# Patient Record
Sex: Female | Born: 1935
Health system: Southern US, Community
[De-identification: ages and names within clinical notes are randomized; demographics above are authoritative.]

## PROBLEM LIST (undated history)

## (undated) DIAGNOSIS — G250 Essential tremor: Secondary | ICD-10-CM

## (undated) DIAGNOSIS — G43909 Migraine, unspecified, not intractable, without status migrainosus: Secondary | ICD-10-CM

## (undated) DIAGNOSIS — F419 Anxiety disorder, unspecified: Secondary | ICD-10-CM

## (undated) DIAGNOSIS — G243 Spasmodic torticollis: Secondary | ICD-10-CM

## (undated) DIAGNOSIS — M5481 Occipital neuralgia: Secondary | ICD-10-CM

## (undated) DIAGNOSIS — R413 Other amnesia: Secondary | ICD-10-CM

## (undated) DIAGNOSIS — K922 Gastrointestinal hemorrhage, unspecified: Secondary | ICD-10-CM

## (undated) HISTORY — PX: REPLACEMENT TOTAL KNEE: SUR1224

## (undated) HISTORY — DX: Occipital neuralgia: M54.81

## (undated) HISTORY — PX: BOWEL RESECTION: SHX1257

## (undated) HISTORY — PX: PACEMAKER IMPLANT: EP1218

## (undated) HISTORY — DX: Spasmodic torticollis: G24.3

## (undated) HISTORY — DX: Other amnesia: R41.3

## (undated) HISTORY — DX: Migraine, unspecified, not intractable, without status migrainosus: G43.909

## (undated) HISTORY — DX: Essential tremor: G25.0

## (undated) HISTORY — DX: Anxiety disorder, unspecified: F41.9

---

## 2011-09-15 DIAGNOSIS — Z8679 Personal history of other diseases of the circulatory system: Secondary | ICD-10-CM | POA: Insufficient documentation

## 2012-03-01 DIAGNOSIS — I10 Essential (primary) hypertension: Secondary | ICD-10-CM | POA: Insufficient documentation

## 2014-11-26 DIAGNOSIS — M25562 Pain in left knee: Secondary | ICD-10-CM | POA: Insufficient documentation

## 2014-11-26 DIAGNOSIS — G8929 Other chronic pain: Secondary | ICD-10-CM | POA: Insufficient documentation

## 2014-12-27 HISTORY — PX: CARDIAC CATHETERIZATION: SHX172

## 2016-09-29 DIAGNOSIS — M722 Plantar fascial fibromatosis: Secondary | ICD-10-CM | POA: Insufficient documentation

## 2016-11-30 DIAGNOSIS — N183 Chronic kidney disease, stage 3 unspecified: Secondary | ICD-10-CM | POA: Insufficient documentation

## 2016-11-30 DIAGNOSIS — K5730 Diverticulosis of large intestine without perforation or abscess without bleeding: Secondary | ICD-10-CM | POA: Insufficient documentation

## 2017-01-19 DIAGNOSIS — M47812 Spondylosis without myelopathy or radiculopathy, cervical region: Secondary | ICD-10-CM | POA: Insufficient documentation

## 2018-06-01 DIAGNOSIS — R251 Tremor, unspecified: Secondary | ICD-10-CM | POA: Insufficient documentation

## 2018-06-01 DIAGNOSIS — M1990 Unspecified osteoarthritis, unspecified site: Secondary | ICD-10-CM | POA: Insufficient documentation

## 2018-07-13 DIAGNOSIS — M542 Cervicalgia: Secondary | ICD-10-CM | POA: Insufficient documentation

## 2018-11-20 DIAGNOSIS — Z96652 Presence of left artificial knee joint: Secondary | ICD-10-CM | POA: Insufficient documentation

## 2019-05-22 DIAGNOSIS — D509 Iron deficiency anemia, unspecified: Secondary | ICD-10-CM | POA: Insufficient documentation

## 2019-10-30 DIAGNOSIS — Z9151 Personal history of suicidal behavior: Secondary | ICD-10-CM | POA: Insufficient documentation

## 2019-10-31 DIAGNOSIS — F323 Major depressive disorder, single episode, severe with psychotic features: Secondary | ICD-10-CM | POA: Insufficient documentation

## 2020-02-04 ENCOUNTER — Other Ambulatory Visit: Payer: Self-pay

## 2020-02-04 ENCOUNTER — Ambulatory Visit (INDEPENDENT_AMBULATORY_CARE_PROVIDER_SITE_OTHER): Payer: Medicare Other | Admitting: Neurology

## 2020-02-04 ENCOUNTER — Encounter: Payer: Self-pay | Admitting: Neurology

## 2020-02-04 VITALS — BP 134/90 | HR 90 | Temp 97.4°F | Ht 64.0 in | Wt 139.0 lb

## 2020-02-04 DIAGNOSIS — R269 Unspecified abnormalities of gait and mobility: Secondary | ICD-10-CM | POA: Insufficient documentation

## 2020-02-04 DIAGNOSIS — R413 Other amnesia: Secondary | ICD-10-CM | POA: Insufficient documentation

## 2020-02-04 DIAGNOSIS — F329 Major depressive disorder, single episode, unspecified: Secondary | ICD-10-CM | POA: Diagnosis not present

## 2020-02-04 DIAGNOSIS — R410 Disorientation, unspecified: Secondary | ICD-10-CM | POA: Diagnosis not present

## 2020-02-04 DIAGNOSIS — G3184 Mild cognitive impairment, so stated: Secondary | ICD-10-CM | POA: Insufficient documentation

## 2020-02-04 DIAGNOSIS — F32A Depression, unspecified: Secondary | ICD-10-CM

## 2020-02-04 MED ORDER — MEMANTINE HCL 10 MG PO TABS: 10.0000 mg | ORAL_TABLET | Freq: Two times a day (BID) | ORAL | 11 refills | Status: DC

## 2020-02-04 NOTE — Progress Notes (Signed)
PATIENT: Regina Hernandez DOB: 1934/08/21  Chief Complaint  Patient presents with  . Establishing neurology care    She is here with her daughter, [name]. MMSE 27/30 - 7 animals. Previously seeing nerurolgy for memory loss, migraines, cervical dystonia, occiptial neuralgia, tremors.  Marland Kitchen PCP    Alma Friendly, MD  . Referred from neurology    Lawrence Marseilles, MD (lives in this area now)     HISTORICAL  Regina Hernandez is a 86 year old female, seen in request by her primary care physician Alma Friendly, accompanied by her daughter [name] for evaluation of constellation of complaints, try to establish neurology care here on February 04, 2020.  I have reviewed and summarized the referring note from the referring physician.  She had past medical history of pacemaker placement.  I was able to review extensive previous records from [facility] neurology by Doctor Lawrence Marseilles, Brooke Bonito, most recent visit was on June 13, 2018, she was seen for occipital neuralgia, migraine, memory loss, cervical dystonia, also tremor, likely to be essential tremor, had occipital nerve block in the past, which was not helpful, started on primidone 50 mg every night, increase to 2 tablets every night, was offered Vicodin 5 325 mg 6 tablets for occipital neuralgia  Patient had longstanding history of depression, lost her husband in 2019, in December 2020, she overdosed herself on 1 bottle of Ambien, was admitted at [facility],  She now lives with her 2 daughters, the most bothersome symptoms for her is memory loss, mildly unsteady gait.  She retired from daycare job, used to enjoying working the yard, was noted to have gradual onset of memory loss since 2019, getting worse since December 2020, she forgets dates, has difficulty operating TV remote control, microwave, and other household appliances, she hopes to receive treatment for her migraine,  She is on polypharmacy treatment, including primidone 50 mg 3 tablets every  night for tremor, Topamax 50 mg 3 times a day for migraine headaches, gabapentin 400 mg 2 tablets 3 times a day for chronic pain, and also Risperdal 2 mg at bedtime, Effexor 75 mg daily, fluvoxamine 50 mg twice a day,   She has a long history of migraine headaches, overall has been improved,    REVIEW OF SYSTEMS: Full 14 system review of systems performed and notable only for as above All other review of systems were negative.  ALLERGIES: Allergies  Allergen Reactions  . Adhesive [Tape] Other (See Comments)    Contact dermatitis  . Lyrica [Pregabalin] Other (See Comments)    Unable to remember reaction.  . Baclofen Rash  . Sulfa Antibiotics Rash    HOME MEDICATIONS: Current Outpatient Medications  Medication Sig Dispense Refill  . ASPIRIN 81 PO Take 1 tablet by mouth daily.    . Cholecalciferol (VITAMIN D3 PO) Take 25 mcg by mouth daily.    Mariane Baumgarten Calcium (STOOL SOFTENER PO) Take by mouth as needed.    Marland Kitchen estradiol (ESTRACE) 0.1 MG/GM vaginal cream Place 1 Applicatorful vaginally as needed.    . fluvoxaMINE (LUVOX) 50 MG tablet Take 50 mg by mouth 2 (two) times daily.    Marland Kitchen gabapentin (NEURONTIN) 400 MG capsule Take 800 mg by mouth 3 (three) times daily.    . meloxicam (MOBIC) 7.5 MG tablet Take 7.5 mg by mouth 2 (two) times daily.    . nitrofurantoin (MACRODANTIN) 100 MG capsule Take 100 mg by mouth at bedtime.    . primidone (MYSOLINE) 50 MG tablet Take 150 mg by  mouth at bedtime.    . risperiDONE (RISPERDAL) 2 MG tablet Take 2 mg by mouth at bedtime.    . sucralfate (CARAFATE) 1 g tablet Take 1 g by mouth 3 (three) times daily. May take up to four times daily.    Marland Kitchen topiramate (TOPAMAX) 50 MG tablet Take 50 mg by mouth 3 (three) times daily.    Marland Kitchen venlafaxine (EFFEXOR) 75 MG tablet Take 75 mg by mouth daily.    . vitamin B-12 (CYANOCOBALAMIN) 1000 MCG tablet Take 1,000 mcg by mouth daily.     No current facility-administered medications for this visit.    PAST MEDICAL  HISTORY: Past Medical History:  Diagnosis Date  . Anxiety   . Cervical dystonia   . Essential tremor   . Memory loss   . Migraine   . Occipital neuralgia     PAST SURGICAL HISTORY: Past Surgical History:  Procedure Laterality Date  . BOWEL RESECTION    . PACEMAKER IMPLANT    . REPLACEMENT TOTAL KNEE Left     FAMILY HISTORY: Family History  Problem Relation Age of Onset  . Other Mother        "old age"  . Ulcers Father   . Stomach cancer Brother     SOCIAL HISTORY: Social History   Socioeconomic History  . Marital status: Widowed    Spouse name: Not on file  . Number of children: 5  . Years of education: 12th  . Highest education level: High school graduate  Occupational History  . Occupation: Retired  Tobacco Use  . Smoking status: Never Smoker  . Smokeless tobacco: Never Used  Substance and Sexual Activity  . Alcohol use: Not Currently  . Drug use: Never  . Sexual activity: Not on file  Other Topics Concern  . Not on file  Social History Narrative   Lives at home with her daughters.   Right-handed.   Two cups caffeine per day.   Social Determinants of Health   Financial Resource Strain:   . Difficulty of Paying Living Expenses: Not on file  Food Insecurity:   . Worried About Charity fundraiser in the Last Year: Not on file  . Ran Out of Food in the Last Year: Not on file  Transportation Needs:   . Lack of Transportation (Medical): Not on file  . Lack of Transportation (Non-Medical): Not on file  Physical Activity:   . Days of Exercise per Week: Not on file  . Minutes of Exercise per Session: Not on file  Stress:   . Feeling of Stress : Not on file  Social Connections:   . Frequency of Communication with Friends and Family: Not on file  . Frequency of Social Gatherings with Friends and Family: Not on file  . Attends Religious Services: Not on file  . Active Member of Clubs or Organizations: Not on file  . Attends Archivist Meetings:  Not on file  . Marital Status: Not on file  Intimate Partner Violence:   . Fear of Current or Ex-Partner: Not on file  . Emotionally Abused: Not on file  . Physically Abused: Not on file  . Sexually Abused: Not on file     PHYSICAL EXAM   Vitals:   02/04/20 1311  BP: 134/90  Pulse: 90  Temp: (!) 97.4 F (36.3 C)  Weight: 139 lb (63 kg)  Height: 5\' 4"  (1.626 m)    Not recorded      Body mass index is  23.86 kg/m.  PHYSICAL EXAMNIATION:  Gen: NAD, conversant, well nourised, well groomed                     Cardiovascular: Regular rate rhythm, no peripheral edema, warm, nontender. Eyes: Conjunctivae clear without exudates or hemorrhage Neck: Supple, no carotid bruits. Pulmonary: Clear to auscultation bilaterally   NEUROLOGICAL EXAM: She has frequent pause, staring during interview MMSE - Mini Mental State Exam 02/04/2020  Orientation to time 4  Orientation to Place 4  Registration 3  Attention/ Calculation 5  Recall 2  Language- name 2 objects 2  Language- repeat 1  Language- follow 3 step command 3  Language- read & follow direction 1  Write a sentence 1  Copy design 1  Total score 27  animal naming 7 CRANIAL NERVES: CN II: Visual fields are full to confrontation. Pupils are round equal and briskly reactive to light. CN III, IV, VI: extraocular movement are normal. No ptosis. CN V: Facial sensation is intact to light touch CN VII: Face is symmetric with normal eye closure  CN VIII: Hearing is normal to causal conversation. CN IX, X: Phonation is normal. CN XI: Head turning and shoulder shrug are intact  MOTOR: There is no pronator drift of out-stretched arms. Muscle bulk and tone are normal. Muscle strength is normal.  REFLEXES: Reflexes are 2+ and symmetric at the biceps, triceps, knees, and ankles. Plantar responses are flexor.  SENSORY: Intact to light touch, pinprick and vibratory sensation are intact in fingers and toes.  COORDINATION: There is no  trunk or limb dysmetria noted.  GAIT/STANCE: She needs push-up to get up from seated position, mildly unsteady,  DIAGNOSTIC DATA (LABS, IMAGING, TESTING) - I reviewed patient records, labs, notes, testing and imaging myself where available.   ASSESSMENT AND PLAN  Regina Hernandez is a 86 y.o. female   Mild cognitive impairment  Mini-Mental Status Examination 27 out of 30  Not MRI candidate due to history of pacemaker, proceed with CT head without contrast  Laboratory evaluation to rule out treatable etiology  Starting Namenda 10 mg twice a day  Gait abnormality  Likely due to combination of aging, deconditioning, polypharmacy treatment, right knee replacement  Essential tremor  Is taking primidone 50 mg 3 tablets every night  Depression anxiety, chronic pain, polypharmacy treatment  She is on fluvoxamine 50 mg twice daily, Effexor 75 mg daily, Risperdal 2 mg at bedtime, gabapentin 400 mg 2 capsule 3 times a day, topiramate 50 mg 1 tablet 3 times a day  I have suggested her taper off Topamax, gabapentin and primidone gradually to avoid the central nervous system side effect     Marcial Pacas, M.D. Ph.D.  [facility] [address], [place], [place] [address] Ph: [phone] Fax: [phone]  CC: Alma Friendly, MD

## 2020-02-05 ENCOUNTER — Telehealth: Payer: Self-pay | Admitting: Neurology

## 2020-02-05 ENCOUNTER — Telehealth: Payer: Self-pay | Admitting: *Deleted

## 2020-02-05 LAB — COMPREHENSIVE METABOLIC PANEL
ALT: 11 IU/L (ref 0–32)
AST: 16 IU/L (ref 0–40)
Albumin/Globulin Ratio: 1.7 (ref 1.2–2.2)
Albumin: 4.3 g/dL (ref 3.6–4.6)
Alkaline Phosphatase: 68 IU/L (ref 39–117)
BUN/Creatinine Ratio: 17 (ref 12–28)
BUN: 17 mg/dL (ref 8–27)
Bilirubin Total: 0.2 mg/dL (ref 0.0–1.2)
CO2: 24 mmol/L (ref 20–29)
Calcium: 10.3 mg/dL (ref 8.7–10.3)
Chloride: 109 mmol/L — ABNORMAL HIGH (ref 96–106)
Creatinine, Ser: 1 mg/dL (ref 0.57–1.00)
GFR calc Af Amer: 60 mL/min/{1.73_m2} (ref >59)
GFR calc non Af Amer: 52 mL/min/{1.73_m2} — ABNORMAL LOW (ref >59)
Globulin, Total: 2.5 g/dL (ref 1.5–4.5)
Glucose: 92 mg/dL (ref 65–99)
Potassium: 4.9 mmol/L (ref 3.5–5.2)
Sodium: 144 mmol/L (ref 134–144)
Total Protein: 6.8 g/dL (ref 6.0–8.5)

## 2020-02-05 LAB — CBC WITH DIFFERENTIAL
Basophils Absolute: 0.1 10*3/uL (ref 0.0–0.2)
Basos: 1 %
EOS (ABSOLUTE): 0.3 10*3/uL (ref 0.0–0.4)
Eos: 4 %
Hematocrit: 39.4 % (ref 34.0–46.6)
Hemoglobin: 12.8 g/dL (ref 11.1–15.9)
Immature Grans (Abs): 0 10*3/uL (ref 0.0–0.1)
Immature Granulocytes: 0 %
Lymphocytes Absolute: 2.8 10*3/uL (ref 0.7–3.1)
Lymphs: 39 %
MCH: 30.3 pg (ref 26.6–33.0)
MCHC: 32.5 g/dL (ref 31.5–35.7)
MCV: 93 fL (ref 79–97)
Monocytes Absolute: 0.6 10*3/uL (ref 0.1–0.9)
Monocytes: 9 %
Neutrophils Absolute: 3.4 10*3/uL (ref 1.4–7.0)
Neutrophils: 47 %
RBC: 4.23 x10E6/uL (ref 3.77–5.28)
RDW: 11.7 % (ref 11.7–15.4)
WBC: 7.2 10*3/uL (ref 3.4–10.8)

## 2020-02-05 LAB — TSH: TSH: 1.71 u[IU]/mL (ref 0.450–4.500)

## 2020-02-05 LAB — RPR: RPR Ser Ql: NONREACTIVE

## 2020-02-05 LAB — VITAMIN B12: Vitamin B-12: 893 pg/mL (ref 232–1245)

## 2020-02-05 NOTE — Telephone Encounter (Signed)
I spoke to her daughter, [name], who verbalized understanding of the lab results.

## 2020-02-05 NOTE — Telephone Encounter (Signed)
Medicare/[place] order sent to GI. No auth they will reach out to the patient to schedule.  

## 2020-02-05 NOTE — Telephone Encounter (Signed)
-----   Message from Marcial Pacas, MD sent at 02/05/2020  1:57 PM EST ----- Please call patient laboratory evaluation showed no significant abnormalities

## 2020-02-06 ENCOUNTER — Ambulatory Visit: Payer: Self-pay | Admitting: Family Medicine

## 2020-02-11 ENCOUNTER — Other Ambulatory Visit: Payer: Self-pay

## 2020-02-11 ENCOUNTER — Encounter: Payer: Self-pay | Admitting: Family Medicine

## 2020-02-11 ENCOUNTER — Ambulatory Visit (INDEPENDENT_AMBULATORY_CARE_PROVIDER_SITE_OTHER): Payer: Medicare Other | Admitting: Family Medicine

## 2020-02-11 VITALS — BP 102/64 | HR 76 | Temp 97.8°F | Ht 64.0 in | Wt 140.8 lb

## 2020-02-11 DIAGNOSIS — N390 Urinary tract infection, site not specified: Secondary | ICD-10-CM | POA: Diagnosis not present

## 2020-02-11 DIAGNOSIS — Z8679 Personal history of other diseases of the circulatory system: Secondary | ICD-10-CM | POA: Diagnosis not present

## 2020-02-11 DIAGNOSIS — Z950 Presence of cardiac pacemaker: Secondary | ICD-10-CM

## 2020-02-11 DIAGNOSIS — F323 Major depressive disorder, single episode, severe with psychotic features: Secondary | ICD-10-CM

## 2020-02-11 DIAGNOSIS — Z7689 Persons encountering health services in other specified circumstances: Secondary | ICD-10-CM | POA: Diagnosis not present

## 2020-02-11 DIAGNOSIS — Z8619 Personal history of other infectious and parasitic diseases: Secondary | ICD-10-CM | POA: Insufficient documentation

## 2020-02-11 DIAGNOSIS — M533 Sacrococcygeal disorders, not elsewhere classified: Secondary | ICD-10-CM | POA: Insufficient documentation

## 2020-02-11 DIAGNOSIS — N1831 Chronic kidney disease, stage 3a: Secondary | ICD-10-CM

## 2020-02-11 DIAGNOSIS — R42 Dizziness and giddiness: Secondary | ICD-10-CM | POA: Insufficient documentation

## 2020-02-11 DIAGNOSIS — R519 Headache, unspecified: Secondary | ICD-10-CM | POA: Insufficient documentation

## 2020-02-11 DIAGNOSIS — G930 Cerebral cysts: Secondary | ICD-10-CM | POA: Insufficient documentation

## 2020-02-11 DIAGNOSIS — G43909 Migraine, unspecified, not intractable, without status migrainosus: Secondary | ICD-10-CM | POA: Insufficient documentation

## 2020-02-11 NOTE — Patient Instructions (Signed)
Good to see you today  Please follow up in 6 months  You will get a call about urology and cardiology appointments

## 2020-02-11 NOTE — Progress Notes (Signed)
Subjective:    Patient ID: Regina Hernandez, female    DOB: December 14, 1934, 86 y.o.   MRN: [id number]  HPI Chief Complaint  Patient presents with  . New Patient (Initial Visit)    Establish Care   This is an 86 yo female who presents today to establish care. She is accompanied by her daughter. Two of her daughters manage her care. She lives with her other daughter. Husband passed away last year. Enjoys puzzles, watching TV.   Last CPE- 12/11/19 Mammo- [id number] Pap- Not applicable Colonoscopy- will get records Tdap-09/23/2018 Flu- annual Pneumonia- reports that she has had, will check pcp records Exercise- walks on deck when weather is nice.   Has history of bowel resection. Feels like food is not fully digested. Usually has daily BM with daily stool softener and 2 cups of coffee. Also supplements with Boost. Bowel movements hard. Drinks 3-4 glasses of water daily.   Stage 4 kidney disease.   Neurology- recent visit with Dr. Krista Blue. Headaches- mostly sinus headaches, some migraine headache. Good relief with Dristan Sinus med at night.   Mood is improved. She is seeing Dr. Casimiro Needle psychiatrist.    Review of Systems  Constitutional: Negative.   HENT: Negative.   Respiratory: Negative.   Cardiovascular: Positive for leg swelling (if she doesn't elevate legs while sitting).  Musculoskeletal: Positive for gait problem (uses cane).       Left knee pain.   Neurological: Positive for headaches (chronic). Negative for weakness.  Psychiatric/Behavioral: Positive for dysphoric mood (improving).       Objective:   Physical Exam Vitals reviewed.  Constitutional:      General: She is not in acute distress.    Appearance: Normal appearance. She is normal weight. She is not ill-appearing, toxic-appearing or diaphoretic.  HENT:     Head: Normocephalic and atraumatic.  Eyes:     Conjunctiva/sclera: Conjunctivae normal.  Cardiovascular:     Rate and Rhythm: Normal rate and regular rhythm.   Heart sounds: Normal heart sounds.  Pulmonary:     Effort: Pulmonary effort is normal.     Breath sounds: Normal breath sounds.  Musculoskeletal:     Right lower leg: Edema (trace) present.     Left lower leg: Edema (trace) present.  Skin:    General: Skin is warm and dry.  Neurological:     Mental Status: She is alert.     Comments: Normally conversive, answers questions appropriately.   Psychiatric:        Mood and Affect: Mood normal.        Behavior: Behavior normal.        Thought Content: Thought content normal.        Judgment: Judgment normal.       BP 102/64 (BP Location: Left Arm, Patient Position: Sitting, Cuff Size: Normal)   Pulse 76   Temp 97.8 F (36.6 C) (Temporal)   Ht 5\' 4"  (1.626 m)   Wt 140 lb 12.8 oz (63.9 kg)   SpO2 96%   BMI 24.17 kg/m  Wt Readings from Last 3 Encounters:  02/11/20 140 lb 12.8 oz (63.9 kg)  02/04/20 139 lb (63 kg)   Depression screen PHQ 2/9 02/11/2020  Decreased Interest 0  Down, Depressed, Hopeless 0  PHQ - 2 Score 0       Assessment & Plan:  1. Encounter to establish care - EMR reviewed   2. Frequent UTI - patient previously seen by urology, requests referral to local urologist -  Ambulatory referral to Urology  3. Artificial cardiac pacemaker - previously saw cardiologist in [place], requests referral to local cardiologist - Ambulatory referral to Cardiology  4. History of sick sinus syndrome - Ambulatory referral to Cardiology  5. Major depression with psychotic features [facility]) - currently following with psychiatry, reports that her mood has improved - good family support  6. Stage 3a chronic kidney disease - stable - Meloxicam on her medication list, encouraged her to discontinue daily use and try Tylenol instead  - follow up in 6 months  This visit occurred during the SARS-CoV-2 public health emergency.  Safety protocols were in place, including screening questions prior to the visit, additional usage  of staff PPE, and extensive cleaning of exam room while observing appropriate contact time as indicated for disinfecting solutions.    Clarene Reamer, FNP-BC  [facility], [facility]  02/11/2020 10:14 AM

## 2020-02-15 ENCOUNTER — Ambulatory Visit
Admission: RE | Admit: 2020-02-15 | Discharge: 2020-02-15 | Disposition: A | Discharge status: Home / Self Care | Payer: Medicare Other | Source: Ambulatory Visit | Attending: Neurology | Admitting: Neurology

## 2020-02-15 DIAGNOSIS — R269 Unspecified abnormalities of gait and mobility: Secondary | ICD-10-CM

## 2020-02-15 DIAGNOSIS — F329 Major depressive disorder, single episode, unspecified: Secondary | ICD-10-CM

## 2020-02-15 DIAGNOSIS — F32A Depression, unspecified: Secondary | ICD-10-CM

## 2020-02-15 DIAGNOSIS — R413 Other amnesia: Secondary | ICD-10-CM | POA: Diagnosis not present

## 2020-02-18 ENCOUNTER — Encounter: Payer: Self-pay | Admitting: Cardiology

## 2020-02-18 ENCOUNTER — Ambulatory Visit (INDEPENDENT_AMBULATORY_CARE_PROVIDER_SITE_OTHER): Payer: Medicare Other | Admitting: Cardiology

## 2020-02-18 ENCOUNTER — Telehealth: Payer: Self-pay | Admitting: Neurology

## 2020-02-18 ENCOUNTER — Other Ambulatory Visit: Payer: Self-pay

## 2020-02-18 VITALS — BP 172/92 | HR 71 | Ht 63.0 in | Wt 137.0 lb

## 2020-02-18 DIAGNOSIS — R030 Elevated blood-pressure reading, without diagnosis of hypertension: Secondary | ICD-10-CM

## 2020-02-18 DIAGNOSIS — Z8679 Personal history of other diseases of the circulatory system: Secondary | ICD-10-CM

## 2020-02-18 MED ORDER — PRIMIDONE 50 MG PO TABS: 150.0000 mg | ORAL_TABLET | Freq: Every day | ORAL | 1 refills | Status: DC

## 2020-02-18 NOTE — Progress Notes (Signed)
Cardiology Office Note:    Date:  02/18/2020   ID:  Regina Hernandez, Regina Hernandez 01-13-1934, MRN [id number]  PCP:  Elby Beck, FNP  Cardiologist:  Kate Sable, MD  Electrophysiologist:  None   Referring MD: Elby Beck, FNP   Chief Complaint  Patient presents with  . office visit    pt has no complaints. Meds verbally reviewed w/ pt.    History of Present Illness:    Regina Hernandez is a 86 y.o. 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  She was previously seen at [facility] cardiology in [place].  Patient recently moved to the area from [place].  She denies any symptoms of chest pain or shortness of breath at rest or with exertion.  She is able to do all activities of daily living without chest pain or breathing problems.  She ambulates with a walker.  She states having a history of cardiac valve leakage.  Her pacemaker was placed roughly 7 years ago.  Pacemaker lastly interrogated on 01/16/2020, report states pacemaker was functioning appropriately.  Past Medical History:  Diagnosis Date  . Anxiety   . Cervical dystonia   . Essential tremor   . Memory loss   . Migraine   . Occipital neuralgia     Past Surgical History:  Procedure Laterality Date  . BOWEL RESECTION    . PACEMAKER IMPLANT    . REPLACEMENT TOTAL KNEE Left     Current Medications: Current Meds  Medication Sig  . ASPIRIN 81 PO Take 1 tablet by mouth daily.  . Cholecalciferol (VITAMIN D3 PO) Take 25 mcg by mouth daily.  Mariane Baumgarten Calcium (STOOL SOFTENER PO) Take by mouth as needed.  Marland Kitchen estradiol (ESTRACE) 0.1 MG/GM vaginal cream Place 1 Applicatorful vaginally as needed.  . fluvoxaMINE (LUVOX) 50 MG tablet Take 50 mg by mouth 2 (two) times daily.  Marland Kitchen gabapentin (NEURONTIN) 400 MG capsule Take 800 mg by mouth 3 (three) times daily.  . meloxicam (MOBIC) 7.5 MG tablet Take 7.5 mg by mouth 2 (two)  times daily.  . memantine (NAMENDA) 10 MG tablet Take 1 tablet (10 mg total) by mouth 2 (two) times daily.  . nitrofurantoin (MACRODANTIN) 100 MG capsule Take 100 mg by mouth at bedtime.  . primidone (MYSOLINE) 50 MG tablet Take 3 tablets (150 mg total) by mouth at bedtime.  . risperiDONE (RISPERDAL) 2 MG tablet Take 2 mg by mouth at bedtime.  . sucralfate (CARAFATE) 1 g tablet Take 1 g by mouth 3 (three) times daily. May take up to four times daily.  Marland Kitchen topiramate (TOPAMAX) 50 MG tablet Take 50 mg by mouth 3 (three) times daily.  Marland Kitchen venlafaxine (EFFEXOR) 75 MG tablet Take 75 mg by mouth daily.  . vitamin B-12 (CYANOCOBALAMIN) 1000 MCG tablet Take 1,000 mcg by mouth daily.     Allergies:   Adhesive [tape], Lyrica [pregabalin], Baclofen, and Sulfa antibiotics   Social History   Socioeconomic History  . Marital status: Widowed    Spouse name: Not on file  . Number of children: 5  . Years of education: 12th  . Highest education level: High school graduate  Occupational History  . Occupation: Retired  Tobacco Use  . Smoking status: Never Smoker  . Smokeless tobacco: Never Used  Substance and Sexual Activity  . Alcohol use: Not Currently  . Drug use: Never  . Sexual activity: Not on file  Other  Topics Concern  . Not on file  Social History Narrative   Lives at home with her daughters.   Right-handed.   Two cups caffeine per day.   Social Determinants of Health   Financial Resource Strain:   . Difficulty of Paying Living Expenses: Not on file  Food Insecurity:   . Worried About Charity fundraiser in the Last Year: Not on file  . Ran Out of Food in the Last Year: Not on file  Transportation Needs:   . Lack of Transportation (Medical): Not on file  . Lack of Transportation (Non-Medical): Not on file  Physical Activity:   . Days of Exercise per Week: Not on file  . Minutes of Exercise per Session: Not on file  Stress:   . Feeling of Stress : Not on file  Social Connections:    . Frequency of Communication with Friends and Family: Not on file  . Frequency of Social Gatherings with Friends and Family: Not on file  . Attends Religious Services: Not on file  . Active Member of Clubs or Organizations: Not on file  . Attends Archivist Meetings: Not on file  . Marital Status: Not on file     Family History: The patient's family history includes Other in her mother; Stomach cancer in her brother; Ulcers in her father.  ROS:   Please see the history of present illness.     All other systems reviewed and are negative.  EKGs/Labs/Other Studies Reviewed:    The following studies were reviewed today:   EKG:  EKG is  ordered today.  The ekg ordered today demonstrates atrial paced rhythm  Recent Labs: 02/04/2020: ALT 11; BUN 17; Creatinine, Ser 1.00; Hemoglobin 12.8; Potassium 4.9; Sodium 144; TSH 1.710  Recent Lipid Panel No results found for: CHOL, TRIG, HDL, CHOLHDL, VLDL, LDLCALC, LDLDIRECT  Physical Exam:    VS:  BP (!) 172/92 (BP Location: Left Arm, Patient Position: Sitting, Cuff Size: Normal)   Pulse 71   Ht 5\' 3"  (1.6 m)   Wt 137 lb (62.1 kg)   SpO2 97%   BMI 24.27 kg/m     Wt Readings from Last 3 Encounters:  02/18/20 137 lb (62.1 kg)  02/11/20 140 lb 12.8 oz (63.9 kg)  02/04/20 139 lb (63 kg)     GEN:  Well nourished, well developed in no acute distress HEENT: Normal NECK: No JVD; No carotid bruits LYMPHATICS: No lymphadenopathy CARDIAC: RRR, no murmurs, rubs, gallops RESPIRATORY:  Clear to auscultation without rales, wheezing or rhonchi  ABDOMEN: Soft, non-tender, non-distended MUSCULOSKELETAL:  No edema; No deformity  SKIN: Warm and dry NEUROLOGIC:  Alert and oriented x 3 PSYCHIATRIC:  Normal affect   ASSESSMENT:    1. History of sick sinus syndrome   2. Elevated blood pressure reading    PLAN:    In order of problems listed above:  1. Patient with history of bradycardia, sick sinus syndrome status post pacemaker.   We will schedule device clinic for frequent pacemaker checks.  Get echocardiogram to evaluate function in light of history of valvular regurgitation. 2. Blood pressure appears elevated today.  Blood pressure is usually normal at home.  Last blood pressure checks during clinical visits have been normal.  Continue to monitor for now.  Follow-up after echocardiogram.  This note was generated in part or whole with voice recognition software. Voice recognition is usually quite accurate but there are transcription errors that can and very often do occur. I  apologize for any typographical errors that were not detected and corrected.  Medication Adjustments/Labs and Tests Ordered: Current medicines are reviewed at length with the patient today.  Concerns regarding medicines are outlined above.  Orders Placed This Encounter  Procedures  . Ambulatory referral to Cardiac Electrophysiology  . EKG 12-Lead  . ECHOCARDIOGRAM COMPLETE   No orders of the defined types were placed in this encounter.   Patient Instructions  Medication Instructions:  Your physician recommends that you continue on your current medications as directed. Please refer to the Current Medication list given to you today.  *If you need a refill on your cardiac medications before your next appointment, please call your pharmacy*  Lab Work: None ordered If you have labs (blood work) drawn today and your tests are completely normal, you will receive your results only by: Marland Kitchen MyChart Message (if you have MyChart) OR . A paper copy in the mail If you have any lab test that is abnormal or we need to change your treatment, we will call you to review the results.  Testing/Procedures: Your physician has requested that you have an echocardiogram. Echocardiography is a painless test that uses sound waves to create images of your heart. It provides your doctor with information about the size and shape of your heart and how well your heart's  chambers and valves are working. This procedure takes approximately one hour. There are no restrictions for this procedure.    Follow-Up: At [facility], you and your health needs are our priority.  As part of our continuing mission to provide you with exceptional heart care, we have created designated Provider Care Teams.  These Care Teams include your primary Cardiologist (physician) and Advanced Practice Providers (APPs -  Physician Assistants and Nurse Practitioners) who all work together to provide you with the care you need, when you need it.  Your next appointment:   After the echo with Dr. Brett Canales have been referred to EP device clinic   The format for your next appointment:   In Person  Provider:    You may see Kate Sable, MD or one of the following Advanced Practice Providers on your designated Care Team:    Murray Hodgkins, NP  Christell Faith, PA-C  Marrianne Mood, PA-C   Other Instructions  Echocardiogram An echocardiogram is a procedure that uses painless sound waves (ultrasound) to produce an image of the heart. Images from an echocardiogram can provide important information about:  Signs of coronary artery disease (CAD).  Aneurysm detection. An aneurysm is a weak or damaged part of an artery wall that bulges out from the normal force of blood pumping through the body.  Heart size and shape. Changes in the size or shape of the heart can be associated with certain conditions, including heart failure, aneurysm, and CAD.  Heart muscle function.  Heart valve function.  Signs of a past heart attack.  Fluid buildup around the heart.  Thickening of the heart muscle.  A tumor or infectious growth around the heart valves. Tell a health care provider about:  Any allergies you have.  All medicines you are taking, including vitamins, herbs, eye drops, creams, and over-the-counter medicines.  Any blood disorders you have.  Any surgeries you  have had.  Any medical conditions you have.  Whether you are pregnant or may be pregnant. What are the risks? Generally, this is a safe procedure. However, problems may occur, including:  Allergic reaction to dye (contrast) that may be  used during the procedure. What happens before the procedure? No specific preparation is needed. You may eat and drink normally. What happens during the procedure?   An IV tube may be inserted into one of your veins.  You may receive contrast through this tube. A contrast is an injection that improves the quality of the pictures from your heart.  A gel will be applied to your chest.  A wand-like tool (transducer) will be moved over your chest. The gel will help to transmit the sound waves from the transducer.  The sound waves will harmlessly bounce off of your heart to allow the heart images to be captured in real-time motion. The images will be recorded on a computer. The procedure may vary among health care providers and hospitals. What happens after the procedure?  You may return to your normal, everyday life, including diet, activities, and medicines, unless your health care provider tells you not to do that. Summary  An echocardiogram is a procedure that uses painless sound waves (ultrasound) to produce an image of the heart.  Images from an echocardiogram can provide important information about the size and shape of your heart, heart muscle function, heart valve function, and fluid buildup around your heart.  You do not need to do anything to prepare before this procedure. You may eat and drink normally.  After the echocardiogram is completed, you may return to your normal, everyday life, unless your health care provider tells you not to do that. This information is not intended to replace advice given to you by your health care provider. Make sure you discuss any questions you have with your health care provider. Document Revised: 04/05/2019  Document Reviewed: 01/15/2017 Elsevier Patient Education  2020 Botkins, Kate Sable, MD  02/18/2020 12:50 PM    [place]

## 2020-02-18 NOTE — Telephone Encounter (Signed)
Pt has appt pending on 04/22/20.  Order placed for refills.

## 2020-02-18 NOTE — Telephone Encounter (Signed)
I spoke to her daughter, Maurine Minister, on [place]. She verbalized understanding of the CT Head results and will share the information with her mother. Provided our number to call back with any questions.

## 2020-02-18 NOTE — Telephone Encounter (Signed)
Regina Hernandez, Regina Hernandez(daughter) has called for a refill for primidone (MYSOLINE) 50 MG tablet  [facility] #[id number]  For pt.  She is asking for 2 month of refills to hold pt until her April f/u appointment

## 2020-02-18 NOTE — Patient Instructions (Signed)
Medication Instructions:  Your physician recommends that you continue on your current medications as directed. Please refer to the Current Medication list given to you today.  *If you need a refill on your cardiac medications before your next appointment, please call your pharmacy*  Lab Work: None ordered If you have labs (blood work) drawn today and your tests are completely normal, you will receive your results only by: Marland Kitchen MyChart Message (if you have MyChart) OR . A paper copy in the mail If you have any lab test that is abnormal or we need to change your treatment, we will call you to review the results.  Testing/Procedures: Your physician has requested that you have an echocardiogram. Echocardiography is a painless test that uses sound waves to create images of your heart. It provides your doctor with information about the size and shape of your heart and how well your heart's chambers and valves are working. This procedure takes approximately one hour. There are no restrictions for this procedure.    Follow-Up: At [facility], you and your health needs are our priority.  As part of our continuing mission to provide you with exceptional heart care, we have created designated Provider Care Teams.  These Care Teams include your primary Cardiologist (physician) and Advanced Practice Providers (APPs -  Physician Assistants and Nurse Practitioners) who all work together to provide you with the care you need, when you need it.  Your next appointment:   After the echo with Dr. Brett Canales have been referred to EP device clinic   The format for your next appointment:   In Person  Provider:    You may see Kate Sable, MD or one of the following Advanced Practice Providers on your designated Care Team:    Murray Hodgkins, NP  Christell Faith, PA-C  Marrianne Mood, PA-C   Other Instructions  Echocardiogram An echocardiogram is a procedure that uses painless sound waves  (ultrasound) to produce an image of the heart. Images from an echocardiogram can provide important information about:  Signs of coronary artery disease (CAD).  Aneurysm detection. An aneurysm is a weak or damaged part of an artery wall that bulges out from the normal force of blood pumping through the body.  Heart size and shape. Changes in the size or shape of the heart can be associated with certain conditions, including heart failure, aneurysm, and CAD.  Heart muscle function.  Heart valve function.  Signs of a past heart attack.  Fluid buildup around the heart.  Thickening of the heart muscle.  A tumor or infectious growth around the heart valves. Tell a health care provider about:  Any allergies you have.  All medicines you are taking, including vitamins, herbs, eye drops, creams, and over-the-counter medicines.  Any blood disorders you have.  Any surgeries you have had.  Any medical conditions you have.  Whether you are pregnant or may be pregnant. What are the risks? Generally, this is a safe procedure. However, problems may occur, including:  Allergic reaction to dye (contrast) that may be used during the procedure. What happens before the procedure? No specific preparation is needed. You may eat and drink normally. What happens during the procedure?   An IV tube may be inserted into one of your veins.  You may receive contrast through this tube. A contrast is an injection that improves the quality of the pictures from your heart.  A gel will be applied to your chest.  A wand-like tool (transducer)  will be moved over your chest. The gel will help to transmit the sound waves from the transducer.  The sound waves will harmlessly bounce off of your heart to allow the heart images to be captured in real-time motion. The images will be recorded on a computer. The procedure may vary among health care providers and hospitals. What happens after the  procedure?  You may return to your normal, everyday life, including diet, activities, and medicines, unless your health care provider tells you not to do that. Summary  An echocardiogram is a procedure that uses painless sound waves (ultrasound) to produce an image of the heart.  Images from an echocardiogram can provide important information about the size and shape of your heart, heart muscle function, heart valve function, and fluid buildup around your heart.  You do not need to do anything to prepare before this procedure. You may eat and drink normally.  After the echocardiogram is completed, you may return to your normal, everyday life, unless your health care provider tells you not to do that. This information is not intended to replace advice given to you by your health care provider. Make sure you discuss any questions you have with your health care provider. Document Revised: 04/05/2019 Document Reviewed: 01/15/2017 Elsevier Patient Education  [place].

## 2020-02-18 NOTE — Telephone Encounter (Signed)
IMPRESSION: This CT scan of the head without contrast shows the following: 1.   Mild generalized cortical atrophy, typical for age. 2.   Mild deep white matter changes consistent with mild age-appropriate chronic microvascular ischemic changes. 3.   There is a left anterior temporal fossa arachnoid cyst. 4.   Dolichoectasia of the vertebrobasilar system.  5.   No acute findings.   CT head showed no acute abnormality, there is evidence of mild generalized atrophy, supratentorium small vessel disease, in addition, there is a left anterior temporal fossa arachnoid cyst (which is chronic, and benign findings), will review CT scan at next follow-up visit

## 2020-02-18 NOTE — Addendum Note (Signed)
Addended by: Noberto Retort C on: 02/18/2020 12:00 PM   Modules accepted: Orders

## 2020-02-20 ENCOUNTER — Other Ambulatory Visit: Payer: Self-pay

## 2020-02-20 ENCOUNTER — Ambulatory Visit (INDEPENDENT_AMBULATORY_CARE_PROVIDER_SITE_OTHER): Payer: Medicare Other | Admitting: Neurology

## 2020-02-20 DIAGNOSIS — R410 Disorientation, unspecified: Secondary | ICD-10-CM

## 2020-02-20 DIAGNOSIS — R413 Other amnesia: Secondary | ICD-10-CM

## 2020-02-20 DIAGNOSIS — F32A Depression, unspecified: Secondary | ICD-10-CM

## 2020-02-20 DIAGNOSIS — F329 Major depressive disorder, single episode, unspecified: Secondary | ICD-10-CM

## 2020-02-20 DIAGNOSIS — R269 Unspecified abnormalities of gait and mobility: Secondary | ICD-10-CM

## 2020-02-26 ENCOUNTER — Other Ambulatory Visit: Payer: Self-pay | Admitting: Neurology

## 2020-02-29 ENCOUNTER — Other Ambulatory Visit: Payer: Self-pay

## 2020-02-29 ENCOUNTER — Encounter: Payer: Self-pay | Admitting: Urology

## 2020-02-29 ENCOUNTER — Ambulatory Visit (INDEPENDENT_AMBULATORY_CARE_PROVIDER_SITE_OTHER): Payer: Medicare Other | Admitting: Urology

## 2020-02-29 VITALS — BP 166/101 | HR 94 | Ht 63.0 in | Wt 133.0 lb

## 2020-02-29 DIAGNOSIS — N390 Urinary tract infection, site not specified: Secondary | ICD-10-CM

## 2020-02-29 LAB — MICROSCOPIC EXAMINATION

## 2020-02-29 LAB — URINALYSIS, COMPLETE
Bilirubin, UA: NEGATIVE
Glucose, UA: NEGATIVE
Ketones, UA: NEGATIVE
Leukocytes,UA: NEGATIVE
Nitrite, UA: NEGATIVE
Protein,UA: NEGATIVE
Specific Gravity, UA: 1.02 (ref 1.005–1.030)
Urobilinogen, Ur: 0.2 mg/dL (ref 0.2–1.0)
pH, UA: 7 (ref 5.0–7.5)

## 2020-02-29 LAB — BLADDER SCAN AMB NON-IMAGING

## 2020-02-29 MED ORDER — NITROFURANTOIN MACROCRYSTAL 50 MG PO CAPS: 50.0000 mg | ORAL_CAPSULE | Freq: Every day | ORAL | 0 refills | Status: AC

## 2020-02-29 NOTE — Progress Notes (Signed)
02/29/2020 9:42 AM   Regina Hernandez 1934-10-11 [id number]  Referring provider: Elby Beck, [place],  [place] [address]  Chief Complaint  Patient presents with  . Recurrent UTI    HPI: She has a history of recurrent UTI consisting of symptoms of dysuria and bladder pain. She saw Dr. Henreitta Leber. She has no gross hematuria. She gets about 2 UTI per year. Drinking water. She has CKD Stage III. Last Cr was 1 and GFR 52. She was on TMP and now on nightly NF 100 mg. On estradiol vaginal cream.   She has not had any recent issues.  She needed to establish with a new urologist. She has constipation but on bowel regimen. No NG risk.   PVR was 30 ml.    PMH: Past Medical History:  Diagnosis Date  . Anxiety   . Cervical dystonia   . Essential tremor   . Memory loss   . Migraine   . Occipital neuralgia     Surgical History: Past Surgical History:  Procedure Laterality Date  . BOWEL RESECTION    . PACEMAKER IMPLANT    . REPLACEMENT TOTAL KNEE Left     Home Medications:  Allergies as of 02/29/2020      Reactions   Adhesive [tape] Other (See Comments)   Contact dermatitis   Lyrica [pregabalin] Other (See Comments)   Unable to remember reaction.   Baclofen Rash   Sulfa Antibiotics Rash      Medication List       Accurate as of February 29, 2020  9:42 AM. If you have any questions, ask your nurse or doctor.        STOP taking these medications   topiramate 50 MG tablet Commonly known as: TOPAMAX Stopped by: Festus Aloe, MD     TAKE these medications   ASPIRIN 81 PO Take 1 tablet by mouth daily.   estradiol 0.1 MG/GM vaginal cream Commonly known as: ESTRACE Place 1 Applicatorful vaginally as needed.   fluvoxaMINE 50 MG tablet Commonly known as: LUVOX Take 50 mg by mouth 2 (two) times daily.   gabapentin 400 MG capsule Commonly known as: NEURONTIN Take 800 mg by mouth 3 (three) times daily.   meloxicam 7.5 MG tablet Commonly  known as: MOBIC Take 7.5 mg by mouth 2 (two) times daily.   memantine 10 MG tablet Commonly known as: NAMENDA TAKE 1 TABLET BY MOUTH TWICE A DAY   nitrofurantoin 100 MG capsule Commonly known as: MACRODANTIN Take 100 mg by mouth at bedtime.   primidone 50 MG tablet Commonly known as: MYSOLINE Take 3 tablets (150 mg total) by mouth at bedtime.   risperiDONE 2 MG tablet Commonly known as: RISPERDAL Take 2 mg by mouth at bedtime.   STOOL SOFTENER PO Take by mouth as needed.   sucralfate 1 g tablet Commonly known as: CARAFATE Take 1 g by mouth 3 (three) times daily. May take up to four times daily.   venlafaxine 75 MG tablet Commonly known as: EFFEXOR Take 75 mg by mouth daily.   vitamin B-12 1000 MCG tablet Commonly known as: CYANOCOBALAMIN Take 1,000 mcg by mouth daily.   VITAMIN D3 PO Take 25 mcg by mouth daily.       Allergies:  Allergies  Allergen Reactions  . Adhesive [Tape] Other (See Comments)    Contact dermatitis  . Lyrica [Pregabalin] Other (See Comments)    Unable to remember reaction.  . Baclofen Rash  .  Sulfa Antibiotics Rash    Family History: Family History  Problem Relation Age of Onset  . Other Mother        "old age"  . Ulcers Father   . Stomach cancer Brother     Social History:  reports that she has never smoked. She has never used smokeless tobacco. She reports previous alcohol use. She reports that she does not use drugs.   Physical Exam: BP (!) 166/101   Pulse 94   Ht 5\' 3"  (1.6 m)   Wt 133 lb (60.3 kg)   BMI 23.56 kg/m   Constitutional:  Alert and oriented, No acute distress. HEENT: [place] AT, moist mucus membranes.  Trachea midline, no masses. Cardiovascular: No clubbing, cyanosis, or edema. Respiratory: Normal respiratory effort, no increased work of breathing. GI: Abdomen is soft, nontender, nondistended, no abdominal masses Skin: No rashes, bruises or suspicious lesions. Neurologic: Grossly intact, no focal deficits,  moving all 4 extremities. Psychiatric: Normal mood and affect.  Laboratory Data: Lab Results  Component Value Date   WBC 7.2 02/04/2020   HGB 12.8 02/04/2020   HCT 39.4 02/04/2020   MCV 93 02/04/2020    Lab Results  Component Value Date   CREATININE 1.00 02/04/2020    No results found for: PSA  No results found for: TESTOSTERONE  No results found for: HGBA1C  Urinalysis No results found for: COLORURINE, APPEARANCEUR, LABSPEC, PHURINE, GLUCOSEU, HGBUR, BILIRUBINUR, KETONESUR, PROTEINUR, UROBILINOGEN, NITRITE, LEUKOCYTESUR  No results found for: LABMICR, [place], RBCUA, LABEPIT, MUCUS, BACTERIA  Pertinent Imaging: n/a No results found for this or any previous visit. No results found for this or any previous visit. No results found for this or any previous visit. No results found for this or any previous visit. No results found for this or any previous visit. No results found for this or any previous visit. No results found for this or any previous visit. No results found for this or any previous visit.  Assessment & Plan:    1. Recurrent UTI -discussed with patient and family bacteria in the urine is very common.  Asymptomatic bacteriuria even with a positive culture does not need treatment. Only use antibiotics for symptomatic (bladder pain, dysuria) bacteriuria with a positive culture.  - Bladder Scan (Post Void Residual) in office - Urinalysis, Complete   No follow-ups on file.  Festus Aloe, MD  [facility] [address], [place],  [address] [phone]

## 2020-02-29 NOTE — Patient Instructions (Signed)
Asymptomatic Bacteriuria  Asymptomatic bacteriuria is the presence of a large number of bacteria in the urine without the usual symptoms of burning or frequent urination. What are the causes? This condition is caused by an increase in bacteria in the urine. This increase can be caused by:  Bacteria entering the urinary tract, such as during sex.  A blockage in the urinary tract, such as from kidney stones or a tumor.  Bladder problems that prevent the bladder from emptying. What increases the risk? You are more likely to develop this condition if:  You have diabetes mellitus.  You are an elderly adult, especially if you are also in a long-term care facility.  You are pregnant and in the first trimester.  You have kidney stones.  You are female.  You have had a kidney transplant.  You have a leaky kidney tube valve (reflux).  You had a urinary catheter for a long period of time. What are the signs or symptoms? There are no symptoms of this condition. How is this diagnosed? This condition is diagnosed with a urine test. Because this condition does not cause symptoms, it is usually diagnosed when a urine sample is taken to treat or diagnose another condition, such as pregnancy or kidney problems. Most women who are in their first trimester of pregnancy are screened for asymptomatic bacteriuria. How is this treated? Usually, treatment is not needed for this condition. Treating the condition can lead to other problems, such as a yeast infection or the growth of bacteria that do not respond to treatment (antibiotic-resistant bacteria). Some people, such as pregnant women and people with kidney transplants, do need treatment with antibiotic medicines to prevent kidney infection (pyelonephritis). In pregnant women, kidney infection can lead to premature labor, fetal growth restriction, or newborn death. Follow these instructions at home: Medicines  Take over-the-counter and prescription  medicines only as told by your health care provider.  If you were prescribed an antibiotic medicine, take it as told by your health care provider. Do not stop taking the antibiotic even if you start to feel better. General instructions  Monitor your condition for any changes.  Drink enough fluid to keep your urine clear or pale yellow.  Go to the bathroom more often to keep your bladder empty.  If you are female, keep the area around your vagina and rectum clean. Wipe yourself from front to back after urinating.  Keep all follow-up visits as told by your health care provider. This is important. Contact a health care provider if:  You notice any new symptoms, such as back pain or burning while urinating. Get help right away if:  You develop signs of an infection such as: ? A burning sensation when you urinate. ? Have pain when you urinate. ? Develop an intense need to urinate. ? Urinating more frequently. ? Back pain or pelvic pain. ? Fever or chills.  You have blood in your urine.  Your urine becomes discolored or cloudy.  Your urine smells bad.  You have severe pain that cannot be controlled with medicine. Summary  Asymptomatic bacteriuria is the presence of a large number of bacteria in the urine without the usual symptoms of burning or frequent urination.  Usually, treatment is not needed for this condition. Treating the condition can lead to other problems, such as too much yeast and the growth of antibiotic-resistant bacteria.  Some people, such as pregnant women and people with kidney transplants, do need treatment with antibiotic medicines to prevent kidney   infection (pyelonephritis).  If you were prescribed an antibiotic medicine, take it as told by your health care provider. Do not stop taking the antibiotic even if you start to feel better. This information is not intended to replace advice given to you by your health care provider. Make sure you discuss any  questions you have with your health care provider. Document Revised: 04/03/2019 Document Reviewed: 12/07/2016 Elsevier Patient Education  2020 Elsevier Inc.  

## 2020-03-06 NOTE — Procedures (Signed)
   HISTORY: 86 year old female had quick decline of her cognitive function.  TECHNIQUE:  This is a routine 16 channel EEG recording with one channel devoted to a limited EKG recording.  It was performed during wakefulness, drowsiness and asleep.  Hyperventilation and photic stimulation were performed as activating procedures.  There are minimum muscle and movement artifact noted.  Upon maximum arousal, posterior dominant waking rhythm consistent of dysrhythmic low amplitude theta range activity, with frequency of 7 hz. Activities are symmetric over the bilateral posterior derivations and attenuated with eye opening.  Hyperventilation produced mild/moderate buildup with higher amplitude and the slower activities noted.  Photic stimulation did not alter the tracing.  During EEG recording, patient developed drowsiness and no deeper stage of sleep was achieved During EEG recording, there was no epileptiform discharge noted.  EKG demonstrate sinus rhythm, with heart rate of 66 bpm  CONCLUSION: This is an abnormal EEG.  There is electrodiagnostic evidence of generalized background slowing, consistent with mild bihemispheric malfunction, common etiology metabolic toxic, and central nervous system degenerative disorder.  Marcial Pacas, M.D. Ph.D.  [facility] [place], [place] [address] Phone: [phone] Fax:      [phone]

## 2020-03-07 ENCOUNTER — Other Ambulatory Visit: Payer: Self-pay

## 2020-03-07 ENCOUNTER — Ambulatory Visit (INDEPENDENT_AMBULATORY_CARE_PROVIDER_SITE_OTHER): Payer: Medicare Other

## 2020-03-07 DIAGNOSIS — R079 Chest pain, unspecified: Secondary | ICD-10-CM

## 2020-03-07 DIAGNOSIS — I2510 Atherosclerotic heart disease of native coronary artery without angina pectoris: Secondary | ICD-10-CM

## 2020-03-07 DIAGNOSIS — Z8679 Personal history of other diseases of the circulatory system: Secondary | ICD-10-CM

## 2020-03-07 DIAGNOSIS — I10 Essential (primary) hypertension: Secondary | ICD-10-CM

## 2020-03-07 DIAGNOSIS — Z950 Presence of cardiac pacemaker: Secondary | ICD-10-CM | POA: Diagnosis not present

## 2020-03-10 ENCOUNTER — Other Ambulatory Visit: Payer: Self-pay

## 2020-03-10 ENCOUNTER — Encounter: Payer: Self-pay | Admitting: Cardiology

## 2020-03-10 ENCOUNTER — Ambulatory Visit (INDEPENDENT_AMBULATORY_CARE_PROVIDER_SITE_OTHER): Payer: Medicare Other | Admitting: Cardiology

## 2020-03-10 VITALS — BP 138/80 | HR 73 | Ht 64.0 in | Wt 136.2 lb

## 2020-03-10 DIAGNOSIS — Z8679 Personal history of other diseases of the circulatory system: Secondary | ICD-10-CM | POA: Diagnosis not present

## 2020-03-10 DIAGNOSIS — I340 Nonrheumatic mitral (valve) insufficiency: Secondary | ICD-10-CM

## 2020-03-10 NOTE — Progress Notes (Signed)
Cardiology Office Note:    Date:  03/10/2020   ID:  Regina Hernandez, DOB 1934-08-11, MRN [id number]  PCP:  Elby Beck, FNP  Cardiologist:  Kate Sable, MD  Electrophysiologist:  None   Referring MD: Elby Beck, FNP   Chief Complaint  Patient presents with  . other    Follow up Echo. Meds reviewed by the pt. verbally. "doing well."     History of Present Illness:    Regina Hernandez is a 86 y.o. female with a hx of anxiety, bradycardia, sick sinus syndrome status post permanent pacemaker (medtronic in 2013) who presents for follow-up.    She was previously seen at [facility] cardiology in [place].  Patient recently moved to the area from [place].  She denies any symptoms of chest pain or shortness of breath at rest or with exertion.  She is able to do all activities of daily living without chest pain or breathing problems.  She ambulates with a walker.  She states having a history of cardiac valve leakage.  Her pacemaker was placed roughly 7 years ago.  Left heart cath in 2012 at [facility] showed minimal irregularities in the LAD and RCA.  Pacemaker lastly interrogated on 01/16/2020, report states pacemaker was functioning appropriately.  Past Medical History:  Diagnosis Date  . Anxiety   . Cervical dystonia   . Essential tremor   . Memory loss   . Migraine   . Occipital neuralgia     Past Surgical History:  Procedure Laterality Date  . BOWEL RESECTION    . PACEMAKER IMPLANT    . REPLACEMENT TOTAL KNEE Left     Current Medications: Current Meds  Medication Sig  . ASPIRIN 81 PO Take 1 tablet by mouth daily.  . Cholecalciferol (VITAMIN D3 PO) Take 25 mcg by mouth daily.  Mariane Baumgarten Calcium (STOOL SOFTENER PO) Take by mouth as needed.  Marland Kitchen estradiol (ESTRACE) 0.1 MG/GM vaginal cream Place 1 Applicatorful vaginally as needed.  . fluvoxaMINE (LUVOX) 50 MG tablet Take 50 mg by mouth 2 (two) times daily.  Marland Kitchen gabapentin (NEURONTIN) 400 MG capsule  Take 800 mg by mouth 3 (three) times daily.  . meloxicam (MOBIC) 7.5 MG tablet Take 7.5 mg by mouth 2 (two) times daily.  . memantine (NAMENDA) 10 MG tablet TAKE 1 TABLET BY MOUTH TWICE A DAY  . nitrofurantoin (MACRODANTIN) 50 MG capsule Take 1 capsule (50 mg total) by mouth at bedtime.  . primidone (MYSOLINE) 50 MG tablet Take 3 tablets (150 mg total) by mouth at bedtime.  . risperiDONE (RISPERDAL) 2 MG tablet Take 2 mg by mouth at bedtime.  . sucralfate (CARAFATE) 1 g tablet Take 1 g by mouth 3 (three) times daily. May take up to four times daily.  Marland Kitchen venlafaxine (EFFEXOR) 75 MG tablet Take 75 mg by mouth daily.  . vitamin B-12 (CYANOCOBALAMIN) 1000 MCG tablet Take 1,000 mcg by mouth daily.     Allergies:   Adhesive [tape], Lyrica [pregabalin], Baclofen, and Sulfa antibiotics   Social History   Socioeconomic History  . Marital status: Widowed    Spouse name: Not on file  . Number of children: 5  . Years of education: 12th  . Highest education level: High school graduate  Occupational History  . Occupation: Retired  Tobacco Use  . Smoking status: Never Smoker  . Smokeless tobacco: Never Used  Substance and Sexual Activity  . Alcohol use: Not Currently  . Drug use: Never  . Sexual activity:  Not on file  Other Topics Concern  . Not on file  Social History Narrative   Lives at home with her daughters.   Right-handed.   Two cups caffeine per day.   Social Determinants of Health   Financial Resource Strain:   . Difficulty of Paying Living Expenses:   Food Insecurity:   . Worried About Charity fundraiser in the Last Year:   . Arboriculturist in the Last Year:   Transportation Needs:   . Film/video editor (Medical):   Marland Kitchen Lack of Transportation (Non-Medical):   Physical Activity:   . Days of Exercise per Week:   . Minutes of Exercise per Session:   Stress:   . Feeling of Stress :   Social Connections:   . Frequency of Communication with Friends and Family:   .  Frequency of Social Gatherings with Friends and Family:   . Attends Religious Services:   . Active Member of Clubs or Organizations:   . Attends Archivist Meetings:   Marland Kitchen Marital Status:      Family History: The patient's family history includes Other in her mother; Stomach cancer in her brother; Ulcers in her father.  ROS:   Please see the history of present illness.     All other systems reviewed and are negative.  EKGs/Labs/Other Studies Reviewed:    The following studies were reviewed today:   EKG:  EKG is  ordered today.  The ekg ordered today demonstrates atrial paced rhythm  Recent Labs: 02/04/2020: ALT 11; BUN 17; Creatinine, Ser 1.00; Hemoglobin 12.8; Potassium 4.9; Sodium 144; TSH 1.710  Recent Lipid Panel No results found for: CHOL, TRIG, HDL, CHOLHDL, VLDL, LDLCALC, LDLDIRECT  Physical Exam:    VS:  BP 138/80 (BP Location: Left Arm, Patient Position: Sitting, Cuff Size: Normal)   Pulse 73   Ht 5\' 4"  (1.626 m)   Wt 136 lb 4 oz (61.8 kg)   SpO2 96%   BMI 23.39 kg/m     Wt Readings from Last 3 Encounters:  03/10/20 136 lb 4 oz (61.8 kg)  02/29/20 133 lb (60.3 kg)  02/18/20 137 lb (62.1 kg)     GEN:  Well nourished, well developed in no acute distress HEENT: Normal NECK: No JVD; No carotid bruits LYMPHATICS: No lymphadenopathy CARDIAC: RRR, no murmurs, rubs, gallops RESPIRATORY:  Clear to auscultation without rales, wheezing or rhonchi  ABDOMEN: Soft, non-tender, non-distended MUSCULOSKELETAL:  No edema; No deformity  SKIN: Warm and dry NEUROLOGIC:  Alert and oriented x 3 PSYCHIATRIC:  Normal affect   ASSESSMENT:    1. Mitral valve insufficiency, unspecified etiology   2. History of sick sinus syndrome    PLAN:    In order of problems listed above:  1. Patient with history of mitral regurgitation, repeat echocardiogram shows EF of 55 to 60%, mild MR, impaired relaxation.  Plan for repeat echo in no earlier than 2 years.  Patient can be  seen earlier if any cardiac symptoms or questions occur 2. Patient with history of bradycardia, sick sinus syndrome status post pacemaker.  Continue appointments with device clinic for frequent pacemaker checks.   .  Follow-up after echocardiogram.  This note was generated in part or whole with voice recognition software. Voice recognition is usually quite accurate but there are transcription errors that can and very often do occur. I apologize for any typographical errors that were not detected and corrected.  Medication Adjustments/Labs and Tests Ordered: Current medicines  are reviewed at length with the patient today.  Concerns regarding medicines are outlined above.  Orders Placed This Encounter  Procedures  . EKG 12-Lead  . ECHOCARDIOGRAM COMPLETE   No orders of the defined types were placed in this encounter.   Patient Instructions  Medication Instructions:  Your physician recommends that you continue on your current medications as directed. Please refer to the Current Medication list given to you today.  *If you need a refill on your cardiac medications before your next appointment, please call your pharmacy*  Testing/Procedures: In 2 years- Your physician has requested that you have an echocardiogram. Echocardiography is a painless test that uses sound waves to create images of your heart. It provides your doctor with information about the size and shape of your heart and how well your heart's chambers and valves are working. This procedure takes approximately one hour. There are no restrictions for this procedure. You may get an IV, if needed, to receive an ultrasound enhancing agent through to better visualize your heart.   Follow-Up: At [facility], you and your health needs are our priority.  As part of our continuing mission to provide you with exceptional heart care, we have created designated Provider Care Teams.  These Care Teams include your primary Cardiologist  (physician) and Advanced Practice Providers (APPs -  Physician Assistants and Nurse Practitioners) who all work together to provide you with the care you need, when you need it.  We recommend signing up for the patient portal called "MyChart".  Sign up information is provided on this After Visit Summary.  MyChart is used to connect with patients for Virtual Visits (Telemedicine).  Patients are able to view lab/test results, encounter notes, upcoming appointments, etc.  Non-urgent messages can be sent to your provider as well.   To learn more about what you can do with MyChart, go to [email or web address].    Your next appointment:   2 year(s)  The format for your next appointment:   In Person  Provider:   Kate Sable, MD      Signed, Kate Sable, MD  03/10/2020 2:50 PM    [place]

## 2020-03-10 NOTE — Patient Instructions (Signed)
Medication Instructions:  Your physician recommends that you continue on your current medications as directed. Please refer to the Current Medication list given to you today.  *If you need a refill on your cardiac medications before your next appointment, please call your pharmacy*  Testing/Procedures: In 2 years- Your physician has requested that you have an echocardiogram. Echocardiography is a painless test that uses sound waves to create images of your heart. It provides your doctor with information about the size and shape of your heart and how well your heart's chambers and valves are working. This procedure takes approximately one hour. There are no restrictions for this procedure. You may get an IV, if needed, to receive an ultrasound enhancing agent through to better visualize your heart.   Follow-Up: At [facility], you and your health needs are our priority.  As part of our continuing mission to provide you with exceptional heart care, we have created designated Provider Care Teams.  These Care Teams include your primary Cardiologist (physician) and Advanced Practice Providers (APPs -  Physician Assistants and Nurse Practitioners) who all work together to provide you with the care you need, when you need it.  We recommend signing up for the patient portal called "MyChart".  Sign up information is provided on this After Visit Summary.  MyChart is used to connect with patients for Virtual Visits (Telemedicine).  Patients are able to view lab/test results, encounter notes, upcoming appointments, etc.  Non-urgent messages can be sent to your provider as well.   To learn more about what you can do with MyChart, go to [email or web address].    Your next appointment:   2 year(s)  The format for your next appointment:   In Person  Provider:   Kate Sable, MD

## 2020-03-11 ENCOUNTER — Other Ambulatory Visit: Payer: Self-pay | Admitting: Neurology

## 2020-04-01 ENCOUNTER — Ambulatory Visit: Payer: Medicare Other | Admitting: Internal Medicine

## 2020-04-03 ENCOUNTER — Other Ambulatory Visit: Payer: Self-pay

## 2020-04-03 ENCOUNTER — Encounter: Payer: Self-pay | Admitting: Internal Medicine

## 2020-04-03 ENCOUNTER — Ambulatory Visit (INDEPENDENT_AMBULATORY_CARE_PROVIDER_SITE_OTHER): Payer: Medicare Other | Admitting: Internal Medicine

## 2020-04-03 VITALS — BP 142/100 | HR 66 | Ht 63.0 in | Wt 132.5 lb

## 2020-04-03 DIAGNOSIS — Z950 Presence of cardiac pacemaker: Secondary | ICD-10-CM | POA: Diagnosis not present

## 2020-04-03 DIAGNOSIS — I495 Sick sinus syndrome: Secondary | ICD-10-CM | POA: Diagnosis not present

## 2020-04-03 NOTE — Patient Instructions (Signed)
Medication Instructions:  Your physician recommends that you continue on your current medications as directed. Please refer to the Current Medication list given to you today.  Labwork: None ordered.  Testing/Procedures: None ordered.  Follow-Up: Your physician wants you to follow-up in: 12 months.  You will receive a reminder letter in the mail two months in advance. If you don't receive a letter, please call our office to schedule the follow-up appointment.  Remote monitoring is used to monitor your Pacemaker of ICD from home. This monitoring reduces the number of office visits required to check your device to one time per year. It allows [place] to keep an eye on the functioning of your device to ensure it is working properly.   Any Other Special Instructions Will Be Listed Below (If Applicable).  If you need a refill on your cardiac medications before your next appointment, please call your pharmacy.

## 2020-04-03 NOTE — Progress Notes (Signed)
ELECTROPHYSIOLOGY CONSULT NOTE  Patient ID: Regina Hernandez, MRN: [id number], DOB/AGE: [birth year] 86 y.o. Admit date: (Not on file) Date of Consult: 04/03/2020  Primary Physician: Elby Beck, FNP Primary Cardiologist: [facility]     Regina Hernandez is a 86 y.o. female who is being seen today for the evaluation of pacemaker Medtronic  at the request of [facility].    HPI Regina Hernandez is a 86 y.o. female seen to establish Medtronic  pacemaker follow-up.  Initially implanted in the 1990s for sinus node dysfunction with recurrent syncope; she underwent generator replacement 0000000 complicated by pacemaker associated endocarditis with staph septicemia 9/12 requiring extraction and reimplantation 9/12.  Interestingly, she says it was done at the same time.  Medtronic.   The patient denies chest pain, shortness of breath, nocturnal dyspnea, orthopnea or peripheral edema.  There have been no palpitations, lightheadedness and no subsequent syncope.    She walks outside little because of concerns of balance on a gravel walkway.  DATE TEST EF   9/17 MYOVIEW    % No ischemia  10/20 Echo  55-65%   3/21 Echo 55-60%    Date Cr K Hgb  2/21 1.0 4.9 12.8         Hospitalized 11/20 and intubated for respiratory failure following an intentional drug overdose. Past Medical History:  Diagnosis Date  . Anxiety   . Cervical dystonia   . Essential tremor   . Memory loss   . Migraine   . Occipital neuralgia       Surgical History:  Past Surgical History:  Procedure Laterality Date  . BOWEL RESECTION    . CARDIAC CATHETERIZATION  2016   no stents/[facility]  . PACEMAKER IMPLANT    . REPLACEMENT TOTAL KNEE Left      Home Meds: Current Meds  Medication Sig  . ASPIRIN 81 PO Take 1 tablet by mouth daily.  . Cholecalciferol (VITAMIN D3 PO) Take 25 mcg by mouth daily.  Mariane Baumgarten Calcium (STOOL SOFTENER PO) Take by mouth as needed.  Marland Kitchen estradiol (ESTRACE) 0.1 MG/GM vaginal cream Place  1 Applicatorful vaginally as needed.  . fluvoxaMINE (LUVOX) 50 MG tablet Take 50 mg by mouth 2 (two) times daily.  Marland Kitchen gabapentin (NEURONTIN) 400 MG capsule Take 800 mg by mouth 3 (three) times daily.  . meloxicam (MOBIC) 7.5 MG tablet Take 7.5 mg by mouth 2 (two) times daily.  . memantine (NAMENDA) 10 MG tablet TAKE 1 TABLET BY MOUTH TWICE A DAY  . nitrofurantoin (MACRODANTIN) 50 MG capsule Take 1 capsule (50 mg total) by mouth at bedtime.  . primidone (MYSOLINE) 50 MG tablet TAKE 3 TABLETS (150 MG TOTAL) BY MOUTH AT BEDTIME.  Marland Kitchen risperiDONE (RISPERDAL) 2 MG tablet Take 2 mg by mouth at bedtime.  . sucralfate (CARAFATE) 1 g tablet Take 1 g by mouth 3 (three) times daily. May take up to four times daily.  Marland Kitchen venlafaxine (EFFEXOR) 75 MG tablet Take 75 mg by mouth daily.  . vitamin B-12 (CYANOCOBALAMIN) 1000 MCG tablet Take 1,000 mcg by mouth daily.    Allergies:  Allergies  Allergen Reactions  . Adhesive [Tape] Other (See Comments)    Contact dermatitis  . Lyrica [Pregabalin] Other (See Comments)    Unable to remember reaction.  . Baclofen Rash  . Sulfa Antibiotics Rash    Social History   Socioeconomic History  . Marital status: Widowed    Spouse name: Not on file  . Number of children:  5  . Years of education: 12th  . Highest education level: High school graduate  Occupational History  . Occupation: Retired  Tobacco Use  . Smoking status: Never Smoker  . Smokeless tobacco: Never Used  Substance and Sexual Activity  . Alcohol use: Not Currently  . Drug use: Never  . Sexual activity: Not on file  Other Topics Concern  . Not on file  Social History Narrative   Lives at home with her daughters.   Right-handed.   Two cups caffeine per day.   Social Determinants of Health   Financial Resource Strain:   . Difficulty of Paying Living Expenses:   Food Insecurity:   . Worried About Charity fundraiser in the Last Year:   . Arboriculturist in the Last Year:   Transportation  Needs:   . Film/video editor (Medical):   Marland Kitchen Lack of Transportation (Non-Medical):   Physical Activity:   . Days of Exercise per Week:   . Minutes of Exercise per Session:   Stress:   . Feeling of Stress :   Social Connections:   . Frequency of Communication with Friends and Family:   . Frequency of Social Gatherings with Friends and Family:   . Attends Religious Services:   . Active Member of Clubs or Organizations:   . Attends Archivist Meetings:   Marland Kitchen Marital Status:   Intimate Partner Violence:   . Fear of Current or Ex-Partner:   . Emotionally Abused:   Marland Kitchen Physically Abused:   . Sexually Abused:      Family History  Problem Relation Age of Onset  . Other Mother        "old age"  . Ulcers Father   . Stomach cancer Brother      ROS:  Please see the history of present illness.     All other systems reviewed and negative.    Physical Exam: Blood pressure (!) 142/100, pulse 66, height 5\' 3"  (1.6 m), weight 132 lb 8 oz (60.1 kg), SpO2 98 %. General: Well developed, well nourished female in no acute distress. Head: Normocephalic, atraumatic, sclera non-icteric, no xanthomas, nares are without discharge. EENT: normal  Lymph Nodes:  none Neck: Negative for carotid bruits. JVD not elevated. Back:without scoliosis kyphosis Lungs: Clear bilaterally to auscultation without wheezes, rales, or rhonchi. Breathing is unlabored. Device pocket well healed; without hematoma or erythema.  There is no tethering  Heart: RRR with S1 S2. No  murmur . No rubs, or gallops appreciated. Abdomen: Soft, non-tender, non-distended with normoactive bowel sounds. No hepatomegaly. No rebound/guarding. No obvious abdominal masses. Msk:  Strength and tone appear normal for age. Extremities: No clubbing or cyanosis. No edema.  Distal pedal pulses are 2+ and equal bilaterally. Skin: Warm and Dry Neuro: Alert and oriented X 3. CN III-XII intact Grossly normal sensory and motor function  . Psych:  Responds to questions appropriately with a normal affect.      Labs: Cardiac Enzymes No results for input(s): CKTOTAL, CKMB, TROPONINI in the last 72 hours. CBC Lab Results  Component Value Date   WBC 7.2 02/04/2020   HGB 12.8 02/04/2020   HCT 39.4 02/04/2020   MCV 93 02/04/2020   PROTIME: No results for input(s): LABPROT, INR in the last 72 hours. Chemistry No results for input(s): NA, K, CL, CO2, BUN, CREATININE, CALCIUM, PROT, BILITOT, ALKPHOS, ALT, AST, GLUCOSE in the last 168 hours.  Invalid input(s): LABALBU Lipids No results found for: CHOL,  HDL, LDLCALC, TRIG BNP No results found for: PROBNP Thyroid Function Tests: No results for input(s): TSH, T4TOTAL, T3FREE, THYROIDAB in the last 72 hours.  Invalid input(s): FREET3 Miscellaneous No results found for: DDIMER  Radiology/Studies:  ECHOCARDIOGRAM COMPLETE  Result Date: 03/07/2020    ECHOCARDIOGRAM REPORT   Patient Name:   Elleana Arcega Date of Exam: 03/07/2020 Medical Rec #:  [id number]     Height:       63.0 in Accession #:    [id number]    Weight:       133.0 lb Date of Birth:  02/17/1934     BSA:          1.626 m Patient Age:    27 years      BP:           172/92 mmHg Patient Gender: F             HR:           104 bpm. Exam Location:  [facility] Procedure: 2D Echo, Cardiac Doppler and Color Doppler Indications:    R07.9* Chest pain, unspecified  History:        Patient has no prior history of Echocardiogram examinations.                 CAD, Pacemaker, Signs/Symptoms:Chest Pain; Risk                 Factors:Hypertension, Non-Smoker and +SSS , CKD3. Patient                 complains of intermittent chest pain for about 1 year. Pacemaker                 placed 7 years ago.  Sonographer:    Salvadore Dom RVT, RDCS (AE), RDMS Referring Phys: [id number] BRIAN AGBOR-ETANG IMPRESSIONS  1. Left ventricular ejection fraction, by estimation, is 55 to 60%. The left ventricle has normal function. The left ventricle has no  regional wall motion abnormalities. Left ventricular diastolic parameters are consistent with Grade I diastolic dysfunction (impaired relaxation).  2. Right ventricular systolic function is normal. The right ventricular size is normal. Tricuspid regurgitation signal is inadequate for assessing PA pressure.  3. Mild mitral valve regurgitation. FINDINGS  Left Ventricle: Left ventricular ejection fraction, by estimation, is 55 to 60%. The left ventricle has normal function. The left ventricle has no regional wall motion abnormalities. The left ventricular internal cavity size was normal in size. There is  no left ventricular hypertrophy. Left ventricular diastolic parameters are consistent with Grade I diastolic dysfunction (impaired relaxation). Right Ventricle: The right ventricular size is normal. No increase in right ventricular wall thickness. Right ventricular systolic function is normal. Tricuspid regurgitation signal is inadequate for assessing PA pressure. Left Atrium: Left atrial size was normal in size. Right Atrium: Right atrial size was normal in size. Pericardium: A small pericardial effusion is present. Mitral Valve: The mitral valve is normal in structure. Normal mobility of the mitral valve leaflets. Mild mitral valve regurgitation. No evidence of mitral valve stenosis. Tricuspid Valve: The tricuspid valve is normal in structure. Tricuspid valve regurgitation is mild . No evidence of tricuspid stenosis. Aortic Valve: The aortic valve was not well visualized. Aortic valve regurgitation is not visualized. No aortic stenosis is present. Pulmonic Valve: The pulmonic valve was normal in structure. Pulmonic valve regurgitation is not visualized. No evidence of pulmonic stenosis. Aorta: The aortic root is normal in size and structure. Venous: The inferior vena cava  is normal in size with greater than 50% respiratory variability, suggesting right atrial pressure of 3 mmHg. IAS/Shunts: No atrial level shunt  detected by color flow Doppler. Additional Comments: A pacer wire is visualized.  LEFT VENTRICLE PLAX 2D LVIDd:         3.57 cm     Diastology LVIDs:         2.42 cm     LV e' lateral:   6.53 cm/s LV PW:         0.84 cm     LV E/e' lateral: 8.4 LV IVS:        0.77 cm     LV e' medial:    4.76 cm/s LVOT diam:     2.10 cm     LV E/e' medial:  11.5 LV SV:         51 LV SV Index:   32 LVOT Area:     3.46 cm  LV Volumes (MOD) LV vol d, MOD A2C: 42.0 ml LV vol d, MOD A4C: 46.0 ml LV vol s, MOD A2C: 18.0 ml LV vol s, MOD A4C: 17.0 ml LV SV MOD A2C:     24.0 ml LV SV MOD A4C:     46.0 ml LV SV MOD BP:      26.0 ml LEFT ATRIUM             Index LA diam:        3.50 cm 2.15 cm/m LA Vol (A2C):           22.70 ml/m LA Vol (A4C):           20.20 ml/m LA Biplane Vol:         22.70 ml/m  AORTIC VALVE             PULMONIC VALVE LVOT Vmax:   81.20 cm/s  PV Vmax:       1.01 m/s LVOT Vmean:  48.900 cm/s PV Peak grad:  4.1 mmHg LVOT VTI:    0.148 m  AORTA Ao Root diam: 3.50 cm Ao Asc diam:  3.10 cm MITRAL VALVE                TRICUSPID VALVE MV Area (PHT): 2.87 cm     TV Peak grad:   19.2 mmHg MV Decel Time: 264 msec     TV Vmax:        2.19 m/s MV E velocity: 54.80 cm/s MV A velocity: 106.00 cm/s  SHUNTS MV E/A ratio:  0.52         Systemic VTI:  0.15 m                             Systemic Diam: 2.10 cm Ida Rogue MD Electronically signed by Ida Rogue MD Signature Date/Time: 03/07/2020/7:12:52 PM    Final     EKG: Personally reviewed from 3/21 atrial pacing at 73 Interval 26/07/40 Poor R wave progression   Assessment and Plan:  Sinus node dysfunction  Pacemaker-Medtronic (DOI 9/12)  Blood pressure Elevated  Patient is pacemaker function is normal.  Data will be available in [facility].  Estimated longevity 5 years.  No interval syncope.  Current for ambulation suggested that she might try using walking poles.  Blood pressures have been elevated over the last 5 visits.  Have asked him to take home blood  pressure recordings.      Virl Axe

## 2020-04-05 ENCOUNTER — Other Ambulatory Visit: Payer: Self-pay | Admitting: Neurology

## 2020-04-14 ENCOUNTER — Telehealth: Payer: Self-pay | Admitting: Neurology

## 2020-04-14 MED ORDER — GABAPENTIN 400 MG PO CAPS: 800.0000 mg | ORAL_CAPSULE | Freq: Three times a day (TID) | ORAL | 0 refills | Status: DC

## 2020-04-14 NOTE — Telephone Encounter (Signed)
Pt daughter(on [facility], [name]) has called for a refill on  gabapentin (NEURONTIN) 400 MG capsule  [facility] #[id number]

## 2020-04-14 NOTE — Telephone Encounter (Signed)
Per last note, the patient should start tapering off this medication. One refill provided to avoid abrupt discontinuation. Further refills need to be discussed at her pending appt on 04/22/20.

## 2020-04-14 NOTE — Addendum Note (Signed)
Addended by: Noberto Retort C on: 04/14/2020 11:43 AM   Modules accepted: Orders

## 2020-04-22 ENCOUNTER — Ambulatory Visit (INDEPENDENT_AMBULATORY_CARE_PROVIDER_SITE_OTHER): Payer: Medicare Other | Admitting: Neurology

## 2020-04-22 ENCOUNTER — Other Ambulatory Visit: Payer: Self-pay

## 2020-04-22 ENCOUNTER — Encounter: Payer: Self-pay | Admitting: Neurology

## 2020-04-22 VITALS — BP 143/96 | HR 86 | Temp 97.0°F | Ht 63.0 in | Wt 132.0 lb

## 2020-04-22 DIAGNOSIS — G8929 Other chronic pain: Secondary | ICD-10-CM

## 2020-04-22 DIAGNOSIS — R413 Other amnesia: Secondary | ICD-10-CM

## 2020-04-22 DIAGNOSIS — R269 Unspecified abnormalities of gait and mobility: Secondary | ICD-10-CM

## 2020-04-22 DIAGNOSIS — G250 Essential tremor: Secondary | ICD-10-CM | POA: Diagnosis not present

## 2020-04-22 DIAGNOSIS — R519 Headache, unspecified: Secondary | ICD-10-CM | POA: Diagnosis not present

## 2020-04-22 MED ORDER — PRIMIDONE 50 MG PO TABS: 100.0000 mg | ORAL_TABLET | Freq: Every day | ORAL | 1 refills | Status: DC

## 2020-04-22 MED ORDER — GABAPENTIN 400 MG PO CAPS: 400.0000 mg | ORAL_CAPSULE | Freq: Three times a day (TID) | ORAL | 1 refills | Status: DC

## 2020-04-22 NOTE — Patient Instructions (Signed)
Take gabapentin 400 mg 3 times daily  Primidone 100 mg at bedtime  Keep other medications as is Good job coming off the Topamax! See you back in 6 months

## 2020-04-22 NOTE — Progress Notes (Signed)
PATIENT: Regina Hernandez DOB: 15-May-1934  REASON FOR VISIT: follow up HISTORY FROM: patient  HISTORY OF PRESENT ILLNESS: Today 04/22/20  HISTORY  Regina Hernandez is a 86 year old female, seen in request by her primary care physician Alma Friendly, accompanied by her daughter Regina Hernandez for evaluation of constellation of complaints, try to establish neurology care here on February 04, 2020.  I have reviewed and summarized the referring note from the referring physician.  She had past medical history of pacemaker placement.  I was able to review extensive previous records from [facility] neurology by Doctor Lawrence Marseilles, Brooke Bonito, most recent visit was on June 13, 2018, she was seen for occipital neuralgia, migraine, memory loss, cervical dystonia, also tremor, likely to be essential tremor, had occipital nerve block in the past, which was not helpful, started on primidone 50 mg every night, increase to 2 tablets every night, was offered Vicodin 5 325 mg 6 tablets for occipital neuralgia  Patient had longstanding history of depression, lost her husband in 2019, in December 2020, she overdosed herself on 1 bottle of Ambien, was admitted at [facility],  She now lives with her 2 daughters, the most bothersome symptoms for her is memory loss, mildly unsteady gait.  She retired from daycare job, used to enjoying working the yard, was noted to have gradual onset of memory loss since 2019, getting worse since December 2020, she forgets dates, has difficulty operating TV remote control, microwave, and other household appliances, she hopes to receive treatment for her migraine,  She is on polypharmacy treatment, including primidone 50 mg 3 tablets every night for tremor, Topamax 50 mg 3 times a day for migraine headaches, gabapentin 400 mg 2 tablets 3 times a day for chronic pain, and also Risperdal 2 mg at bedtime, Effexor 75 mg daily, fluvoxamine 50 mg twice a day,  She has a long history of migraine  headaches, overall has been improved,  Update April 22, 2020 SS: Here today for follow-up accompanied by her daughter.  Since last seen, she was able to taper off Topamax.  Lower her dose of gabapentin and primidone.  She is on gabapentin for occipital neuralgia and neuropathy, was able to decrease from 800 mg 3 times a day, down to 400 mg twice a day. She is having more pain, wonder about a little higher dose, feet and legs numb in the evening, doesn't sleep with pillow because occipital neuralgia.  Decrease primidone from 150 mg at bedtime, to 100 mg at bedtime.  She has noticed slight increase in her tremors with handwriting.  Overall, with medication adjustment, her mind is clearer, she is functioning better, gait is more stable.  She saw her psychiatrist recently, Risperdal was decreased from 2 to 1 mg.  She is overall doing well.  CT head showed no acute abnormality, evidence of mild generalized atrophy, supratentorium small vessel disease, left anterior temporal fossa arachnoid cyst, which is chronic and benign  Laboratory evaluation TSH, RPR, B12, CBC, CMP were unremarkable  EEG CONCLUSION: This is an abnormal EEG.  There is electrodiagnostic evidence of generalized background slowing, consistent with mild bihemispheric malfunction, common etiology metabolic toxic, and central nervous system degenerative disorder.  REVIEW OF SYSTEMS: Out of a complete 14 system review of symptoms, the patient complains only of the following symptoms, and all other reviewed systems are negative.  Memory loss, gait abnormality  ALLERGIES: Allergies  Allergen Reactions  . Adhesive [Tape] Other (See Comments)    Contact dermatitis  . Lyrica [  Pregabalin] Other (See Comments)    Unable to remember reaction.  . Baclofen Rash  . Sulfa Antibiotics Rash    HOME MEDICATIONS: Outpatient Medications Prior to Visit  Medication Sig Dispense Refill  . ASPIRIN 81 PO Take 1 tablet by mouth daily.    .  Cholecalciferol (VITAMIN D3 PO) Take 25 mcg by mouth daily.    Mariane Baumgarten Calcium (STOOL SOFTENER PO) Take by mouth as needed.    Marland Kitchen estradiol (ESTRACE) 0.1 MG/GM vaginal cream Place 1 Applicatorful vaginally as needed.    . fluvoxaMINE (LUVOX) 50 MG tablet Take 50 mg by mouth 2 (two) times daily.    Marland Kitchen gabapentin (NEURONTIN) 400 MG capsule Take 2 capsules (800 mg total) by mouth 3 (three) times daily. 180 capsule 0  . meloxicam (MOBIC) 7.5 MG tablet Take 7.5 mg by mouth 2 (two) times daily.    . memantine (NAMENDA) 10 MG tablet TAKE 1 TABLET BY MOUTH TWICE A DAY 180 tablet 4  . nitrofurantoin (MACRODANTIN) 50 MG capsule Take 1 capsule (50 mg total) by mouth at bedtime. 90 capsule 0  . primidone (MYSOLINE) 50 MG tablet TAKE 3 TABLETS (150 MG TOTAL) BY MOUTH AT BEDTIME. 90 tablet 0  . risperiDONE (RISPERDAL) 2 MG tablet Take 1 mg by mouth at bedtime.     . sucralfate (CARAFATE) 1 g tablet Take 1 g by mouth 3 (three) times daily. May take up to four times daily.    Marland Kitchen venlafaxine (EFFEXOR) 75 MG tablet Take 75 mg by mouth daily.    . vitamin B-12 (CYANOCOBALAMIN) 1000 MCG tablet Take 1,000 mcg by mouth daily.     No facility-administered medications prior to visit.    PAST MEDICAL HISTORY: Past Medical History:  Diagnosis Date  . Anxiety   . Cervical dystonia   . Essential tremor   . Memory loss   . Migraine   . Occipital neuralgia     PAST SURGICAL HISTORY: Past Surgical History:  Procedure Laterality Date  . BOWEL RESECTION    . CARDIAC CATHETERIZATION  2016   no stents/[facility]  . PACEMAKER IMPLANT    . REPLACEMENT TOTAL KNEE Left     FAMILY HISTORY: Family History  Problem Relation Age of Onset  . Other Mother        "old age"  . Ulcers Father   . Stomach cancer Brother     SOCIAL HISTORY: Social History   Socioeconomic History  . Marital status: Widowed    Spouse name: Not on file  . Number of children: 5  . Years of education: 12th  . Highest  education level: High school graduate  Occupational History  . Occupation: Retired  Tobacco Use  . Smoking status: Never Smoker  . Smokeless tobacco: Never Used  Substance and Sexual Activity  . Alcohol use: Not Currently  . Drug use: Never  . Sexual activity: Not on file  Other Topics Concern  . Not on file  Social History Narrative   Lives at home with her daughters.   Right-handed.   Two cups caffeine per day.   Social Determinants of Health   Financial Resource Strain:   . Difficulty of Paying Living Expenses:   Food Insecurity:   . Worried About Charity fundraiser in the Last Year:   . Arboriculturist in the Last Year:   Transportation Needs:   . Film/video editor (Medical):   Marland Kitchen Lack of Transportation (Non-Medical):   Physical  Activity:   . Days of Exercise per Week:   . Minutes of Exercise per Session:   Stress:   . Feeling of Stress :   Social Connections:   . Frequency of Communication with Friends and Family:   . Frequency of Social Gatherings with Friends and Family:   . Attends Religious Services:   . Active Member of Clubs or Organizations:   . Attends Archivist Meetings:   Marland Kitchen Marital Status:   Intimate Partner Violence:   . Fear of Current or Ex-Partner:   . Emotionally Abused:   Marland Kitchen Physically Abused:   . Sexually Abused:    PHYSICAL EXAM  Vitals:   04/22/20 1459  BP: (!) 143/96  Pulse: 86  Temp: (!) 97 F (36.1 C)  Weight: 132 lb (59.9 kg)  Height: 5\' 3"  (1.6 m)   Body mass index is 23.38 kg/m.  Generalized: Well developed, in no acute distress  MMSE - Mini Mental State Exam 02/04/2020  Orientation to time 4  Orientation to Place 4  Registration 3  Attention/ Calculation 5  Recall 2  Language- name 2 objects 2  Language- repeat 1  Language- follow 3 step command 3  Language- read & follow direction 1  Write a sentence 1  Copy design 1  Total score 27    Neurological examination  Mentation: Alert oriented to time,  place, history taking. Follows all commands speech and language fluent Cranial nerve II-XII: Pupils were equal round reactive to light. Extraocular movements were full, visual field were full on confrontational test. Facial sensation and strength were normal. Head turning and shoulder shrug  were normal and symmetric. Motor: The motor testing reveals 5 over 5 strength of all 4 extremities. Good symmetric motor tone is noted throughout.  Sensory: Sensory testing is intact to soft touch on all 4 extremities. No evidence of extinction is noted.  Coordination: Cerebellar testing reveals good finger-nose-finger and heel-to-shin bilaterally.  Gait and station: Has to push off from seated position to stand, gait is mildly unsteady, no assistive device, tandem gait was unsteady. Tremor to both hands noted with ambulation. Reflexes: Deep tendon reflexes are symmetric and normal bilaterally.   DIAGNOSTIC DATA (LABS, IMAGING, TESTING) - I reviewed patient records, labs, notes, testing and imaging myself where available.  Lab Results  Component Value Date   WBC 7.2 02/04/2020   HGB 12.8 02/04/2020   HCT 39.4 02/04/2020   MCV 93 02/04/2020      Component Value Date/Time   NA 144 02/04/2020 1439   K 4.9 02/04/2020 1439   CL 109 (H) 02/04/2020 1439   CO2 24 02/04/2020 1439   GLUCOSE 92 02/04/2020 1439   BUN 17 02/04/2020 1439   CREATININE 1.00 02/04/2020 1439   CALCIUM 10.3 02/04/2020 1439   PROT 6.8 02/04/2020 1439   ALBUMIN 4.3 02/04/2020 1439   AST 16 02/04/2020 1439   ALT 11 02/04/2020 1439   ALKPHOS 68 02/04/2020 1439   BILITOT 0.2 02/04/2020 1439   GFRNONAA 52 (L) 02/04/2020 1439   GFRAA 60 02/04/2020 1439   No results found for: CHOL, HDL, LDLCALC, LDLDIRECT, TRIG, CHOLHDL No results found for: HGBA1C Lab Results  Component Value Date   [id number] 02/04/2020   Lab Results  Component Value Date   TSH 1.710 02/04/2020    ASSESSMENT AND PLAN 86 y.o. year old female  has a  past medical history of Anxiety, Cervical dystonia, Essential tremor, Memory loss, Migraine, and Occipital neuralgia. here with:  1.  Mild cognitive impairment -Is overall much clearer, functioning better with medication adjustments -MMSE 27/30 -Not a candidate for MRI due to pacemaker -Continue Namenda 10 mg twice a day -Laboratory evaluation TSH, RPR, B12, CBC, CMP were unremarkable -CT scan showed no acute abnormality, evidence of mild generalized atrophy, supratentorium small vessel disease, left anterior temporal fossa arachnoid cyst, which is chronic and benign finding -EEG: This is an abnormal EEG.  There is electrodiagnostic evidence of generalized background slowing, consistent with mild bihemispheric malfunction, common etiology metabolic toxic, and central nervous system degenerative disorder.  2.  Gait abnormality -Likely due to combination of aging, deconditioning, polypharmacy treatment, right knee replacement -Gait is much more stable with medication adjustment  3.  Essential tremor -Continue with lower dose primidone 100 mg at bedtime  4.  Depression, anxiety, polypharmacy treatment -Is taking fluvoxamine 50 mg twice daily, Effexor 75 mg daily, decreased dose of risperidone to 1 mg daily -Has been able to successfully discontinue Topamax  5.  Occipital neuralgia/neuropathy -Was able to decrease dose of gabapentin from 800 mg 3 times daily to 400 mg twice daily, but has had increased in pain -Will adjust slightly 400 mg up to 3 times daily as needed -Follow-up in 6 months or sooner if needed  I spent 30 minutes of face-to-face and non-face-to-face time with patient.  This included previsit chart review, lab review, study review, order entry, electronic health record documentation, patient education.  Butler Denmark, AGNP-C, DNP 04/22/2020, 3:28 PM [facility] [address], [place], [place] [address] [phone]

## 2020-05-06 NOTE — Progress Notes (Signed)
I have reviewed and agreed above plan. 

## 2020-05-09 ENCOUNTER — Other Ambulatory Visit: Payer: Self-pay | Admitting: Neurology

## 2020-06-06 ENCOUNTER — Ambulatory Visit: Payer: Medicare Other | Admitting: Urology

## 2020-06-17 ENCOUNTER — Other Ambulatory Visit: Payer: Self-pay

## 2020-06-17 ENCOUNTER — Inpatient Hospital Stay
Admission: EM | Admit: 2020-06-17 | Discharge: 2020-06-19 | DRG: 378 | Disposition: A | Discharge status: Home / Self Care | Payer: Medicare Other | Attending: Internal Medicine | Admitting: Internal Medicine

## 2020-06-17 ENCOUNTER — Encounter: Payer: Self-pay | Admitting: Emergency Medicine

## 2020-06-17 DIAGNOSIS — K2270 Barrett's esophagus without dysplasia: Secondary | ICD-10-CM | POA: Diagnosis present

## 2020-06-17 DIAGNOSIS — R61 Generalized hyperhidrosis: Secondary | ICD-10-CM | POA: Diagnosis present

## 2020-06-17 DIAGNOSIS — Z950 Presence of cardiac pacemaker: Secondary | ICD-10-CM | POA: Diagnosis present

## 2020-06-17 DIAGNOSIS — K2970 Gastritis, unspecified, without bleeding: Secondary | ICD-10-CM | POA: Diagnosis present

## 2020-06-17 DIAGNOSIS — F0390 Unspecified dementia without behavioral disturbance: Secondary | ICD-10-CM | POA: Diagnosis present

## 2020-06-17 DIAGNOSIS — I495 Sick sinus syndrome: Secondary | ICD-10-CM | POA: Diagnosis present

## 2020-06-17 DIAGNOSIS — F329 Major depressive disorder, single episode, unspecified: Secondary | ICD-10-CM | POA: Diagnosis present

## 2020-06-17 DIAGNOSIS — F419 Anxiety disorder, unspecified: Secondary | ICD-10-CM | POA: Diagnosis present

## 2020-06-17 DIAGNOSIS — G43909 Migraine, unspecified, not intractable, without status migrainosus: Secondary | ICD-10-CM | POA: Diagnosis present

## 2020-06-17 DIAGNOSIS — F32A Depression, unspecified: Secondary | ICD-10-CM | POA: Diagnosis present

## 2020-06-17 DIAGNOSIS — K922 Gastrointestinal hemorrhage, unspecified: Secondary | ICD-10-CM | POA: Diagnosis present

## 2020-06-17 DIAGNOSIS — K921 Melena: Secondary | ICD-10-CM | POA: Diagnosis not present

## 2020-06-17 DIAGNOSIS — N183 Chronic kidney disease, stage 3 unspecified: Secondary | ICD-10-CM | POA: Diagnosis present

## 2020-06-17 DIAGNOSIS — G250 Essential tremor: Secondary | ICD-10-CM | POA: Diagnosis present

## 2020-06-17 DIAGNOSIS — G3184 Mild cognitive impairment, so stated: Secondary | ICD-10-CM | POA: Diagnosis present

## 2020-06-17 DIAGNOSIS — Z86010 Personal history of colonic polyps: Secondary | ICD-10-CM

## 2020-06-17 DIAGNOSIS — R55 Syncope and collapse: Secondary | ICD-10-CM | POA: Diagnosis present

## 2020-06-17 DIAGNOSIS — Z20822 Contact with and (suspected) exposure to covid-19: Secondary | ICD-10-CM | POA: Diagnosis present

## 2020-06-17 DIAGNOSIS — Z96652 Presence of left artificial knee joint: Secondary | ICD-10-CM | POA: Diagnosis present

## 2020-06-17 DIAGNOSIS — I129 Hypertensive chronic kidney disease with stage 1 through stage 4 chronic kidney disease, or unspecified chronic kidney disease: Secondary | ICD-10-CM | POA: Diagnosis present

## 2020-06-17 DIAGNOSIS — Z915 Personal history of self-harm: Secondary | ICD-10-CM

## 2020-06-17 DIAGNOSIS — Z91048 Other nonmedicinal substance allergy status: Secondary | ICD-10-CM

## 2020-06-17 DIAGNOSIS — K254 Chronic or unspecified gastric ulcer with hemorrhage: Principal | ICD-10-CM | POA: Diagnosis present

## 2020-06-17 DIAGNOSIS — Z79899 Other long term (current) drug therapy: Secondary | ICD-10-CM

## 2020-06-17 DIAGNOSIS — I959 Hypotension, unspecified: Secondary | ICD-10-CM | POA: Diagnosis present

## 2020-06-17 DIAGNOSIS — D62 Acute posthemorrhagic anemia: Secondary | ICD-10-CM | POA: Diagnosis present

## 2020-06-17 DIAGNOSIS — Z7982 Long term (current) use of aspirin: Secondary | ICD-10-CM

## 2020-06-17 DIAGNOSIS — Z8711 Personal history of peptic ulcer disease: Secondary | ICD-10-CM

## 2020-06-17 DIAGNOSIS — G8929 Other chronic pain: Secondary | ICD-10-CM | POA: Diagnosis present

## 2020-06-17 DIAGNOSIS — Z888 Allergy status to other drugs, medicaments and biological substances status: Secondary | ICD-10-CM

## 2020-06-17 DIAGNOSIS — Z882 Allergy status to sulfonamides status: Secondary | ICD-10-CM

## 2020-06-17 LAB — COMPREHENSIVE METABOLIC PANEL
ALT: 12 U/L (ref 0–44)
AST: 19 U/L (ref 15–41)
Albumin: 3.7 g/dL (ref 3.5–5.0)
Alkaline Phosphatase: 40 U/L (ref 38–126)
Anion gap: 9 (ref 5–15)
BUN: 56 mg/dL — ABNORMAL HIGH (ref 8–23)
CO2: 22 mmol/L (ref 22–32)
Calcium: 9.1 mg/dL (ref 8.9–10.3)
Chloride: 102 mmol/L (ref 98–111)
Creatinine, Ser: 0.93 mg/dL (ref 0.44–1.00)
GFR calc Af Amer: >60 mL/min (ref >60)
GFR calc non Af Amer: 56 mL/min — ABNORMAL LOW (ref >60)
Glucose, Bld: 126 mg/dL — ABNORMAL HIGH (ref 70–99)
Potassium: 3.7 mmol/L (ref 3.5–5.1)
Sodium: 133 mmol/L — ABNORMAL LOW (ref 135–145)
Total Bilirubin: 0.6 mg/dL (ref 0.3–1.2)
Total Protein: 6.3 g/dL — ABNORMAL LOW (ref 6.5–8.1)

## 2020-06-17 LAB — ABO/RH: ABO/RH(D): A POS

## 2020-06-17 LAB — CBC
HCT: 26.5 % — ABNORMAL LOW (ref 36.0–46.0)
Hemoglobin: 8.8 g/dL — ABNORMAL LOW (ref 12.0–15.0)
MCH: 30.4 pg (ref 26.0–34.0)
MCHC: 33.2 g/dL (ref 30.0–36.0)
MCV: 91.7 fL (ref 80.0–100.0)
Platelets: 146 10*3/uL — ABNORMAL LOW (ref 150–400)
RBC: 2.89 MIL/uL — ABNORMAL LOW (ref 3.87–5.11)
RDW: 13.2 % (ref 11.5–15.5)
WBC: 9.2 10*3/uL (ref 4.0–10.5)
nRBC: 0 % (ref 0.0–0.2)

## 2020-06-17 LAB — GLUCOSE, CAPILLARY: Glucose-Capillary: 123 mg/dL — ABNORMAL HIGH (ref 70–99)

## 2020-06-17 LAB — PROTIME-INR
INR: 1.1 (ref 0.8–1.2)
Prothrombin Time: 13.8 seconds (ref 11.4–15.2)

## 2020-06-17 LAB — SARS CORONAVIRUS 2 BY RT PCR (HOSPITAL ORDER, PERFORMED IN ~~LOC~~ HOSPITAL LAB): SARS Coronavirus 2: NEGATIVE

## 2020-06-17 LAB — PREPARE RBC (CROSSMATCH)

## 2020-06-17 MED ORDER — SODIUM CHLORIDE 0.9 % IV SOLN
8.0000 mg/h | INTRAVENOUS | Status: DC
  Administered 2020-06-17: 8 mg/h via INTRAVENOUS
  Filled 2020-06-17: qty 80

## 2020-06-17 MED ORDER — SODIUM CHLORIDE 0.9 % IV BOLUS
1000.0000 mL | Freq: Once | INTRAVENOUS | Status: AC
  Administered 2020-06-17: 1000 mL via INTRAVENOUS

## 2020-06-17 MED ORDER — SODIUM CHLORIDE 0.9 % IV SOLN
80.0000 mg | Freq: Once | INTRAVENOUS | Status: AC
  Administered 2020-06-17: 80 mg via INTRAVENOUS
  Filled 2020-06-17: qty 80

## 2020-06-17 MED ORDER — ONDANSETRON HCL 4 MG/2ML IJ SOLN
4.0000 mg | Freq: Once | INTRAMUSCULAR | Status: AC
  Administered 2020-06-17: 4 mg via INTRAVENOUS
  Filled 2020-06-17: qty 2

## 2020-06-17 MED ORDER — SODIUM CHLORIDE 0.9 % IV SOLN: 10.0000 mL/h | Freq: Once | INTRAVENOUS | Status: DC

## 2020-06-17 NOTE — H&P (Signed)
History and Physical   Regina Hernandez JJH:[id number] DOB: 17-Nov-1934 DOA: 06/17/2020  Referring MD/NP/PA: Dr. Kerman Passey  PCP: Elby Beck, FNP   Outpatient Specialists: None  Patient coming from: Home  Chief Complaint: Rectal bleed and melena with passing out  HPI: Regina Hernandez is a 86 y.o. female with medical history significant of diverticular disease, essential tremor, mild dementia, hypertension, degenerative disc disease, sick sinus syndrome status post pacemaker placement who apparently had a syncopal episode at home.  She was brought into the emergency room where she was evaluated.  Patient is fully awake.  She denied hitting her head.  She denied any significant chest pressure palpitation or shortness of breath.  She just felt dizzy and then passed out.  Patient seen in the ER and while in the ER noted to have hypotension with sinus tachycardia.  She was also diaphoretic.  Patient reported 5 episodes of bloody stools prior to coming in.  Still guaiac showed melena and guaiac positive.  Patient therefore suspected to have acute GI bleed leading to syncopal episode.  She is being admitted to the hospital for further work-up..  ED Course: Temperature 98.5 blood pressure 86/48 pulse 122 respiratory rate of 23 oxygen sat 90% room air.  Sodium 133 potassium 3.7 glucose 126 the rest of the chemistry within normal.  White count is 9.2 hemoglobin 8.8 with platelets 146.  Previous hemoglobin was 12.8 in February of this year, patient therefore being admitted with a diagnosis of syncope and GI bleed.  Review of Systems: As per HPI otherwise 10 point review of systems negative.    Past Medical History:  Diagnosis Date  . Anxiety   . Cervical dystonia   . Essential tremor   . Memory loss   . Migraine   . Occipital neuralgia     Past Surgical History:  Procedure Laterality Date  . BOWEL RESECTION    . CARDIAC CATHETERIZATION  2016   no stents/[facility]  .  PACEMAKER IMPLANT    . REPLACEMENT TOTAL KNEE Left      reports that she has never smoked. She has never used smokeless tobacco. She reports previous alcohol use. She reports that she does not use drugs.  Allergies  Allergen Reactions  . Adhesive [Tape] Other (See Comments)    Contact dermatitis  . Lyrica [Pregabalin] Other (See Comments)    Unable to remember reaction.  . Baclofen Rash  . Sulfa Antibiotics Rash    Family History  Problem Relation Age of Onset  . Other Mother        "old age"  . Ulcers Father   . Stomach cancer Brother      Prior to Admission medications   Medication Sig Start Date End Date Taking? Authorizing Provider  ASPIRIN 81 PO Take 1 tablet by mouth daily.    [provider]  Cholecalciferol (VITAMIN D3 PO) Take 25 mcg by mouth daily.    [provider]  Docusate Calcium (STOOL SOFTENER PO) Take by mouth as needed.    [provider]  estradiol (ESTRACE) 0.1 MG/GM vaginal cream Place 1 Applicatorful vaginally as needed.    [provider]  fluvoxaMINE (LUVOX) 50 MG tablet Take 50 mg by mouth 2 (two) times daily.    [provider]  gabapentin (NEURONTIN) 400 MG capsule Take 1 capsule (400 mg total) by mouth 3 (three) times daily. 04/22/20   Suzzanne Cloud, NP  meloxicam (MOBIC) 7.5 MG tablet Take 7.5 mg by mouth  2 (two) times daily.    [provider]  memantine (NAMENDA) 10 MG tablet TAKE 1 TABLET BY MOUTH TWICE A DAY 02/26/20   Marcial Pacas, MD  primidone (MYSOLINE) 50 MG tablet Take 2 tablets (100 mg total) by mouth at bedtime. 04/22/20   Suzzanne Cloud, NP  risperiDONE (RISPERDAL) 2 MG tablet Take 1 mg by mouth at bedtime.     [provider]  sucralfate (CARAFATE) 1 g tablet Take 1 g by mouth 3 (three) times daily. May take up to four times daily.    [provider]  venlafaxine (EFFEXOR) 75 MG tablet Take 75 mg by mouth daily.    [provider]  vitamin B-12  (CYANOCOBALAMIN) 1000 MCG tablet Take 1,000 mcg by mouth daily.    [provider]    Physical Exam: Vitals:   06/17/20 1859 06/17/20 1920 06/17/20 1950 06/17/20 2040  BP:  102/69 108/66 117/77  Pulse:  92 90 75  Resp:  (!) 22 15 (!) 23  Temp:      TempSrc:      SpO2:  100% 98% 100%  Weight: 59 kg     Height: 5\' 3"  (1.6 m)         Constitutional: Chronically ill looking no distress Vitals:   06/17/20 1859 06/17/20 1920 06/17/20 1950 06/17/20 2040  BP:  102/69 108/66 117/77  Pulse:  92 90 75  Resp:  (!) 22 15 (!) 23  Temp:      TempSrc:      SpO2:  100% 98% 100%  Weight: 59 kg     Height: 5\' 3"  (1.6 m)      Eyes: PERRL, lids and conjunctivae normal ENMT: Mucous membranes are moist. Posterior pharynx clear of any exudate or lesions.Normal dentition.  Neck: normal, supple, no masses, no thyromegaly Respiratory: clear to auscultation bilaterally, no wheezing, no crackles. Normal respiratory effort. No accessory muscle use.  Cardiovascular: Sinus tachycardia, no murmurs / rubs / gallops. No extremity edema. 2+ pedal pulses. No carotid bruits.  Abdomen: Mild diffuse tenderness, no masses palpated. No hepatosplenomegaly. Bowel sounds positive.  Musculoskeletal: no clubbing / cyanosis. No joint deformity upper and lower extremities. Good ROM, no contractures. Normal muscle tone.  Skin: no rashes, lesions, ulcers. No induration Neurologic: CN 2-12 grossly intact. Sensation intact, DTR normal. Strength 5/5 in all 4.  Psychiatric: Normal judgment and insight. Alert and oriented x 3. Normal mood.     Labs on Admission: I have personally reviewed following labs and imaging studies  CBC: Recent Labs  Lab 06/17/20 1918  WBC 9.2  HGB 8.8*  HCT 26.5*  MCV 91.7  PLT 431*   Basic Metabolic Panel: Recent Labs  Lab 06/17/20 1918  NA 133*  K 3.7  CL 102  CO2 22  GLUCOSE 126*  BUN 56*  CREATININE 0.93  CALCIUM 9.1   GFR: Estimated Creatinine Clearance: 37.2  mL/min (by C-G formula based on SCr of 0.93 mg/dL). Liver Function Tests: Recent Labs  Lab 06/17/20 1918  AST 19  ALT 12  ALKPHOS 40  BILITOT 0.6  PROT 6.3*  ALBUMIN 3.7   No results for input(s): LIPASE, AMYLASE in the last 168 hours. No results for input(s): AMMONIA in the last 168 hours. Coagulation Profile: Recent Labs  Lab 06/17/20 1918  INR 1.1   Cardiac Enzymes: No results for input(s): CKTOTAL, CKMB, CKMBINDEX, TROPONINI in the last 168 hours. BNP (last 3 results) No results for input(s): PROBNP in the last  8760 hours. HbA1C: No results for input(s): HGBA1C in the last 72 hours. CBG: Recent Labs  Lab 06/17/20 1856  GLUCAP 123*   Lipid Profile: No results for input(s): CHOL, HDL, LDLCALC, TRIG, CHOLHDL, LDLDIRECT in the last 72 hours. Thyroid Function Tests: No results for input(s): TSH, T4TOTAL, FREET4, T3FREE, THYROIDAB in the last 72 hours. Anemia Panel: No results for input(s): VITAMINB12, FOLATE, FERRITIN, TIBC, IRON, RETICCTPCT in the last 72 hours. Urine analysis:    Component Value Date/Time   APPEARANCEUR Hazy (A) 02/29/2020 0927   GLUCOSEU Negative 02/29/2020 0927   BILIRUBINUR Negative 02/29/2020 0927   PROTEINUR Negative 02/29/2020 0927   NITRITE Negative 02/29/2020 0927   LEUKOCYTESUR Negative 02/29/2020 0927   Sepsis Labs: @LABRCNTIP (procalcitonin:4,lacticidven:4) )No results found for this or any previous visit (from the past 240 hour(s)).   Radiological Exams on Admission: No results found.  EKG: Independently reviewed.  Sinus tachycardia  Assessment/Plan Principal Problem:   Melena Active Problems:   Memory loss   Depression   Chronic headaches   Pacemaker   Sick sinus syndrome (HCC)   Stage 3 chronic kidney disease   Essential tremor     #1 GI bleed: Combination of bright red blood per rectum and some melena.  Most likely cause of patient's GI bleed.  Lower GI bleed suspected with her history of diverticulosis.  At this  point we will keep her n.p.o.  IV Protonix,.  Transfused 2 units of packed red blood cells, serial CBCs.  GI consult in the morning  #2 chronic kidney disease stage III: Stable at baseline  #3 sick sinus syndrome: Status post pacemaker placement and patient doing better.  #4 syncopal episode: Suspected secondary to GI bleed.  At this point continue close monitoring.  #5 chronic headaches: Continue home regimen.  #6 essential tremors: Continue to monitor.  #7 mild memory impairment: Patient fully awake and alert and very much with it..   DVT prophylaxis: SCD Code Status: Full code Family Communication: No family at bedside Disposition Plan: To be determined Consults called: GI consult in the morning Admission status: Inpatient  Severity of Illness: The appropriate patient status for this patient is INPATIENT. Inpatient status is judged to be reasonable and necessary in order to provide the required intensity of service to ensure the patient's safety. The patient's presenting symptoms, physical exam findings, and initial radiographic and laboratory data in the context of their chronic comorbidities is felt to place them at high risk for further clinical deterioration. Furthermore, it is not anticipated that the patient will be medically stable for discharge from the hospital within 2 midnights of admission. The following factors support the patient status of inpatient.   " The patient's presenting symptoms include rectal bleed. " The worrisome physical exam findings include weakness with some mild abdominal distention. " The initial radiographic and laboratory data are worrisome because of drop in hemoglobin with guaiac positive stool. " The chronic co-morbidities include diverticulosis.   * I certify that at the point of admission it is my clinical judgment that the patient will require inpatient hospital care spanning beyond 2 midnights from the point of admission due to high intensity  of service, high risk for further deterioration and high frequency of surveillance required.Barbette Merino MD [facility] Pager [phone]  If 7PM-7AM, please contact night-coverage [email or web address] Password [facility]  06/17/2020, 8:58 PM

## 2020-06-17 NOTE — ED Triage Notes (Signed)
Pt here for syncope today. Appears pale. Hypotensive. Diaphoretic.

## 2020-06-17 NOTE — ED Notes (Signed)
Daughter who is POA arrives and states that she was concerned about UTI due to hx of UTI and recent decline including some confusion and weakness(this confusion is described as intermittent and "something just not right" and the weakness as being mild)

## 2020-06-17 NOTE — ED Provider Notes (Signed)
[facility] Emergency Department Provider Note  Time seen: 7:18 PM  I have reviewed the triage vital signs and the nursing notes.   HISTORY  Chief Complaint Loss of Consciousness and Rectal Bleeding   HPI Regina Hernandez is a 86 y.o. female with a past medical history anxiety, migraines, presents to the emergency department after syncopal episode at home.  According to the patient she believes she passed out at home, does not believe she hit her head.  Patient states she has passed out previously but is been many years.  Patient has a pacemaker/AICD for sick sinus syndrome/bradycardia per record review.   Upon arrival patient mildly diaphoretic hypotensive 86/48 with a pulse rate in the 120s.  Denies any chest pain or shortness of breath.  Patient does states she has had 5 dark loose bowel movements today.  States nausea but denies vomiting.  Past Medical History:  Diagnosis Date  . Anxiety   . Cervical dystonia   . Essential tremor   . Memory loss   . Migraine   . Occipital neuralgia     Patient Active Problem List   Diagnosis Date Noted  . Essential tremor   . Arachnoid cyst 02/11/2020  . Chronic headaches 02/11/2020  . Coccydynia 02/11/2020  . H/O staphylococcal infection 02/11/2020  . Migraine 02/11/2020  . Vertigo 02/11/2020  . Memory loss 02/04/2020  . Gait abnormality 02/04/2020  . Depression 02/04/2020  . Confusion 02/04/2020  . Major depression with psychotic features ([place]) 10/31/2019  . History of suicide attempt 10/30/2019  . Iron deficiency anemia 05/22/2019  . Status post left knee replacement 11/20/2018  . Pain of cervical facet joint 07/13/2018  . Arthritis 06/01/2018  . Tremor 06/01/2018  . Spondylosis without myelopathy or radiculopathy, cervical region 01/19/2017  . Diverticulosis of large intestine without hemorrhage 11/30/2016  . Stage 3 chronic kidney disease 11/30/2016  . Plantar fasciitis of left foot 09/29/2016  . Left knee  pain 11/26/2014  . Polyp of colon 11/01/2013  . Hypertension 03/01/2012  . Pacemaker 01/26/2012  . History of endocarditis 09/15/2011  . Coronary atherosclerosis 08/17/2011  . Sick sinus syndrome ([place]) 08/17/2011  . Barrett's esophagus without dysplasia 07/20/2010  . Microscopic hematuria 07/20/2010  . Recurrent UTI 07/20/2010  . Vestibulitis, vulvar 07/20/2010    Past Surgical History:  Procedure Laterality Date  . BOWEL RESECTION    . CARDIAC CATHETERIZATION  2016   no stents/[facility]  . PACEMAKER IMPLANT    . REPLACEMENT TOTAL KNEE Left     Prior to Admission medications   Medication Sig Start Date End Date Taking? Authorizing Provider  ASPIRIN 81 PO Take 1 tablet by mouth daily.    [provider]  Cholecalciferol (VITAMIN D3 PO) Take 25 mcg by mouth daily.    [provider]  Docusate Calcium (STOOL SOFTENER PO) Take by mouth as needed.    [provider]  estradiol (ESTRACE) 0.1 MG/GM vaginal cream Place 1 Applicatorful vaginally as needed.    [provider]  fluvoxaMINE (LUVOX) 50 MG tablet Take 50 mg by mouth 2 (two) times daily.    [provider]  gabapentin (NEURONTIN) 400 MG capsule Take 1 capsule (400 mg total) by mouth 3 (three) times daily. 04/22/20   Suzzanne Cloud, NP  meloxicam (MOBIC) 7.5 MG tablet Take 7.5 mg by mouth 2 (two) times daily.    [provider]  memantine (NAMENDA) 10 MG tablet TAKE 1 TABLET BY MOUTH TWICE A  DAY 02/26/20   Marcial Pacas, MD  primidone (MYSOLINE) 50 MG tablet Take 2 tablets (100 mg total) by mouth at bedtime. 04/22/20   Suzzanne Cloud, NP  risperiDONE (RISPERDAL) 2 MG tablet Take 1 mg by mouth at bedtime.     [provider]  sucralfate (CARAFATE) 1 g tablet Take 1 g by mouth 3 (three) times daily. May take up to four times daily.    [provider]  venlafaxine (EFFEXOR) 75 MG tablet Take 75 mg by mouth daily.    [provider]  vitamin  B-12 (CYANOCOBALAMIN) 1000 MCG tablet Take 1,000 mcg by mouth daily.    [provider]    Allergies  Allergen Reactions  . Adhesive [Tape] Other (See Comments)    Contact dermatitis  . Lyrica [Pregabalin] Other (See Comments)    Unable to remember reaction.  . Baclofen Rash  . Sulfa Antibiotics Rash    Family History  Problem Relation Age of Onset  . Other Mother        "old age"  . Ulcers Father   . Stomach cancer Brother     Social History Social History   Tobacco Use  . Smoking status: Never Smoker  . Smokeless tobacco: Never Used  Vaping Use  . Vaping Use: Never used  Substance Use Topics  . Alcohol use: Not Currently  . Drug use: Never    Review of Systems Constitutional: Negative for fever.  Positive loss of consciousness at home. Cardiovascular: Negative for chest pain. Respiratory: Negative for shortness of breath. Gastrointestinal: Negative for abdominal pain.  Positive for dark stool today. Musculoskeletal: Negative for musculoskeletal complaints Neurological: Mild headache but states this is chronic times years. All other ROS negative  ____________________________________________   PHYSICAL EXAM:  VITAL SIGNS: ED Triage Vitals  Enc Vitals Group     BP 06/17/20 1858 (!) 86/48     Pulse Rate 06/17/20 1858 (!) 122     Resp 06/17/20 1858 18     Temp 06/17/20 1858 98.5 F (36.9 C)     Temp Source 06/17/20 1858 Oral     SpO2 06/17/20 1858 90 %     Weight 06/17/20 1859 130 lb (59 kg)     Height 06/17/20 1859 5\' 3"  (1.6 m)     Head Circumference --      Peak Flow --      Pain Score 06/17/20 1857 6     Pain Loc --      Pain Edu? --      Excl. in [place]? --     Constitutional: Alert and oriented. Well appearing and in no distress. Eyes: Normal exam ENT      Head: Normocephalic and atraumatic.      Mouth/Throat: Mucous membranes are moist. Cardiovascular: Normal rate, regular rhythm around 100 bpm. Respiratory: Normal respiratory effort  without tachypnea nor retractions. Breath sounds are clear  Gastrointestinal: Soft and nontender. No distention. Musculoskeletal: Nontender with normal range of motion in all extremities.  Neurologic:  Normal speech and language. No gross focal neurologic deficits  Skin:  Skin is warm.  Slight diaphoresis.  Pale in appearance. Psychiatric: Mood and affect are normal.   ____________________________________________    EKG  EKG viewed and interpreted by myself shows sinus tachycardia at 103 bpm with a narrow QRS, normal axis, largely normal intervals with nonspecific ST changes.  ____________________________________________    INITIAL IMPRESSION / ASSESSMENT AND PLAN / ED COURSE  Pertinent labs & imaging results  that were available during my care of the patient were reviewed by me and considered in my medical decision making (see chart for details).   Patient presents emergency department for syncopal episode at home.  Patient currently hypotensive 86/48 slightly tachycardic around 100 bpm.  Patient reports 5 episodes of loose stool at home.  Rectal exam shows jet black stool strongly guaiac positive.  Highly suspect GI bleed leading to syncopal event.  We will check labs, type and screen, INR.  I have already verbally consented the patient for transfusion if needed.  We will dose IV fluids while awaiting lab results.  Patient agreeable to plan of care.  Patient's labs show a hemoglobin of 8.8 down from approximately 12.5. Given the acute drop in hemoglobin we will transfuse 1 unit of blood. We will start the patient on a Protonix infusion and admit to the hospitalist service. Patient agreeable to plan of care. Blood pressure improving with IV fluids currently 102/69. Pulse rate improving as well.  Regina Hernandez was evaluated in Emergency Department on 06/17/2020 for the symptoms described in the history of present illness. She was evaluated in the context of the global COVID-19 pandemic, which  necessitated consideration that the patient might be at risk for infection with the SARS-CoV-2 virus that causes COVID-19. Institutional protocols and algorithms that pertain to the evaluation of patients at risk for COVID-19 are in a state of rapid change based on information released by regulatory bodies including the CDC and federal and state organizations. These policies and algorithms were followed during the patient's care in the ED.  CRITICAL CARE Performed by: Harvest Dark   Total critical care time: 30 minutes  Critical care time was exclusive of separately billable procedures and treating other patients.  Critical care was necessary to treat or prevent imminent or life-threatening deterioration.  Critical care was time spent personally by me on the following activities: development of treatment plan with patient and/or surrogate as well as nursing, discussions with consultants, evaluation of patient's response to treatment, examination of patient, obtaining history from patient or surrogate, ordering and performing treatments and interventions, ordering and review of laboratory studies, ordering and review of radiographic studies, pulse oximetry and re-evaluation of patient's condition.   ____________________________________________   FINAL CLINICAL IMPRESSION(S) / ED DIAGNOSES  GI bleed Syncope Hypotension   Harvest Dark, MD 06/17/20 [phone]

## 2020-06-17 NOTE — ED Notes (Signed)
Blood transfusion started after VS were obtained and consent was signed by [facility]

## 2020-06-17 NOTE — ED Notes (Signed)
Pt tolerating transfusion well with no complaints so far

## 2020-06-17 NOTE — ED Notes (Signed)
This tech and Mateo Flow, RN performed inc care and dry brief applied. Warm blankets were given to pt.

## 2020-06-18 ENCOUNTER — Encounter
Admission: EM | Disposition: A | Discharge status: Home / Self Care | Payer: Self-pay | Source: Home / Self Care | Attending: Internal Medicine

## 2020-06-18 ENCOUNTER — Inpatient Hospital Stay: Payer: Medicare Other | Admitting: Anesthesiology

## 2020-06-18 ENCOUNTER — Encounter: Payer: Self-pay | Admitting: Internal Medicine

## 2020-06-18 DIAGNOSIS — K922 Gastrointestinal hemorrhage, unspecified: Secondary | ICD-10-CM | POA: Diagnosis present

## 2020-06-18 HISTORY — PX: ESOPHAGOGASTRODUODENOSCOPY: SHX5428

## 2020-06-18 LAB — COMPREHENSIVE METABOLIC PANEL
ALT: 10 U/L (ref 0–44)
AST: 13 U/L — ABNORMAL LOW (ref 15–41)
Albumin: 3.1 g/dL — ABNORMAL LOW (ref 3.5–5.0)
Alkaline Phosphatase: 28 U/L — ABNORMAL LOW (ref 38–126)
Anion gap: 6 (ref 5–15)
BUN: 35 mg/dL — ABNORMAL HIGH (ref 8–23)
CO2: 24 mmol/L (ref 22–32)
Calcium: 8.5 mg/dL — ABNORMAL LOW (ref 8.9–10.3)
Chloride: 109 mmol/L (ref 98–111)
Creatinine, Ser: 0.84 mg/dL (ref 0.44–1.00)
GFR calc Af Amer: >60 mL/min (ref >60)
GFR calc non Af Amer: >60 mL/min (ref >60)
Glucose, Bld: 94 mg/dL (ref 70–99)
Potassium: 3.9 mmol/L (ref 3.5–5.1)
Sodium: 139 mmol/L (ref 135–145)
Total Bilirubin: 1.2 mg/dL (ref 0.3–1.2)
Total Protein: 5.1 g/dL — ABNORMAL LOW (ref 6.5–8.1)

## 2020-06-18 LAB — URINALYSIS, ROUTINE W REFLEX MICROSCOPIC
Bilirubin Urine: NEGATIVE
Glucose, UA: NEGATIVE mg/dL
Ketones, ur: NEGATIVE mg/dL
Nitrite: NEGATIVE
Protein, ur: NEGATIVE mg/dL
Specific Gravity, Urine: 1.013 (ref 1.005–1.030)
pH: 5 (ref 5.0–8.0)

## 2020-06-18 LAB — CBC
HCT: 25.3 % — ABNORMAL LOW (ref 36.0–46.0)
HCT: 25.9 % — ABNORMAL LOW (ref 36.0–46.0)
HCT: 25.6 % — ABNORMAL LOW (ref 36.0–46.0)
Hemoglobin: 8.8 g/dL — ABNORMAL LOW (ref 12.0–15.0)
Hemoglobin: 8.8 g/dL — ABNORMAL LOW (ref 12.0–15.0)
Hemoglobin: 8.8 g/dL — ABNORMAL LOW (ref 12.0–15.0)
MCH: 32 pg (ref 26.0–34.0)
MCH: 31.7 pg (ref 26.0–34.0)
MCH: 31.9 pg (ref 26.0–34.0)
MCHC: 34.8 g/dL (ref 30.0–36.0)
MCHC: 34 g/dL (ref 30.0–36.0)
MCHC: 34.4 g/dL (ref 30.0–36.0)
MCV: 92 fL (ref 80.0–100.0)
MCV: 93.2 fL (ref 80.0–100.0)
MCV: 92.8 fL (ref 80.0–100.0)
Platelets: 113 10*3/uL — ABNORMAL LOW (ref 150–400)
Platelets: 112 10*3/uL — ABNORMAL LOW (ref 150–400)
Platelets: 103 10*3/uL — ABNORMAL LOW (ref 150–400)
RBC: 2.75 MIL/uL — ABNORMAL LOW (ref 3.87–5.11)
RBC: 2.78 MIL/uL — ABNORMAL LOW (ref 3.87–5.11)
RBC: 2.76 MIL/uL — ABNORMAL LOW (ref 3.87–5.11)
RDW: 13.2 % (ref 11.5–15.5)
RDW: 13.5 % (ref 11.5–15.5)
RDW: 14.1 % (ref 11.5–15.5)
WBC: 8.4 10*3/uL (ref 4.0–10.5)
WBC: 6.9 10*3/uL (ref 4.0–10.5)
WBC: 8.5 10*3/uL (ref 4.0–10.5)
nRBC: 0 % (ref 0.0–0.2)
nRBC: 0 % (ref 0.0–0.2)
nRBC: 0 % (ref 0.0–0.2)

## 2020-06-18 LAB — TYPE AND SCREEN
ABO/RH(D): A POS
Antibody Screen: NEGATIVE
Unit division: 0

## 2020-06-18 LAB — BPAM RBC
Blood Product Expiration Date: 202107202359
ISSUE DATE / TIME: 202106222159
Unit Type and Rh: 6200

## 2020-06-18 SURGERY — EGD (ESOPHAGOGASTRODUODENOSCOPY)
Anesthesia: General

## 2020-06-18 MED ORDER — SODIUM CHLORIDE 0.9 % IV SOLN: 80.0000 mg | Freq: Once | INTRAVENOUS | Status: DC

## 2020-06-18 MED ORDER — PANTOPRAZOLE SODIUM 40 MG IV SOLR: 40.0000 mg | Freq: Two times a day (BID) | INTRAVENOUS | Status: DC

## 2020-06-18 MED ORDER — VITAMIN B-12 1000 MCG PO TABS
1000.0000 ug | ORAL_TABLET | Freq: Every day | ORAL | Status: DC
  Administered 2020-06-19: 11:00:00 1000 ug via ORAL
  Filled 2020-06-18: qty 1

## 2020-06-18 MED ORDER — ACETAMINOPHEN 500 MG PO TABS
1000.0000 mg | ORAL_TABLET | Freq: Four times a day (QID) | ORAL | Status: DC | PRN
  Administered 2020-06-18: 1000 mg via ORAL
  Filled 2020-06-18: qty 2

## 2020-06-18 MED ORDER — RISPERIDONE 1 MG PO TABS
1.0000 mg | ORAL_TABLET | Freq: Every day | ORAL | Status: DC
  Administered 2020-06-18: 22:00:00 1 mg via ORAL
  Filled 2020-06-18 (×2): qty 1

## 2020-06-18 MED ORDER — ONDANSETRON HCL 4 MG PO TABS: 4.0000 mg | ORAL_TABLET | Freq: Four times a day (QID) | ORAL | Status: DC | PRN

## 2020-06-18 MED ORDER — SODIUM CHLORIDE 0.9 % IV SOLN: INTRAVENOUS | Status: DC

## 2020-06-18 MED ORDER — PANTOPRAZOLE SODIUM 40 MG PO TBEC
40.0000 mg | DELAYED_RELEASE_TABLET | Freq: Two times a day (BID) | ORAL | Status: DC
  Administered 2020-06-18 – 2020-06-19 (×2): 40 mg via ORAL
  Filled 2020-06-18 (×2): qty 1

## 2020-06-18 MED ORDER — SODIUM CHLORIDE 0.9 % IV SOLN
INTRAVENOUS | Status: DC
  Administered 2020-06-18: 1000 mL via INTRAVENOUS

## 2020-06-18 MED ORDER — SODIUM CHLORIDE 0.9 % IV SOLN: 8.0000 mg/h | INTRAVENOUS | Status: DC

## 2020-06-18 MED ORDER — NITROFURANTOIN MACROCRYSTAL 50 MG PO CAPS
50.0000 mg | ORAL_CAPSULE | Freq: Every day | ORAL | Status: DC
  Administered 2020-06-18: 22:00:00 50 mg via ORAL
  Filled 2020-06-18 (×2): qty 1

## 2020-06-18 MED ORDER — FLUVOXAMINE MALEATE 50 MG PO TABS
50.0000 mg | ORAL_TABLET | Freq: Two times a day (BID) | ORAL | Status: DC
  Administered 2020-06-18 – 2020-06-19 (×2): 50 mg via ORAL
  Filled 2020-06-18 (×3): qty 1

## 2020-06-18 MED ORDER — ACETAMINOPHEN 500 MG PO TABS: 1000.0000 mg | ORAL_TABLET | Freq: Three times a day (TID) | ORAL | Status: DC | PRN

## 2020-06-18 MED ORDER — GLYCOPYRROLATE 0.2 MG/ML IJ SOLN
INTRAMUSCULAR | Status: DC | PRN
  Administered 2020-06-18: .2 mg via INTRAVENOUS

## 2020-06-18 MED ORDER — VENLAFAXINE HCL ER 75 MG PO CP24
75.0000 mg | ORAL_CAPSULE | Freq: Every day | ORAL | Status: DC
  Administered 2020-06-19: 75 mg via ORAL
  Filled 2020-06-18: qty 1

## 2020-06-18 MED ORDER — PRIMIDONE 50 MG PO TABS
100.0000 mg | ORAL_TABLET | Freq: Every day | ORAL | Status: DC
  Administered 2020-06-18: 22:00:00 100 mg via ORAL
  Filled 2020-06-18 (×2): qty 2

## 2020-06-18 MED ORDER — ONDANSETRON HCL 4 MG/2ML IJ SOLN: 4.0000 mg | Freq: Four times a day (QID) | INTRAMUSCULAR | Status: DC | PRN

## 2020-06-18 MED ORDER — FENTANYL CITRATE (PF) 100 MCG/2ML IJ SOLN
INTRAMUSCULAR | Status: DC | PRN
  Administered 2020-06-18: 25 ug via INTRAVENOUS

## 2020-06-18 MED ORDER — PROPOFOL 500 MG/50ML IV EMUL
INTRAVENOUS | Status: DC | PRN
  Administered 2020-06-18: 125 ug/kg/min via INTRAVENOUS
  Administered 2020-06-18: 30 mg via INTRAVENOUS

## 2020-06-18 MED ORDER — FENTANYL CITRATE (PF) 100 MCG/2ML IJ SOLN
INTRAMUSCULAR | Status: AC
  Filled 2020-06-18: qty 2

## 2020-06-18 MED ORDER — MEMANTINE HCL 5 MG PO TABS
10.0000 mg | ORAL_TABLET | Freq: Two times a day (BID) | ORAL | Status: DC
  Administered 2020-06-18 – 2020-06-19 (×2): 10 mg via ORAL
  Filled 2020-06-18 (×2): qty 2

## 2020-06-18 NOTE — Transfer of Care (Signed)
Immediate Anesthesia Transfer of Care Note  Patient: Regina Hernandez  Procedure(s) Performed: ESOPHAGOGASTRODUODENOSCOPY (EGD) (N/A )  Patient Location: PACU  Anesthesia Type:MAC  Level of Consciousness: drowsy and patient cooperative  Airway & Oxygen Therapy: Patient connected to nasal cannula oxygen  Post-op Assessment: Report given to RN and Post -op Vital signs reviewed and stable  Post vital signs: Reviewed and stable  Last Vitals:  Vitals Value Taken Time  BP 95/62 06/18/20 1653  Temp    Pulse 83 06/18/20 1654  Resp 12 06/18/20 1654  SpO2 99 % 06/18/20 1654  Vitals shown include unvalidated device data.  Last Pain:  Vitals:   06/18/20 1525  TempSrc: Temporal  PainSc: 0-No pain         Complications: No complications documented.

## 2020-06-18 NOTE — ED Notes (Addendum)
This RN called endoscopy and  spoke with RN Tammy.

## 2020-06-18 NOTE — Op Note (Signed)
[facility] Gastroenterology Patient Name: Regina Hernandez Procedure Date: 06/18/2020 4:41 PM MRN: [id number] Account #: [id number] Date of Birth: [birth year] Admit Type: Inpatient Age: 86 Room: [facility] ENDO ROOM 3 Gender: Female Note Status: Finalized Procedure:             Upper GI endoscopy Indications:           Acute post hemorrhagic anemia, Personal history of                         peptic ulcer disease Providers:             Benay Pike. Alice Reichert MD, MD Referring MD:          Elby Beck (Referring MD) Medicines:             Propofol per Anesthesia Complications:         No immediate complications. Estimated blood loss: None. Procedure:             Pre-Anesthesia Assessment:                        - The risks and benefits of the procedure and the                         sedation options and risks were discussed with the                         patient. All questions were answered and informed                         consent was obtained.                        - Patient identification and proposed procedure were                         verified prior to the procedure by the nurse. The                         procedure was verified in the procedure room.                        - ASA Grade Assessment: III - A patient with severe                         systemic disease.                        - After reviewing the risks and benefits, the patient                         was deemed in satisfactory condition to undergo the                         procedure.                        After obtaining informed consent, the endoscope was                         passed  under direct vision. Throughout the procedure,                         the patient's blood pressure, pulse, and oxygen                         saturations were monitored continuously. The Endoscope                         was introduced through the mouth, and advanced to the                         third  part of duodenum. The upper GI endoscopy was                         accomplished without difficulty. The patient tolerated                         the procedure well. Findings:      The examined esophagus was normal.      Patchy moderate inflammation characterized by congestion (edema),       erosions and erythema was found in the gastric antrum.      Four non-bleeding cratered gastric ulcers with a clean ulcer base       (Forrest Class III) were found in the prepyloric region of the stomach.       The largest lesion was 10 mm in largest dimension.      The cardia and gastric fundus were normal on retroflexion.      The examined duodenum was normal.      The exam was otherwise without abnormality. Impression:            - Normal esophagus.                        - Gastritis.                        - Non-bleeding gastric ulcers with a clean ulcer base                         (Forrest Class III).                        - Normal examined duodenum.                        - The examination was otherwise normal.                        - No specimens collected. Recommendation:        - Clear liquid diet.                        - Continue present medications.                        - No aspirin, ibuprofen, naproxen, or other                         non-steroidal anti-inflammatory drugs.                        -  Use Protonix (pantoprazole) 40 mg PO daily.                        - Check stool H Pylori Ag.                        - Follow up with GI Octavia Bruckner, PA-C) at [place] in 3 weeks.                        - Advance diet from clears to regular as tolerated.                        GI sign off. Procedure Code(s):     --- Professional ---                        [phone], Esophagogastroduodenoscopy, flexible,                         transoral; diagnostic, including collection of                         specimen(s) by brushing or washing, when performed                          (separate procedure) Diagnosis Code(s):     --- Professional ---                        Z87.11, Personal history of peptic ulcer disease                        D62, Acute posthemorrhagic anemia                        K25.9, Gastric ulcer, unspecified as acute or chronic,                         without hemorrhage or perforation                        K29.70, Gastritis, unspecified, without bleeding CPT copyright 2019 American Medical Association. All rights reserved. The codes documented in this report are preliminary and upon coder review may  be revised to meet current compliance requirements. Efrain Sella MD, MD 06/18/2020 4:55:44 PM This report has been signed electronically. Number of Addenda: 0 Note Initiated On: 06/18/2020 4:41 PM Estimated Blood Loss:  Estimated blood loss: none.      [facility]

## 2020-06-18 NOTE — ED Notes (Signed)
Pt taken to endoscopy

## 2020-06-18 NOTE — Progress Notes (Signed)
PROGRESS NOTE    Regina Hernandez  DJT:[id number] DOB: Dec 18, 1934 DOA: 06/17/2020 PCP: Elby Beck, FNP    Brief Narrative:  86 year old female with history of diverticular disease, mild dementia, hypertension, degenerative disease on NSAID's, sick sinus syndrome status post pacemaker and migraine presented to the emergency room with multiple episodes of dark bowel movements and episode of syncope while at bathroom. In the emergency room, she was tachycardic and hypotensive with blood pressure less than 80.  Hemoglobin 8.8, last known hemoglobin 12.84 months ago.  Started on IV Protonix, given 2 units of PRBC and admitted to the hospital.  She does have history of Barrett's esophagus and multiple benign appearing ulcers in the stomach and duodenum, last endoscopy was done in 7/20.  Patient takes Mobic twice daily and recently started taking Aleve 1 or 2 tablets daily for neck pain and back pain.  Recently moved with her daughter.   Assessment & Plan:   Principal Problem:   Melena Active Problems:   Memory loss   Depression   Chronic headaches   Pacemaker   Sick sinus syndrome (HCC)   Stage 3 chronic kidney disease   Essential tremor   Acute GI bleeding  Symptomatic anemia, anemia of acute blood loss, suspect upper GI bleeding.  Suspect peptic ulcer disease and bleeding ulcer with history of peptic ulcer and use of NSAIDs. Patient resuscitated with 1 units of PRBC, hemoglobin 8.8-8.8 and is stable.  Also received isotonic fluid. Currently NPO.  Called and discussed with gastroenterology, going for upper GI endoscopy today. Hemoglobin and hematocrit every 6 hours and transfuse for less than 7. Continue Protonix, will need long-term Protonix.  Sick sinus syndrome status post pacemaker: Stable.  Memory loss/early dementia with history of depression: Patient on multiple medications including Namenda, venlafaxine, risperidone and primidone, fluvoxamine that we will resume.  We will  hold her pain medication specifically NSAIDs.   DVT prophylaxis: SCDs Start: 06/18/20 0135   Code Status: Full code Family Communication: Daughter at the bedside Disposition Plan: Status is: Inpatient  Remains inpatient appropriate because:Ongoing diagnostic testing needed not appropriate for outpatient work up and Inpatient level of care appropriate due to severity of illness   Dispo: The patient is from: Home              Anticipated d/c is to: Home              Anticipated d/c date is: 2 days              Patient currently is not medically stable to d/c.  Patient is scheduled for endoscopy today.       Consultants:   Gastroenterology  Procedures:   EGD, planned  Antimicrobials:   None   Subjective: Patient seen and examined in the morning rounds.  Her daughter was at the bedside. Patient received 1 unit of PRBC since admission.  Hemoglobin is similar after transfusion.  She was resting in bed and denied any dizziness or lightheadedness.  Denies any abdominal pain.  Objective: Vitals:   06/18/20 0645 06/18/20 1230 06/18/20 1401 06/18/20 1525  BP: 123/70 132/71 (!) 142/77 129/81  Pulse: 67 66 63 67  Resp: 17 20 (!) 22 17  Temp:    97.7 F (36.5 C)  TempSrc:    Temporal  SpO2: 100% 100% 100% 97%  Weight:    59.9 kg  Height:    5\' 3"  (1.6 m)    Intake/Output Summary (Last 24 hours) at 06/18/2020 1631 Last  data filed at 06/18/2020 0128 Gross per 24 hour  Intake 820 ml  Output --  Net 820 ml   Filed Weights   06/17/20 1859 06/18/20 1525  Weight: 59 kg 59.9 kg    Examination:  General exam: Appears calm and comfortable  Respiratory system: Clear to auscultation. Respiratory effort normal. Cardiovascular system: S1 & S2 heard, RRR.  Patient has pacemaker on left precordium nontender. Gastrointestinal system: Abdomen is nondistended, soft and nontender. No organomegaly or masses felt. Normal bowel sounds heard. Central nervous system: Alert and  oriented. No focal neurological deficits.     Data Reviewed: I have personally reviewed following labs and imaging studies  CBC: Recent Labs  Lab 06/17/20 1918 06/18/20 0142 06/18/20 0722  WBC 9.2 8.4 6.9  HGB 8.8* 8.8* 8.8*  HCT 26.5* 25.3* 25.9*  MCV 91.7 92.0 93.2  PLT 146* 113* 096*   Basic Metabolic Panel: Recent Labs  Lab 06/17/20 1918 06/18/20 0608  NA 133* 139  K 3.7 3.9  CL 102 109  CO2 22 24  GLUCOSE 126* 94  BUN 56* 35*  CREATININE 0.93 0.84  CALCIUM 9.1 8.5*   GFR: Estimated Creatinine Clearance: 41.2 mL/min (by C-G formula based on SCr of 0.84 mg/dL). Liver Function Tests: Recent Labs  Lab 06/17/20 1918 06/18/20 0608  AST 19 13*  ALT 12 10  ALKPHOS 40 28*  BILITOT 0.6 1.2  PROT 6.3* 5.1*  ALBUMIN 3.7 3.1*   No results for input(s): LIPASE, AMYLASE in the last 168 hours. No results for input(s): AMMONIA in the last 168 hours. Coagulation Profile: Recent Labs  Lab 06/17/20 1918  INR 1.1   Cardiac Enzymes: No results for input(s): CKTOTAL, CKMB, CKMBINDEX, TROPONINI in the last 168 hours. BNP (last 3 results) No results for input(s): PROBNP in the last 8760 hours. HbA1C: No results for input(s): HGBA1C in the last 72 hours. CBG: Recent Labs  Lab 06/17/20 1856  GLUCAP 123*   Lipid Profile: No results for input(s): CHOL, HDL, LDLCALC, TRIG, CHOLHDL, LDLDIRECT in the last 72 hours. Thyroid Function Tests: No results for input(s): TSH, T4TOTAL, FREET4, T3FREE, THYROIDAB in the last 72 hours. Anemia Panel: No results for input(s): VITAMINB12, FOLATE, FERRITIN, TIBC, IRON, RETICCTPCT in the last 72 hours. Sepsis Labs: No results for input(s): PROCALCITON, LATICACIDVEN in the last 168 hours.  Recent Results (from the past 240 hour(s))  SARS Coronavirus 2 by RT PCR (hospital order, performed in [facility] hospital lab) Nasopharyngeal Nasopharyngeal Swab     Status: None   Collection Time: 06/17/20  8:13 PM   Specimen: Nasopharyngeal  Swab  Result Value Ref Range Status   SARS Coronavirus 2 NEGATIVE NEGATIVE Final    Comment: (NOTE) SARS-CoV-2 target nucleic acids are NOT DETECTED.  The SARS-CoV-2 RNA is generally detectable in upper and lower respiratory specimens during the acute phase of infection. The lowest concentration of SARS-CoV-2 viral copies this assay can detect is 250 copies / mL. A negative result does not preclude SARS-CoV-2 infection and should not be used as the sole basis for treatment or other patient management decisions.  A negative result may occur with improper specimen collection / handling, submission of specimen other than nasopharyngeal swab, presence of viral mutation(s) within the areas targeted by this assay, and inadequate number of viral copies (<250 copies / mL). A negative result must be combined with clinical observations, patient history, and epidemiological information.  Fact Sheet for Patients:   StrictlyIdeas.no  Fact Sheet for Healthcare Providers: BankingDealers.co.za  This test is not yet approved or  cleared by the [place] and has been authorized for detection and/or diagnosis of SARS-CoV-2 by FDA under an Emergency Use Authorization (EUA).  This EUA will remain in effect (meaning this test can be used) for the duration of the COVID-19 declaration under Section 564(b)(1) of the Act, 21 U.S.C. section 360bbb-3(b)(1), unless the authorization is terminated or revoked sooner.  Performed at [facility], [address], [place], [place] [address]          Radiology Studies: No results found.      Scheduled Meds:  [MAR Hold] pantoprazole  40 mg Intravenous Q12H   Continuous Infusions:  [MAR Hold] sodium chloride Stopped (06/17/20 2047)   sodium chloride Stopped (06/18/20 1256)   sodium chloride     sodium chloride 1,000 mL (06/18/20 1534)   pantoprozole (PROTONIX) infusion Stopped  (06/18/20 0722)     LOS: 1 day    Time spent: 30 minutes    Barb Merino, MD [facility] Pager [phone]

## 2020-06-18 NOTE — Consult Note (Signed)
GI Inpatient Consult Note  Reason for Consult: GI bleed 2/2 melena    Attending Requesting Consult: Dr. Barb Merino, MD  History of Present Illness: Regina Hernandez is a 86 y.o. female seen for evaluation of GI bleeding with melena at the request of Dr. Sloan Leiter. Pt has a PMH of diverticular disease, mild dementia, HTN, DDD, SSS s/p pacemaker placement, and migraines. She presented to the ED last night after dinner after having a syncopal episode at home after passing 5-7 dark BMs yesterday. She reports she woke up yesterday feeling fine, but around lunch time she noticed onset of mild lower abdominal cramping with fecal urgency and had BM and noticed it was black - "looked like chocolate." She reports she had appx 5-7 black BMs which were very loose. She started to get really dizzy and had syncopal episode. Family brought her to the ED. Upon presentation to the ED, she has tachycardic and hypotensive and had acute blood loss anemia with hemoglobin 8.8 down from baseline of 12.8 in February 2021. Per ED physician, rectal exam showed jet black stool which was guaiac positive. She was started on IV protonix and given one unit pRBCs. Patient reports prior to the ED she had one episode of coffee ground emesis. Since being in the ED she has had no recurrent bleeding episodes. Patient's daughter and HPOA is present in the room with her today. Per daughter, patient has a strong history of peptic ulcer disease "with over 20 ulcers in the past." She was seen by GI doctor at [facility] Dr. Tilman Neat 06/2019 where she had EGD showing Barrett's esophagus, H pylori negative gastritis, and multiple benign-appearing ulcers in stomach and duodenum. She takes carafate at home 2-3 times daily, but is not on any chronic acid suppression therapy. She does take Mobic twice daily and has recently started taking Aleve 1-2 tablets daily for neck and back pain. She recently had to sell her house and this was stressful. She denies  any current nausea, vomiting, dysphagia, odynophagia, early satiety, abdominal pain, diarrhea, or rectal pain. She is feeling better after transfusion and IV fluids. Unsure when last colonoscopy was, but patient reports this was appx 2 years ago and was normal. Pt is s/p right hemicolectomy.     Last Colonoscopy: Unknown Last Endoscopy:  06/2019 Findings:  Barrett's esophagus H pylori negative gastritis Mild, localized erythematous mucosa in the GE junction; performed 4 cold  biopsies. 4 bx at 1 cm intervals  Multiple small, superficial, round, benign-appearing ulcers in the cardia,   greater curve of the stomach, lesser curve of the stomach, antrum and 1st part of the duodenum with adherent clot    Past Medical History:  Past Medical History:  Diagnosis Date  . Anxiety   . Cervical dystonia   . Essential tremor   . Memory loss   . Migraine   . Occipital neuralgia     Problem List: Patient Active Problem List   Diagnosis Date Noted  . Acute GI bleeding 06/18/2020  . Melena 06/17/2020  . Essential tremor   . Arachnoid cyst 02/11/2020  . Chronic headaches 02/11/2020  . Coccydynia 02/11/2020  . H/O staphylococcal infection 02/11/2020  . Migraine 02/11/2020  . Vertigo 02/11/2020  . Memory loss 02/04/2020  . Gait abnormality 02/04/2020  . Depression 02/04/2020  . Confusion 02/04/2020  . Major depression with psychotic features ([place]) 10/31/2019  . History of suicide attempt 10/30/2019  . Iron deficiency anemia 05/22/2019  . Status post left knee replacement  11/20/2018  . Pain of cervical facet joint 07/13/2018  . Arthritis 06/01/2018  . Tremor 06/01/2018  . Spondylosis without myelopathy or radiculopathy, cervical region 01/19/2017  . Diverticulosis of large intestine without hemorrhage 11/30/2016  . Stage 3 chronic kidney disease 11/30/2016  . Plantar fasciitis of left foot 09/29/2016  . Left knee pain 11/26/2014  . Polyp of colon 11/01/2013  . Hypertension  03/01/2012  . Pacemaker 01/26/2012  . History of endocarditis 09/15/2011  . Coronary atherosclerosis 08/17/2011  . Sick sinus syndrome ([place]) 08/17/2011  . Barrett's esophagus without dysplasia 07/20/2010  . Microscopic hematuria 07/20/2010  . Recurrent UTI 07/20/2010  . Vestibulitis, vulvar 07/20/2010    Past Surgical History: Past Surgical History:  Procedure Laterality Date  . BOWEL RESECTION    . CARDIAC CATHETERIZATION  2016   no stents/[facility]  . PACEMAKER IMPLANT    . REPLACEMENT TOTAL KNEE Left     Allergies: Allergies  Allergen Reactions  . Adhesive [Tape] Other (See Comments)    Contact dermatitis  . Lyrica [Pregabalin] Other (See Comments)    Unable to remember reaction.  . Baclofen Rash  . Sulfa Antibiotics Rash    Home Medications: (Not in a hospital admission)  Home medication reconciliation was completed with the patient.   Scheduled Inpatient Medications:   . [START ON 06/21/2020] pantoprazole  40 mg Intravenous Q12H    Continuous Inpatient Infusions:   . sodium chloride Stopped (06/17/20 2047)  . sodium chloride Stopped (06/18/20 1256)  . pantoprozole (PROTONIX) infusion Stopped (06/18/20 0722)    PRN Inpatient Medications:  ondansetron **OR** ondansetron (ZOFRAN) IV  Family History: family history includes Other in her mother; Stomach cancer in her brother; Ulcers in her father.  The patient's family history is negative for inflammatory bowel disorders, GI malignancy, or solid organ transplantation.  Social History:   reports that she has never smoked. She has never used smokeless tobacco. She reports previous alcohol use. She reports that she does not use drugs. The patient denies ETOH, tobacco, or drug use.   Review of Systems: Constitutional: Weight is stable.  Eyes: No changes in vision. ENT: No oral lesions, sore throat.  GI: see HPI.  Heme/Lymph: No easy bruising.  CV: No chest pain.  GU: No hematuria.   Integumentary: No rashes.  Neuro: No headaches.  Psych: No depression/anxiety.  Endocrine: No heat/cold intolerance.  Allergic/Immunologic: No urticaria.  Resp: No cough, SOB.  Musculoskeletal: No joint swelling.    Physical Examination: BP 132/71   Pulse 66   Temp 98.9 F (37.2 C) (Oral)   Resp 20   Ht 5\' 3"  (1.6 m)   Wt 59 kg   SpO2 100%   BMI 23.03 kg/m  Gen: NAD, alert and oriented x 4 HEENT: PEERLA, EOMI, Neck: supple, no JVD or thyromegaly Chest: CTA bilaterally, no wheezes, crackles, or other adventitious sounds CV: RRR, no m/g/c/r Abd: soft, NT, ND, +BS in all four quadrants; no HSM, guarding, ridigity, or rebound tenderness Ext: no edema, well perfused with 2+ pulses, Skin: no rash or lesions noted Lymph: no LAD  Data: Lab Results  Component Value Date   WBC 6.9 06/18/2020   HGB 8.8 (L) 06/18/2020   HCT 25.9 (L) 06/18/2020   MCV 93.2 06/18/2020   PLT 112 (L) 06/18/2020   Recent Labs  Lab 06/17/20 1918 06/18/20 0142 06/18/20 0722  HGB 8.8* 8.8* 8.8*   Lab Results  Component Value Date   NA 139 06/18/2020   K 3.9  06/18/2020   CL 109 06/18/2020   CO2 24 06/18/2020   BUN 35 (H) 06/18/2020   CREATININE 0.84 06/18/2020   Lab Results  Component Value Date   ALT 10 06/18/2020   AST 13 (L) 06/18/2020   ALKPHOS 28 (L) 06/18/2020   BILITOT 1.2 06/18/2020   Recent Labs  Lab 06/17/20 1918  INR 1.1   Assessment/Plan:  86 y/o Caucasian female with a PMH of diverticular disease s/p right hemicolectomy, mild dementia, HTN, DDD, Barrett's esophagus, SSS s/p pacemaker placement, and migraines presented to the ED for GIB  1. Acute blood loss anemia - melena; DDx includes peptic ulcer disease, gastritis +/- H pylori, esophagitis, duodenitis, AVMs, malignancy, etc  2. History of peptic ulcer disease - reportedly hx of >20 ulcers in stomach and duodenum  3. Barrett's esophagus without dysplasia - reportedly not on PPI at home  4. Long-term NSAID use -  Mobic BID and Aleve recently  5. Dementia - on Namenda    COVID-19 Test: NEGATIVE   Recommendations:  -Clinical presentation and blood work is consistent with UGI bleeding, with peptic ulcer disease at top of the differential diagnosis -Agree with IV acid suppression -Continue to monitor H&H closely and transfuse for Hgb <7.0. No evidence of active bleeding at present. -Advise EGD for diagnostic and potentially therapeutic purposes this afternoon with Dr. Alice Reichert. I discussed procedure details and indications with patient and her daughter (HPOA). I reviewed the risks (including bleeding, perforation, infection, anesthesia complications, cardiac/respiratory complications), benefits and alternatives of EGD. Patient consents to proceed.  -NPO -See procedure note for findings and further recommendations -She will need to be on PPI long-term given strong hx of PUD and Barrett's esophagus    Thank you for the consult. Please call with questions or concerns.  Reeves Forth [facility] [phone] (Cell)

## 2020-06-18 NOTE — Anesthesia Preprocedure Evaluation (Signed)
Anesthesia Evaluation  Patient identified by MRN, date of birth, ID band Patient awake    Reviewed: Allergy & Precautions, NPO status , Patient's Chart, lab work & pertinent test results  History of Anesthesia Complications Negative for: history of anesthetic complications  Airway Mallampati: III       Dental  (+) Upper Dentures, Lower Dentures   Pulmonary neg sleep apnea, neg COPD,           Cardiovascular hypertension, Pt. on medications (-) Past MI and (-) CHF + pacemaker (SSS, symptomatic bradycardia) (-) Valvular Problems/Murmurs     Neuro/Psych neg Seizures Anxiety Depression    GI/Hepatic Neg liver ROS, neg GERD  ,  Endo/Other  neg diabetes  Renal/GU Renal InsufficiencyRenal disease     Musculoskeletal   Abdominal   Peds  Hematology   Anesthesia Other Findings   Reproductive/Obstetrics                             Anesthesia Physical Anesthesia Plan  ASA: III and emergent  Anesthesia Plan: General   Post-op Pain Management:    Induction: Intravenous  PONV Risk Score and Plan: 3 and Propofol infusion, TIVA and Treatment may vary due to age or medical condition  Airway Management Planned: Nasal Cannula  Additional Equipment:   Intra-op Plan:   Post-operative Plan:   Informed Consent: I have reviewed the patients History and Physical, chart, labs and discussed the procedure including the risks, benefits and alternatives for the proposed anesthesia with the patient or authorized representative who has indicated his/her understanding and acceptance.       Plan Discussed with:   Anesthesia Plan Comments:         Anesthesia Quick Evaluation

## 2020-06-19 ENCOUNTER — Encounter: Payer: Self-pay | Admitting: Internal Medicine

## 2020-06-19 LAB — CBC
HCT: 24.5 % — ABNORMAL LOW (ref 36.0–46.0)
Hemoglobin: 8.1 g/dL — ABNORMAL LOW (ref 12.0–15.0)
MCH: 31.6 pg (ref 26.0–34.0)
MCHC: 33.1 g/dL (ref 30.0–36.0)
MCV: 95.7 fL (ref 80.0–100.0)
Platelets: 101 10*3/uL — ABNORMAL LOW (ref 150–400)
RBC: 2.56 MIL/uL — ABNORMAL LOW (ref 3.87–5.11)
RDW: 13.9 % (ref 11.5–15.5)
WBC: 6.7 10*3/uL (ref 4.0–10.5)
nRBC: 0 % (ref 0.0–0.2)

## 2020-06-19 LAB — BASIC METABOLIC PANEL
Anion gap: 5 (ref 5–15)
BUN: 17 mg/dL (ref 8–23)
CO2: 26 mmol/L (ref 22–32)
Calcium: 8.5 mg/dL — ABNORMAL LOW (ref 8.9–10.3)
Chloride: 107 mmol/L (ref 98–111)
Creatinine, Ser: 0.7 mg/dL (ref 0.44–1.00)
GFR calc Af Amer: >60 mL/min (ref >60)
GFR calc non Af Amer: >60 mL/min (ref >60)
Glucose, Bld: 89 mg/dL (ref 70–99)
Potassium: 3.7 mmol/L (ref 3.5–5.1)
Sodium: 138 mmol/L (ref 135–145)

## 2020-06-19 LAB — MAGNESIUM: Magnesium: 1.9 mg/dL (ref 1.7–2.4)

## 2020-06-19 LAB — PHOSPHORUS: Phosphorus: 2.5 mg/dL (ref 2.5–4.6)

## 2020-06-19 MED ORDER — PANTOPRAZOLE SODIUM 40 MG PO TBEC: 40.0000 mg | DELAYED_RELEASE_TABLET | Freq: Two times a day (BID) | ORAL | 0 refills | Status: DC

## 2020-06-19 MED ORDER — POLYETHYLENE GLYCOL 3350 17 G PO PACK
17.0000 g | PACK | Freq: Every day | ORAL | Status: DC
  Administered 2020-06-19: 17 g via ORAL
  Filled 2020-06-19: qty 1

## 2020-06-19 MED ORDER — SODIUM CHLORIDE 0.9 % IV SOLN
510.0000 mg | Freq: Once | INTRAVENOUS | Status: AC
  Administered 2020-06-19: 15:00:00 510 mg via INTRAVENOUS
  Filled 2020-06-19: qty 17

## 2020-06-19 MED ORDER — FUROSEMIDE 10 MG/ML IJ SOLN
40.0000 mg | Freq: Once | INTRAMUSCULAR | Status: AC
  Administered 2020-06-19: 40 mg via INTRAVENOUS
  Filled 2020-06-19: qty 4

## 2020-06-19 MED ORDER — ACETAMINOPHEN 500 MG PO TABS: 1000.0000 mg | ORAL_TABLET | Freq: Three times a day (TID) | ORAL | 0 refills | Status: DC | PRN

## 2020-06-19 NOTE — Progress Notes (Signed)
GI Inpatient Follow-up Note  Subjective:  Patient seen in follow-up for acute blood loss anemia, melena. She is s/p EGD yesterday afternoon by Dr. Alice Reichert showing four non-bleeding gastric ulcers with clean ulcer base (Forrest Class III), largest lesion 10 mm in largest dimension. H pylori stool antigen pending. No acute events overnight. Hemoglobin 8.1 this morning. She is working with physical therapy after lunch today. She tolerating breakfast with no difficulties.   Scheduled Inpatient Medications:   fluvoxaMINE  50 mg Oral BID   memantine  10 mg Oral BID   nitrofurantoin  50 mg Oral QHS   pantoprazole  40 mg Oral BID   polyethylene glycol  17 g Oral Daily   primidone  100 mg Oral QHS   risperiDONE  1 mg Oral QHS   venlafaxine XR  75 mg Oral Daily   vitamin B-12  1,000 mcg Oral Daily    Continuous Inpatient Infusions:    sodium chloride Stopped (06/17/20 2047)   ferumoxytol      PRN Inpatient Medications:  acetaminophen, acetaminophen, ondansetron **OR** ondansetron (ZOFRAN) IV  Review of Systems: Constitutional: Weight is stable.  Eyes: No changes in vision. ENT: No oral lesions, sore throat.  GI: see HPI.  Heme/Lymph: No easy bruising.  CV: No chest pain.  GU: No hematuria.  Integumentary: No rashes.  Neuro: No headaches.  Psych: No depression/anxiety.  Endocrine: No heat/cold intolerance.  Allergic/Immunologic: No urticaria.  Resp: No cough, SOB.  Musculoskeletal: No joint swelling.    Physical Examination: BP (!) 139/98 (BP Location: Left Arm)    Pulse 96    Temp 98.7 F (37.1 C) (Oral)    Resp 18    Ht 5\' 3"  (1.6 m)    Wt 59.9 kg    SpO2 99%    BMI 23.38 kg/m  Gen: NAD, alert and oriented x 4 HEENT: PEERLA, EOMI, Neck: supple, no JVD or thyromegaly Chest: CTA bilaterally, no wheezes, crackles, or other adventitious sounds CV: RRR, no m/g/c/r Abd: soft, NT, ND, +BS in all four quadrants; no HSM, guarding, ridigity, or rebound tenderness Ext: no  edema, well perfused with 2+ pulses, Skin: no rash or lesions noted Lymph: no LAD  Data: Lab Results  Component Value Date   WBC 6.7 06/19/2020   HGB 8.1 (L) 06/19/2020   HCT 24.5 (L) 06/19/2020   MCV 95.7 06/19/2020   PLT 101 (L) 06/19/2020   Recent Labs  Lab 06/18/20 0722 06/18/20 1829 06/19/20 0139  HGB 8.8* 8.8* 8.1*   Lab Results  Component Value Date   NA 138 06/19/2020   K 3.7 06/19/2020   CL 107 06/19/2020   CO2 26 06/19/2020   BUN 17 06/19/2020   CREATININE 0.70 06/19/2020   Lab Results  Component Value Date   ALT 10 06/18/2020   AST 13 (L) 06/18/2020   ALKPHOS 28 (L) 06/18/2020   BILITOT 1.2 06/18/2020   Recent Labs  Lab 06/17/20 1918  INR 1.1   Assessment:  86 y/o Caucasian female with a PMH of diverticular disease s/p right hemicolectomy, mild dementia, HTN, DDD, Barrett's esophagus, SSS s/p pacemaker placement, and migraines presented to the ED for GIB  1. Acute blood loss anemia - melena; s/p EGD yesterday with Dr. Alice Reichert showing four non-bleeding gastric ulcers  2. History of peptic ulcer disease - reportedly hx of >20 ulcers in stomach and duodenum  3. Barrett's esophagus without dysplasia - reportedly not on PPI at home  4. Long-term NSAID use - Mobic BID  and Aleve recently  5. Dementia - on Namenda    Plan: 1. Reviewed EGD results with patient and daughter  2. Check H pylori stool antigen  3. Remain on Protonix 40 mg PO BID until follow-up OV  4. Stable for discharge from GI standpoint  5. Avoid NSAIDs   Please call with questions or concerns.    Octavia Bruckner, PA-C [facility] [phone] (Cell)

## 2020-06-19 NOTE — TOC Progression Note (Signed)
Transition of Care [facility]) - Progression Note    Patient Details  Name: Regina Hernandez MRN: [id number] Date of Birth: 11/09/1934  [facility]) CM/SW Contact  Andersen Iorio, Gardiner Rhyme, LCSW Phone Number: 06/19/2020, 2:54 PM  Clinical Narrative:   Met briefly with pt and daughter to confirm they do not want home health PT. Both agree and feel once home she can get stronger and just by moving can get better. Plan to use her rollator and has no equipment needs. Daughter's can assist pt if needed and feel no needs.         Expected Discharge Plan and Services                                                 Social Determinants of Health (SDOH) Interventions    Readmission Risk Interventions No flowsheet data found.

## 2020-06-19 NOTE — Discharge Summary (Signed)
Physician Discharge Summary  Regina Hernandez ZOX:[id number] DOB: 08-09-1934 DOA: 06/17/2020  PCP: Elby Beck, FNP  Admit date: 06/17/2020 Discharge date: 06/19/2020  Admitted From: Home Disposition: Home  Recommendations for Outpatient Follow-up:  1. Follow up with PCP in 1-2 weeks 2. Please obtain BMP/CBC in one week 3. Follow-up with gastroenterology as scheduled  Discharge Condition: Stable CODE STATUS: Full code Diet recommendation: Regular diet.  Avoid NSAIDs.  Discharge summary: 86 year old female with history of diverticular disease, mild dementia, hypertension, degenerative disease on NSAID's, sick sinus syndrome status post pacemaker and migraine presented to the emergency room with multiple episodes of dark bowel movements and episode of syncope while at bathroom. In the emergency room, she was tachycardic and hypotensive with blood pressure less than 80.  Hemoglobin 8.8, last known hemoglobin 12.8, 4 months ago.  Started on IV Protonix, given 1 units of PRBC and admitted to the hospital.  She does have history of Barrett's esophagus and multiple benign appearing ulcers in the stomach and duodenum, last endoscopy was done in 7/20.  Patient takes aspirin 81 mg daily, Mobic twice daily and recently started taking Aleve 1 or 2 tablets daily for neck pain and back pain.  Recently moved with her daughter.  # Symptomatic anemia with syncope, anemia of acute blood loss, bleeding gastric ulcer:  Status post fluid resuscitation, 1 unit of PRBC transfusion.  Stabilized.   Underwent upper GI endoscopy, found to have nonbleeding large gastric ulcer.  H. pylori pending.   Suspected due to NSAID's.  Patient is on aspirin, Mobic and also taking Aleve for her arthritis pain.   Stabilized today.    Plan:  Discontinue all NSAIDs.   Protonix 40 mg twice a day to continue until GI follow-up.   Recheck hemoglobin in about 1 week to ensure stabilization.   1 unit of IV iron today before  discharge. To resume all her other medications including Namenda, venlafaxine, risperidone, primidone and fluvoxamine. Going home with her daughter.  Declined to have home health PT.  Ambulated with no drop in blood pressures or any orthostatic symptoms.  Discharge Diagnoses:  Principal Problem:   Melena Active Problems:   Memory loss   Depression   Chronic headaches   Pacemaker   Sick sinus syndrome ([place])   Stage 3 chronic kidney disease   Essential tremor   Acute GI bleeding    Discharge Instructions  Discharge Instructions    Diet general   Complete by: As directed    Increase activity slowly   Complete by: As directed      Allergies as of 06/19/2020      Reactions   Adhesive [tape] Other (See Comments)   Contact dermatitis   Lyrica [pregabalin] Other (See Comments)   Unable to remember reaction.   Baclofen Rash   Sulfa Antibiotics Rash      Medication List    STOP taking these medications   meloxicam 7.5 MG tablet Commonly known as: MOBIC   sucralfate 1 g tablet Commonly known as: CARAFATE     TAKE these medications   acetaminophen 500 MG tablet Commonly known as: TYLENOL Take 2 tablets (1,000 mg total) by mouth every 8 (eight) hours as needed for mild pain or moderate pain. What changed:   when to take this  reasons to take this   Aspirin 81 81 MG EC tablet Generic drug: aspirin Take 1 tablet by mouth daily.   estradiol 0.1 MG/GM vaginal cream Commonly known as: ESTRACE Place 1 Applicatorful  vaginally as needed.   fluvoxaMINE 50 MG tablet Commonly known as: LUVOX Take 50 mg by mouth 2 (two) times daily.   gabapentin 400 MG capsule Commonly known as: NEURONTIN Take 1 capsule (400 mg total) by mouth 3 (three) times daily.   memantine 10 MG tablet Commonly known as: NAMENDA TAKE 1 TABLET BY MOUTH TWICE A DAY   nitrofurantoin 50 MG capsule Commonly known as: MACRODANTIN Take 50 mg by mouth at bedtime.   pantoprazole 40 MG  tablet Commonly known as: PROTONIX Take 1 tablet (40 mg total) by mouth 2 (two) times daily.   primidone 50 MG tablet Commonly known as: MYSOLINE Take 2 tablets (100 mg total) by mouth at bedtime.   risperiDONE 2 MG tablet Commonly known as: RISPERDAL Take 1 mg by mouth at bedtime.   STOOL SOFTENER PO Take 1 capsule by mouth in the morning and at bedtime.   venlafaxine 75 MG tablet Commonly known as: EFFEXOR Take 75 mg by mouth daily.   vitamin B-12 1000 MCG tablet Commonly known as: CYANOCOBALAMIN Take 1,000 mcg by mouth daily.   VITAMIN D3 PO Take 25 mcg by mouth daily.       Allergies  Allergen Reactions  . Adhesive [Tape] Other (See Comments)    Contact dermatitis  . Lyrica [Pregabalin] Other (See Comments)    Unable to remember reaction.  . Baclofen Rash  . Sulfa Antibiotics Rash    Consultations:  Gastroenterology   Procedures/Studies:  No results found. (Echo, Carotid, EGD, Colonoscopy, ERCP)    Subjective: Patient seen and examined.  Daughter at the bedside.  Patient is eager to go home.  She ambulated with physical therapy.  Denies any nausea vomiting.   Discharge Exam: Vitals:   06/19/20 1355 06/19/20 1405  BP:    Pulse: (!) 150 (!) 107  Resp:    Temp:    SpO2:     Vitals:   06/19/20 0821 06/19/20 1156 06/19/20 1355 06/19/20 1405  BP: (!) 146/95 (!) 139/98    Pulse: 93 96 (!) 150 (!) 107  Resp: (!) 22 18    Temp: 99.1 F (37.3 C) 98.7 F (37.1 C)    TempSrc: Oral Oral    SpO2: 98% 99%    Weight:      Height:        General: Pt is alert, awake, not in acute distress Cardiovascular: RRR, S1/S2 +, no rubs, no gallops, pacemaker in place. Respiratory: CTA bilaterally, no wheezing, no rhonchi Abdominal: Soft, NT, ND, bowel sounds + Extremities: no edema, no cyanosis    The results of significant diagnostics from this hospitalization (including imaging, microbiology, ancillary and laboratory) are listed below for reference.      Microbiology: Recent Results (from the past 240 hour(s))  SARS Coronavirus 2 by RT PCR (hospital order, performed in [facility] hospital lab) Nasopharyngeal Nasopharyngeal Swab     Status: None   Collection Time: 06/17/20  8:13 PM   Specimen: Nasopharyngeal Swab  Result Value Ref Range Status   SARS Coronavirus 2 NEGATIVE NEGATIVE Final    Comment: (NOTE) SARS-CoV-2 target nucleic acids are NOT DETECTED.  The SARS-CoV-2 RNA is generally detectable in upper and lower respiratory specimens during the acute phase of infection. The lowest concentration of SARS-CoV-2 viral copies this assay can detect is 250 copies / mL. A negative result does not preclude SARS-CoV-2 infection and should not be used as the sole basis for treatment or other patient management decisions.  A negative  result may occur with improper specimen collection / handling, submission of specimen other than nasopharyngeal swab, presence of viral mutation(s) within the areas targeted by this assay, and inadequate number of viral copies (<250 copies / mL). A negative result must be combined with clinical observations, patient history, and epidemiological information.  Fact Sheet for Patients:   StrictlyIdeas.no  Fact Sheet for Healthcare Providers: BankingDealers.co.za  This test is not yet approved or  cleared by the [place] FDA and has been authorized for detection and/or diagnosis of SARS-CoV-2 by FDA under an Emergency Use Authorization (EUA).  This EUA will remain in effect (meaning this test can be used) for the duration of the COVID-19 declaration under Section 564(b)(1) of the Act, 21 U.S.C. section 360bbb-3(b)(1), unless the authorization is terminated or revoked sooner.  Performed at [facility], [address] [place], [place] [address]      Labs: BNP (last 3 results) No results for input(s): BNP in the last 8760 hours. Basic  Metabolic Panel: Recent Labs  Lab 06/17/20 1918 06/18/20 0608 06/19/20 0139  NA 133* 139 138  K 3.7 3.9 3.7  CL 102 109 107  CO2 22 24 26   GLUCOSE 126* 94 89  BUN 56* 35* 17  CREATININE 0.93 0.84 0.70  CALCIUM 9.1 8.5* 8.5*  MG  --   --  1.9  PHOS  --   --  2.5   Liver Function Tests: Recent Labs  Lab 06/17/20 1918 06/18/20 0608  AST 19 13*  ALT 12 10  ALKPHOS 40 28*  BILITOT 0.6 1.2  PROT 6.3* 5.1*  ALBUMIN 3.7 3.1*   No results for input(s): LIPASE, AMYLASE in the last 168 hours. No results for input(s): AMMONIA in the last 168 hours. CBC: Recent Labs  Lab 06/17/20 1918 06/18/20 0142 06/18/20 0722 06/18/20 1829 06/19/20 0139  WBC 9.2 8.4 6.9 8.5 6.7  HGB 8.8* 8.8* 8.8* 8.8* 8.1*  HCT 26.5* 25.3* 25.9* 25.6* 24.5*  MCV 91.7 92.0 93.2 92.8 95.7  PLT 146* 113* 112* 103* 101*   Cardiac Enzymes: No results for input(s): CKTOTAL, CKMB, CKMBINDEX, TROPONINI in the last 168 hours. BNP: Invalid input(s): POCBNP CBG: Recent Labs  Lab 06/17/20 1856  GLUCAP 123*   D-Dimer No results for input(s): DDIMER in the last 72 hours. Hgb A1c No results for input(s): HGBA1C in the last 72 hours. Lipid Profile No results for input(s): CHOL, HDL, LDLCALC, TRIG, CHOLHDL, LDLDIRECT in the last 72 hours. Thyroid function studies No results for input(s): TSH, T4TOTAL, T3FREE, THYROIDAB in the last 72 hours.  Invalid input(s): FREET3 Anemia work up No results for input(s): VITAMINB12, FOLATE, FERRITIN, TIBC, IRON, RETICCTPCT in the last 72 hours. Urinalysis    Component Value Date/Time   COLORURINE STRAW (A) 06/18/2020 0412   APPEARANCEUR CLEAR (A) 06/18/2020 0412   APPEARANCEUR Hazy (A) 02/29/2020 0927   LABSPEC 1.013 06/18/2020 0412   PHURINE 5.0 06/18/2020 0412   GLUCOSEU NEGATIVE 06/18/2020 0412   HGBUR MODERATE (A) 06/18/2020 0412   BILIRUBINUR NEGATIVE 06/18/2020 0412   BILIRUBINUR Negative 02/29/2020 0927   KETONESUR NEGATIVE 06/18/2020 0412   PROTEINUR  NEGATIVE 06/18/2020 0412   NITRITE NEGATIVE 06/18/2020 0412   LEUKOCYTESUR TRACE (A) 06/18/2020 0412   Sepsis Labs Invalid input(s): PROCALCITONIN,  WBC,  LACTICIDVEN Microbiology Recent Results (from the past 240 hour(s))  SARS Coronavirus 2 by RT PCR (hospital order, performed in [place] hospital lab) Nasopharyngeal Nasopharyngeal Swab     Status: None   Collection  Time: 06/17/20  8:13 PM   Specimen: Nasopharyngeal Swab  Result Value Ref Range Status   SARS Coronavirus 2 NEGATIVE NEGATIVE Final    Comment: (NOTE) SARS-CoV-2 target nucleic acids are NOT DETECTED.  The SARS-CoV-2 RNA is generally detectable in upper and lower respiratory specimens during the acute phase of infection. The lowest concentration of SARS-CoV-2 viral copies this assay can detect is 250 copies / mL. A negative result does not preclude SARS-CoV-2 infection and should not be used as the sole basis for treatment or other patient management decisions.  A negative result may occur with improper specimen collection / handling, submission of specimen other than nasopharyngeal swab, presence of viral mutation(s) within the areas targeted by this assay, and inadequate number of viral copies (<250 copies / mL). A negative result must be combined with clinical observations, patient history, and epidemiological information.  Fact Sheet for Patients:   StrictlyIdeas.no  Fact Sheet for Healthcare Providers: BankingDealers.co.za  This test is not yet approved or  cleared by the [place] FDA and has been authorized for detection and/or diagnosis of SARS-CoV-2 by FDA under an Emergency Use Authorization (EUA).  This EUA will remain in effect (meaning this test can be used) for the duration of the COVID-19 declaration under Section 564(b)(1) of the Act, 21 U.S.C. section 360bbb-3(b)(1), unless the authorization is terminated or revoked sooner.  Performed at  [facility], [address] [place], [place] [address]      Time coordinating discharge:  35 minutes  SIGNED:   Barb Merino, MD  [facility] 06/19/2020, 3:38 PM

## 2020-06-19 NOTE — Evaluation (Signed)
Physical Therapy Evaluation Patient Details Name: Regina Hernandez MRN: [id number] DOB: 02/01/1934 Today's Date: 06/19/2020   History of Present Illness  Pt is an 86 y.o. female presenting to hospital 6/22 with syncopal episode; 5 episodes of bloody stool; pt noted to be tachycardic and hypotensive.  Pt admitted with GI bleed and s/p endoscopy 6/23.  PMH includes anxiety, migraines, pacemaker, chronic HA's, vertigo, L TKR, and SSS.  Clinical Impression  Prior to hospital admission, pt was independent with ambulation; lives with her 2 daughters and 1 granddaughter; has 24/7 assist; ramp to enter home.  Currently pt is modified independent with bed mobility; SBA with transfers; and CGA ambulating 75 feet and then 25 feet with RW (sitting rest break between ambulation trials d/t elevated HR with activity).  Overall pt steady with RW (pt preferring RW at this time d/t generalized weakness but would prefer to use rollator that she has at home instead because it rolls easier).  Pt's HR 107 bpm at rest; increased to 150 bpm after 75 feet of ambulation; decreased to 110 bpm with sitting rest break; then HR increased to 130-132 bpm after ambulating 25 feet (decreased to 107-110 bpm with rest in bed); nurse notified of pt's elevated HR with ambulation.  Pt reporting SOB with ambulation wearing mask: O2 sats WFL on room air.  Pt and pt's daughter report pt ambulated further earlier today (not sure what vitals were); pt's daughter reporting pt does not need to ambulate further than 75 feet at a time at home.  Discussed pacing and activity modification d/t elevated HR with activity: pt's daughter verbalizing appropriate understanding and daughter discussed using much closer bathroom for pt to use at home.  Pt would benefit from skilled PT to address noted impairments and functional limitations (see below for any additional details).  Upon hospital discharge, pt would benefit from [place]; pt's daughter preferring no HHPT at  this time and reports no concerns with being able to assist pt with progression off RW at home.    Follow Up Recommendations Home health PT (family preferring NO home health PT at this time)    Equipment Recommendations  Other (comment) (pt has rollator at home that pt prefers to use (rolls easier))    Recommendations for Other Services       Precautions / Restrictions Precautions Precautions: Fall Restrictions Weight Bearing Restrictions: No      Mobility  Bed Mobility Overal bed mobility: Modified Independent             General bed mobility comments: Semi-supine to/from sitting without any noted difficulties  Transfers Overall transfer level: Needs assistance   Transfers: Sit to/from Stand Sit to Stand: Supervision         General transfer comment: fairly strong stand up from bed and controlled descent sitting onto bed  Ambulation/Gait Ambulation/Gait assistance: Min guard;Supervision Gait Distance (Feet):  (75 feet; 25 feet) Assistive device: Rolling walker (2 wheeled)   Gait velocity: mildly increased   General Gait Details: step through gait pattern; steady with RW  Stairs Stairs:  (pt has ramp to enter home)          Wheelchair Mobility    Modified Rankin (Stroke Patients Only)       Balance Overall balance assessment: Needs assistance Sitting-balance support: No upper extremity supported;Feet supported Sitting balance-Leahy Scale: Normal Sitting balance - Comments: steady sitting reaching outside BOS   Standing balance support: No upper extremity supported Standing balance-Leahy Scale: Good Standing balance comment: steady standing  reaching within BOS                             Pertinent Vitals/Pain Pain Assessment: No/denies pain    Home Living Family/patient expects to be discharged to:: Private residence Living Arrangements: Children (daughter, daughter, and granddaughter) Available Help at Discharge:  Family;Available 24 hours/day Type of Home: House Home Access: Ramped entrance     Home Layout: One level Home Equipment: Walker - 4 wheels;Cane - single point;Wheelchair - manual      Prior Function Level of Independence: Independent         Comments: Pt reports no falls in past 6 months.     Hand Dominance        Extremity/Trunk Assessment   Upper Extremity Assessment Upper Extremity Assessment: Generalized weakness    Lower Extremity Assessment Lower Extremity Assessment: Generalized weakness    Cervical / Trunk Assessment Cervical / Trunk Assessment: Normal  Communication      Cognition Arousal/Alertness: Awake/alert Behavior During Therapy: WFL for tasks assessed/performed Overall Cognitive Status: Within Functional Limits for tasks assessed                                        General Comments   Nursing cleared pt for participation in physical therapy.  Pt agreeable to PT session.  Pt's daughter present during session.    Exercises     Assessment/Plan    PT Assessment Patient needs continued PT services  PT Problem List Decreased strength;Decreased activity tolerance;Decreased balance;Decreased mobility;Decreased knowledge of use of DME;Cardiopulmonary status limiting activity       PT Treatment Interventions DME instruction;Gait training;Functional mobility training;Therapeutic activities;Therapeutic exercise;Balance training;Patient/family education    PT Goals (Current goals can be found in the Care Plan section)  Acute Rehab PT Goals Patient Stated Goal: to go home PT Goal Formulation: With patient Time For Goal Achievement: 07/03/20 Potential to Achieve Goals: Good    Frequency Min 2X/week   Barriers to discharge        Co-evaluation               AM-PAC PT "6 Clicks" Mobility  Outcome Measure Help needed turning from your back to your side while in a flat bed without using bedrails?: None Help needed moving  from lying on your back to sitting on the side of a flat bed without using bedrails?: None Help needed moving to and from a bed to a chair (including a wheelchair)?: A Little Help needed standing up from a chair using your arms (e.g., wheelchair or bedside chair)?: A Little Help needed to walk in hospital room?: A Little Help needed climbing 3-5 steps with a railing? : A Little 6 Click Score: 20    End of Session Equipment Utilized During Treatment: Gait belt Activity Tolerance: Other (comment) (Limited d/t elevated HR with activity) Patient left: in bed;with call bell/phone within reach;with family/visitor present Nurse Communication: Mobility status;Precautions;Other (comment) (pt's HR with ambulation) PT Visit Diagnosis: Other abnormalities of gait and mobility (R26.89);Muscle weakness (generalized) (M62.81)    Time: 6160-7371 PT Time Calculation (min) (ACUTE ONLY): 32 min   Charges:   PT Evaluation $PT Eval Low Complexity: 1 Low PT Treatments $Therapeutic Exercise: 8-22 mins       Leitha Bleak, PT 06/19/20, 2:35 PM

## 2020-06-19 NOTE — Discharge Instructions (Signed)
Near-Syncope Near-syncope is when you suddenly get weak or dizzy, or you feel like you might pass out (faint). This may also be called presyncope. This is due to a lack of blood flow to the brain. During an episode of near-syncope, you may:  Feel dizzy, weak, or light-headed.  Feel sick to your stomach (nauseous).  See all white or all black.  See spots.  Have cold, clammy skin. This condition is caused by a sudden decrease in blood flow to the brain. This decrease can result from various causes, but most of those causes are not dangerous. However, near-syncope may be a sign of a serious medical problem, so it is important to seek medical care. Follow these instructions at home: Medicines  Take over-the-counter and prescription medicines only as told by your doctor.  If you are taking blood pressure or heart medicine, get up slowly and spend many minutes getting ready to sit and then stand. This can help with dizziness. General instructions  Be aware of any changes in your symptoms.  Talk with your doctor about your symptoms. You may need to have testing to find the cause of your near-syncope.  If you start to feel like you might pass out, lie down right away. Raise (elevate) your feet above the level of your heart. Breathe deeply and steadily. Wait until all of the symptoms are gone.  Have someone stay with you until you feel stable.  Do not drive, use machinery, or play sports until your doctor says it is okay.  Drink enough fluid to keep your pee (urine) pale yellow.  Keep all follow-up visits as told by your doctor. This is important. Get help right away if you:  Have a seizure.  Have pain in your: ? Chest. ? Belly (abdomen). ? Back.  Faint once or more than once.  Have a very bad headache.  Are bleeding from your mouth or butt.  Have black or tarry poop (stool).  Have a very fast or uneven heartbeat (palpitations).  Are mixed up (confused).  Have trouble  walking.  Are very weak.  Have trouble seeing. These symptoms may be an emergency. Do not wait to see if the symptoms will go away. Get medical help right away. Call your local emergency services (911 in the [place] Do not drive yourself to the hospital. Summary  Near-syncope is when you suddenly get weak or dizzy, or you feel like you might pass out (faint).  This condition is caused by a lack of blood flow to the brain.  Near-syncope may be a sign of a serious medical problem, so it is important to seek medical care. This information is not intended to replace advice given to you by your health care provider. Make sure you discuss any questions you have with your health care provider. Document Revised: 04/06/2019 Document Reviewed: 11/01/2018 Elsevier Patient Education  2020 [employer] Inc[employer]  

## 2020-06-19 NOTE — Progress Notes (Signed)
Patient received and understands discharge instructions 

## 2020-06-20 ENCOUNTER — Ambulatory Visit: Payer: Medicare Other | Admitting: Urology

## 2020-06-20 ENCOUNTER — Telehealth: Payer: Self-pay

## 2020-06-20 NOTE — Telephone Encounter (Signed)
Transition Care Management Follow-up Telephone Call  Date of discharge and from where: 06/19/2020, [facility]  How have you been since you were released from the hospital? Daughter [name]) (on [place]) states that patient is doing better. No complaints at this time.   Any questions or concerns? No   Items Reviewed:  Did the pt receive and understand the discharge instructions provided? Yes   Medications obtained and verified? Yes   Any new allergies since your discharge? No   Dietary orders reviewed? Yes  Do you have support at home? Yes   Functional Questionnaire: (I = Independent and D = Dependent) ADLs: I  Bathing/Dressing- I  Meal Prep- I  Eating- I  Maintaining continence- I  Transferring/Ambulation- I  Managing Meds- I  Follow up appointments reviewed:   PCP Hospital f/u appt confirmed? Yes  Scheduled to see Clarene Reamer, NP on 08/11/2020 @ 11:15 am. Daughter wanted to keep this appointment.   [facility] f/u appt confirmed? Yes  Scheduled to see GI   Are transportation arrangements needed? No   If their condition worsens, is the pt aware to call PCP or go to the Emergency Dept.? Yes  Was the patient provided with contact information for the PCP's office or ED? Yes  Was to pt encouraged to call back with questions or concerns? Yes

## 2020-06-20 NOTE — Anesthesia Postprocedure Evaluation (Signed)
Anesthesia Post Note  Patient: Regina Hernandez  Procedure(s) Performed: ESOPHAGOGASTRODUODENOSCOPY (EGD) (N/A )  Patient location during evaluation: Endoscopy Anesthesia Type: General Level of consciousness: awake and alert Pain management: pain level controlled Vital Signs Assessment: post-procedure vital signs reviewed and stable Respiratory status: spontaneous breathing, nonlabored ventilation, respiratory function stable and patient connected to nasal cannula oxygen Cardiovascular status: blood pressure returned to baseline and stable Postop Assessment: no apparent nausea or vomiting Anesthetic complications: no   No complications documented.   Last Vitals:  Vitals:   06/19/20 1355 06/19/20 1405  BP:    Pulse: (!) 150 (!) 107  Resp:    Temp:    SpO2:      Last Pain:  Vitals:   06/19/20 1156  TempSrc: Oral  PainSc:                  Arita Miss

## 2020-06-20 NOTE — Telephone Encounter (Signed)
Please call patient's daughter and advise her that her mom needs a sooner follow up with me than August. Per her hospital discharge instructions, she needs blood work in a week. Can she bring her in next week?

## 2020-06-21 ENCOUNTER — Observation Stay
Admission: EM | Admit: 2020-06-21 | Discharge: 2020-06-23 | Disposition: A | Discharge status: Home / Self Care | Payer: Medicare Other | Attending: Internal Medicine | Admitting: Internal Medicine

## 2020-06-21 ENCOUNTER — Encounter: Payer: Self-pay | Admitting: Intensive Care

## 2020-06-21 ENCOUNTER — Other Ambulatory Visit: Payer: Self-pay

## 2020-06-21 DIAGNOSIS — N3000 Acute cystitis without hematuria: Secondary | ICD-10-CM | POA: Diagnosis present

## 2020-06-21 DIAGNOSIS — I472 Ventricular tachycardia, unspecified: Secondary | ICD-10-CM

## 2020-06-21 DIAGNOSIS — Z66 Do not resuscitate: Secondary | ICD-10-CM | POA: Insufficient documentation

## 2020-06-21 DIAGNOSIS — M1990 Unspecified osteoarthritis, unspecified site: Secondary | ICD-10-CM | POA: Insufficient documentation

## 2020-06-21 DIAGNOSIS — D500 Iron deficiency anemia secondary to blood loss (chronic): Secondary | ICD-10-CM | POA: Diagnosis not present

## 2020-06-21 DIAGNOSIS — R55 Syncope and collapse: Principal | ICD-10-CM | POA: Diagnosis present

## 2020-06-21 DIAGNOSIS — F329 Major depressive disorder, single episode, unspecified: Secondary | ICD-10-CM | POA: Diagnosis not present

## 2020-06-21 DIAGNOSIS — Z8711 Personal history of peptic ulcer disease: Secondary | ICD-10-CM | POA: Insufficient documentation

## 2020-06-21 DIAGNOSIS — Z950 Presence of cardiac pacemaker: Secondary | ICD-10-CM | POA: Insufficient documentation

## 2020-06-21 DIAGNOSIS — G250 Essential tremor: Secondary | ICD-10-CM | POA: Insufficient documentation

## 2020-06-21 DIAGNOSIS — F0390 Unspecified dementia without behavioral disturbance: Secondary | ICD-10-CM | POA: Diagnosis not present

## 2020-06-21 DIAGNOSIS — I495 Sick sinus syndrome: Secondary | ICD-10-CM | POA: Diagnosis not present

## 2020-06-21 DIAGNOSIS — Z20822 Contact with and (suspected) exposure to covid-19: Secondary | ICD-10-CM | POA: Diagnosis not present

## 2020-06-21 DIAGNOSIS — G43909 Migraine, unspecified, not intractable, without status migrainosus: Secondary | ICD-10-CM | POA: Diagnosis not present

## 2020-06-21 DIAGNOSIS — I951 Orthostatic hypotension: Secondary | ICD-10-CM

## 2020-06-21 DIAGNOSIS — Z7982 Long term (current) use of aspirin: Secondary | ICD-10-CM | POA: Diagnosis not present

## 2020-06-21 DIAGNOSIS — Z79899 Other long term (current) drug therapy: Secondary | ICD-10-CM | POA: Insufficient documentation

## 2020-06-21 DIAGNOSIS — I10 Essential (primary) hypertension: Secondary | ICD-10-CM | POA: Diagnosis not present

## 2020-06-21 DIAGNOSIS — F419 Anxiety disorder, unspecified: Secondary | ICD-10-CM | POA: Insufficient documentation

## 2020-06-21 DIAGNOSIS — R079 Chest pain, unspecified: Secondary | ICD-10-CM

## 2020-06-21 HISTORY — DX: Gastrointestinal hemorrhage, unspecified: K92.2

## 2020-06-21 LAB — URINALYSIS, COMPLETE (UACMP) WITH MICROSCOPIC
Bilirubin Urine: NEGATIVE
Glucose, UA: NEGATIVE mg/dL
Ketones, ur: NEGATIVE mg/dL
Nitrite: NEGATIVE
Protein, ur: NEGATIVE mg/dL
Specific Gravity, Urine: 1.013 (ref 1.005–1.030)
WBC, UA: >50 WBC/hpf — ABNORMAL HIGH (ref 0–5)
pH: 5 (ref 5.0–8.0)

## 2020-06-21 LAB — BASIC METABOLIC PANEL
Anion gap: 6 (ref 5–15)
BUN: 19 mg/dL (ref 8–23)
CO2: 30 mmol/L (ref 22–32)
Calcium: 9.4 mg/dL (ref 8.9–10.3)
Chloride: 99 mmol/L (ref 98–111)
Creatinine, Ser: 0.92 mg/dL (ref 0.44–1.00)
GFR calc Af Amer: >60 mL/min (ref >60)
GFR calc non Af Amer: 57 mL/min — ABNORMAL LOW (ref >60)
Glucose, Bld: 110 mg/dL — ABNORMAL HIGH (ref 70–99)
Potassium: 4.2 mmol/L (ref 3.5–5.1)
Sodium: 135 mmol/L (ref 135–145)

## 2020-06-21 LAB — CBC
HCT: 25.9 % — ABNORMAL LOW (ref 36.0–46.0)
Hemoglobin: 8.6 g/dL — ABNORMAL LOW (ref 12.0–15.0)
MCH: 32.1 pg (ref 26.0–34.0)
MCHC: 33.2 g/dL (ref 30.0–36.0)
MCV: 96.6 fL (ref 80.0–100.0)
Platelets: 171 10*3/uL (ref 150–400)
RBC: 2.68 MIL/uL — ABNORMAL LOW (ref 3.87–5.11)
RDW: 13.9 % (ref 11.5–15.5)
WBC: 9.8 10*3/uL (ref 4.0–10.5)
nRBC: 0 % (ref 0.0–0.2)

## 2020-06-21 LAB — MAGNESIUM: Magnesium: 1.9 mg/dL (ref 1.7–2.4)

## 2020-06-21 MED ORDER — SODIUM CHLORIDE 0.9 % IV SOLN
1.0000 g | Freq: Once | INTRAVENOUS | Status: AC
  Administered 2020-06-21: 1 g via INTRAVENOUS
  Filled 2020-06-21: qty 10

## 2020-06-21 MED ORDER — METOPROLOL TARTRATE 25 MG PO TABS
25.0000 mg | ORAL_TABLET | Freq: Two times a day (BID) | ORAL | Status: DC
  Administered 2020-06-21 – 2020-06-22 (×2): 25 mg via ORAL
  Filled 2020-06-21 (×2): qty 1

## 2020-06-21 MED ORDER — LACTATED RINGERS IV BOLUS
1000.0000 mL | Freq: Once | INTRAVENOUS | Status: AC
  Administered 2020-06-21: 1000 mL via INTRAVENOUS

## 2020-06-21 NOTE — ED Provider Notes (Addendum)
[facility] Emergency Department Provider Note   ____________________________________________   First MD Initiated Contact with Patient 06/21/20 2200     (approximate)  I have reviewed the triage vital signs and the nursing notes.   HISTORY  Chief Complaint Near Syncope    HPI Regina Capasso is a 86 y.o. female with past medical history of mild dementia, hypertension, sick sinus syndrome status post pacemaker, and arthritis who presents to the ED complaining of near syncope.  Patient reports that ever since she left the hospital for a bleeding ulcer about 1 week ago she has felt like she is going to pass out when she goes to stand up.  She feels very lightheaded with these episodes, but does not actually pass out.  She has not had any associated chest pain or shortness of breath.  She denies any fevers, cough, dysuria, or hematuria.  She had endoscopy during her recent admission showing large nonbleeding ulcer attributed to her NSAID use, but she has not noticed any more dark stools since her discharge.        Past Medical History:  Diagnosis Date  . Anxiety   . Cervical dystonia   . Essential tremor   . GI bleed   . Memory loss   . Migraine   . Occipital neuralgia     Patient Active Problem List   Diagnosis Date Noted  . Acute GI bleeding 06/18/2020  . Melena 06/17/2020  . Essential tremor   . Arachnoid cyst 02/11/2020  . Chronic headaches 02/11/2020  . Coccydynia 02/11/2020  . H/O staphylococcal infection 02/11/2020  . Migraine 02/11/2020  . Vertigo 02/11/2020  . Memory loss 02/04/2020  . Gait abnormality 02/04/2020  . Depression 02/04/2020  . Confusion 02/04/2020  . Major depression with psychotic features ([place]) 10/31/2019  . History of suicide attempt 10/30/2019  . Iron deficiency anemia 05/22/2019  . Status post left knee replacement 11/20/2018  . Pain of cervical facet joint 07/13/2018  . Arthritis 06/01/2018  . Tremor 06/01/2018    . Spondylosis without myelopathy or radiculopathy, cervical region 01/19/2017  . Diverticulosis of large intestine without hemorrhage 11/30/2016  . Stage 3 chronic kidney disease 11/30/2016  . Plantar fasciitis of left foot 09/29/2016  . Left knee pain 11/26/2014  . Polyp of colon 11/01/2013  . Hypertension 03/01/2012  . Pacemaker 01/26/2012  . History of endocarditis 09/15/2011  . Coronary atherosclerosis 08/17/2011  . Sick sinus syndrome ([place]) 08/17/2011  . Barrett's esophagus without dysplasia 07/20/2010  . Microscopic hematuria 07/20/2010  . Recurrent UTI 07/20/2010  . Vestibulitis, vulvar 07/20/2010    Past Surgical History:  Procedure Laterality Date  . BOWEL RESECTION    . CARDIAC CATHETERIZATION  2016   no stents/[facility]  . ESOPHAGOGASTRODUODENOSCOPY N/A 06/18/2020   Procedure: ESOPHAGOGASTRODUODENOSCOPY (EGD);  Surgeon: Tol[place], Benay Pike, MD;  Location: [facility] ENDOSCOPY;  Service: Gastroenterology;  Laterality: N/A;  . PACEMAKER IMPLANT    . REPLACEMENT TOTAL KNEE Left     Prior to Admission medications   Medication Sig Start Date End Date Taking? Authorizing Provider  acetaminophen (TYLENOL) 500 MG tablet Take 2 tablets (1,000 mg total) by mouth every 8 (eight) hours as needed for mild pain or moderate pain. 06/19/20   Barb Merino, MD  aspirin (ASPIRIN 81) 81 MG EC tablet Take 1 tablet by mouth daily.     [provider]  Cholecalciferol (VITAMIN D3 PO) Take 25 mcg by mouth daily.    [provider]  Docusate Calcium (STOOL SOFTENER PO) Take 1 capsule by mouth in the morning and at bedtime.     [provider]  estradiol (ESTRACE) 0.1 MG/GM vaginal cream Place 1 Applicatorful vaginally as needed.    [provider]  fluvoxaMINE (LUVOX) 50 MG tablet Take 50 mg by mouth 2 (two) times daily.    [provider]  gabapentin (NEURONTIN) 400 MG capsule Take 1 capsule (400 mg total) by mouth 3 (three) times daily.  04/22/20   Suzzanne Cloud, NP  memantine (NAMENDA) 10 MG tablet TAKE 1 TABLET BY MOUTH TWICE A DAY 02/26/20   Marcial Pacas, MD  nitrofurantoin (MACRODANTIN) 50 MG capsule Take 50 mg by mouth at bedtime.    [provider]  pantoprazole (PROTONIX) 40 MG tablet Take 1 tablet (40 mg total) by mouth 2 (two) times daily. 06/19/20 07/19/20  Barb Merino, MD  primidone (MYSOLINE) 50 MG tablet Take 2 tablets (100 mg total) by mouth at bedtime. 04/22/20   Suzzanne Cloud, NP  risperiDONE (RISPERDAL) 2 MG tablet Take 1 mg by mouth at bedtime.     [provider]  venlafaxine (EFFEXOR) 75 MG tablet Take 75 mg by mouth daily.    [provider]  vitamin B-12 (CYANOCOBALAMIN) 1000 MCG tablet Take 1,000 mcg by mouth daily.    [provider]    Allergies Adhesive [tape], Lyrica [pregabalin], Baclofen, and Sulfa antibiotics  Family History  Problem Relation Age of Onset  . Other Mother        "old age"  . Ulcers Father   . Stomach cancer Brother     Social History Social History   Tobacco Use  . Smoking status: Never Smoker  . Smokeless tobacco: Never Used  Vaping Use  . Vaping Use: Never used  Substance Use Topics  . Alcohol use: Not Currently  . Drug use: Never    Review of Systems  Constitutional: No fever/chills Eyes: No visual changes. ENT: No sore throat. Cardiovascular: Denies chest pain.  Positive for near syncope. Respiratory: Denies shortness of breath. Gastrointestinal: No abdominal pain.  No nausea, no vomiting.  No diarrhea.  No constipation. Genitourinary: Negative for dysuria. Musculoskeletal: Negative for back pain. Skin: Negative for rash. Neurological: Negative for headaches, focal weakness or numbness.  ____________________________________________   PHYSICAL EXAM:  VITAL SIGNS: ED Triage Vitals  Enc Vitals Group     BP 06/21/20 1605 (!) 91/55     Pulse Rate 06/21/20 1605 (!) 101     Resp 06/21/20 1605 16     Temp 06/21/20 1605  98.2 F (36.8 C)     Temp Source 06/21/20 1605 Oral     SpO2 06/21/20 1605 96 %     Weight 06/21/20 1606 132 lb (59.9 kg)     Height 06/21/20 1606 5\' 3"  (1.6 m)     Head Circumference --      Peak Flow --      Pain Score 06/21/20 1606 0     Pain Loc --      Pain Edu? --      Excl. in [place]? --     Constitutional: Alert and oriented. Eyes: Conjunctivae are normal. Head: Atraumatic. Nose: No congestion/rhinnorhea. Mouth/Throat: Mucous membranes are moist. Neck: Normal ROM Cardiovascular: Normal rate, regular rhythm. Grossly normal heart sounds. Respiratory: Normal respiratory effort.  No retractions. Lungs CTAB. Gastrointestinal: Soft and nontender. No distention. Genitourinary: deferred Musculoskeletal: No lower extremity tenderness nor edema. Neurologic:  Normal speech and language.  No gross focal neurologic deficits are appreciated. Skin:  Skin is warm, dry and intact. No rash noted. Psychiatric: Mood and affect are normal. Speech and behavior are normal.  ____________________________________________   LABS (all labs ordered are listed, but only abnormal results are displayed)  Labs Reviewed  BASIC METABOLIC PANEL - Abnormal; Notable for the following components:      Result Value   Glucose, Bld 110 (*)    GFR calc non Af Amer 57 (*)    All other components within normal limits  CBC - Abnormal; Notable for the following components:   RBC 2.68 (*)    Hemoglobin 8.6 (*)    HCT 25.9 (*)    All other components within normal limits  URINALYSIS, COMPLETE (UACMP) WITH MICROSCOPIC - Abnormal; Notable for the following components:   Color, Urine YELLOW (*)    APPearance CLOUDY (*)    Hgb urine dipstick SMALL (*)    Leukocytes,Ua LARGE (*)    WBC, UA >50 (*)    Bacteria, UA MANY (*)    All other components within normal limits  URINE CULTURE  SARS CORONAVIRUS 2 BY RT PCR ([place] [facility])  MAGNESIUM    ____________________________________________  EKG  ED ECG REPORT I, Blake Divine, the attending physician, personally viewed and interpreted this ECG.   Date: 06/21/2020  EKG Time: 16:04  Rate: 91  Rhythm: normal sinus rhythm  Axis: LAD  Intervals:none  ST&T Change: None   PROCEDURES  Procedure(s) performed (including Critical Care):  .Critical Care Performed by: Blake Divine, MD Authorized by: Blake Divine, MD   Critical care provider statement:    Critical care time (minutes):  45   Critical care time was exclusive of:  Separately billable procedures and treating other patients and teaching time   Critical care was necessary to treat or prevent imminent or life-threatening deterioration of the following conditions:  Cardiac failure   Critical care was time spent personally by me on the following activities:  Discussions with consultants, evaluation of patient's response to treatment, examination of patient, ordering and performing treatments and interventions, ordering and review of laboratory studies, ordering and review of radiographic studies, pulse oximetry, re-evaluation of patient's condition, obtaining history from patient or surrogate and review of old charts   I assumed direction of critical care for this patient from another provider in my specialty: no       ____________________________________________   INITIAL IMPRESSION / ASSESSMENT AND PLAN / ED COURSE       86 year old female with past [place] of mild dementia, hypertension, sick sinus syndrome status post pacemaker, arthritis, and recent admission for bleeding peptic ulcer presents to the ED complaining of lightheadedness and near syncope whenever she goes to stand up.  She has not had any chest pain or shortness of breath with these episodes and EKG is unremarkable.  We will interrogate her pacemaker, however I have a low suspicion for cardiac etiology.  There is not seem to be any evidence of  acute bleeding as patient denies ongoing melena and her H&H is improved from discharge.  We will check orthostatic vital signs and hydrate with IV fluids.  Patient does have what appears to be a UTI and we will treat with Rocephin.  While in the process of interrogating patient's pacemaker, she was noted to have an approximately 12 beat run of ventricular tachycardia.  She again felt lightheaded and like she might pass out, daughter at bedside now stating  she had 4 similar episodes today, including 2 of which where she fully passed out.  Pacemaker interrogation reveals 110 episodes of high ventricular rate since April.  Case discussed with cardiology, who recommends starting patient on 25 mg metoprolol twice daily and agrees with plan for admission.  Electrolytes are unremarkable, case was discussed with hospitalist for admission.      ____________________________________________   FINAL CLINICAL IMPRESSION(S) / ED DIAGNOSES  Final diagnoses:  Syncope, unspecified syncope type  Ventricular tachycardia ([place])  Acute cystitis without hematuria     ED Discharge Orders    None       Note:  This document was prepared using Dragon voice recognition software and may include unintentional dictation errors.   Blake Divine, MD 06/21/20 0370    Blake Divine, MD 08/11/20 [id number]

## 2020-06-21 NOTE — ED Notes (Signed)
While interrogating pt pacemaker, pt went into a ventricular rhythm as indicated on the monitor. Printed off report and Dr. Charna Archer, MD made aware

## 2020-06-21 NOTE — ED Notes (Signed)
Pt outside with daughter eating

## 2020-06-21 NOTE — ED Notes (Signed)
Pt presents to the ED for near syncopal episodes. Pt was recently hospitalized for low hgb. Per daughter, when pt would stand up, pt would stare off into space or become unsteady and fall backwards. Daughters states she had 4 episodes like this today and 1 episode like this yesterday. Pt is A&Ox4 and NAD at this time.

## 2020-06-21 NOTE — ED Triage Notes (Signed)
First nurse note- syncope X 3 today per daughter. Recently admitted for GI bleed and low hgb

## 2020-06-21 NOTE — H&P (Signed)
History and Physical    Regina Hernandez ZOX:[id number] DOB: 14-Apr-1934 DOA: 06/21/2020  PCP: Elby Beck, FNP  Patient coming from: Home  I have personally briefly reviewed patient's old medical records in [place]  Chief Complaint: Syncopal episodes  HPI: Regina Hernandez is a 86 y.o. female with medical history significant for SSS s/p PPM, mild dementia/memory loss, hypertension, osteoarthritis, essential tremor, depression, and recent admission for acute upper GI bleeding in setting of gastric ulcers and NSAID use who presents to the ED for evaluation of multiple near syncopal and syncopal episodes.  Patient recently admitted from 06/18/2020-06/19/2020 for syncope in setting of acute blood loss anemia due to upper GI bleeding from gastric ulcers associated with NSAID use.  She required 1 unit PRBC transfusion.  NSAIDs were discontinued and she was discharged on oral PPI therapy.  Patient states that since discharge she has not seen any further obvious bleeding including BRBPR or melena.  She has however had frequent episodes of lightheadedness/dizziness which have been occurring whenever she stands up or is walking for a while.  She describes the dizziness as room spinning sensation.  She has had associated palpitations and midsternal sharp/stabbing chest discomfort.  During these episodes she does feel short of breath.  She says she had 5 episodes today and did have loss of consciousness with 2 of those episodes.  She did have a fall but was able to brace herself and avoid significant injury.  She is having some chest wall pain beneath her left breast.  ED Course:  Initial vitals showed BP 91/55, pulse 101, RR 16, temp 98.2 Fahrenheit, SPO2 96% on room air.  Orthostatic vital signs were positive.  Labs are notable for hemoglobin 8.6 (8.1 on 06/19/2020), WBC 9.8, platelets 171,000, sodium 135, potassium 4.2, bicarb 30, BUN 19, creatinine 0.92, serum glucose 110, magnesium  1.9.  Urinalysis shows negative nitrates, large leukocytes, 0-5 RBC/hpf, >50 WBC/hpf, many bacteria microscopy.  Urine culture was obtained and pending.  SARS-CoV-2 PCR is ordered and pending.  Patient was given 1 L LR and 1 g IV ceftriaxone.  Pacemaker was interrogated and during interrogation was noted to have a 12 beat run of ventricular tachycardia.  Pacemaker interrogation revealed 110 episodes of high ventricular rate since April.  EDP discussed the case with on-call cardiology who recommended starting metoprolol 25 mg twice daily, medical admission, and will see in the morning.  The hospitalist service was consulted to admit for further evaluation and management.   Review of Systems: All systems reviewed and are negative except as documented in history of present illness above.   Past Medical History:  Diagnosis Date  . Anxiety   . Cervical dystonia   . Essential tremor   . GI bleed   . Memory loss   . Migraine   . Occipital neuralgia     Past Surgical History:  Procedure Laterality Date  . BOWEL RESECTION    . CARDIAC CATHETERIZATION  2016   no stents/[facility]  . ESOPHAGOGASTRODUODENOSCOPY N/A 06/18/2020   Procedure: ESOPHAGOGASTRODUODENOSCOPY (EGD);  Surgeon: Toledo, Benay Pike, MD;  Location: [facility] ENDOSCOPY;  Service: Gastroenterology;  Laterality: N/A;  . PACEMAKER IMPLANT    . REPLACEMENT TOTAL KNEE Left     Social History:  reports that she has never smoked. She has never used smokeless tobacco. She reports previous alcohol use. She reports that she does not use drugs.  Allergies  Allergen Reactions  . Adhesive [Tape] Other (See Comments)  Contact dermatitis  . Lyrica [Pregabalin] Other (See Comments)    Unable to remember reaction.  . Baclofen Rash  . Sulfa Antibiotics Rash    Family History  Problem Relation Age of Onset  . Other Mother        "old age"  . Ulcers Father   . Stomach cancer Brother      Prior to Admission  medications   Medication Sig Start Date End Date Taking? Authorizing Provider  acetaminophen (TYLENOL) 500 MG tablet Take 2 tablets (1,000 mg total) by mouth every 8 (eight) hours as needed for mild pain or moderate pain. 06/19/20   Barb Merino, MD  aspirin (ASPIRIN 81) 81 MG EC tablet Take 1 tablet by mouth daily.     [provider]  Cholecalciferol (VITAMIN D3 PO) Take 25 mcg by mouth daily.    [provider]  Docusate Calcium (STOOL SOFTENER PO) Take 1 capsule by mouth in the morning and at bedtime.     [provider]  estradiol (ESTRACE) 0.1 MG/GM vaginal cream Place 1 Applicatorful vaginally as needed.    [provider]  fluvoxaMINE (LUVOX) 50 MG tablet Take 50 mg by mouth 2 (two) times daily.    [provider]  gabapentin (NEURONTIN) 400 MG capsule Take 1 capsule (400 mg total) by mouth 3 (three) times daily. 04/22/20   Suzzanne Cloud, NP  memantine (NAMENDA) 10 MG tablet TAKE 1 TABLET BY MOUTH TWICE A DAY 02/26/20   Marcial Pacas, MD  nitrofurantoin (MACRODANTIN) 50 MG capsule Take 50 mg by mouth at bedtime.    [provider]  pantoprazole (PROTONIX) 40 MG tablet Take 1 tablet (40 mg total) by mouth 2 (two) times daily. 06/19/20 07/19/20  Barb Merino, MD  primidone (MYSOLINE) 50 MG tablet Take 2 tablets (100 mg total) by mouth at bedtime. 04/22/20   Suzzanne Cloud, NP  risperiDONE (RISPERDAL) 2 MG tablet Take 1 mg by mouth at bedtime.     [provider]  venlafaxine (EFFEXOR) 75 MG tablet Take 75 mg by mouth daily.    [provider]  vitamin B-12 (CYANOCOBALAMIN) 1000 MCG tablet Take 1,000 mcg by mouth daily.    [provider]    Physical Exam: Vitals:   06/21/20 1718 06/21/20 2127 06/21/20 2230 06/21/20 2333  BP: (!) 101/58 111/71 123/73 (!) 144/70  Pulse: 76 92 68 72  Resp: 14 16 (!) 23 17  Temp:      TempSrc:      SpO2: 100% 98% 100% 100%  Weight:      Height:       Constitutional: Elderly  woman resting supine in bed, NAD, calm, comfortable Eyes: PERRL, lids and conjunctivae normal ENMT: Mucous membranes are moist. Posterior pharynx clear of any exudate or lesions.Normal dentition.  Neck: normal, supple, no masses. Respiratory: Bibasilar inspiratory crackles. Normal respiratory effort. No accessory muscle use.  Cardiovascular: Regular rate and rhythm, soft systolic murmur.  Slight nonpitting bilateral lower extremity edema. 2+ pedal pulses.  PPM in place left chest wall. Abdomen: no tenderness, no masses palpated. No hepatosplenomegaly. Bowel sounds positive.  Musculoskeletal: no clubbing / cyanosis. No joint deformity upper and lower extremities. Good ROM, no contractures. Normal muscle tone.  Tender to palpation anterior chest wall ribs beneath the left breast. Skin: Bruising, extremities noted, no rashes, lesions, ulcers. No induration Neurologic: CN 2-12 grossly intact. Sensation intact, Strength 5/5 in all 4.  Psychiatric: Normal judgment and insight. Alert and oriented x  3. Normal mood.   Labs on Admission: I have personally reviewed following labs and imaging studies  CBC: Recent Labs  Lab 06/18/20 0142 06/18/20 0722 06/18/20 1829 06/19/20 0139 06/21/20 1612  WBC 8.4 6.9 8.5 6.7 9.8  HGB 8.8* 8.8* 8.8* 8.1* 8.6*  HCT 25.3* 25.9* 25.6* 24.5* 25.9*  MCV 92.0 93.2 92.8 95.7 96.6  PLT 113* 112* 103* 101* 301   Basic Metabolic Panel: Recent Labs  Lab 06/17/20 1918 06/18/20 0608 06/19/20 0139 06/21/20 1612  NA 133* 139 138 135  K 3.7 3.9 3.7 4.2  CL 102 109 107 99  CO2 22 24 26 30   GLUCOSE 126* 94 89 110*  BUN 56* 35* 17 19  CREATININE 0.93 0.84 0.70 0.92  CALCIUM 9.1 8.5* 8.5* 9.4  MG  --   --  1.9 1.9  PHOS  --   --  2.5  --    GFR: Estimated Creatinine Clearance: 37.7 mL/min (by C-G formula based on SCr of 0.92 mg/dL). Liver Function Tests: Recent Labs  Lab 06/17/20 1918 06/18/20 0608  AST 19 13*  ALT 12 10  ALKPHOS 40 28*  BILITOT 0.6 1.2   PROT 6.3* 5.1*  ALBUMIN 3.7 3.1*   No results for input(s): LIPASE, AMYLASE in the last 168 hours. No results for input(s): AMMONIA in the last 168 hours. Coagulation Profile: Recent Labs  Lab 06/17/20 1918  INR 1.1   Cardiac Enzymes: No results for input(s): CKTOTAL, CKMB, CKMBINDEX, TROPONINI in the last 168 hours. BNP (last 3 results) No results for input(s): PROBNP in the last 8760 hours. HbA1C: No results for input(s): HGBA1C in the last 72 hours. CBG: Recent Labs  Lab 06/17/20 1856  GLUCAP 123*   Lipid Profile: No results for input(s): CHOL, HDL, LDLCALC, TRIG, CHOLHDL, LDLDIRECT in the last 72 hours. Thyroid Function Tests: No results for input(s): TSH, T4TOTAL, FREET4, T3FREE, THYROIDAB in the last 72 hours. Anemia Panel: No results for input(s): VITAMINB12, FOLATE, FERRITIN, TIBC, IRON, RETICCTPCT in the last 72 hours. Urine analysis:    Component Value Date/Time   COLORURINE YELLOW (A) 06/21/2020 1849   APPEARANCEUR CLOUDY (A) 06/21/2020 1849   APPEARANCEUR Hazy (A) 02/29/2020 0927   LABSPEC 1.013 06/21/2020 1849   PHURINE 5.0 06/21/2020 1849   GLUCOSEU NEGATIVE 06/21/2020 1849   HGBUR SMALL (A) 06/21/2020 1849   BILIRUBINUR NEGATIVE 06/21/2020 1849   BILIRUBINUR Negative 02/29/2020 0927   KETONESUR NEGATIVE 06/21/2020 1849   PROTEINUR NEGATIVE 06/21/2020 1849   NITRITE NEGATIVE 06/21/2020 1849   LEUKOCYTESUR LARGE (A) 06/21/2020 1849    Radiological Exams on Admission: No results found.  EKG: Independently reviewed. Normal sinus rhythm without acute ischemic changes.  QTc 440.  Assessment/Plan Principal Problem:   Syncopal episodes Active Problems:   Sick sinus syndrome (HCC)   Orthostatic hypotension   Ventricular tachycardia (HCC)  Regina Hernandez is a 86 y.o. female with medical history significant for SSS s/p PPM, mild dementia/memory loss, hypertension, osteoarthritis, essential tremor, depression, and recent admission for acute upper GI  bleeding in setting of gastric ulcers and NSAID use who is admitted for evaluation of multiple syncopal episodes.  Multiple syncopal and near syncopal episodes SSS s/p PPM with runs of V. Tach: Per EDP, pacemaker interrogation revealed 14 beat run V. tach while in the ED and 110 episodes of-treat, rate since April.  Cardiology were called by EDP and recommended starting metoprolol 25 mg twice daily.  Likely also with some element of orthostatic hypotension.  Magnesium is 1.9,  potassium 4.2. -Monitor on telemetry -Continue Lopressor 25 mg twice daily -Gentle IV fluid hydration overnight -Update echocardiogram -Obtain chest x-ray -Check troponin, TSH -Give 1 g IV magnesium and repeat labs in the morning -Further cardiology recommendations appreciated  Recent ABLA from upper GI bleed due to gastric ulcers/NSAID use: Denies any further obvious bleeding.  Hemoglobin currently stable. -Continue Protonix -Avoid NSAIDs  Asymptomatic bacteriuria: Given IV ceftriaxone in the ED.  Will hold further antibiotics for now.  Urine culture obtained and pending.  Mild dementia/memory loss/depression: Continue home Risperdal.  Will hold home fluvoxamine, venlafaxine, and Namenda for now given presenting symptoms.  Essential tremor: Holding primidone for now.  DVT prophylaxis: SCDs given recent GI bleed Code Status: DNR, confirmed with patient Family Communication: Discussed with patient, she has discussed with family Disposition Plan: From home and likely discharge to home pending further syncope/cardiac work-up/eval/management. Consults called: EDP discussed with on-call cardiology Admission status:  Status is: Observation  The patient remains OBS appropriate and will d/c before 2 midnights.  Dispo: The patient is from: Home              Anticipated d/c is to: Home              Anticipated d/c date is: 1 day pending further syncope/cardiac work-up/eval and management.              Patient  currently is not medically stable to d/c.  Zada Finders MD [facility]  If 7PM-7AM, please contact night-coverage [email or web address]  06/22/2020, 12:23 AM

## 2020-06-21 NOTE — ED Notes (Signed)
Pt sitting in lobby in no acute distress. Pt was previously updated on wait.

## 2020-06-21 NOTE — ED Notes (Signed)
Pt assisted to bathroom

## 2020-06-21 NOTE — ED Triage Notes (Signed)
Patient reports near syncope today at home. Denies hitting head or falling to the floor. She states "I caught myself on my walker" Skin tear present on arm from walker. Discharged yesterday from [facility] for GI bleed and low hgb

## 2020-06-22 ENCOUNTER — Observation Stay (HOSPITAL_BASED_OUTPATIENT_CLINIC_OR_DEPARTMENT_OTHER)
Admit: 2020-06-22 | Discharge: 2020-06-22 | Disposition: A | Discharge status: Home / Self Care | Payer: Medicare Other | Attending: Internal Medicine | Admitting: Internal Medicine

## 2020-06-22 ENCOUNTER — Observation Stay: Payer: Medicare Other

## 2020-06-22 DIAGNOSIS — I471 Supraventricular tachycardia: Secondary | ICD-10-CM

## 2020-06-22 DIAGNOSIS — I472 Ventricular tachycardia, unspecified: Secondary | ICD-10-CM

## 2020-06-22 DIAGNOSIS — I951 Orthostatic hypotension: Secondary | ICD-10-CM | POA: Diagnosis not present

## 2020-06-22 DIAGNOSIS — R55 Syncope and collapse: Secondary | ICD-10-CM | POA: Diagnosis not present

## 2020-06-22 LAB — ECHOCARDIOGRAM COMPLETE
Height: 63 in
Weight: 1968 oz

## 2020-06-22 LAB — CBC
HCT: 23.5 % — ABNORMAL LOW (ref 36.0–46.0)
Hemoglobin: 8 g/dL — ABNORMAL LOW (ref 12.0–15.0)
MCH: 32.7 pg (ref 26.0–34.0)
MCHC: 34 g/dL (ref 30.0–36.0)
MCV: 95.9 fL (ref 80.0–100.0)
Platelets: 148 10*3/uL — ABNORMAL LOW (ref 150–400)
RBC: 2.45 MIL/uL — ABNORMAL LOW (ref 3.87–5.11)
RDW: 14.4 % (ref 11.5–15.5)
WBC: 6.6 10*3/uL (ref 4.0–10.5)
nRBC: 0 % (ref 0.0–0.2)

## 2020-06-22 LAB — BASIC METABOLIC PANEL
Anion gap: 6 (ref 5–15)
BUN: 17 mg/dL (ref 8–23)
CO2: 29 mmol/L (ref 22–32)
Calcium: 8.8 mg/dL — ABNORMAL LOW (ref 8.9–10.3)
Chloride: 102 mmol/L (ref 98–111)
Creatinine, Ser: 0.82 mg/dL (ref 0.44–1.00)
GFR calc Af Amer: >60 mL/min (ref >60)
GFR calc non Af Amer: >60 mL/min (ref >60)
Glucose, Bld: 95 mg/dL (ref 70–99)
Potassium: 3.4 mmol/L — ABNORMAL LOW (ref 3.5–5.1)
Sodium: 137 mmol/L (ref 135–145)

## 2020-06-22 LAB — TROPONIN I (HIGH SENSITIVITY)
Troponin I (High Sensitivity): 8 ng/L (ref <18)
Troponin I (High Sensitivity): 11 ng/L (ref <18)

## 2020-06-22 LAB — TSH: TSH: 2.548 u[IU]/mL (ref 0.350–4.500)

## 2020-06-22 LAB — MAGNESIUM: Magnesium: 2.1 mg/dL (ref 1.7–2.4)

## 2020-06-22 LAB — SARS CORONAVIRUS 2 BY RT PCR (HOSPITAL ORDER, PERFORMED IN ~~LOC~~ HOSPITAL LAB): SARS Coronavirus 2: NEGATIVE

## 2020-06-22 LAB — GLUCOSE, CAPILLARY: Glucose-Capillary: 87 mg/dL (ref 70–99)

## 2020-06-22 LAB — PREPARE RBC (CROSSMATCH)

## 2020-06-22 MED ORDER — SODIUM CHLORIDE 0.9% FLUSH
3.0000 mL | Freq: Two times a day (BID) | INTRAVENOUS | Status: DC
  Administered 2020-06-22: 3 mL via INTRAVENOUS

## 2020-06-22 MED ORDER — ACETAMINOPHEN 650 MG RE SUPP: 650.0000 mg | Freq: Four times a day (QID) | RECTAL | Status: DC | PRN

## 2020-06-22 MED ORDER — PANTOPRAZOLE SODIUM 40 MG PO TBEC
40.0000 mg | DELAYED_RELEASE_TABLET | Freq: Two times a day (BID) | ORAL | Status: DC
  Administered 2020-06-22 – 2020-06-23 (×3): 40 mg via ORAL
  Filled 2020-06-22 (×4): qty 1

## 2020-06-22 MED ORDER — ACETAMINOPHEN 325 MG PO TABS: 650.0000 mg | ORAL_TABLET | Freq: Four times a day (QID) | ORAL | Status: DC | PRN

## 2020-06-22 MED ORDER — SODIUM CHLORIDE 0.9 % IV SOLN: INTRAVENOUS | Status: AC

## 2020-06-22 MED ORDER — RISPERIDONE 1 MG PO TABS
1.0000 mg | ORAL_TABLET | Freq: Every day | ORAL | Status: DC
  Administered 2020-06-22 (×2): 1 mg via ORAL
  Filled 2020-06-22 (×3): qty 1

## 2020-06-22 MED ORDER — MAGNESIUM SULFATE IN D5W 1-5 GM/100ML-% IV SOLN
1.0000 g | Freq: Once | INTRAVENOUS | Status: AC
  Administered 2020-06-22: 1 g via INTRAVENOUS
  Filled 2020-06-22: qty 100

## 2020-06-22 MED ORDER — POTASSIUM CHLORIDE CRYS ER 20 MEQ PO TBCR
40.0000 meq | EXTENDED_RELEASE_TABLET | Freq: Two times a day (BID) | ORAL | Status: DC
  Administered 2020-06-22 – 2020-06-23 (×3): 40 meq via ORAL
  Filled 2020-06-22 (×3): qty 2

## 2020-06-22 MED ORDER — SODIUM CHLORIDE 0.9% IV SOLUTION: Freq: Once | INTRAVENOUS | Status: AC

## 2020-06-22 NOTE — Consult Note (Signed)
Cardiology Consultation:   Patient ID: Lalaine Overstreet MRN: [id number]; DOB: 11-Aug-1934  Admit date: 06/21/2020 Date of Consult: 06/22/2020  Primary Care Provider: Elby Beck, FNP [facility] Cardiologist: Kate Sable, MD  [facility] Electrophysiologist:  None Caryl Comes    Patient Profile:   Cashlynn Yearwood is a 86 y.o. female with a hx of sick sinus syndrome, status post pacemaker implantation, mild dementia, hypertension, essential tremor and recent GI bleed.  Who is being seen today for the evaluation of syncope and nonsustained ventricular tachycardia at the request of Dr. Posey Pronto.Marland Kitchen  History of Present Illness:   Ms. Higdon has a history of sick sinus syndrome and has a pacemaker placed.  She has a history of some upper GI bleeding and recently had several episodes of near syncope and syncopal episodes.  She had several lows episodes of near syncope and 2 episodes of syncope yesterday. She has been having several episodes of "nonsustained ventricular tachycardia" on the monitor.  I have personally looked through the telemetry strips recorded on the computer and on the telemetry monitor and cannot find any episodes of nonsustained ventricular tachycardia.   She does have episodes of atrial tachycardia.  I have reviewed the pacer interrogation and have reviewed it with Dr. Rayann Heman who is the on-call electrophysiologist. The pacer interrogation also reveals what appears to be atrial tachycardia with no evidence of ventricular tachycardia. In addition, these episodes only started 1 week ago which corresponds to her GI bleed.  Her symptoms are consistent with orthostatic hypotension.  She only has episodes of lightheadedness, near-syncope, syncope right after she is gone from a seated position to a standing position.  Her hemoglobin is 8 this morning.  She is been up around this morning and has not had any episodes of near syncope or syncope.    Past Medical History:    Diagnosis Date  . Anxiety   . Cervical dystonia   . Essential tremor   . GI bleed   . Memory loss   . Migraine   . Occipital neuralgia     Past Surgical History:  Procedure Laterality Date  . BOWEL RESECTION    . CARDIAC CATHETERIZATION  2016   no stents/[facility]  . ESOPHAGOGASTRODUODENOSCOPY N/A 06/18/2020   Procedure: ESOPHAGOGASTRODUODENOSCOPY (EGD);  Surgeon: Toledo, Benay Pike, MD;  Location: [facility] ENDOSCOPY;  Service: Gastroenterology;  Laterality: N/A;  . PACEMAKER IMPLANT    . REPLACEMENT TOTAL KNEE Left      Home Medications:  Prior to Admission medications   Medication Sig Start Date End Date Taking? Authorizing Provider  acetaminophen (TYLENOL) 500 MG tablet Take 2 tablets (1,000 mg total) by mouth every 8 (eight) hours as needed for mild pain or moderate pain. 06/19/20  Yes Barb Merino, MD  aspirin (ASPIRIN 81) 81 MG EC tablet Take 1 tablet by mouth daily.    Yes [provider]  Cholecalciferol (VITAMIN D3 PO) Take 25 mcg by mouth daily.   Yes [provider]  Docusate Calcium (STOOL SOFTENER PO) Take 1 capsule by mouth in the morning and at bedtime.    Yes [provider]  estradiol (ESTRACE) 0.1 MG/GM vaginal cream Place 1 Applicatorful vaginally as needed.   Yes [provider]  fluvoxaMINE (LUVOX) 50 MG tablet Take 50 mg by mouth 2 (two) times daily.   Yes [provider]  gabapentin (NEURONTIN) 400 MG capsule Take 1 capsule (400 mg total) by mouth 3 (three) times daily. 04/22/20  Yes Suzzanne Cloud,  NP  memantine (NAMENDA) 10 MG tablet TAKE 1 TABLET BY MOUTH TWICE A DAY Patient taking differently: Take 10 mg by mouth 2 (two) times daily.  02/26/20  Yes Marcial Pacas, MD  nitrofurantoin (MACRODANTIN) 50 MG capsule Take 50 mg by mouth at bedtime.   Yes [provider]  pantoprazole (PROTONIX) 40 MG tablet Take 1 tablet (40 mg total) by mouth 2 (two) times daily. 06/19/20 07/19/20 Yes Barb Merino, MD   primidone (MYSOLINE) 50 MG tablet Take 2 tablets (100 mg total) by mouth at bedtime. 04/22/20  Yes Suzzanne Cloud, NP  risperiDONE (RISPERDAL) 2 MG tablet Take 1 mg by mouth at bedtime.    Yes [provider]  venlafaxine (EFFEXOR) 75 MG tablet Take 75 mg by mouth daily.   Yes [provider]  vitamin B-12 (CYANOCOBALAMIN) 1000 MCG tablet Take 1,000 mcg by mouth daily.   Yes [provider]    Inpatient Medications: Scheduled Meds: . metoprolol tartrate  25 mg Oral BID  . pantoprazole  40 mg Oral BID  . potassium chloride  40 mEq Oral BID  . risperiDONE  1 mg Oral QHS  . sodium chloride flush  3 mL Intravenous Q12H   Continuous Infusions: . sodium chloride 100 mL/hr at 06/22/20 1001   PRN Meds: acetaminophen **OR** acetaminophen  Allergies:    Allergies  Allergen Reactions  . Adhesive [Tape] Other (See Comments)    Contact dermatitis  . Lyrica [Pregabalin] Other (See Comments)    Unable to remember reaction.  . Baclofen Rash  . Sulfa Antibiotics Rash    Social History:   Social History   Socioeconomic History  . Marital status: Widowed    Spouse name: Not on file  . Number of children: 5  . Years of education: 12th  . Highest education level: High school graduate  Occupational History  . Occupation: Retired  Tobacco Use  . Smoking status: Never Smoker  . Smokeless tobacco: Never Used  Vaping Use  . Vaping Use: Never used  Substance and Sexual Activity  . Alcohol use: Not Currently  . Drug use: Never  . Sexual activity: Not on file  Other Topics Concern  . Not on file  Social History Narrative   Lives at home with her daughters.   Right-handed.   Two cups caffeine per day.   Social Determinants of Health   Financial Resource Strain:   . Difficulty of Paying Living Expenses:   Food Insecurity:   . Worried About Charity fundraiser in the Last Year:   . Arboriculturist in the Last Year:   Transportation Needs:   . Lexicographer (Medical):   Marland Kitchen Lack of Transportation (Non-Medical):   Physical Activity:   . Days of Exercise per Week:   . Minutes of Exercise per Session:   Stress:   . Feeling of Stress :   Social Connections:   . Frequency of Communication with Friends and Family:   . Frequency of Social Gatherings with Friends and Family:   . Attends Religious Services:   . Active Member of Clubs or Organizations:   . Attends Archivist Meetings:   Marland Kitchen Marital Status:   Intimate Partner Violence:   . Fear of Current or Ex-Partner:   . Emotionally Abused:   Marland Kitchen Physically Abused:   . Sexually Abused:     Family History:    Family History  Problem Relation Age of Onset  . Other Mother        "  old age"  . Ulcers Father   . Stomach cancer Brother      ROS:  Please see the history of present illness.   All other ROS reviewed and negative.     Physical Exam/Data:   Vitals:   06/22/20 0946 06/22/20 0949 06/22/20 0950 06/22/20 0954  BP: 140/73 (!) 150/80 129/86 (!) 143/86  Pulse: 70 71 85 88  Resp: 18 18 18 18   Temp:      TempSrc:      SpO2: 97% 100%    Weight:      Height:        Intake/Output Summary (Last 24 hours) at 06/22/2020 1014 Last data filed at 06/22/2020 1009 Gross per 24 hour  Intake 265.92 ml  Output 500 ml  Net -234.08 ml   Last 3 Weights 06/22/2020 06/21/2020 06/18/2020  Weight (lbs) 123 lb 132 lb 132 lb  Weight (kg) 55.792 kg 59.875 kg 59.875 kg     Body mass index is 21.79 kg/m.  General:  Elderly , frail female, NAD  HEENT: normal Lymph: no adenopathy Neck: no JVD Endocrine:  No thryomegaly Vascular: No carotid bruits; FA pulses 2+ bilaterally without bruits  Cardiac:  normal S1, S2; RRR; no murmur  Lungs:  clear to auscultation bilaterally, no wheezing, rhonchi or rales  Abd: soft, nontender, no hepatomegaly  Ext: no edema Musculoskeletal:  No deformities, BUE and BLE strength normal and equal Skin: warm and dry  Neuro:  CNs 2-12 intact, no  focal abnormalities noted Psych:  Normal affect   EKG:  The EKG was personally reviewed and demonstrates:   NSR   Telemetry:  Telemetry was personally reviewed and demonstrates:  NSR with episodes of atrial tach -   In reviewing the pacer interrogation, these episodes of atrial tachycardia just charted last week in the setting of her GI bleed and anemia.  Relevant CV Studies:   Laboratory Data:  High Sensitivity Troponin:   Recent Labs  Lab 06/22/20 0051 06/22/20 0338  TROPONINIHS 8 11     Chemistry Recent Labs  Lab 06/19/20 0139 06/21/20 1612 06/22/20 0338  NA 138 135 137  K 3.7 4.2 3.4*  CL 107 99 102  CO2 26 30 29   GLUCOSE 89 110* 95  BUN 17 19 17   CREATININE 0.70 0.92 0.82  CALCIUM 8.5* 9.4 8.8*  GFRNONAA >60 57* >60  GFRAA >60 >60 >60  ANIONGAP 5 6 6     Recent Labs  Lab 06/17/20 1918 06/18/20 0608  PROT 6.3* 5.1*  ALBUMIN 3.7 3.1*  AST 19 13*  ALT 12 10  ALKPHOS 40 28*  BILITOT 0.6 1.2   Hematology Recent Labs  Lab 06/19/20 0139 06/21/20 1612 06/22/20 0338  WBC 6.7 9.8 6.6  RBC 2.56* 2.68* 2.45*  HGB 8.1* 8.6* 8.0*  HCT 24.5* 25.9* 23.5*  MCV 95.7 96.6 95.9  MCH 31.6 32.1 32.7  MCHC 33.1 33.2 34.0  RDW 13.9 13.9 14.4  PLT 101* 171 148*   BNPNo results for input(s): BNP, PROBNP in the last 168 hours.  DDimer No results for input(s): DDIMER in the last 168 hours.   Radiology/Studies:  DG Chest 2 View  Result Date: 06/22/2020 CLINICAL DATA:  Chest pain EXAM: CHEST - 2 VIEW COMPARISON:  None. FINDINGS: The heart size and mediastinal contours are within normal limits. Both lungs are clear. The visualized skeletal structures are unremarkable. Expected location of left chest wall pacemaker leads. IMPRESSION: No active cardiopulmonary disease. Electronically Signed   By: Lennette Bihari  [place] M.D.   On: 06/22/2020 01:00     Assessment and Plan:   1. Presyncope/syncope: All of her symptoms are entirely consistent with orthostatic hypotension.  She  only has symptoms when she goes from a seated to a standing position.  These correspond to her GI bleeding and anemia.  She never had episodes before her GI bleed.  I reviewed the pacer interrogation and she is not having episodes of nonsustained VT.  She does have episodes of atrial tachycardia which is a relatively benign arrhythmia and is due to her anemia and volume depletion.  The atrial tachycardia is not the cause of her syncope.  I discussed the case with Dr. Sloan Leiter.  She may qualify for transfusion based on symptomatic anemia but probably just is adamant as important is she needs to drink lots of fluids.  Of asked her to liberalize her salt intake a little bit.  I have asked her to make sure she gets enough protein.  I discontinued her metoprolol because this might actually worsen her episodes of orthostasis.  2.  Question ventricular tachycardia.  I reviewed the pacer interrogation.  She is having episodes of atrial tachycardia, not ventricular tachycardia.  I reviewed these with Dr. Rayann Heman who is our on-call electrophysiologist and he agrees that these are atrial tachycardia and not VT.  Importantly, these episodes only started last week when she developed this GI bleed.  This is suggest that if that the episodes of atrial tachycardia are due to the stress involved with volume depletion and anemia.  I prescribed metoprolol empirically last night over the phone but I discontinued it today.  The metoprolol was very effective at suppressing the atrial tachycardia but I am worried that it might actually worsen her episodes of orthostatic hypotension.  I think the better treatment for her episodes of atrial tachycardias correction of her anemia and to make sure that she is adequately hydrated.    For questions or updates, please contact [place] Please consult [email or web address] for contact info under    Signed, Mertie Moores, MD  06/22/2020 10:14 AM

## 2020-06-22 NOTE — Plan of Care (Signed)

## 2020-06-22 NOTE — Progress Notes (Signed)
*  PRELIMINARY RESULTS* Echocardiogram 2D Echocardiogram has been performed.  Tava Peery S See Beharry 06/22/2020, 2:25 PM 

## 2020-06-22 NOTE — Plan of Care (Signed)
  Problem: Education: Goal: Knowledge of General Education information will improve Description: Including pain rating scale, medication(s)/side effects and non-pharmacologic comfort measures Outcome: Progressing   Problem: Clinical Measurements: Goal: Will remain free from infection Outcome: Progressing Goal: Diagnostic test results will improve Outcome: Progressing Goal: Respiratory complications will improve Outcome: Progressing  One assist to [facility]. Problem: Activity: Goal: Risk for activity intolerance will decrease Outcome: Progressing

## 2020-06-22 NOTE — Progress Notes (Signed)
PROGRESS NOTE    Regina Hernandez  KDX:[id number] DOB: 30-Jun-1934 DOA: 06/21/2020 PCP: Elby Beck, FNP    Brief Narrative:  86 year old female with history of sick sinus syndrome status post permanent pacemaker, mild dementia and memory loss, hypertension, osteoarthritis, depression, recent admission for for upper GI bleeding in the setting of gastric ulcer and NSAID usage, recent onset episodes of syncope and presyncope admitted with multiple syncopal episodes. Patient was in the hospital 6/23-6/24 for syncope, she was found to have drop in hemoglobin from 12-8, 1 unit PRBC transfusion, EGD showed large nonbleeding gastric ulcer discharged on PPI.  She continued to have multiple symptoms.  On the day of admission, she had 4 episodes of lightheadedness/dizziness every time when she is standing up.  In 2 of those episodes she did lose consciousness but avoid falling. In the emergency room blood pressure 91/55.  Hemoglobin 8.6, positive orthostasis.  Suspected nonsustained V. tach, started on metoprolol and admitted to hospital.   Assessment & Plan:   Principal Problem:   Syncopal episodes Active Problems:   Sick sinus syndrome (HCC)   Orthostatic hypotension   Ventricular tachycardia (HCC)  Syncopal episodes: Multiple syncopal episodes, positive orthostatic, recent blood loss anemia due to GI bleeding. Pacemaker interrogated, followed by cardiology, echocardiogram normal.  Pacemaker showed no ventricular tachycardia, some atrial tachycardia.  Cardiology recommended no rate control but to continue volume expansion and hemodynamics improvement.  Symptomatic anemia: Patient continues to have multiple episodes of syncope in the context of hemoglobin at or less than 8.  She has extensive cardiac morbidities and getting recurrent symptoms.  Resuscitated with volume.  Given significant symptoms, will transfuse 1 unit of PRBC.  Patient consented.  We will recheck hemoglobin tomorrow  morning.  Peptic ulcer disease: On PPI twice a day.  Stable.  Dementia/anxiety: On multiple SSRIs.  Will resume today.  Patient consented to a blood transfusion.  She has symptomatic anemia, PRBC transfusion will help relieve her symptoms.  DVT prophylaxis: SCDs Start: 06/22/20 0012   Code Status: DNR Family Communication: Patient's daughter at the bedside Disposition Plan: Status is: Observation  The patient will require care spanning > 2 midnights and should be moved to inpatient because: Hemodynamically unstable and IV treatments appropriate due to intensity of illness or inability to take PO  Dispo: The patient is from: Home              Anticipated d/c is to: Home              Anticipated d/c date is: 2 days              Patient currently is not medically stable to d/c.         Consultants:   Cardiology  Procedures:   None  Antimicrobials:   None none.  Patient was given 1 dose of Rocephin in ER.   Subjective: Patient seen and examined.  No overnight events.  Her daughter was at the bedside.  Patient herself is poor historian and denies any symptoms.  She has not mobilized yet.  Orthostatic were positive before fluid administration .  Objective: Vitals:   06/22/20 0949 06/22/20 0950 06/22/20 0954 06/22/20 1210  BP: (!) 150/80 129/86 (!) 143/86 (!) 147/75  Pulse: 71 85 88 87  Resp: 18 18 18 18   Temp:      TempSrc:      SpO2: 100%   99%  Weight:      Height:  Intake/Output Summary (Last 24 hours) at 06/22/2020 1552 Last data filed at 06/22/2020 1500 Gross per 24 hour  Intake 468.53 ml  Output 802 ml  Net -333.47 ml   Filed Weights   06/21/20 1606 06/22/20 0436  Weight: 59.9 kg 55.8 kg    Examination:  General exam: Appears calm and comfortable , pale looking  Respiratory system: Clear to auscultation. Respiratory effort normal. Cardiovascular system: S1 & S2 heard, RRR. No JVD, murmurs, rubs, gallops or clicks. No pedal edema. Pace maker  in place.  Gastrointestinal system: Abdomen is nondistended, soft and nontender. No organomegaly or masses felt. Normal bowel sounds heard. Central nervous system: Alert and oriented. No focal neurological deficits. Extremities: Symmetric 5 x 5 power. Skin: No rashes, lesions or ulcers Psychiatry: Judgement and insight appear normal. Mood & affect appropriate.     Data Reviewed: I have personally reviewed following labs and imaging studies  CBC: Recent Labs  Lab 06/18/20 0722 06/18/20 1829 06/19/20 0139 06/21/20 1612 06/22/20 0338  WBC 6.9 8.5 6.7 9.8 6.6  HGB 8.8* 8.8* 8.1* 8.6* 8.0*  HCT 25.9* 25.6* 24.5* 25.9* 23.5*  MCV 93.2 92.8 95.7 96.6 95.9  PLT 112* 103* 101* 171 242*   Basic Metabolic Panel: Recent Labs  Lab 06/17/20 1918 06/18/20 0608 06/19/20 0139 06/21/20 1612 06/22/20 0338  NA 133* 139 138 135 137  K 3.7 3.9 3.7 4.2 3.4*  CL 102 109 107 99 102  CO2 22 24 26 30 29   GLUCOSE 126* 94 89 110* 95  BUN 56* 35* 17 19 17   CREATININE 0.93 0.84 0.70 0.92 0.82  CALCIUM 9.1 8.5* 8.5* 9.4 8.8*  MG  --   --  1.9 1.9 2.1  PHOS  --   --  2.5  --   --    GFR: Estimated Creatinine Clearance: 42.2 mL/min (by C-G formula based on SCr of 0.82 mg/dL). Liver Function Tests: Recent Labs  Lab 06/17/20 1918 06/18/20 0608  AST 19 13*  ALT 12 10  ALKPHOS 40 28*  BILITOT 0.6 1.2  PROT 6.3* 5.1*  ALBUMIN 3.7 3.1*   No results for input(s): LIPASE, AMYLASE in the last 168 hours. No results for input(s): AMMONIA in the last 168 hours. Coagulation Profile: Recent Labs  Lab 06/17/20 1918  INR 1.1   Cardiac Enzymes: No results for input(s): CKTOTAL, CKMB, CKMBINDEX, TROPONINI in the last 168 hours. BNP (last 3 results) No results for input(s): PROBNP in the last 8760 hours. HbA1C: No results for input(s): HGBA1C in the last 72 hours. CBG: Recent Labs  Lab 06/17/20 1856 06/22/20 0802  GLUCAP 123* 87   Lipid Profile: No results for input(s): CHOL, HDL, LDLCALC,  TRIG, CHOLHDL, LDLDIRECT in the last 72 hours. Thyroid Function Tests: Recent Labs    06/22/20 0338  TSH 2.548   Anemia Panel: No results for input(s): VITAMINB12, FOLATE, FERRITIN, TIBC, IRON, RETICCTPCT in the last 72 hours. Sepsis Labs: No results for input(s): PROCALCITON, LATICACIDVEN in the last 168 hours.  Recent Results (from the past 240 hour(s))  SARS Coronavirus 2 by RT PCR (hospital order, performed in [facility] hospital lab) Nasopharyngeal Nasopharyngeal Swab     Status: None   Collection Time: 06/17/20  8:13 PM   Specimen: Nasopharyngeal Swab  Result Value Ref Range Status   SARS Coronavirus 2 NEGATIVE NEGATIVE Final    Comment: (NOTE) SARS-CoV-2 target nucleic acids are NOT DETECTED.  The SARS-CoV-2 RNA is generally detectable in upper and lower respiratory specimens during the  acute phase of infection. The lowest concentration of SARS-CoV-2 viral copies this assay can detect is 250 copies / mL. A negative result does not preclude SARS-CoV-2 infection and should not be used as the sole basis for treatment or other patient management decisions.  A negative result may occur with improper specimen collection / handling, submission of specimen other than nasopharyngeal swab, presence of viral mutation(s) within the areas targeted by this assay, and inadequate number of viral copies (<250 copies / mL). A negative result must be combined with clinical observations, patient history, and epidemiological information.  Fact Sheet for Patients:   StrictlyIdeas.no  Fact Sheet for Healthcare Providers: BankingDealers.co.za  This test is not yet approved or  cleared by the [place] FDA and has been authorized for detection and/or diagnosis of SARS-CoV-2 by FDA under an Emergency Use Authorization (EUA).  This EUA will remain in effect (meaning this test can be used) for the duration of the COVID-19 declaration under  Section 564(b)(1) of the Act, 21 U.S.C. section 360bbb-3(b)(1), unless the authorization is terminated or revoked sooner.  Performed at [facility], [address] [place], [place] [address]   SARS Coronavirus 2 by RT PCR (hospital order, performed in [facility] hospital lab) Nasopharyngeal Nasopharyngeal Swab     Status: None   Collection Time: 06/21/20 11:31 PM   Specimen: Nasopharyngeal Swab  Result Value Ref Range Status   SARS Coronavirus 2 NEGATIVE NEGATIVE Final    Comment: (NOTE) SARS-CoV-2 target nucleic acids are NOT DETECTED.  The SARS-CoV-2 RNA is generally detectable in upper and lower respiratory specimens during the acute phase of infection. The lowest concentration of SARS-CoV-2 viral copies this assay can detect is 250 copies / mL. A negative result does not preclude SARS-CoV-2 infection and should not be used as the sole basis for treatment or other patient management decisions.  A negative result may occur with improper specimen collection / handling, submission of specimen other than nasopharyngeal swab, presence of viral mutation(s) within the areas targeted by this assay, and inadequate number of viral copies (<250 copies / mL). A negative result must be combined with clinical observations, patient history, and epidemiological information.  Fact Sheet for Patients:   StrictlyIdeas.no  Fact Sheet for Healthcare Providers: BankingDealers.co.za  This test is not yet approved or  cleared by the [place] FDA and has been authorized for detection and/or diagnosis of SARS-CoV-2 by FDA under an Emergency Use Authorization (EUA).  This EUA will remain in effect (meaning this test can be used) for the duration of the COVID-19 declaration under Section 564(b)(1) of the Act, 21 U.S.C. section 360bbb-3(b)(1), unless the authorization is terminated or revoked sooner.  Performed at [facility],  [address], [place], [place] [address]          Radiology Studies: DG Chest 2 View  Result Date: 06/22/2020 CLINICAL DATA:  Chest pain EXAM: CHEST - 2 VIEW COMPARISON:  None. FINDINGS: The heart size and mediastinal contours are within normal limits. Both lungs are clear. The visualized skeletal structures are unremarkable. Expected location of left chest wall pacemaker leads. IMPRESSION: No active cardiopulmonary disease. Electronically Signed   By: Ulyses Jarred M.D.   On: 06/22/2020 01:00   ECHOCARDIOGRAM COMPLETE  Result Date: 06/22/2020    ECHOCARDIOGRAM REPORT   Patient Name:   [facility] Date of Exam: 06/22/2020 Medical Rec #:  [id number]     Height:       63.0 in Accession #:  [id number]    Weight:       123.0 lb Date of Birth:  [birth year]     BSA:          1.573 m Patient Age:    40 years      BP:           147/75 mmHg Patient Gender: F             HR:           72 bpm. Exam Location:  [facility] Procedure: 2D Echo Indications:     Ventricular Tachycardia I47.2  History:         Patient has prior history of Echocardiogram examinations, most                  recent 03/07/2020.  Sonographer:     Arville Go RDCS Referring Phys:  [id number] Cleaster Corin PATEL Diagnosing Phys: Mertie Moores MD IMPRESSIONS  1. Left ventricular ejection fraction, by estimation, is 65 to 70%. The left ventricle has normal function. The left ventricle has no regional wall motion abnormalities. There is mild left ventricular hypertrophy. Left ventricular diastolic parameters are consistent with Grade I diastolic dysfunction (impaired relaxation).  2. Right ventricular systolic function is normal. The right ventricular size is normal.  3. The mitral valve is grossly normal. No evidence of mitral valve regurgitation. No evidence of mitral stenosis.  4. The aortic valve is tricuspid. Aortic valve regurgitation is not visualized. Mild aortic valve sclerosis is present, with no evidence of aortic valve stenosis. FINDINGS   Left Ventricle: Left ventricular ejection fraction, by estimation, is 65 to 70%. The left ventricle has normal function. The left ventricle has no regional wall motion abnormalities. The left ventricular internal cavity size was normal in size. There is  mild left ventricular hypertrophy. Left ventricular diastolic parameters are consistent with Grade I diastolic dysfunction (impaired relaxation). Right Ventricle: The right ventricular size is normal. No increase in right ventricular wall thickness. Right ventricular systolic function is normal. Left Atrium: Left atrial size was normal in size. Right Atrium: Right atrial size was normal in size. Pericardium: There is no evidence of pericardial effusion. Mitral Valve: The mitral valve is grossly normal. No evidence of mitral valve regurgitation. No evidence of mitral valve stenosis. Tricuspid Valve: The tricuspid valve is grossly normal. Tricuspid valve regurgitation is not demonstrated. No evidence of tricuspid stenosis. Aortic Valve: The aortic valve is tricuspid. Aortic valve regurgitation is not visualized. Mild aortic valve sclerosis is present, with no evidence of aortic valve stenosis. Aortic valve peak gradient measures 10.8 mmHg. Pulmonic Valve: The pulmonic valve was grossly normal. Pulmonic valve regurgitation is not visualized. Aorta: The aortic root and ascending aorta are structurally normal, with no evidence of dilitation. IAS/Shunts: The atrial septum is grossly normal.  LEFT VENTRICLE PLAX 2D LVIDd:         3.24 cm  Diastology LVIDs:         2.10 cm  LV e' lateral:   10.40 cm/s LV PW:         1.21 cm  LV E/e' lateral: 10.3 LV IVS:        1.20 cm  LV e' medial:    7.18 cm/s LVOT diam:     2.10 cm  LV E/e' medial:  14.9 LV SV:         118 LV SV Index:   75 LVOT Area:     3.46 cm  RIGHT VENTRICLE RV Basal diam:  3.06 cm TAPSE (M-mode): 2.6 cm LEFT ATRIUM             Index       RIGHT ATRIUM           Index LA diam:        2.70 cm 1.72 cm/m  RA Area:      12.00 cm LA Vol (A2C):   32.2 ml 20.47 ml/m RA Volume:   25.20 ml  16.02 ml/m LA Vol (A4C):   32.3 ml 20.54 ml/m LA Biplane Vol: 35.3 ml 22.45 ml/m  AORTIC VALVE                 PULMONIC VALVE AV Area (Vmax): 3.27 cm     PV Vmax:       0.95 m/s AV Vmax:        164.00 cm/s  PV Peak grad:  3.6 mmHg AV Peak Grad:   10.8 mmHg LVOT Vmax:      155.00 cm/s LVOT Vmean:     113.000 cm/s LVOT VTI:       0.341 m  AORTA Ao Root diam: 3.00 cm Ao Asc diam:  3.10 cm MITRAL VALVE                TRICUSPID VALVE MV Area (PHT): 4.15 cm     TV Peak grad:   23.7 mmHg MV Decel Time: 183 msec     TV Vmax:        2.44 m/s MV E velocity: 107.00 cm/s MV A velocity: 121.00 cm/s  SHUNTS MV E/A ratio:  0.88         Systemic VTI:  0.34 m                             Systemic Diam: 2.10 cm Mertie Moores MD Electronically signed by Mertie Moores MD Signature Date/Time: 06/22/2020/3:14:56 PM    Final         Scheduled Meds: . sodium chloride   Intravenous Once  . pantoprazole  40 mg Oral BID  . potassium chloride  40 mEq Oral BID  . risperiDONE  1 mg Oral QHS  . sodium chloride flush  3 mL Intravenous Q12H   Continuous Infusions:   LOS: 0 days    Time spent: 30 minutes     Barb Merino, MD [facility] Pager [phone]

## 2020-06-23 DIAGNOSIS — R55 Syncope and collapse: Secondary | ICD-10-CM | POA: Diagnosis not present

## 2020-06-23 DIAGNOSIS — I951 Orthostatic hypotension: Secondary | ICD-10-CM | POA: Diagnosis not present

## 2020-06-23 DIAGNOSIS — I495 Sick sinus syndrome: Secondary | ICD-10-CM

## 2020-06-23 LAB — TYPE AND SCREEN
ABO/RH(D): A POS
Antibody Screen: NEGATIVE
Unit division: 0

## 2020-06-23 LAB — BPAM RBC
Blood Product Expiration Date: 202107082359
ISSUE DATE / TIME: 202106271701
Unit Type and Rh: 600

## 2020-06-23 LAB — HEMOGLOBIN AND HEMATOCRIT, BLOOD
HCT: 31.7 % — ABNORMAL LOW (ref 36.0–46.0)
Hemoglobin: 10.6 g/dL — ABNORMAL LOW (ref 12.0–15.0)

## 2020-06-23 MED ORDER — CEPHALEXIN 500 MG PO CAPS: 500.0000 mg | ORAL_CAPSULE | Freq: Three times a day (TID) | ORAL | 0 refills | Status: AC

## 2020-06-23 NOTE — Telephone Encounter (Signed)
Spoke with patient daughter [name] - aware that Jackelyn Poling requested a sooner appt. Pt rescheduled to Friday 06/27/20 at 12pm  Nothing further needed.

## 2020-06-23 NOTE — Discharge Summary (Addendum)
Physician Discharge Summary  Regina Hernandez JJO:[id number] DOB: 1934/10/07 DOA: 06/21/2020  PCP: Elby Beck, FNP  Admit date: 06/21/2020 Discharge date: 06/23/2020  Admitted From: Home Disposition: Home  Recommendations for Outpatient Follow-up:  1. Follow up with PCP in 1-2 weeks 2. Please obtain BMP/CBC in one week 3. Follow-up with your providers as scheduled previously.   Discharge Condition: Fair CODE STATUS: DNR Diet recommendation: Low-salt diet, plenty of water  Discharge summary: 86 year old female with history of sick sinus syndrome status post permanent pacemaker, mild dementia and memory loss, hypertension, osteoarthritis, depression, recent admission for for upper GI bleeding in the setting of gastric ulcer and NSAID usage, recent onset episodes of syncope and presyncope admitted with multiple syncopal episodes. Patient was in the hospital 6/23-6/24 for syncope, she was found to have drop in hemoglobin from 12-8, 1 unit PRBC transfusion, EGD showed large nonbleeding gastric ulcer discharged on PPI.  She continued to have multiple symptoms.  On the day of admission, she had 4 episodes of lightheadedness/dizziness every time when she is standing up.  In 2 of those episodes she did lose consciousness but avoid falling.  In the emergency room blood pressure 91/55.  Hemoglobin 8.6, positive orthostasis.  Suspected nonsustained V. tach, started on metoprolol and admitted to hospital.  Syncopal episodes with symptomatic anemia in the context of recent GI bleed: Pacemaker interrogated, followed by cardiology, echocardiogram normal.  Pacemaker showed no ventricular tachycardia, some atrial tachycardia.  Cardiology recommended no rate control but to continue volume expansion and hemodynamics improvement. Patient continued to have multiple episodes of syncope in the context of hemoglobin at or less than 8.  She has extensive cardiac morbidities and getting recurrent symptoms.   Resuscitated with volume.  Given significant symptoms, also transfused 1 unit of PRBC with appropriate response.  Hemoglobin is 10.  Previously received Feraheme last week. She was mobilized with no symptom recurrence.  She did have tachycardia but no drop in systolic blood pressure.  With adequate stability, she is going home today with her daughter. Advised adequate fluid intake, advised good nutrition. She will take all orthostatic and fall precautions.  Patient is on multiple medications including SSRIs that she is taking for a long time, will continue.  Addendum, 11:32 AM Admission urine culture growing more than 100,000 colonies of E. coli.  On presentation she received a dose of Rocephin 1 g IV.  Patient is not sure about her urinary symptoms.  Has history of recurrent E. coli UTI and at once he was on maintenance Macrobid. With her previous sensitivity results, will prescribe Keflex 500 mg 3 times a day for 7 days. If she has any resistant organism, I will review her culture and sensitivity. I called and updated patient's daughter Ms. [name] on the phone.  Discharge Diagnoses:  Principal Problem:   Syncopal episodes Active Problems:   Sick sinus syndrome (HCC)   Orthostatic hypotension   Ventricular tachycardia [facility])    Discharge Instructions  Discharge Instructions    Diet - low sodium heart healthy   Complete by: As directed    Discharge instructions   Complete by: As directed    Drink plenty of water.  Avoid fall.  Orthostatic precautions to avoid fall and dizziness.   Increase activity slowly   Complete by: As directed      Allergies as of 06/23/2020      Reactions   Adhesive [tape] Other (See Comments)   Contact dermatitis   Lyrica [pregabalin] Other (See Comments)  Unable to remember reaction.   Baclofen Rash   Sulfa Antibiotics Rash      Medication List    STOP taking these medications   nitrofurantoin 50 MG capsule Commonly known as: MACRODANTIN      TAKE these medications   acetaminophen 500 MG tablet Commonly known as: TYLENOL Take 2 tablets (1,000 mg total) by mouth every 8 (eight) hours as needed for mild pain or moderate pain.   Aspirin 81 81 MG EC tablet Generic drug: aspirin Take 1 tablet by mouth daily.   cephALEXin 500 MG capsule Commonly known as: KEFLEX Take 1 capsule (500 mg total) by mouth 3 (three) times daily for 7 days.   estradiol 0.1 MG/GM vaginal cream Commonly known as: ESTRACE Place 1 Applicatorful vaginally as needed.   fluvoxaMINE 50 MG tablet Commonly known as: LUVOX Take 50 mg by mouth 2 (two) times daily.   gabapentin 400 MG capsule Commonly known as: NEURONTIN Take 1 capsule (400 mg total) by mouth 3 (three) times daily.   memantine 10 MG tablet Commonly known as: NAMENDA TAKE 1 TABLET BY MOUTH TWICE A DAY   pantoprazole 40 MG tablet Commonly known as: PROTONIX Take 1 tablet (40 mg total) by mouth 2 (two) times daily.   primidone 50 MG tablet Commonly known as: MYSOLINE Take 2 tablets (100 mg total) by mouth at bedtime.   risperiDONE 2 MG tablet Commonly known as: RISPERDAL Take 1 mg by mouth at bedtime.   STOOL SOFTENER PO Take 1 capsule by mouth in the morning and at bedtime.   venlafaxine 75 MG tablet Commonly known as: EFFEXOR Take 75 mg by mouth daily.   vitamin B-12 1000 MCG tablet Commonly known as: CYANOCOBALAMIN Take 1,000 mcg by mouth daily.   VITAMIN D3 PO Take 25 mcg by mouth daily.       Allergies  Allergen Reactions  . Adhesive [Tape] Other (See Comments)    Contact dermatitis  . Lyrica [Pregabalin] Other (See Comments)    Unable to remember reaction.  . Baclofen Rash  . Sulfa Antibiotics Rash    Consultations:  Cardiology   Procedures/Studies: DG Chest 2 View  Result Date: 06/22/2020 CLINICAL DATA:  Chest pain EXAM: CHEST - 2 VIEW COMPARISON:  None. FINDINGS: The heart size and mediastinal contours are within normal limits. Both lungs are  clear. The visualized skeletal structures are unremarkable. Expected location of left chest wall pacemaker leads. IMPRESSION: No active cardiopulmonary disease. Electronically Signed   By: Ulyses Jarred M.D.   On: 06/22/2020 01:00   ECHOCARDIOGRAM COMPLETE  Result Date: 06/22/2020    ECHOCARDIOGRAM REPORT   Patient Name:   [facility] Date of Exam: 06/22/2020 Medical Rec #:  [id number]     Height:       63.0 in Accession #:    [id number]    Weight:       123.0 lb Date of Birth:  1934-04-13     BSA:          1.573 m Patient Age:    60 years      BP:           147/75 mmHg Patient Gender: F             HR:           72 bpm. Exam Location:  [facility] Procedure: 2D Echo Indications:     Ventricular Tachycardia I47.2  History:         Patient has prior history  of Echocardiogram examinations, most                  recent 03/07/2020.  Sonographer:     Arville Go RDCS Referring Phys:  [id number] Cleaster Corin PATEL Diagnosing Phys: Mertie Moores MD IMPRESSIONS  1. Left ventricular ejection fraction, by estimation, is 65 to 70%. The left ventricle has normal function. The left ventricle has no regional wall motion abnormalities. There is mild left ventricular hypertrophy. Left ventricular diastolic parameters are consistent with Grade I diastolic dysfunction (impaired relaxation).  2. Right ventricular systolic function is normal. The right ventricular size is normal.  3. The mitral valve is grossly normal. No evidence of mitral valve regurgitation. No evidence of mitral stenosis.  4. The aortic valve is tricuspid. Aortic valve regurgitation is not visualized. Mild aortic valve sclerosis is present, with no evidence of aortic valve stenosis. FINDINGS  Left Ventricle: Left ventricular ejection fraction, by estimation, is 65 to 70%. The left ventricle has normal function. The left ventricle has no regional wall motion abnormalities. The left ventricular internal cavity size was normal in size. There is  mild left ventricular  hypertrophy. Left ventricular diastolic parameters are consistent with Grade I diastolic dysfunction (impaired relaxation). Right Ventricle: The right ventricular size is normal. No increase in right ventricular wall thickness. Right ventricular systolic function is normal. Left Atrium: Left atrial size was normal in size. Right Atrium: Right atrial size was normal in size. Pericardium: There is no evidence of pericardial effusion. Mitral Valve: The mitral valve is grossly normal. No evidence of mitral valve regurgitation. No evidence of mitral valve stenosis. Tricuspid Valve: The tricuspid valve is grossly normal. Tricuspid valve regurgitation is not demonstrated. No evidence of tricuspid stenosis. Aortic Valve: The aortic valve is tricuspid. Aortic valve regurgitation is not visualized. Mild aortic valve sclerosis is present, with no evidence of aortic valve stenosis. Aortic valve peak gradient measures 10.8 mmHg. Pulmonic Valve: The pulmonic valve was grossly normal. Pulmonic valve regurgitation is not visualized. Aorta: The aortic root and ascending aorta are structurally normal, with no evidence of dilitation. IAS/Shunts: The atrial septum is grossly normal.  LEFT VENTRICLE PLAX 2D LVIDd:         3.24 cm  Diastology LVIDs:         2.10 cm  LV e' lateral:   10.40 cm/s LV PW:         1.21 cm  LV E/e' lateral: 10.3 LV IVS:        1.20 cm  LV e' medial:    7.18 cm/s LVOT diam:     2.10 cm  LV E/e' medial:  14.9 LV SV:         118 LV SV Index:   75 LVOT Area:     3.46 cm  RIGHT VENTRICLE RV Basal diam:  3.06 cm TAPSE (M-mode): 2.6 cm LEFT ATRIUM             Index       RIGHT ATRIUM           Index LA diam:        2.70 cm 1.72 cm/m  RA Area:     12.00 cm LA Vol (A2C):   32.2 ml 20.47 ml/m RA Volume:   25.20 ml  16.02 ml/m LA Vol (A4C):   32.3 ml 20.54 ml/m LA Biplane Vol: 35.3 ml 22.45 ml/m  AORTIC VALVE  PULMONIC VALVE AV Area (Vmax): 3.27 cm     PV Vmax:       0.95 m/s AV Vmax:        164.00  cm/s  PV Peak grad:  3.6 mmHg AV Peak Grad:   10.8 mmHg LVOT Vmax:      155.00 cm/s LVOT Vmean:     113.000 cm/s LVOT VTI:       0.341 m  AORTA Ao Root diam: 3.00 cm Ao Asc diam:  3.10 cm MITRAL VALVE                TRICUSPID VALVE MV Area (PHT): 4.15 cm     TV Peak grad:   23.7 mmHg MV Decel Time: 183 msec     TV Vmax:        2.44 m/s MV E velocity: 107.00 cm/s MV A velocity: 121.00 cm/s  SHUNTS MV E/A ratio:  0.88         Systemic VTI:  0.34 m                             Systemic Diam: 2.10 cm Mertie Moores MD Electronically signed by Mertie Moores MD Signature Date/Time: 06/22/2020/3:14:56 PM    Final    (Echo, Carotid, EGD, Colonoscopy, ERCP)    Subjective: Patient seen and examined.  No overnight events.  Telemetry with sinus rhythm.  Had some sinus tachycardia on walking but asymptomatic.  Blood pressure remained stable. Eager to go home.   Discharge Exam: Vitals:   06/23/20 0959 06/23/20 1101  BP: 108/87 (!) 135/97  Pulse: (!) 155 (!) 108  Resp:  17  Temp:  (!) 97.5 F (36.4 C)  SpO2: 100% 99%   Vitals:   06/23/20 0820 06/23/20 0950 06/23/20 0959 06/23/20 1101  BP: (!) 162/97 (!) 155/100 108/87 (!) 135/97  Pulse: 88 (!) 115 (!) 155 (!) 108  Resp: 20 16  17   Temp: 97.7 F (36.5 C)   (!) 97.5 F (36.4 C)  TempSrc: Oral   Oral  SpO2: 98% 99% 100% 99%  Weight:      Height:        General: Pt is alert, awake, not in acute distress Cardiovascular: RRR, S1/S2 +, no rubs, no gallops, pacemaker in place. Respiratory: CTA bilaterally, no wheezing, no rhonchi Abdominal: Soft, NT, ND, bowel sounds + Extremities: no edema, no cyanosis    The results of significant diagnostics from this hospitalization (including imaging, microbiology, ancillary and laboratory) are listed below for reference.     Microbiology: Recent Results (from the past 240 hour(s))  SARS Coronavirus 2 by RT PCR (hospital order, performed in [facility] hospital lab) Nasopharyngeal Nasopharyngeal Swab      Status: None   Collection Time: 06/17/20  8:13 PM   Specimen: Nasopharyngeal Swab  Result Value Ref Range Status   SARS Coronavirus 2 NEGATIVE NEGATIVE Final    Comment: (NOTE) SARS-CoV-2 target nucleic acids are NOT DETECTED.  The SARS-CoV-2 RNA is generally detectable in upper and lower respiratory specimens during the acute phase of infection. The lowest concentration of SARS-CoV-2 viral copies this assay can detect is 250 copies / mL. A negative result does not preclude SARS-CoV-2 infection and should not be used as the sole basis for treatment or other patient management decisions.  A negative result may occur with improper specimen collection / handling, submission of specimen other than nasopharyngeal swab, presence of viral mutation(s) within the  areas targeted by this assay, and inadequate number of viral copies (<250 copies / mL). A negative result must be combined with clinical observations, patient history, and epidemiological information.  Fact Sheet for Patients:   StrictlyIdeas.no  Fact Sheet for Healthcare Providers: BankingDealers.co.za  This test is not yet approved or  cleared by the [place] FDA and has been authorized for detection and/or diagnosis of SARS-CoV-2 by FDA under an Emergency Use Authorization (EUA).  This EUA will remain in effect (meaning this test can be used) for the duration of the COVID-19 declaration under Section 564(b)(1) of the Act, 21 U.S.C. section 360bbb-3(b)(1), unless the authorization is terminated or revoked sooner.  Performed at [facility], [address], [place], [place] [address]   Urine culture     Status: Abnormal (Preliminary result)   Collection Time: 06/21/20  6:49 PM   Specimen: Urine, Random  Result Value Ref Range Status   Specimen Description   Final    URINE, RANDOM Performed at [facility], [address] [place], [place] [address]     Special Requests   Final    NONE Performed at [facility], [address] [place], [place] [address]    Culture >=100,000 COLONIES/mL GRAM NEGATIVE RODS (A)  Final   Report Status PENDING  Incomplete  SARS Coronavirus 2 by RT PCR (hospital order, performed in [place] hospital lab) Nasopharyngeal Nasopharyngeal Swab     Status: None   Collection Time: 06/21/20 11:31 PM   Specimen: Nasopharyngeal Swab  Result Value Ref Range Status   SARS Coronavirus 2 NEGATIVE NEGATIVE Final    Comment: (NOTE) SARS-CoV-2 target nucleic acids are NOT DETECTED.  The SARS-CoV-2 RNA is generally detectable in upper and lower respiratory specimens during the acute phase of infection. The lowest concentration of SARS-CoV-2 viral copies this assay can detect is 250 copies / mL. A negative result does not preclude SARS-CoV-2 infection and should not be used as the sole basis for treatment or other patient management decisions.  A negative result may occur with improper specimen collection / handling, submission of specimen other than nasopharyngeal swab, presence of viral mutation(s) within the areas targeted by this assay, and inadequate number of viral copies (<250 copies / mL). A negative result must be combined with clinical observations, patient history, and epidemiological information.  Fact Sheet for Patients:   StrictlyIdeas.no  Fact Sheet for Healthcare Providers: BankingDealers.co.za  This test is not yet approved or  cleared by the [place] FDA and has been authorized for detection and/or diagnosis of SARS-CoV-2 by FDA under an Emergency Use Authorization (EUA).  This EUA will remain in effect (meaning this test can be used) for the duration of the COVID-19 declaration under Section 564(b)(1) of the Act, 21 U.S.C. section 360bbb-3(b)(1), unless the authorization is terminated or revoked sooner.  Performed at [facility], [address] [place], [place] [address]      Labs: BNP (last 3 results) No results for input(s): BNP in the last 8760 hours. Basic Metabolic Panel: Recent Labs  Lab 06/17/20 1918 06/18/20 0608 06/19/20 0139 06/21/20 1612 06/22/20 0338  NA 133* 139 138 135 137  K 3.7 3.9 3.7 4.2 3.4*  CL 102 109 107 99 102  CO2 22 24 26 30 29   GLUCOSE 126* 94 89 110* 95  BUN 56* 35* 17 19 17   CREATININE 0.93 0.84 0.70 0.92 0.82  CALCIUM 9.1 8.5* 8.5* 9.4 8.8*  MG  --   --  1.9 1.9 2.1  PHOS  --   --  2.5  --   --    Liver Function Tests: Recent Labs  Lab 06/17/20 1918 06/18/20 0608  AST 19 13*  ALT 12 10  ALKPHOS 40 28*  BILITOT 0.6 1.2  PROT 6.3* 5.1*  ALBUMIN 3.7 3.1*   No results for input(s): LIPASE, AMYLASE in the last 168 hours. No results for input(s): AMMONIA in the last 168 hours. CBC: Recent Labs  Lab 06/18/20 0722 06/18/20 0722 06/18/20 1829 06/19/20 0139 06/21/20 1612 06/22/20 0338 06/23/20 0554  WBC 6.9  --  8.5 6.7 9.8 6.6  --   HGB 8.8*   < > 8.8* 8.1* 8.6* 8.0* 10.6*  HCT 25.9*   < > 25.6* 24.5* 25.9* 23.5* 31.7*  MCV 93.2  --  92.8 95.7 96.6 95.9  --   PLT 112*  --  103* 101* 171 148*  --    < > = values in this interval not displayed.   Cardiac Enzymes: No results for input(s): CKTOTAL, CKMB, CKMBINDEX, TROPONINI in the last 168 hours. BNP: Invalid input(s): POCBNP CBG: Recent Labs  Lab 06/17/20 1856 06/22/20 0802  GLUCAP 123* 87   D-Dimer No results for input(s): DDIMER in the last 72 hours. Hgb A1c No results for input(s): HGBA1C in the last 72 hours. Lipid Profile No results for input(s): CHOL, HDL, LDLCALC, TRIG, CHOLHDL, LDLDIRECT in the last 72 hours. Thyroid function studies Recent Labs    06/22/20 0338  TSH 2.548   Anemia work up No results for input(s): VITAMINB12, FOLATE, FERRITIN, TIBC, IRON, RETICCTPCT in the last 72 hours. Urinalysis    Component Value Date/Time   COLORURINE YELLOW (A) 06/21/2020 1849    APPEARANCEUR CLOUDY (A) 06/21/2020 1849   APPEARANCEUR Hazy (A) 02/29/2020 0927   LABSPEC 1.013 06/21/2020 1849   PHURINE 5.0 06/21/2020 1849   GLUCOSEU NEGATIVE 06/21/2020 1849   HGBUR SMALL (A) 06/21/2020 1849   BILIRUBINUR NEGATIVE 06/21/2020 1849   BILIRUBINUR Negative 02/29/2020 0927   KETONESUR NEGATIVE 06/21/2020 1849   PROTEINUR NEGATIVE 06/21/2020 1849   NITRITE NEGATIVE 06/21/2020 1849   LEUKOCYTESUR LARGE (A) 06/21/2020 1849   Sepsis Labs Invalid input(s): PROCALCITONIN,  WBC,  LACTICIDVEN Microbiology Recent Results (from the past 240 hour(s))  SARS Coronavirus 2 by RT PCR (hospital order, performed in [place] hospital lab) Nasopharyngeal Nasopharyngeal Swab     Status: None   Collection Time: 06/17/20  8:13 PM   Specimen: Nasopharyngeal Swab  Result Value Ref Range Status   SARS Coronavirus 2 NEGATIVE NEGATIVE Final    Comment: (NOTE) SARS-CoV-2 target nucleic acids are NOT DETECTED.  The SARS-CoV-2 RNA is generally detectable in upper and lower respiratory specimens during the acute phase of infection. The lowest concentration of SARS-CoV-2 viral copies this assay can detect is 250 copies / mL. A negative result does not preclude SARS-CoV-2 infection and should not be used as the sole basis for treatment or other patient management decisions.  A negative result may occur with improper specimen collection / handling, submission of specimen other than nasopharyngeal swab, presence of viral mutation(s) within the areas targeted by this assay, and inadequate number of viral copies (<250 copies / mL). A negative result must be combined with clinical observations, patient history, and epidemiological information.  Fact Sheet for Patients:   StrictlyIdeas.no  Fact Sheet for Healthcare Providers: BankingDealers.co.za  This test is not yet approved or  cleared by the [place] FDA and has been authorized  for  detection and/or diagnosis of SARS-CoV-2 by FDA under an Emergency Use Authorization (EUA).  This EUA will remain in effect (meaning this test can be used) for the duration of the COVID-19 declaration under Section 564(b)(1) of the Act, 21 U.S.C. section 360bbb-3(b)(1), unless the authorization is terminated or revoked sooner.  Performed at [facility], [address], [place], [place] [address]   Urine culture     Status: Abnormal (Preliminary result)   Collection Time: 06/21/20  6:49 PM   Specimen: Urine, Random  Result Value Ref Range Status   Specimen Description   Final    URINE, RANDOM Performed at [facility], [address] [place], [place] [address]    Special Requests   Final    NONE Performed at [facility], [address] [place], [place] [address]    Culture >=100,000 COLONIES/mL GRAM NEGATIVE RODS (A)  Final   Report Status PENDING  Incomplete  SARS Coronavirus 2 by RT PCR (hospital order, performed in [place] hospital lab) Nasopharyngeal Nasopharyngeal Swab     Status: None   Collection Time: 06/21/20 11:31 PM   Specimen: Nasopharyngeal Swab  Result Value Ref Range Status   SARS Coronavirus 2 NEGATIVE NEGATIVE Final    Comment: (NOTE) SARS-CoV-2 target nucleic acids are NOT DETECTED.  The SARS-CoV-2 RNA is generally detectable in upper and lower respiratory specimens during the acute phase of infection. The lowest concentration of SARS-CoV-2 viral copies this assay can detect is 250 copies / mL. A negative result does not preclude SARS-CoV-2 infection and should not be used as the sole basis for treatment or other patient management decisions.  A negative result may occur with improper specimen collection / handling, submission of specimen other than nasopharyngeal swab, presence of viral mutation(s) within the areas targeted by this assay, and inadequate number of viral copies (<250 copies / mL). A negative result must be  combined with clinical observations, patient history, and epidemiological information.  Fact Sheet for Patients:   StrictlyIdeas.no  Fact Sheet for Healthcare Providers: BankingDealers.co.za  This test is not yet approved or  cleared by the [place] FDA and has been authorized for detection and/or diagnosis of SARS-CoV-2 by FDA under an Emergency Use Authorization (EUA).  This EUA will remain in effect (meaning this test can be used) for the duration of the COVID-19 declaration under Section 564(b)(1) of the Act, 21 U.S.C. section 360bbb-3(b)(1), unless the authorization is terminated or revoked sooner.  Performed at [facility], [address], [place], [place] [address]      Time coordinating discharge: 32 minutes  SIGNED:   Barb Merino, MD  [facility] 06/23/2020, 11:34 AM

## 2020-06-24 LAB — URINE CULTURE: Culture: >=100000 — AB

## 2020-06-27 ENCOUNTER — Encounter: Payer: Self-pay | Admitting: Family Medicine

## 2020-06-27 ENCOUNTER — Other Ambulatory Visit: Payer: Self-pay

## 2020-06-27 ENCOUNTER — Ambulatory Visit (INDEPENDENT_AMBULATORY_CARE_PROVIDER_SITE_OTHER): Payer: Medicare Other | Admitting: Family Medicine

## 2020-06-27 VITALS — BP 90/58 | HR 113 | Temp 97.9°F | Wt 128.1 lb

## 2020-06-27 DIAGNOSIS — G8929 Other chronic pain: Secondary | ICD-10-CM

## 2020-06-27 DIAGNOSIS — R519 Headache, unspecified: Secondary | ICD-10-CM

## 2020-06-27 DIAGNOSIS — I959 Hypotension, unspecified: Secondary | ICD-10-CM | POA: Diagnosis not present

## 2020-06-27 DIAGNOSIS — K922 Gastrointestinal hemorrhage, unspecified: Secondary | ICD-10-CM

## 2020-06-27 LAB — BASIC METABOLIC PANEL
BUN: 24 mg/dL — ABNORMAL HIGH (ref 6–23)
CO2: 28 mEq/L (ref 19–32)
Calcium: 10.2 mg/dL (ref 8.4–10.5)
Chloride: 101 mEq/L (ref 96–112)
Creatinine, Ser: 1.05 mg/dL (ref 0.40–1.20)
GFR: 49.9 mL/min — ABNORMAL LOW (ref >60.00)
Glucose, Bld: 109 mg/dL — ABNORMAL HIGH (ref 70–99)
Potassium: 4.3 mEq/L (ref 3.5–5.1)
Sodium: 136 mEq/L (ref 135–145)

## 2020-06-27 LAB — CBC WITH DIFFERENTIAL/PLATELET
Basophils Absolute: 0.1 10*3/uL (ref 0.0–0.1)
Basophils Relative: 1.2 % (ref 0.0–3.0)
Eosinophils Absolute: 0.2 10*3/uL (ref 0.0–0.7)
Eosinophils Relative: 3.2 % (ref 0.0–5.0)
HCT: 36.2 % (ref 36.0–46.0)
Hemoglobin: 12.1 g/dL (ref 12.0–15.0)
Lymphocytes Relative: 25.3 % (ref 12.0–46.0)
Lymphs Abs: 1.9 10*3/uL (ref 0.7–4.0)
MCHC: 33.3 g/dL (ref 30.0–36.0)
MCV: 97.3 fl (ref 78.0–100.0)
Monocytes Absolute: 0.7 10*3/uL (ref 0.1–1.0)
Monocytes Relative: 9.1 % (ref 3.0–12.0)
Neutro Abs: 4.7 10*3/uL (ref 1.4–7.7)
Neutrophils Relative %: 61.2 % (ref 43.0–77.0)
Platelets: 251 10*3/uL (ref 150.0–400.0)
RBC: 3.72 Mil/uL — ABNORMAL LOW (ref 3.87–5.11)
RDW: 16.3 % — ABNORMAL HIGH (ref 11.5–15.5)
WBC: 7.7 10*3/uL (ref 4.0–10.5)

## 2020-06-27 NOTE — Progress Notes (Signed)
Subjective:    Patient ID: Regina Hernandez, female    DOB: 05-09-1934, 86 y.o.   MRN: [id number]  HPI Chief Complaint  Patient presents with  . Hospitalization Follow-up    This is an 86 yo female who presents today for hospital follow up.  She is accompanied by her daughter.  She has actually had two recent hospitalizations.   She was admitted 6/22 through 06/19/2020 with GI bleed.  She had a fall and loss of consciousness at home.  On presentation to the emergency room she was tachycardic and hypotensive with decreased hemoglobin.  She was given blood and started on IV Protonix.  She had been on meloxicam and taking Aleve.  She had an EGD and had a large gastric ulcer.  She was discharged home on pantoprazole 40 mg twice daily. She was readmitted to the hospital 6/26 through 06/23/2020 due to orthostatic dizziness and presyncopal episodes.  She was found to have some runs of V. tach.  Cardiology was consulted and her pacemaker was interrogated.  Echo was normal.  She was given fluids and 1 unit of blood.  She was discharged home on pantoprazole 40 mg twice daily but her daughter who manages her medications was concerned about possible side effects and has been giving her Carafate 3 times daily and pantoprazole once a day.  After walking feels SOB, lightheaded, symptoms resolved with rest. Feels ok in morning, more fatigued as day goes on.  Feels better than she did prior to admission.  She has not had any nausea, vomiting, melena.  No further falls.  She is drinking 6 to 8 glasses of water and Boost daily.  Chronic headaches on back of head. Takes Tylenol 3 tablets daily, divided dose, didn't help. Alleve gave her an ulcer.  Also on gabapentin without significant improvement.  Headaches gets worse as day goes on. Worse at night with lying on head.  Related to old TBI.  Sees neurology.   Current Outpatient Medications  Medication Instructions  . acetaminophen (TYLENOL) 1,000 mg, Oral, Every 8  hours PRN  . aspirin (ASPIRIN 81) 81 MG EC tablet 1 tablet, Oral, Daily  . cephALEXin (KEFLEX) 500 mg, Oral, 3 times daily  . Cholecalciferol (VITAMIN D3 PO) 25 mcg, Oral, Daily  . Docusate Calcium (STOOL SOFTENER PO) 1 capsule, Oral, 2 times daily  . estradiol (ESTRACE) 0.1 MG/GM vaginal cream 1 Applicatorful, Vaginal, As needed  . fluvoxaMINE (LUVOX) 50 mg, Oral, 2 times daily  . gabapentin (NEURONTIN) 400 mg, Oral, 3 times daily  . memantine (NAMENDA) 10 MG tablet TAKE 1 TABLET BY MOUTH TWICE A DAY  . pantoprazole (PROTONIX) 40 mg, Oral, 2 times daily  . primidone (MYSOLINE) 100 mg, Oral, Daily at bedtime  . risperiDONE (RISPERDAL) 1 mg, Oral, Daily at bedtime  . venlafaxine (EFFEXOR) 75 mg, Oral, Daily  . vitamin B-12 (CYANOCOBALAMIN) 1,000 mcg, Oral, Daily     Review of Systems HPI    Objective:   Physical Exam Vitals reviewed.  Constitutional:      General: She is not in acute distress.    Appearance: Normal appearance. She is normal weight. She is not ill-appearing, toxic-appearing or diaphoretic.  HENT:     Head: Normocephalic and atraumatic.  Eyes:     Conjunctiva/sclera: Conjunctivae normal.  Cardiovascular:     Rate and Rhythm: Regular rhythm. Tachycardia present.     Heart sounds: Normal heart sounds.     Comments: Heart rate on auscultation 100. Pulmonary:  Effort: Pulmonary effort is normal.     Breath sounds: Normal breath sounds.  Musculoskeletal:     Cervical back: Normal range of motion and neck supple.     Right lower leg: No edema.     Left lower leg: No edema.  Skin:    General: Skin is warm and dry.  Neurological:     General: No focal deficit present.     Mental Status: She is alert. Mental status is at baseline.  Psychiatric:        Mood and Affect: Mood normal.        Behavior: Behavior normal.        Thought Content: Thought content normal.        Judgment: Judgment normal.       BP (!) 90/58 (BP Location: Right Arm, Patient  Position: Sitting, Cuff Size: Normal)   Pulse (!) 113   Temp 97.9 F (36.6 C) (Temporal)   Wt 128 lb 1.9 oz (58.1 kg)   SpO2 99%   BMI 22.70 kg/m  Wt Readings from Last 3 Encounters:  06/27/20 128 lb 1.9 oz (58.1 kg)  06/23/20 132 lb 9.6 oz (60.1 kg)  06/18/20 132 lb (59.9 kg)       Assessment & Plan:  1. Gastrointestinal hemorrhage, unspecified gastrointestinal hemorrhage type -Instructed and provided written information regarding increasing pantoprazole to twice daily - amb ref to GI - Basic Metabolic Panel - CBC with Differential  2. Hypotension, unspecified hypotension type -Discussed importance of hydration.  Will check labs. - Basic Metabolic Panel - CBC with Differential  3. Chronic nonintractable headache, unspecified headache type -Discussed maximum acetaminophen dosage and she is well below this and may get some relief with increasing her dose.  Also discussed nonmedication interventions such as ice/heat and massage which give her some relief.  -Follow-up on file for next month  This visit occurred during the SARS-CoV-2 public health emergency.  Safety protocols were in place, including screening questions prior to the visit, additional usage of staff PPE, and extensive cleaning of exam room while observing appropriate contact time as indicated for disinfecting solutions.      Clarene Reamer, FNP-BC  [facility]  06/28/2020 10:46 AM

## 2020-06-27 NOTE — Patient Instructions (Addendum)
Please increase your pantoprazole to twice a day  Tylenol- take 750 mg tablets/ capsules- can take 2 twice a day (maximum amount 4,000 mg daily)

## 2020-07-01 ENCOUNTER — Emergency Department: Payer: Medicare Other

## 2020-07-01 ENCOUNTER — Inpatient Hospital Stay
Admission: EM | Admit: 2020-07-01 | Discharge: 2020-07-04 | DRG: 872 | Disposition: A | Discharge status: Home / Self Care | Payer: Medicare Other | Attending: Hospitalist | Admitting: Hospitalist

## 2020-07-01 ENCOUNTER — Other Ambulatory Visit: Payer: Self-pay

## 2020-07-01 DIAGNOSIS — Z66 Do not resuscitate: Secondary | ICD-10-CM | POA: Diagnosis present

## 2020-07-01 DIAGNOSIS — R195 Other fecal abnormalities: Secondary | ICD-10-CM | POA: Diagnosis present

## 2020-07-01 DIAGNOSIS — A4189 Other specified sepsis: Secondary | ICD-10-CM | POA: Diagnosis not present

## 2020-07-01 DIAGNOSIS — Z950 Presence of cardiac pacemaker: Secondary | ICD-10-CM

## 2020-07-01 DIAGNOSIS — Z20822 Contact with and (suspected) exposure to covid-19: Secondary | ICD-10-CM | POA: Diagnosis present

## 2020-07-01 DIAGNOSIS — E860 Dehydration: Secondary | ICD-10-CM | POA: Diagnosis present

## 2020-07-01 DIAGNOSIS — Z79899 Other long term (current) drug therapy: Secondary | ICD-10-CM

## 2020-07-01 DIAGNOSIS — D509 Iron deficiency anemia, unspecified: Secondary | ICD-10-CM | POA: Diagnosis present

## 2020-07-01 DIAGNOSIS — Z7982 Long term (current) use of aspirin: Secondary | ICD-10-CM

## 2020-07-01 DIAGNOSIS — R55 Syncope and collapse: Secondary | ICD-10-CM

## 2020-07-01 DIAGNOSIS — F0390 Unspecified dementia without behavioral disturbance: Secondary | ICD-10-CM | POA: Diagnosis present

## 2020-07-01 DIAGNOSIS — Z882 Allergy status to sulfonamides status: Secondary | ICD-10-CM | POA: Diagnosis not present

## 2020-07-01 DIAGNOSIS — F419 Anxiety disorder, unspecified: Secondary | ICD-10-CM | POA: Diagnosis present

## 2020-07-01 DIAGNOSIS — I248 Other forms of acute ischemic heart disease: Secondary | ICD-10-CM | POA: Diagnosis present

## 2020-07-01 DIAGNOSIS — G250 Essential tremor: Secondary | ICD-10-CM | POA: Diagnosis present

## 2020-07-01 DIAGNOSIS — D72829 Elevated white blood cell count, unspecified: Secondary | ICD-10-CM

## 2020-07-01 DIAGNOSIS — K529 Noninfective gastroenteritis and colitis, unspecified: Secondary | ICD-10-CM | POA: Diagnosis not present

## 2020-07-01 DIAGNOSIS — Z96652 Presence of left artificial knee joint: Secondary | ICD-10-CM | POA: Diagnosis present

## 2020-07-01 DIAGNOSIS — R778 Other specified abnormalities of plasma proteins: Secondary | ICD-10-CM

## 2020-07-01 DIAGNOSIS — Z800 Family history of malignant neoplasm of digestive organs: Secondary | ICD-10-CM | POA: Diagnosis not present

## 2020-07-01 DIAGNOSIS — F329 Major depressive disorder, single episode, unspecified: Secondary | ICD-10-CM | POA: Diagnosis present

## 2020-07-01 DIAGNOSIS — R6520 Severe sepsis without septic shock: Secondary | ICD-10-CM | POA: Diagnosis present

## 2020-07-01 DIAGNOSIS — A0472 Enterocolitis due to Clostridium difficile, not specified as recurrent: Secondary | ICD-10-CM | POA: Diagnosis present

## 2020-07-01 DIAGNOSIS — Z888 Allergy status to other drugs, medicaments and biological substances status: Secondary | ICD-10-CM

## 2020-07-01 DIAGNOSIS — N179 Acute kidney failure, unspecified: Secondary | ICD-10-CM | POA: Diagnosis present

## 2020-07-01 DIAGNOSIS — A419 Sepsis, unspecified organism: Secondary | ICD-10-CM

## 2020-07-01 DIAGNOSIS — I495 Sick sinus syndrome: Secondary | ICD-10-CM | POA: Diagnosis present

## 2020-07-01 DIAGNOSIS — Z8711 Personal history of peptic ulcer disease: Secondary | ICD-10-CM | POA: Diagnosis not present

## 2020-07-01 DIAGNOSIS — I951 Orthostatic hypotension: Secondary | ICD-10-CM | POA: Diagnosis present

## 2020-07-01 DIAGNOSIS — E876 Hypokalemia: Secondary | ICD-10-CM | POA: Diagnosis present

## 2020-07-01 DIAGNOSIS — N178 Other acute kidney failure: Secondary | ICD-10-CM

## 2020-07-01 DIAGNOSIS — I959 Hypotension, unspecified: Secondary | ICD-10-CM

## 2020-07-01 DIAGNOSIS — Z9049 Acquired absence of other specified parts of digestive tract: Secondary | ICD-10-CM

## 2020-07-01 DIAGNOSIS — I2510 Atherosclerotic heart disease of native coronary artery without angina pectoris: Secondary | ICD-10-CM | POA: Diagnosis present

## 2020-07-01 LAB — COMPREHENSIVE METABOLIC PANEL
ALT: 13 U/L (ref 0–44)
AST: 20 U/L (ref 15–41)
Albumin: 3.2 g/dL — ABNORMAL LOW (ref 3.5–5.0)
Alkaline Phosphatase: 44 U/L (ref 38–126)
Anion gap: 13 (ref 5–15)
BUN: 22 mg/dL (ref 8–23)
CO2: 20 mmol/L — ABNORMAL LOW (ref 22–32)
Calcium: 9 mg/dL (ref 8.9–10.3)
Chloride: 102 mmol/L (ref 98–111)
Creatinine, Ser: 1.22 mg/dL — ABNORMAL HIGH (ref 0.44–1.00)
GFR calc Af Amer: 47 mL/min — ABNORMAL LOW (ref >60)
GFR calc non Af Amer: 41 mL/min — ABNORMAL LOW (ref >60)
Glucose, Bld: 123 mg/dL — ABNORMAL HIGH (ref 70–99)
Potassium: 3.2 mmol/L — ABNORMAL LOW (ref 3.5–5.1)
Sodium: 135 mmol/L (ref 135–145)
Total Bilirubin: 0.9 mg/dL (ref 0.3–1.2)
Total Protein: 5.9 g/dL — ABNORMAL LOW (ref 6.5–8.1)

## 2020-07-01 LAB — CBC WITH DIFFERENTIAL/PLATELET
Abs Immature Granulocytes: 0.28 10*3/uL — ABNORMAL HIGH (ref 0.00–0.07)
Basophils Absolute: 0.1 10*3/uL (ref 0.0–0.1)
Basophils Relative: 0 %
Eosinophils Absolute: 0.1 10*3/uL (ref 0.0–0.5)
Eosinophils Relative: 0 %
HCT: 34.2 % — ABNORMAL LOW (ref 36.0–46.0)
Hemoglobin: 11.4 g/dL — ABNORMAL LOW (ref 12.0–15.0)
Immature Granulocytes: 2 %
Lymphocytes Relative: 3 %
Lymphs Abs: 0.6 10*3/uL — ABNORMAL LOW (ref 0.7–4.0)
MCH: 32.1 pg (ref 26.0–34.0)
MCHC: 33.3 g/dL (ref 30.0–36.0)
MCV: 96.3 fL (ref 80.0–100.0)
Monocytes Absolute: 0.9 10*3/uL (ref 0.1–1.0)
Monocytes Relative: 5 %
Neutro Abs: 16.1 10*3/uL — ABNORMAL HIGH (ref 1.7–7.7)
Neutrophils Relative %: 90 %
Platelets: 170 10*3/uL (ref 150–400)
RBC: 3.55 MIL/uL — ABNORMAL LOW (ref 3.87–5.11)
RDW: 15.8 % — ABNORMAL HIGH (ref 11.5–15.5)
WBC: 18 10*3/uL — ABNORMAL HIGH (ref 4.0–10.5)
nRBC: 0 % (ref 0.0–0.2)

## 2020-07-01 LAB — URINALYSIS, COMPLETE (UACMP) WITH MICROSCOPIC
Bacteria, UA: NONE SEEN
Bilirubin Urine: NEGATIVE
Glucose, UA: 50 mg/dL — AB
Ketones, ur: NEGATIVE mg/dL
Leukocytes,Ua: NEGATIVE
Nitrite: NEGATIVE
Protein, ur: 30 mg/dL — AB
Specific Gravity, Urine: 1.021 (ref 1.005–1.030)
Squamous Epithelial / HPF: NONE SEEN (ref 0–5)
pH: 5 (ref 5.0–8.0)

## 2020-07-01 LAB — PROTIME-INR
INR: 1.1 (ref 0.8–1.2)
Prothrombin Time: 14.2 seconds (ref 11.4–15.2)

## 2020-07-01 LAB — LACTIC ACID, PLASMA
Lactic Acid, Venous: 1.5 mmol/L (ref 0.5–1.9)
Lactic Acid, Venous: 2 mmol/L (ref 0.5–1.9)

## 2020-07-01 LAB — GLUCOSE, CAPILLARY: Glucose-Capillary: 118 mg/dL — ABNORMAL HIGH (ref 70–99)

## 2020-07-01 LAB — IRON AND TIBC
Iron: 6 ug/dL — ABNORMAL LOW (ref 28–170)
Saturation Ratios: 3 % — ABNORMAL LOW (ref 10.4–31.8)
TIBC: 220 ug/dL — ABNORMAL LOW (ref 250–450)
UIBC: 214 ug/dL

## 2020-07-01 LAB — SARS CORONAVIRUS 2 BY RT PCR (HOSPITAL ORDER, PERFORMED IN ~~LOC~~ HOSPITAL LAB): SARS Coronavirus 2: NEGATIVE

## 2020-07-01 LAB — PHOSPHORUS: Phosphorus: 3.3 mg/dL (ref 2.5–4.6)

## 2020-07-01 LAB — TROPONIN I (HIGH SENSITIVITY)
Troponin I (High Sensitivity): 97 ng/L — ABNORMAL HIGH (ref <18)
Troponin I (High Sensitivity): 92 ng/L — ABNORMAL HIGH (ref <18)

## 2020-07-01 LAB — TYPE AND SCREEN
ABO/RH(D): A POS
Antibody Screen: NEGATIVE

## 2020-07-01 LAB — PROCALCITONIN: Procalcitonin: 1.3 ng/mL

## 2020-07-01 LAB — BRAIN NATRIURETIC PEPTIDE: B Natriuretic Peptide: 332.3 pg/mL — ABNORMAL HIGH (ref 0.0–100.0)

## 2020-07-01 LAB — APTT: aPTT: 34 seconds (ref 24–36)

## 2020-07-01 LAB — VITAMIN B12: Vitamin B-12: 475 pg/mL (ref 180–914)

## 2020-07-01 MED ORDER — PANTOPRAZOLE SODIUM 40 MG PO TBEC
40.0000 mg | DELAYED_RELEASE_TABLET | Freq: Two times a day (BID) | ORAL | Status: DC
  Administered 2020-07-01 – 2020-07-04 (×6): 40 mg via ORAL
  Filled 2020-07-01 (×6): qty 1

## 2020-07-01 MED ORDER — GABAPENTIN 400 MG PO CAPS
400.0000 mg | ORAL_CAPSULE | Freq: Three times a day (TID) | ORAL | Status: DC
  Administered 2020-07-01 – 2020-07-04 (×8): 400 mg via ORAL
  Filled 2020-07-01 (×9): qty 1

## 2020-07-01 MED ORDER — METRONIDAZOLE IN NACL 5-0.79 MG/ML-% IV SOLN
500.0000 mg | Freq: Three times a day (TID) | INTRAVENOUS | Status: DC
  Administered 2020-07-01 – 2020-07-02 (×3): 500 mg via INTRAVENOUS
  Filled 2020-07-01 (×5): qty 100

## 2020-07-01 MED ORDER — SODIUM CHLORIDE 0.9 % IV SOLN: Freq: Once | INTRAVENOUS | Status: AC

## 2020-07-01 MED ORDER — ENOXAPARIN SODIUM 30 MG/0.3ML ~~LOC~~ SOLN
30.0000 mg | SUBCUTANEOUS | Status: DC
  Administered 2020-07-01: 30 mg via SUBCUTANEOUS
  Filled 2020-07-01 (×2): qty 0.3

## 2020-07-01 MED ORDER — POTASSIUM CHLORIDE 10 MEQ/100ML IV SOLN
10.0000 meq | INTRAVENOUS | Status: AC
  Administered 2020-07-01 (×2): 10 meq via INTRAVENOUS
  Filled 2020-07-01: qty 100

## 2020-07-01 MED ORDER — MEMANTINE HCL 5 MG PO TABS
10.0000 mg | ORAL_TABLET | Freq: Two times a day (BID) | ORAL | Status: DC
  Administered 2020-07-01 – 2020-07-04 (×6): 10 mg via ORAL
  Filled 2020-07-01 (×6): qty 2

## 2020-07-01 MED ORDER — ENOXAPARIN SODIUM 40 MG/0.4ML ~~LOC~~ SOLN: 40.0000 mg | SUBCUTANEOUS | Status: DC

## 2020-07-01 MED ORDER — VENLAFAXINE HCL ER 75 MG PO CP24
75.0000 mg | ORAL_CAPSULE | Freq: Every day | ORAL | Status: DC
  Administered 2020-07-02 – 2020-07-04 (×3): 75 mg via ORAL
  Filled 2020-07-01 (×4): qty 1

## 2020-07-01 MED ORDER — ACETAMINOPHEN 500 MG PO TABS: 1000.0000 mg | ORAL_TABLET | Freq: Three times a day (TID) | ORAL | Status: DC | PRN

## 2020-07-01 MED ORDER — SODIUM CHLORIDE 0.9 % IV SOLN
1.0000 g | Freq: Once | INTRAVENOUS | Status: AC
  Administered 2020-07-01: 1 g via INTRAVENOUS
  Filled 2020-07-01: qty 1

## 2020-07-01 MED ORDER — ASPIRIN EC 81 MG PO TBEC
81.0000 mg | DELAYED_RELEASE_TABLET | Freq: Every day | ORAL | Status: DC
  Administered 2020-07-01 – 2020-07-04 (×4): 81 mg via ORAL
  Filled 2020-07-01 (×4): qty 1

## 2020-07-01 MED ORDER — RISPERIDONE 1 MG PO TABS
1.0000 mg | ORAL_TABLET | Freq: Every day | ORAL | Status: DC
  Administered 2020-07-01 – 2020-07-03 (×3): 1 mg via ORAL
  Filled 2020-07-01 (×4): qty 1

## 2020-07-01 MED ORDER — SODIUM CHLORIDE 0.9 % IV BOLUS
1000.0000 mL | Freq: Once | INTRAVENOUS | Status: AC
  Administered 2020-07-01: 1000 mL via INTRAVENOUS

## 2020-07-01 MED ORDER — TOPIRAMATE 25 MG PO TABS
50.0000 mg | ORAL_TABLET | Freq: Three times a day (TID) | ORAL | Status: DC
  Administered 2020-07-01 – 2020-07-04 (×8): 50 mg via ORAL
  Filled 2020-07-01 (×12): qty 2

## 2020-07-01 MED ORDER — SODIUM CHLORIDE 0.9% FLUSH: 3.0000 mL | Freq: Once | INTRAVENOUS | Status: DC

## 2020-07-01 MED ORDER — VITAMIN B-12 1000 MCG PO TABS
1000.0000 ug | ORAL_TABLET | Freq: Every day | ORAL | Status: DC
  Administered 2020-07-02 – 2020-07-04 (×3): 1000 ug via ORAL
  Filled 2020-07-01 (×4): qty 1

## 2020-07-01 NOTE — ED Triage Notes (Addendum)
Pt comes into the ED via EMS from home with c/o near syncope today,. States she has been having issues with tachycardia and b/p dropping and has an appt with cardio ,, was seen here for the same recently, 70/40, 100/70, 572ml NS infused, #[id number], [id number], lives with daughter. Pt is a/ox4, states this episode started last night. States she has been to the hospital 3 times in the past 2 weeks for the same sx.

## 2020-07-01 NOTE — Consult Note (Signed)
CODE SEPSIS - PHARMACY COMMUNICATION  **Broad Spectrum Antibiotics should be administered within 1 hour of Sepsis diagnosis**  Time Code Sepsis Called/Page Received: 1451  Antibiotics Ordered: cefepime  Time of 1st antibiotic administration: [place] ,PharmD Clinical Pharmacist  07/01/2020  3:05 PM

## 2020-07-01 NOTE — H&P (Signed)
Regina Hernandez is an 86 y.o. female.   Chief Complaint: Generalized weakness. HPI:  Patient is 86-year-old female with history of gastric ulcer, essential tremor, anxiety, orthostatic hypotension with syncope who present to the hospital with 2-day history of weakness and dizziness.  She also had a diarrhea for the last 2 days.  Patient was recently in the hospital and discharged on 06/23/2020 with diagnosis of syncope, she also had E. coli urinary tract infection.  He was treated with 7-day course of Keflex.  She has completed antibiotics.  At the baseline, patient walks with a walker, she lives with her daughter at home.  For the last 2 days, patient could not walk anymore due to weakness.  She also has severe lightheadedness and dizziness, but did not have a fall.  She also complaining of diarrhea since yesterday.  She had a 6 loose stools yesterday and 2 loose stool today.  The stools are medium in size.  Does not have blood, does not looks black.  She does not have any abdominal pain or nausea vomiting.  She did not have any fever or chills.  She has some shortness of breath for the last 2 days, but she was able to sleep flat at nighttime, did not have any paroxysmal active dyspnea.  She has no cough.  She lost 2 pounds of weight in 2 days.  Upon arriving the emergency room, initial blood pressure was 82/63, she was given IV fluids.  Blood pressure is better afterwards.  Has a white count of 18.0, potassium 3.2, creatinine 1.22 BMP 332, troponin IX 7.  She also had lactic acid of 2.0 with a procalcitonin level of 1.3.  Chest x-ray showed some scarring, no acute changes.    Past Medical History:  Diagnosis Date  . Anxiety   . Cervical dystonia   . Essential tremor   . GI bleed   . Memory loss   . Migraine   . Occipital neuralgia   Orthostatic hypotension with syncope  Past Surgical History:  Procedure Laterality Date  . BOWEL RESECTION    . CARDIAC CATHETERIZATION  2016   no  stents/[facility]  . ESOPHAGOGASTRODUODENOSCOPY N/A 06/18/2020   Procedure: ESOPHAGOGASTRODUODENOSCOPY (EGD);  Surgeon: Toledo, Teodoro K, MD;  Location: [facility] ENDOSCOPY;  Service: Gastroenterology;  Laterality: N/A;  . PACEMAKER IMPLANT    . REPLACEMENT TOTAL KNEE Left     Family History  Problem Relation Age of Onset  . Other Mother        "old age"  . Ulcers Father   . Stomach cancer Brother    Social History:  reports that she has never smoked. She has never used smokeless tobacco. She reports previous alcohol use. She reports that she does not use drugs.  Allergies:  Allergies  Allergen Reactions  . Adhesive [Tape] Other (See Comments)    Contact dermatitis  . Baclofen Rash  . Lyrica [Pregabalin] Other (See Comments)    Unable to remember reaction.  . Sulfa Antibiotics Rash    (Not in a hospital admission)   Results for orders placed or performed during the hospital encounter of 07/01/20 (from the past 48 hour(s))  Troponin I (High Sensitivity)     Status: Abnormal   Collection Time: 07/01/20 10:40 AM  Result Value Ref Range   Troponin I (High Sensitivity) 97 (H) <18 ng/L    Comment: (NOTE) Elevated high sensitivity troponin I (hsTnI) values and significant  changes across serial measurements may suggest ACS but many   other  chronic and acute conditions are known to elevate hsTnI results.  Refer to the "Links" section for chest pain algorithms and additional  guidance. Performed at [facility], [address] [place], [place] [address]   CBC with Differential     Status: Abnormal   Collection Time: 07/01/20 10:40 AM  Result Value Ref Range   WBC 18.0 (H) 4.0 - 10.5 K/uL   RBC 3.55 (L) 3.87 - 5.11 MIL/uL   Hemoglobin 11.4 (L) 12.0 - 15.0 g/dL   HCT 34.2 (L) 36 - 46 %   MCV 96.3 80.0 - 100.0 fL   MCH 32.1 26.0 - 34.0 pg   MCHC 33.3 30.0 - 36.0 g/dL   RDW 15.8 (H) 11.5 - 15.5 %   Platelets 170 150 - 400 K/uL   nRBC 0.0 0.0 - 0.2 %    Neutrophils Relative % 90 %   Neutro Abs 16.1 (H) 1.7 - 7.7 K/uL   Lymphocytes Relative 3 %   Lymphs Abs 0.6 (L) 0.7 - 4.0 K/uL   Monocytes Relative 5 %   Monocytes Absolute 0.9 0 - 1 K/uL   Eosinophils Relative 0 %   Eosinophils Absolute 0.1 0 - 0 K/uL   Basophils Relative 0 %   Basophils Absolute 0.1 0 - 0 K/uL   Immature Granulocytes 2 %   Abs Immature Granulocytes 0.28 (H) 0.00 - 0.07 K/uL    Comment: Performed at [facility], [address] [place], [place] [address]  Brain natriuretic peptide     Status: Abnormal   Collection Time: 07/01/20 10:40 AM  Result Value Ref Range   B Natriuretic Peptide 332.3 (H) 0.0 - 100.0 pg/mL    Comment: Performed at [facility], [address] [place], [place] [address]  Comprehensive metabolic panel     Status: Abnormal   Collection Time: 07/01/20 10:40 AM  Result Value Ref Range   Sodium 135 135 - 145 mmol/L   Potassium 3.2 (L) 3.5 - 5.1 mmol/L   Chloride 102 98 - 111 mmol/L   CO2 20 (L) 22 - 32 mmol/L   Glucose, Bld 123 (H) 70 - 99 mg/dL    Comment: Glucose reference range applies only to samples taken after fasting for at least 8 hours.   BUN 22 8 - 23 mg/dL   Creatinine, Ser 1.22 (H) 0.44 - 1.00 mg/dL   Calcium 9.0 8.9 - 10.3 mg/dL   Total Protein 5.9 (L) 6.5 - 8.1 g/dL   Albumin 3.2 (L) 3.5 - 5.0 g/dL   AST 20 15 - 41 U/L   ALT 13 0 - 44 U/L   Alkaline Phosphatase 44 38 - 126 U/L   Total Bilirubin 0.9 0.3 - 1.2 mg/dL   GFR calc non Af Amer 41 (L) >60 mL/min   GFR calc Af Amer 47 (L) >60 mL/min   Anion gap 13 5 - 15    Comment: Performed at [facility], [address] [place], [place] [address]  Type and screen [place]     Status: None   Collection Time: 07/01/20 10:40 AM  Result Value Ref Range   ABO/RH(D) A POS    Antibody Screen NEG    Sample Expiration      07/04/2020,2359 Performed at [facility], [address] [place], [place] [address]    Glucose, capillary     Status: Abnormal   Collection Time: 07/01/20 10:46 AM  Result Value Ref Range  Glucose-Capillary 118 (H) 70 - 99 mg/dL    Comment: Glucose reference range applies only to samples taken after fasting for at least 8 hours.  Urinalysis, Complete w Microscopic     Status: Abnormal   Collection Time: 07/01/20 11:46 AM  Result Value Ref Range   Color, Urine YELLOW (A) YELLOW   APPearance HAZY (A) CLEAR   Specific Gravity, Urine 1.021 1.005 - 1.030   pH 5.0 5.0 - 8.0   Glucose, UA 50 (A) NEGATIVE mg/dL   Hgb urine dipstick SMALL (A) NEGATIVE   Bilirubin Urine NEGATIVE NEGATIVE   Ketones, ur NEGATIVE NEGATIVE mg/dL   Protein, ur 30 (A) NEGATIVE mg/dL   Nitrite NEGATIVE NEGATIVE   Leukocytes,Ua NEGATIVE NEGATIVE   RBC / HPF 0-5 0 - 5 RBC/hpf   WBC, UA 0-5 0 - 5 WBC/hpf   Bacteria, UA NONE SEEN NONE SEEN   Squamous Epithelial / LPF NONE SEEN 0 - 5   Mucus PRESENT     Comment: Performed at [facility], [address] [place], [place] [address]  Troponin I (High Sensitivity)     Status: Abnormal   Collection Time: 07/01/20 11:46 AM  Result Value Ref Range   Troponin I (High Sensitivity) 92 (H) <18 ng/L    Comment: (NOTE) Elevated high sensitivity troponin I (hsTnI) values and significant  changes across serial measurements may suggest ACS but many other  chronic and acute conditions are known to elevate hsTnI results.  Refer to the "Links" section for chest pain algorithms and additional  guidance. Performed at [facility], [address] [place], [place] [address]   Lactic acid, plasma     Status: None   Collection Time: 07/01/20 12:00 PM  Result Value Ref Range   Lactic Acid, Venous 1.5 0.5 - 1.9 mmol/L    Comment: Performed at [facility], [address] [place], [place] [address]  Lactic acid, plasma     Status: Abnormal   Collection Time: 07/01/20  1:45 PM  Result Value Ref Range   Lactic Acid, Venous 2.0 (HH) 0.5 -  1.9 mmol/L    Comment: CRITICAL RESULT CALLED TO, READ BACK BY AND VERIFIED WITH KATIE FERUSSON RN AT 1427 ON 07/01/20 SNG Performed at [facility], [address] [place], [place] [address]   Procalcitonin - Baseline     Status: None   Collection Time: 07/01/20  1:45 PM  Result Value Ref Range   Procalcitonin 1.30 ng/mL    Comment:        Interpretation: PCT > 0.5 ng/mL and <= 2 ng/mL: Systemic infection (sepsis) is possible, but other conditions are known to elevate PCT as well. (NOTE)       Sepsis PCT Algorithm           Lower Respiratory Tract                                      Infection PCT Algorithm    ----------------------------     ----------------------------         PCT < 0.25 ng/mL                PCT < 0.10 ng/mL          Strongly encourage             Strongly discourage   discontinuation of antibiotics    initiation of antibiotics    ----------------------------     -----------------------------         PCT 0.25 - 0.50 ng/mL            PCT 0.10 - 0.25 ng/mL               OR       >80% decrease in PCT            Discourage initiation of                                            antibiotics      Encourage discontinuation           of antibiotics    ----------------------------     -----------------------------         PCT >= 0.50 ng/mL              PCT 0.26 - 0.50 ng/mL                AND       <80% decrease in PCT             Encourage initiation of                                             antibiotics       Encourage continuation           of antibiotics    ----------------------------     -----------------------------        PCT >= 0.50 ng/mL                  PCT > 0.50 ng/mL               AND         increase in PCT                  Strongly encourage                                      initiation of antibiotics    Strongly encourage escalation           of antibiotics                                     -----------------------------                                            PCT <= 0.25 ng/mL                                                 OR                                        > 80% decrease in PCT  Discontinue / Do not initiate                                             antibiotics  Performed at [facility], [address] [place], [place] [address]   APTT     Status: None   Collection Time: 07/01/20  3:24 PM  Result Value Ref Range   aPTT 34 24 - 36 seconds    Comment: Performed at [facility], [address] [place], [place] [address]  Protime-INR     Status: None   Collection Time: 07/01/20  3:24 PM  Result Value Ref Range   Prothrombin Time 14.2 11.4 - 15.2 seconds   INR 1.1 0.8 - 1.2    Comment: (NOTE) INR goal varies based on device and disease states. Performed at [facility], [address] [place], [place] [address]    DG Chest Port 1 View  Result Date: 07/01/2020 CLINICAL DATA:  Tachycardia and hypotension EXAM: PORTABLE CHEST 1 VIEW COMPARISON:  June 22, 2020 FINDINGS: There is mild scarring in the right base. The lungs elsewhere are clear. Heart is upper normal in size with pulmonary vascularity normal. Pacemaker leads are attached to the right atrium and right ventricle. No adenopathy. No bone lesions. IMPRESSION: Mild scarring right base. No edema or airspace opacity. Heart upper normal in size. Pacemaker leads attached to right atrium and right ventricle. No adenopathy. Electronically Signed   By: William  Woodruff III M.D.   On: 07/01/2020 14:55    Review of Systems  Constitutional: Positive for diaphoresis, fatigue and unexpected weight change. Negative for chills and fever.  HENT: Negative for congestion, nosebleeds and postnasal drip.   Eyes: Negative for discharge and itching.  Respiratory: Positive for shortness of breath. Negative for cough and wheezing.   Cardiovascular: Negative for chest pain, palpitations and leg  swelling.  Gastrointestinal: Positive for diarrhea. Negative for abdominal pain, blood in stool, constipation, nausea and vomiting.  Genitourinary: Negative for difficulty urinating, dysuria, frequency and hematuria.  Musculoskeletal: Negative for joint swelling and myalgias.  Allergic/Immunologic: Negative for environmental allergies and food allergies.  Neurological: Positive for dizziness and light-headedness. Negative for syncope and speech difficulty.  Psychiatric/Behavioral: Negative for confusion and hallucinations.    Blood pressure 105/68, pulse 76, temperature 97.8 F (36.6 C), temperature source Oral, resp. rate 12, height 5' 3" (1.6 m), weight 58.1 kg, SpO2 96 %. Physical Exam Constitutional:      General: She is not in acute distress.    Appearance: She is not ill-appearing or toxic-appearing.  HENT:     Head: Normocephalic and atraumatic.     Nose: No congestion.     Mouth/Throat:     Mouth: Mucous membranes are moist.     Pharynx: Oropharynx is clear. No oropharyngeal exudate.  Eyes:     Extraocular Movements: Extraocular movements intact.     Conjunctiva/sclera: Conjunctivae normal.     Pupils: Pupils are equal, round, and reactive to light.  Cardiovascular:     Rate and Rhythm: Normal rate and regular rhythm.     Heart sounds: No murmur heard.  No gallop.   Pulmonary:     Effort: Pulmonary effort is normal. No respiratory distress.     Breath sounds: No wheezing or rales.  Abdominal:     General: Abdomen is flat. There is no distension.       Palpations: Abdomen is soft.     Tenderness: There is no abdominal tenderness.  Musculoskeletal:        General: Normal range of motion.     Cervical back: Normal range of motion and neck supple. No rigidity or tenderness.     Right lower leg: Edema present.     Left lower leg: Edema present.  Lymphadenopathy:     Cervical: No cervical adenopathy.  Skin:    General: Skin is warm and dry.     Coloration: Skin is not  jaundiced.  Neurological:     General: No focal deficit present.     Mental Status: She is alert and oriented to person, place, and time.     Cranial Nerves: No cranial nerve deficit.  Psychiatric:        Mood and Affect: Mood normal.        Thought Content: Thought content normal.      Assessment/Plan #1.  Sepsis.  Likely secondary to colitis. Patient met sepsis criteria with leukocytosis, tachycardia, acute kidney injury and mild hypotension.  Lactic acid level 2.0. Patient has received IV fluid in the emergency room.  She has mild elevation of BNP at 300 range, I will hold additional IV fluids.  #2.  Likely bacterial colitis.  Possibility with C. difficile colitis. Patient was on antibiotics and discontinued recently.  She has high risk for C. difficile colitis. C. difficile toxin and enteric pathogen were sent out.  Pending results.  She also had mild elevation of procalcitonin level at 1.30.  I will start IV Flagyl until patient test results come back.  Recheck procalcitonin level tomorrow to make sure that antibiotics effective.  #3.  Acute kidney injury. Likely secondary to dehydration.  Patient already received IV fluid in emergency room, I will hold additional IV fluids due to slight elevation of BMP.  Chest x-ray does not show any volume overload.  4.  Chronic orthostatic hypotension. I will check cortisol level.  Consider midodrine if blood pressure still low.  5.  Anemia. Patient has positive occult blood in the stool, she also has history of peptic ulcer disease.  Continue Protonix 40 mg twice a day.  Occult blood could be secondary to colitis.  We will also check a B12 and iron level.  6.  Hypokalemia. Supplement.  Recheck potassium level and magnesium tomorrow.  7.  Dementia.  Continue home medicines.  8.  Elevated troponin.   Peaked at 97, most likely due to low blood pressure.  Follow.      , MD 07/01/2020, 4:22 PM   

## 2020-07-01 NOTE — ED Notes (Signed)
DNR band placed on right wrist.

## 2020-07-01 NOTE — ED Provider Notes (Signed)
[facility] Emergency Department Provider Note   ____________________________________________   First MD Initiated Contact with Patient 07/01/20 1009     (approximate)  I have reviewed the triage vital signs and the nursing notes.   HISTORY  Chief Complaint Near Syncope    HPI Regina Hernandez is a 86 y.o. female who reports she is getting lightheaded.  This happens when she sits or stands.  She had near syncope today.  She has had episodes of her blood pressure dropping recently and had an endoscopy done which showed an upper GI ulcer.  She had a low blood count and required transfusion recently.  She also has known sick sinus syndrome and had a pacer put in.  She was in the hospital twice toward the end of lacks month on the 22nd and 26th requiring transfusion.  Patient reports she had 6 bowel movements yesterday but they were brown and not diarrheal.        Past Medical History:  Diagnosis Date  . Anxiety   . Cervical dystonia   . Essential tremor   . GI bleed   . Memory loss   . Migraine   . Occipital neuralgia     Patient Active Problem List   Diagnosis Date Noted  . Ventricular tachycardia ([place]) 06/22/2020  . Syncopal episodes 06/21/2020  . Orthostatic hypotension 06/21/2020  . Acute GI bleeding 06/18/2020  . Melena 06/17/2020  . Essential tremor   . Arachnoid cyst 02/11/2020  . Chronic headaches 02/11/2020  . Coccydynia 02/11/2020  . H/O staphylococcal infection 02/11/2020  . Migraine 02/11/2020  . Vertigo 02/11/2020  . Memory loss 02/04/2020  . Gait abnormality 02/04/2020  . Depression 02/04/2020  . Confusion 02/04/2020  . Major depression with psychotic features ([place]) 10/31/2019  . History of suicide attempt 10/30/2019  . Iron deficiency anemia 05/22/2019  . Status post left knee replacement 11/20/2018  . Pain of cervical facet joint 07/13/2018  . Arthritis 06/01/2018  . Tremor 06/01/2018  . Spondylosis without myelopathy or  radiculopathy, cervical region 01/19/2017  . Diverticulosis of large intestine without hemorrhage 11/30/2016  . Stage 3 chronic kidney disease 11/30/2016  . Plantar fasciitis of left foot 09/29/2016  . Left knee pain 11/26/2014  . Polyp of colon 11/01/2013  . Hypertension 03/01/2012  . Pacemaker 01/26/2012  . History of endocarditis 09/15/2011  . Coronary atherosclerosis 08/17/2011  . Sick sinus syndrome ([place]) 08/17/2011  . Barrett's esophagus without dysplasia 07/20/2010  . Microscopic hematuria 07/20/2010  . Recurrent UTI 07/20/2010  . Vestibulitis, vulvar 07/20/2010    Past Surgical History:  Procedure Laterality Date  . BOWEL RESECTION    . CARDIAC CATHETERIZATION  2016   no stents/[facility]  . ESOPHAGOGASTRODUODENOSCOPY N/A 06/18/2020   Procedure: ESOPHAGOGASTRODUODENOSCOPY (EGD);  Surgeon: Toledo, Benay Pike, MD;  Location: [facility] ENDOSCOPY;  Service: Gastroenterology;  Laterality: N/A;  . PACEMAKER IMPLANT    . REPLACEMENT TOTAL KNEE Left     Prior to Admission medications   Medication Sig Start Date End Date Taking? Authorizing Provider  acetaminophen (TYLENOL) 500 MG tablet Take 2 tablets (1,000 mg total) by mouth every 8 (eight) hours as needed for mild pain or moderate pain. 06/19/20  Yes Barb Merino, MD  aspirin (ASPIRIN 81) 81 MG EC tablet Take 1 tablet by mouth daily.    Yes [provider]  Cholecalciferol (VITAMIN D3 PO) Take 25 mcg by mouth daily.   Yes [provider]  Docusate Calcium (STOOL SOFTENER PO) Take  1 capsule by mouth in the morning and at bedtime.    Yes [provider]  estradiol (ESTRACE) 0.1 MG/GM vaginal cream Place 1 Applicatorful vaginally as needed.   Yes [provider]  fluvoxaMINE (LUVOX) 50 MG tablet Take 50 mg by mouth 2 (two) times daily.   Yes [provider]  gabapentin (NEURONTIN) 400 MG capsule Take 1 capsule (400 mg total) by mouth 3 (three) times daily. 04/22/20  Yes Suzzanne Cloud, NP  memantine (NAMENDA) 10 MG tablet TAKE 1 TABLET BY MOUTH TWICE A DAY Patient taking differently: Take 10 mg by mouth 2 (two) times daily.  02/26/20  Yes Marcial Pacas, MD  pantoprazole (PROTONIX) 40 MG tablet Take 1 tablet (40 mg total) by mouth 2 (two) times daily. 06/19/20 07/19/20 Yes Barb Merino, MD  primidone (MYSOLINE) 50 MG tablet Take 2 tablets (100 mg total) by mouth at bedtime. 04/22/20  Yes Suzzanne Cloud, NP  risperiDONE (RISPERDAL) 2 MG tablet Take 1 mg by mouth at bedtime.    Yes [provider]  topiramate (TOPAMAX) 50 MG tablet Take 50 mg by mouth 3 (three) times daily. 05/12/20  Yes [provider]  venlafaxine XR (EFFEXOR-XR) 75 MG 24 hr capsule Take 75 mg by mouth daily. 06/28/20  Yes [provider]  vitamin B-12 (CYANOCOBALAMIN) 1000 MCG tablet Take 1,000 mcg by mouth daily.   Yes [provider]    Allergies Adhesive [tape], Baclofen, Lyrica [pregabalin], and Sulfa antibiotics  Family History  Problem Relation Age of Onset  . Other Mother        "old age"  . Ulcers Father   . Stomach cancer Brother     Social History Social History   Tobacco Use  . Smoking status: Never Smoker  . Smokeless tobacco: Never Used  Vaping Use  . Vaping Use: Never used  Substance Use Topics  . Alcohol use: Not Currently  . Drug use: Never    Review of Systems  Constitutional: No fever/chills Eyes: No visual changes. ENT: No sore throat. Cardiovascular: Denies chest pain. Respiratory: Denies shortness of breath. Gastrointestinal: No abdominal pain.  No nausea, no vomiting.  No diarrhea.  No constipation. Genitourinary: Negative for dysuria. Musculoskeletal: Negative for back pain. Skin: Negative for rash. Neurological: Negative for headaches, focal weakness   ____________________________________________   PHYSICAL EXAM:  VITAL SIGNS: ED Triage Vitals  Enc Vitals Group     BP 07/01/20 0950 (!) 82/63     Pulse Rate 07/01/20  0950 98     Resp 07/01/20 0950 18     Temp 07/01/20 0950 97.8 F (36.6 C)     Temp Source 07/01/20 0950 Oral     SpO2 07/01/20 0950 97 %     Weight 07/01/20 0951 128 lb 1.9 oz (58.1 kg)     Height 07/01/20 0951 5\' 3"  (1.6 m)     Head Circumference --      Peak Flow --      Pain Score 07/01/20 0951 0     Pain Loc --      Pain Edu? --      Excl. in [place]? --     Constitutional: Alert and oriented. Well appearing and in no acute distress. Eyes: Conjunctivae are normal. PERRL. Head: Atraumatic. Nose: No congestion/rhinnorhea. Mouth/Throat: Mucous membranes are moist.  Oropharynx non-erythematous. Neck: No stridor.   Cardiovascular: Normal rate, regular rhythm. Grossly normal heart sounds.  Good peripheral circulation. Respiratory: Normal respiratory effort.  No  retractions. Lungs CTAB. Gastrointestinal: Soft and nontender. No distention. No abdominal bruits. No CVA tenderness. Musculoskeletal: No lower extremity tenderness nor edema.  Rectal exam: Patient has liquid brown stool.  This is Hemoccult positive.  There are no masses or tenderness. Neurologic:  Normal speech and language. No gross focal neurologic deficits are appreciated. No gait instability.  Patient reports her legs are weak but there is no objective weakness on localized testing.  She says her legs are weak and she needs help to stand.  Motor strength is 5/5 throughout patient does not report any numbness cranial nerves II through XII are intact although visual fields were not checked and cerebellar rapid alternating movements are normal and finger-to-nose is normal. Skin:  Skin is warm, dry and intact. No rash noted. Psychiatric: Mood and affect are normal. Speech and behavior are normal.  ____________________________________________   LABS (all labs ordered are listed, but only abnormal results are displayed)  Labs Reviewed  URINALYSIS, COMPLETE (UACMP) WITH MICROSCOPIC - Abnormal; Notable for the following components:        Result Value   Color, Urine YELLOW (*)    APPearance HAZY (*)    Glucose, UA 50 (*)    Hgb urine dipstick SMALL (*)    Protein, ur 30 (*)    All other components within normal limits  CBC WITH DIFFERENTIAL/PLATELET - Abnormal; Notable for the following components:   WBC 18.0 (*)    RBC 3.55 (*)    Hemoglobin 11.4 (*)    HCT 34.2 (*)    RDW 15.8 (*)    Neutro Abs 16.1 (*)    Lymphs Abs 0.6 (*)    Abs Immature Granulocytes 0.28 (*)    All other components within normal limits  BRAIN NATRIURETIC PEPTIDE - Abnormal; Notable for the following components:   B Natriuretic Peptide 332.3 (*)    All other components within normal limits  COMPREHENSIVE METABOLIC PANEL - Abnormal; Notable for the following components:   Potassium 3.2 (*)    CO2 20 (*)    Glucose, Bld 123 (*)    Creatinine, Ser 1.22 (*)    Total Protein 5.9 (*)    Albumin 3.2 (*)    GFR calc non Af Amer 41 (*)    GFR calc Af Amer 47 (*)    All other components within normal limits  GLUCOSE, CAPILLARY - Abnormal; Notable for the following components:   Glucose-Capillary 118 (*)    All other components within normal limits  LACTIC ACID, PLASMA - Abnormal; Notable for the following components:   Lactic Acid, Venous 2.0 (*)    All other components within normal limits  TROPONIN I (HIGH SENSITIVITY) - Abnormal; Notable for the following components:   Troponin I (High Sensitivity) 97 (*)    All other components within normal limits  TROPONIN I (HIGH SENSITIVITY) - Abnormal; Notable for the following components:   Troponin I (High Sensitivity) 92 (*)    All other components within normal limits  CULTURE, BLOOD (ROUTINE X 2)  CULTURE, BLOOD (ROUTINE X 2)  URINE CULTURE  GASTROINTESTINAL PANEL BY PCR, STOOL (REPLACES STOOL CULTURE)  C DIFFICILE QUICK SCREEN W PCR REFLEX  LACTIC ACID, PLASMA  PROCALCITONIN  APTT  PROTIME-INR  URINALYSIS, ROUTINE W REFLEX MICROSCOPIC  CBG MONITORING, ED  TYPE AND SCREEN    ____________________________________________  EKG  EKG read interpreted by me shows normal sinus rhythm rate of 100 normal axis occasional PACs no acute ST-T wave changes ____________________________________________  RADIOLOGY  ED MD interpretation:   Official radiology report(s): DG Chest Port 1 View  Result Date: 07/01/2020 CLINICAL DATA:  Tachycardia and hypotension EXAM: PORTABLE CHEST 1 VIEW COMPARISON:  June 22, 2020 FINDINGS: There is mild scarring in the right base. The lungs elsewhere are clear. Heart is upper normal in size with pulmonary vascularity normal. Pacemaker leads are attached to the right atrium and right ventricle. No adenopathy. No bone lesions. IMPRESSION: Mild scarring right base. No edema or airspace opacity. Heart upper normal in size. Pacemaker leads attached to right atrium and right ventricle. No adenopathy. Electronically Signed   By: Lowella Grip III M.D.   On: 07/01/2020 14:55    ____________________________________________   PROCEDURES  Procedure(s) performed (including Critical Care):  Procedures   ____________________________________________   INITIAL IMPRESSION / ASSESSMENT AND PLAN / ED COURSE  Patient's blood pressure goes up with fluids and but is now trending down slightly.  Her white count is high I am not sure why.  White chest x-ray is normal urinalysis is clear her lactic acid is actually climbing.  It may be due to a GI infection.  I have given her Maxipime but am discussing her with the hospitalist and will likely add or the hospitalist will add Cipro and Flagyl.  Patient's procalcitonin is also high.   And her GFR is considerably lower than it had been.          ____________________________________________   FINAL CLINICAL IMPRESSION(S) / ED DIAGNOSES  Final diagnoses:  Near syncope  Hypotension, unspecified hypotension type  Leukocytosis, unspecified type  Elevated troponin     ED Discharge Orders    None        Note:  This document was prepared using Dragon voice recognition software and may include unintentional dictation errors.    Nena Polio, MD 07/01/20 [phone]

## 2020-07-02 ENCOUNTER — Telehealth: Payer: Self-pay

## 2020-07-02 LAB — BASIC METABOLIC PANEL
Anion gap: 5 (ref 5–15)
BUN: 18 mg/dL (ref 8–23)
CO2: 24 mmol/L (ref 22–32)
Calcium: 8.7 mg/dL — ABNORMAL LOW (ref 8.9–10.3)
Chloride: 110 mmol/L (ref 98–111)
Creatinine, Ser: 0.83 mg/dL (ref 0.44–1.00)
GFR calc Af Amer: >60 mL/min (ref >60)
GFR calc non Af Amer: >60 mL/min (ref >60)
Glucose, Bld: 88 mg/dL (ref 70–99)
Potassium: 3.1 mmol/L — ABNORMAL LOW (ref 3.5–5.1)
Sodium: 139 mmol/L (ref 135–145)

## 2020-07-02 LAB — GASTROINTESTINAL PANEL BY PCR, STOOL (REPLACES STOOL CULTURE)

## 2020-07-02 LAB — CBC
HCT: 27.8 % — ABNORMAL LOW (ref 36.0–46.0)
Hemoglobin: 9.6 g/dL — ABNORMAL LOW (ref 12.0–15.0)
MCH: 32.9 pg (ref 26.0–34.0)
MCHC: 34.5 g/dL (ref 30.0–36.0)
MCV: 95.2 fL (ref 80.0–100.0)
Platelets: 135 10*3/uL — ABNORMAL LOW (ref 150–400)
RBC: 2.92 MIL/uL — ABNORMAL LOW (ref 3.87–5.11)
RDW: 15.8 % — ABNORMAL HIGH (ref 11.5–15.5)
WBC: 11 10*3/uL — ABNORMAL HIGH (ref 4.0–10.5)
nRBC: 0 % (ref 0.0–0.2)

## 2020-07-02 LAB — C DIFFICILE QUICK SCREEN W PCR REFLEX
C Diff antigen: POSITIVE — AB
C Diff toxin: NEGATIVE

## 2020-07-02 LAB — CLOSTRIDIUM DIFFICILE BY PCR, REFLEXED: Toxigenic C. Difficile by PCR: POSITIVE — AB

## 2020-07-02 LAB — PROCALCITONIN: Procalcitonin: 1.18 ng/mL

## 2020-07-02 LAB — CORTISOL: Cortisol, Plasma: 17.1 ug/dL

## 2020-07-02 LAB — MAGNESIUM: Magnesium: 1.8 mg/dL (ref 1.7–2.4)

## 2020-07-02 MED ORDER — ENOXAPARIN SODIUM 40 MG/0.4ML ~~LOC~~ SOLN
40.0000 mg | SUBCUTANEOUS | Status: DC
  Administered 2020-07-02 – 2020-07-03 (×2): 40 mg via SUBCUTANEOUS
  Filled 2020-07-02 (×2): qty 0.4

## 2020-07-02 MED ORDER — SODIUM CHLORIDE 0.9 % IV SOLN
INTRAVENOUS | Status: DC | PRN
  Administered 2020-07-02: 250 mL via INTRAVENOUS

## 2020-07-02 MED ORDER — PRIMIDONE 50 MG PO TABS
100.0000 mg | ORAL_TABLET | Freq: Every day | ORAL | Status: DC
  Administered 2020-07-02 – 2020-07-03 (×2): 100 mg via ORAL
  Filled 2020-07-02 (×3): qty 2

## 2020-07-02 MED ORDER — MAGNESIUM SULFATE IN D5W 1-5 GM/100ML-% IV SOLN
1.0000 g | Freq: Once | INTRAVENOUS | Status: AC
  Administered 2020-07-02: 1 g via INTRAVENOUS
  Filled 2020-07-02: qty 100

## 2020-07-02 MED ORDER — FLUVOXAMINE MALEATE 50 MG PO TABS
50.0000 mg | ORAL_TABLET | Freq: Two times a day (BID) | ORAL | Status: DC
  Administered 2020-07-02 – 2020-07-04 (×3): 50 mg via ORAL
  Filled 2020-07-02 (×7): qty 1

## 2020-07-02 MED ORDER — POTASSIUM CHLORIDE CRYS ER 20 MEQ PO TBCR
40.0000 meq | EXTENDED_RELEASE_TABLET | Freq: Once | ORAL | Status: AC
  Administered 2020-07-02: 40 meq via ORAL
  Filled 2020-07-02: qty 2

## 2020-07-02 MED ORDER — VANCOMYCIN 50 MG/ML ORAL SOLUTION
125.0000 mg | Freq: Four times a day (QID) | ORAL | Status: DC
  Administered 2020-07-02 – 2020-07-04 (×6): 125 mg via ORAL
  Filled 2020-07-02 (×9): qty 2.5

## 2020-07-02 NOTE — Progress Notes (Signed)
PHARMACIST - PHYSICIAN COMMUNICATION  CONCERNING:  Enoxaparin (Lovenox) for DVT Prophylaxis    RECOMMENDATION: Patient was prescribed enoxaprin 30mg  q24 hours for VTE prophylaxis.   Filed Weights   07/01/20 0951  Weight: 58.1 kg (128 lb 1.9 oz)    Body mass index is 22.7 kg/m.  Estimated Creatinine Clearance: 41.7 mL/min (by C-G formula based on SCr of 0.83 mg/dL).  Patient is candidate for enoxaparin 40mg  every 24 hours based on CrCl >84ml/min AND Weight > 45kg for women   DESCRIPTION: Pharmacy has adjusted enoxaparin dose per [facility] policy.  Patient is now receiving enoxaparin 40mg  every 24 hours.   Lu Duffel, PharmD, BCPS Clinical Pharmacist 07/02/2020 9:06 AM

## 2020-07-02 NOTE — Telephone Encounter (Signed)
Patient daughter called to let us know that patient is in the hospital and cannot send her remote transmission until she gets back home

## 2020-07-02 NOTE — Progress Notes (Signed)
PROGRESS NOTE    Regina Hernandez  LNZ:[id number] DOB: April 04, 1934 DOA: 07/01/2020 PCP: Elby Beck, FNP    Assessment & Plan:   Active Problems:   Acute colitis   AKI (acute kidney injury) ([place])   Sepsis [facility])    Patient is 86 year old female with history of gastric ulcer, essential tremor, anxiety, orthostatic hypotension with syncope who present to the hospital with 2-day history of weakness and dizziness.  She also had a diarrhea for the last 2 days.   #1.  Sepsis.  Likely secondary to colitis. Patient met sepsis criteria with leukocytosis, tachycardia, acute kidney injury and mild hypotension.  Lactic acid level 2.0. Patient has received IV fluid in the emergency room.    #2.  Likely C diff colitis, POA Patient was on antibiotics and discontinued recently.   --C diff antigen pos, and PCR pos, so given systemic illness and continued diarrhea, will treat for C diff. --start oral vanc  #3.  Acute kidney injury. Likely secondary to dehydration.  Patient already received IV fluid in emergency room, I will hold additional IV fluids due to slight elevation of BMP.  Chest x-ray does not show any volume overload.  4.  Chronic orthostatic hypotension. I will check cortisol level.  Consider midodrine if blood pressure still low.  5.  Anemia. Patient has positive occult blood in the stool, she also has history of peptic ulcer disease.  Continue Protonix 40 mg twice a day.  Occult blood could be secondary to colitis.  We will also check a B12 and iron level.  6.  Hypokalemia. Supplement.  Recheck potassium level and magnesium tomorrow.  7.  Dementia.  Continue home medicines.  8.  Elevated troponin.   Peaked at 48, most likely due to low blood pressure.  Follow.    DVT prophylaxis: Lovenox SQ Code Status: DNR  Family Communication:  Status is: inpatient Dispo:   The patient is from: home Anticipated d/c is to: undetermined Anticipated d/c date is: 2-3  days Patient currently is not medically stable to d/c due to: started on C diff treatment, will need to see improvement of diarrhea and AKI before discharge.   Subjective and Interval History:  Pt reported continued diarrhea.  No fever, N/V.  Able to eat.  No chest pain.   Objective: Vitals:   07/01/20 2135 07/02/20 0442 07/02/20 1324 07/02/20 2141  BP: 128/81 126/77 116/78 127/76  Pulse: 84 71 78 66  Resp:  _0 Temp: 99.4 F (37.4 C) 98.5 F (36.9 C) 97.9 F (36.6 C) 98.7 F (37.1 C)  TempSrc: Oral Oral Oral Oral  SpO2: 97% 98% 94% 95%  Weight:      Height:        Intake/Output Summary (Last 24 hours) at 07/03/2020 0625 Last data filed at 07/03/2020 0400 Gross per 24 hour  Intake 191.25 ml  Output 800 ml  Net -608.75 ml   Filed Weights   07/01/20 0951  Weight: 58.1 kg    Examination:   Constitutional: NAD, AAOx3 HEENT: conjunctivae and lids normal, EOMI CV: RRR systolic murmur. Distal pulses +2.  No cyanosis.   RESP: CTA B/L, normal respiratory effort  GI: +BS, NTND Extremities: No effusions, edema, or tenderness in BLE SKIN: warm, dry and intact Neuro: II - XII grossly intact.  Sensation intact Psych: Normal mood and affect.  Appropriate judgement and reason   Data Reviewed: I have personally reviewed following labs and imaging studies  CBC: Recent Labs  Lab  06/27/20 1241 07/01/20 1040 07/02/20 0424 07/03/20 0516  WBC 7.7 18.0* 11.0* 7.6  NEUTROABS 4.7 16.1*  --   --   HGB 12.1 11.4* 9.6* 9.6*  HCT 36.2 34.2* 27.8* 27.6*  MCV 97.3 96.3 95.2 93.2  PLT 251.0 170 135* 156   Basic Metabolic Panel: Recent Labs  Lab 06/27/20 1241 07/01/20 1040 07/02/20 0424 07/03/20 0516  NA 136 135 139 138  K 4.3 3.2* 3.1* 3.1*  CL 101 102 110 109  CO2 28 20* 24 24  GLUCOSE 109* 123* 88 94  BUN 24* _0 CREATININE 1.05 1.22* 0.83 0.73  CALCIUM 10.2 9.0 8.7* 8.7*  MG  --   --  1.8 2.0  PHOS  --  3.3  --   --    GFR: Estimated Creatinine  Clearance: 43.3 mL/min (by C-G formula based on SCr of 0.73 mg/dL). Liver Function Tests: Recent Labs  Lab 07/01/20 1040  AST 20  ALT 13  ALKPHOS 44  BILITOT 0.9  PROT 5.9*  ALBUMIN 3.2*   No results for input(s): LIPASE, AMYLASE in the last 168 hours. No results for input(s): AMMONIA in the last 168 hours. Coagulation Profile: Recent Labs  Lab 07/01/20 1524  INR 1.1   Cardiac Enzymes: No results for input(s): CKTOTAL, CKMB, CKMBINDEX, TROPONINI in the last 168 hours. BNP (last 3 results) No results for input(s): PROBNP in the last 8760 hours. HbA1C: No results for input(s): HGBA1C in the last 72 hours. CBG: Recent Labs  Lab 07/01/20 1046  GLUCAP 118*   Lipid Profile: No results for input(s): CHOL, HDL, LDLCALC, TRIG, CHOLHDL, LDLDIRECT in the last 72 hours. Thyroid Function Tests: No results for input(s): TSH, T4TOTAL, FREET4, T3FREE, THYROIDAB in the last 72 hours. Anemia Panel: Recent Labs    07/01/20 1040  VITAMINB12 475  TIBC 220*  IRON 6*   Sepsis Labs: Recent Labs  Lab 07/01/20 1200 07/01/20 1345 07/02/20 0424  PROCALCITON  --  1.30 1.18  LATICACIDVEN 1.5 2.0*  --     Recent Results (from the past 240 hour(s))  Blood Culture (routine x 2)     Status: None (Preliminary result)   Collection Time: 07/01/20  3:24 PM   Specimen: BLOOD  Result Value Ref Range Status   Specimen Description BLOOD RIGHT ANTECUBITAL  Final   Special Requests   Final    BOTTLES DRAWN AEROBIC AND ANAEROBIC Blood Culture adequate volume   Culture   Final    NO GROWTH < 24 HOURS Performed at [facility], [address] [place], [place] [address]    Report Status PENDING  Incomplete  Blood Culture (routine x 2)     Status: None (Preliminary result)   Collection Time: 07/01/20  3:24 PM   Specimen: BLOOD  Result Value Ref Range Status   Specimen Description BLOOD LEFT ANTECUBITAL  Final   Special Requests   Final    BOTTLES DRAWN AEROBIC AND ANAEROBIC Blood  Culture adequate volume   Culture   Final    NO GROWTH < 24 HOURS Performed at [facility], [address], [place], [place] [address]    Report Status PENDING  Incomplete  SARS Coronavirus 2 by RT PCR (hospital order, performed in [place] hospital lab) Nasopharyngeal Nasopharyngeal Swab     Status: None   Collection Time: 07/01/20  4:19 PM   Specimen: Nasopharyngeal Swab  Result Value Ref Range Status   SARS Coronavirus 2 NEGATIVE NEGATIVE Final  Comment: (NOTE) SARS-CoV-2 target nucleic acids are NOT DETECTED.  The SARS-CoV-2 RNA is generally detectable in upper and lower respiratory specimens during the acute phase of infection. The lowest concentration of SARS-CoV-2 viral copies this assay can detect is 250 copies / mL. A negative result does not preclude SARS-CoV-2 infection and should not be used as the sole basis for treatment or other patient management decisions.  A negative result may occur with improper specimen collection / handling, submission of specimen other than nasopharyngeal swab, presence of viral mutation(s) within the areas targeted by this assay, and inadequate number of viral copies (<250 copies / mL). A negative result must be combined with clinical observations, patient history, and epidemiological information.  Fact Sheet for Patients:   StrictlyIdeas.no  Fact Sheet for Healthcare Providers: BankingDealers.co.za  This test is not yet approved or  cleared by the [place] FDA and has been authorized for detection and/or diagnosis of SARS-CoV-2 by FDA under an Emergency Use Authorization (EUA).  This EUA will remain in effect (meaning this test can be used) for the duration of the COVID-19 declaration under Section 564(b)(1) of the Act, 21 U.S.C. section 360bbb-3(b)(1), unless the authorization is terminated or revoked sooner.  Performed at [facility], [address]  [place], [place] [address]   Gastrointestinal Panel by PCR , Stool     Status: None   Collection Time: 07/02/20  7:15 AM   Specimen: Stool  Result Value Ref Range Status   Campylobacter species NOT DETECTED NOT DETECTED Final   Plesimonas shigelloides NOT DETECTED NOT DETECTED Final   Salmonella species NOT DETECTED NOT DETECTED Final   Yersinia enterocolitica NOT DETECTED NOT DETECTED Final   Vibrio species NOT DETECTED NOT DETECTED Final   Vibrio cholerae NOT DETECTED NOT DETECTED Final   Enteroaggregative E coli (EAEC) NOT DETECTED NOT DETECTED Final   Enteropathogenic E coli (EPEC) NOT DETECTED NOT DETECTED Final   Enterotoxigenic E coli (ETEC) NOT DETECTED NOT DETECTED Final   Shiga like toxin producing E coli (STEC) NOT DETECTED NOT DETECTED Final   Shigella/Enteroinvasive E coli (EIEC) NOT DETECTED NOT DETECTED Final   Cryptosporidium NOT DETECTED NOT DETECTED Final   Cyclospora cayetanensis NOT DETECTED NOT DETECTED Final   Entamoeba histolytica NOT DETECTED NOT DETECTED Final   Giardia lamblia NOT DETECTED NOT DETECTED Final   Adenovirus F40/41 NOT DETECTED NOT DETECTED Final   Astrovirus NOT DETECTED NOT DETECTED Final   Norovirus GI/GII NOT DETECTED NOT DETECTED Final   Rotavirus A NOT DETECTED NOT DETECTED Final   Sapovirus (I, II, IV, and V) NOT DETECTED NOT DETECTED Final    Comment: Performed at [facility], [address] [place], [place] [address]  C Difficile Quick Screen w PCR reflex     Status: Abnormal   Collection Time: 07/02/20  7:15 AM   Specimen: STOOL  Result Value Ref Range Status   C Diff antigen POSITIVE (A) NEGATIVE Final   C Diff toxin NEGATIVE NEGATIVE Final   C Diff interpretation Results are indeterminate. See PCR results.  Final    Comment: Performed at [facility], [address] [place], [place] [address]  C. Diff by PCR, Reflexed     Status: Abnormal   Collection Time: 07/02/20  7:15 AM  Result Value Ref Range Status    Toxigenic C. Difficile by PCR POSITIVE (A) NEGATIVE Final    Comment: Positive for toxigenic C. difficile with little to no toxin production. Only treat if clinical  presentation suggests symptomatic illness. Performed at [facility], [address], [place], [place] [address]       Radiology Studies: DG Chest [place] 1 View  Result Date: 07/01/2020 CLINICAL DATA:  Tachycardia and hypotension EXAM: PORTABLE CHEST 1 VIEW COMPARISON:  June 22, 2020 FINDINGS: There is mild scarring in the right base. The lungs elsewhere are clear. Heart is upper normal in size with pulmonary vascularity normal. Pacemaker leads are attached to the right atrium and right ventricle. No adenopathy. No bone lesions. IMPRESSION: Mild scarring right base. No edema or airspace opacity. Heart upper normal in size. Pacemaker leads attached to right atrium and right ventricle. No adenopathy. Electronically Signed   By: Lowella Grip III M.D.   On: 07/01/2020 14:55     Scheduled Meds: . aspirin EC  81 mg Oral Daily  . enoxaparin (LOVENOX) injection  40 mg Subcutaneous Q24H  . fluvoxaMINE  50 mg Oral BID  . gabapentin  400 mg Oral TID  . memantine  10 mg Oral BID  . pantoprazole  40 mg Oral BID  . primidone  100 mg Oral QHS  . risperiDONE  1 mg Oral QHS  . sodium chloride flush  3 mL Intravenous Once  . topiramate  50 mg Oral TID  . vancomycin  125 mg Oral QID  . venlafaxine XR  75 mg Oral Daily  . vitamin B-12  1,000 mcg Oral Daily   Continuous Infusions: . sodium chloride Stopped (07/02/20 0525)     LOS: 2 days     Enzo Bi, MD [facility] If 7PM-7AM, please contact night-coverage 07/03/2020, 6:25 AM

## 2020-07-03 LAB — BASIC METABOLIC PANEL
Anion gap: 5 (ref 5–15)
BUN: 14 mg/dL (ref 8–23)
CO2: 24 mmol/L (ref 22–32)
Calcium: 8.7 mg/dL — ABNORMAL LOW (ref 8.9–10.3)
Chloride: 109 mmol/L (ref 98–111)
Creatinine, Ser: 0.73 mg/dL (ref 0.44–1.00)
GFR calc Af Amer: >60 mL/min (ref >60)
GFR calc non Af Amer: >60 mL/min (ref >60)
Glucose, Bld: 94 mg/dL (ref 70–99)
Potassium: 3.1 mmol/L — ABNORMAL LOW (ref 3.5–5.1)
Sodium: 138 mmol/L (ref 135–145)

## 2020-07-03 LAB — CBC
HCT: 27.6 % — ABNORMAL LOW (ref 36.0–46.0)
Hemoglobin: 9.6 g/dL — ABNORMAL LOW (ref 12.0–15.0)
MCH: 32.4 pg (ref 26.0–34.0)
MCHC: 34.8 g/dL (ref 30.0–36.0)
MCV: 93.2 fL (ref 80.0–100.0)
Platelets: 151 10*3/uL (ref 150–400)
RBC: 2.96 MIL/uL — ABNORMAL LOW (ref 3.87–5.11)
RDW: 15.5 % (ref 11.5–15.5)
WBC: 7.6 10*3/uL (ref 4.0–10.5)
nRBC: 0 % (ref 0.0–0.2)

## 2020-07-03 LAB — MAGNESIUM: Magnesium: 2 mg/dL (ref 1.7–2.4)

## 2020-07-03 LAB — PROCALCITONIN: Procalcitonin: 0.68 ng/mL

## 2020-07-03 MED ORDER — POTASSIUM CHLORIDE CRYS ER 20 MEQ PO TBCR
40.0000 meq | EXTENDED_RELEASE_TABLET | Freq: Once | ORAL | Status: AC
  Administered 2020-07-03: 40 meq via ORAL
  Filled 2020-07-03: qty 2

## 2020-07-03 MED ORDER — SODIUM CHLORIDE 0.9 % IV SOLN
200.0000 mg | Freq: Once | INTRAVENOUS | Status: AC
  Administered 2020-07-03: 200 mg via INTRAVENOUS
  Filled 2020-07-03: qty 10

## 2020-07-03 NOTE — Progress Notes (Signed)
°PROGRESS NOTE ° ° ° °Regina Hernandez  MRN:[id number] DOB: 02/06/1934 DOA: 07/01/2020 °PCP: Gessner, Deborah B, FNP  ° ° °Assessment & Plan: °  °Active Problems: °  Acute colitis °  AKI (acute kidney injury) (HCC) °  Sepsis (HCC) ° ° ° °Patient is 86-year-old female with history of gastric ulcer, essential tremor, anxiety, orthostatic hypotension with syncope who present to the hospital with 2-day history of weakness and dizziness.  She also had a diarrhea for the last 2 days. °  °#1.  Sepsis.  Likely secondary to colitis. °Patient met sepsis criteria with leukocytosis, tachycardia, acute kidney injury and mild hypotension.  Lactic acid level 2.0. °Patient has received IV fluid in the emergency room.   °  °#2.  C diff colitis, POA °Patient was on antibiotics and discontinued recently.   °--C diff antigen pos, and PCR pos, so given systemic illness and continued diarrhea, will treat for C diff. °--continue oral vanc °  °#3.  Acute kidney injury. °Cr 1.22 on presentation.  Improved to 0.73 today.  Likely secondary to dehydration.  Patient already received IV fluid in emergency room, I will hold additional IV fluids due to slight elevation of BMP.  Chest x-ray does not show any volume overload. °  °4.  Chronic orthostatic hypotension. °Cortisol level wnl. °Consider midodrine if blood pressure still low. °  °5.  Anemia, severe iron def °Patient has positive occult blood in the stool, she also has history of peptic ulcer disease.   °--anemia workup revealed severe iron def °--Continue Protonix 40 mg twice a day.  °--IV iron x1 today °  °6.  Hypokalemia. °Supplement.  Recheck potassium level and magnesium tomorrow. °  °7.  Dementia.  Continue home medicines. °  °8.  Elevated troponin.  ° Peaked at 97, most likely due to low blood pressure and demand ischemia. °  ° °DVT prophylaxis: Lovenox SQ °Code Status: DNR  °Family Communication:  °Status is: inpatient °Dispo:   °The patient is from: home °Anticipated d/c is to: home with  HHPT °Anticipated d/c date is: tomorrow °Patient currently is not medically stable to d/c due to: started on C diff treatment, will ensure improvement of diarrhea before discharge. ° ° °Subjective and Interval History:  °Pt was very happy to report that she ate a meal and didn't have diarrhea afterwards.  No N/V.   ° ° °Objective: °Vitals:  ° 07/02/20 1324 07/02/20 2141 07/03/20 0626 07/03/20 1453  °BP: 116/78 127/76 117/76 123/78  °Pulse: 78 66 63 65  °Resp: 16 16 18 14  °Temp: 97.9 °F (36.6 °C) 98.7 °F (37.1 °C) 99.1 °F (37.3 °C) 98.7 °F (37.1 °C)  °TempSrc: Oral Oral Oral Oral  °SpO2: 94% 95% 95% 95%  °Weight:      °Height:      ° ° °Intake/Output Summary (Last 24 hours) at 07/03/2020 1758 °Last data filed at 07/03/2020 1200 °Gross per 24 hour  °Intake 300 ml  °Output --  °Net 300 ml  ° °Filed Weights  ° 07/01/20 0951  °Weight: 58.1 kg  ° ° °Examination:  ° °Constitutional: NAD, AAOx3, brighter mood today °HEENT: conjunctivae and lids normal, EOMI °CV: RRR systolic murmur. Distal pulses +2.  No cyanosis.   °RESP: CTA B/L, normal respiratory effort  °GI: +BS, NTND °Extremities: No effusions, edema, or tenderness in BLE °SKIN: warm, dry and intact °Neuro: II - XII grossly intact.  Sensation intact °Psych: Normal mood and affect.  Appropriate judgement and reason ° ° °Data Reviewed: I have personally reviewed following labs and imaging   imaging studies  CBC: Recent Labs  Lab 06/27/20 1241 07/01/20 1040 07/02/20 0424 07/03/20 0516  WBC 7.7 18.0* 11.0* 7.6  NEUTROABS 4.7 16.1*  --   --   HGB 12.1 11.4* 9.6* 9.6*  HCT 36.2 34.2* 27.8* 27.6*  MCV 97.3 96.3 95.2 93.2  PLT 251.0 170 135* 867   Basic Metabolic Panel: Recent Labs  Lab 06/27/20 1241 07/01/20 1040 07/02/20 0424 07/03/20 0516  NA 136 135 139 138  K 4.3 3.2* 3.1* 3.1*  CL 101 102 110 109  CO2 28 20* 24 24  GLUCOSE 109* 123* 88 94  BUN 24* _0 CREATININE 1.05 1.22* 0.83 0.73  CALCIUM 10.2 9.0 8.7* 8.7*  MG  --   --  1.8 2.0  PHOS  --   3.3  --   --    GFR: Estimated Creatinine Clearance: 43.3 mL/min (by C-G formula based on SCr of 0.73 mg/dL). Liver Function Tests: Recent Labs  Lab 07/01/20 1040  AST 20  ALT 13  ALKPHOS 44  BILITOT 0.9  PROT 5.9*  ALBUMIN 3.2*   No results for input(s): LIPASE, AMYLASE in the last 168 hours. No results for input(s): AMMONIA in the last 168 hours. Coagulation Profile: Recent Labs  Lab 07/01/20 1524  INR 1.1   Cardiac Enzymes: No results for input(s): CKTOTAL, CKMB, CKMBINDEX, TROPONINI in the last 168 hours. BNP (last 3 results) No results for input(s): PROBNP in the last 8760 hours. HbA1C: No results for input(s): HGBA1C in the last 72 hours. CBG: Recent Labs  Lab 07/01/20 1046  GLUCAP 118*   Lipid Profile: No results for input(s): CHOL, HDL, LDLCALC, TRIG, CHOLHDL, LDLDIRECT in the last 72 hours. Thyroid Function Tests: No results for input(s): TSH, T4TOTAL, FREET4, T3FREE, THYROIDAB in the last 72 hours. Anemia Panel: Recent Labs    07/01/20 1040  VITAMINB12 475  TIBC 220*  IRON 6*   Sepsis Labs: Recent Labs  Lab 07/01/20 1200 07/01/20 1345 07/02/20 0424 07/03/20 0516  PROCALCITON  --  1.30 1.18 0.68  LATICACIDVEN 1.5 2.0*  --   --     Recent Results (from the past 240 hour(s))  Blood Culture (routine x 2)     Status: None (Preliminary result)   Collection Time: 07/01/20  3:24 PM   Specimen: BLOOD  Result Value Ref Range Status   Specimen Description BLOOD RIGHT ANTECUBITAL  Final   Special Requests   Final    BOTTLES DRAWN AEROBIC AND ANAEROBIC Blood Culture adequate volume   Culture   Final    NO GROWTH 2 DAYS Performed at [facility], [address] [place], [place] [address]    Report Status PENDING  Incomplete  Blood Culture (routine x 2)     Status: None (Preliminary result)   Collection Time: 07/01/20  3:24 PM   Specimen: BLOOD  Result Value Ref Range Status   Specimen Description BLOOD LEFT ANTECUBITAL  Final    Special Requests   Final    BOTTLES DRAWN AEROBIC AND ANAEROBIC Blood Culture adequate volume   Culture   Final    NO GROWTH 2 DAYS Performed at [facility], [address] [place], [place] [address]    Report Status PENDING  Incomplete  SARS Coronavirus 2 by RT PCR (hospital order, performed in [facility] hospital lab) Nasopharyngeal Nasopharyngeal Swab     Status: None   Collection Time: 07/01/20  4:19 PM   Specimen: Nasopharyngeal Swab  Result Value Ref  Status  ° SARS Coronavirus 2 NEGATIVE NEGATIVE Final  °  Comment: (NOTE) °SARS-CoV-2 target nucleic acids are NOT DETECTED. ° °The SARS-CoV-2 RNA is generally detectable in upper and lower °respiratory specimens during the acute phase of infection. The lowest °concentration of SARS-CoV-2 viral copies this assay can detect is 250 °copies / mL. A negative result does not preclude SARS-CoV-2 infection °and should not be used as the sole basis for treatment or other °patient management decisions.  A negative result may occur with °improper specimen collection / handling, submission of specimen other °than nasopharyngeal swab, presence of viral mutation(s) within the °areas targeted by this assay, and inadequate number of viral copies °(<250 copies / mL). A negative result must be combined with clinical °observations, patient history, and epidemiological information. ° °Fact Sheet for Patients:   °https://www.fda.gov/media/136312/download ° °Fact Sheet for Healthcare Providers: °https://www.fda.gov/media/136313/download ° °This test is not yet approved or  cleared by the [place] FDA and °has been authorized for detection and/or diagnosis of SARS-CoV-2 by °FDA under an Emergency Use Authorization (EUA).  This EUA will remain °in effect (meaning this test can be used) for the duration of the °COVID-19 declaration under Section 564(b)(1) of the Act, 21 U.S.C. °section 360bbb-3(b)(1), unless the authorization is terminated or °revoked  sooner. ° °Performed at [facility], [address] [place], °[place] [address] °  °Gastrointestinal Panel by PCR , Stool     Status: None  ° Collection Time: 07/02/20  7:15 AM  ° Specimen: Stool  °Result Value Ref Range Status  ° Campylobacter species NOT DETECTED NOT DETECTED Final  ° Plesimonas shigelloides NOT DETECTED NOT DETECTED Final  ° Salmonella species NOT DETECTED NOT DETECTED Final  ° Yersinia enterocolitica NOT DETECTED NOT DETECTED Final  ° Vibrio species NOT DETECTED NOT DETECTED Final  ° Vibrio cholerae NOT DETECTED NOT DETECTED Final  ° Enteroaggregative E coli (EAEC) NOT DETECTED NOT DETECTED Final  ° Enteropathogenic E coli (EPEC) NOT DETECTED NOT DETECTED Final  ° Enterotoxigenic E coli (ETEC) NOT DETECTED NOT DETECTED Final  ° Shiga like toxin producing E coli (STEC) NOT DETECTED NOT DETECTED Final  ° Shigella/Enteroinvasive E coli (EIEC) NOT DETECTED NOT DETECTED Final  ° Cryptosporidium NOT DETECTED NOT DETECTED Final  ° Cyclospora cayetanensis NOT DETECTED NOT DETECTED Final  ° Entamoeba histolytica NOT DETECTED NOT DETECTED Final  ° Giardia lamblia NOT DETECTED NOT DETECTED Final  ° Adenovirus F40/41 NOT DETECTED NOT DETECTED Final  ° Astrovirus NOT DETECTED NOT DETECTED Final  ° Norovirus GI/GII NOT DETECTED NOT DETECTED Final  ° Rotavirus A NOT DETECTED NOT DETECTED Final  ° Sapovirus (I, II, IV, and V) NOT DETECTED NOT DETECTED Final  °  Comment: Performed at [facility], [address] [place], [place] [address]  °C Difficile Quick Screen w PCR reflex     Status: Abnormal  ° Collection Time: 07/02/20  7:15 AM  ° Specimen: STOOL  °Result Value Ref Range Status  ° C Diff antigen POSITIVE (A) NEGATIVE Final  ° C Diff toxin NEGATIVE NEGATIVE Final  ° C Diff interpretation Results are indeterminate. See PCR results.  Final  °  Comment: Performed at [facility], [address] [place],  [address]  °C. Diff by PCR, Reflexed     Status: Abnormal  °  Collection Time: 07/02/20  7:15 AM  °Result Value Ref Range Status  ° Toxigenic C. Difficile by PCR POSITIVE (A) NEGATIVE Final  °  Comment: Positive for toxigenic   C. difficile with little to no toxin production. Only treat if clinical presentation suggests symptomatic illness. °Performed at [facility], [address] [place], [place] [address] °  °  ° ° °Radiology Studies: °No results found. ° ° °Scheduled Meds: °• aspirin EC  81 mg Oral Daily  °• enoxaparin (LOVENOX) injection  40 mg Subcutaneous Q24H  °• fluvoxaMINE  50 mg Oral BID  °• gabapentin  400 mg Oral TID  °• memantine  10 mg Oral BID  °• pantoprazole  40 mg Oral BID  °• primidone  100 mg Oral QHS  °• risperiDONE  1 mg Oral QHS  °• sodium chloride flush  3 mL Intravenous Once  °• topiramate  50 mg Oral TID  °• vancomycin  125 mg Oral QID  °• venlafaxine XR  75 mg Oral Daily  °• vitamin B-12  1,000 mcg Oral Daily  ° °Continuous Infusions: °• sodium chloride Stopped (07/02/20 0525)  ° ° ° LOS: 2 days  ° ° ° ° , MD °[facility] °If 7PM-7AM, please contact night-coverage °07/03/2020, 5:58 PM  °

## 2020-07-03 NOTE — Evaluation (Signed)
Physical Therapy Evaluation Patient Details Name: Regina Hernandez MRN: [id number] DOB: November 23, 1934 Today's Date: 07/03/2020   History of Present Illness  Patient is 86 year old female with history of gastric ulcer, essential tremor, anxiety, orthostatic hypotension with syncope who present to the hospital with weakness, dizziness, and diarrhea. Found to have sepsis likely secondary to colitis, AKI, chronic orthostatic hypotension, anemia, hypokalemia, elevated troponin. History of orthostatic hypotension with syncope, anxiety, memory loss, dementia, cervical dystonia, essential tremor.   Clinical Impression  Patient is typically Mod I with use of 4 wheeled walker at home and lives with family. Patient currently at Mod I level of care with bed mobility, transfers, and ambulation using rolling walker. No loss of balance noted with functional standing activity. No shortness of breath noted with activity. Sp02 97% on room air, pulse 65, and blood pressure 150/88 mmHg measured directly after walking. Patient able to demonstrate BLE exercises to maintain strength/endurance independently and without difficulty. Patient currently feels she is at her baseline level of functional mobility and she reports having 24 hour assistance at home if needed from her supportive family members. No acute PT needs are identified at this time. Recommend routine mobility with nursing supervision while in the hospital.     Follow Up Recommendations No PT follow up;Supervision - Intermittent    Equipment Recommendations  None recommended by PT    Recommendations for Other Services       Precautions / Restrictions Precautions Precautions: Fall Restrictions Weight Bearing Restrictions: No      Mobility  Bed Mobility Overal bed mobility: Modified Independent             General bed mobility comments: supine to and from sitting with head of bed slightly elevated   Transfers Overall transfer level: Modified  independent Equipment used: Rolling walker (2 wheeled) Transfers: Sit to/from Stand Sit to Stand: Modified independent (Device/Increase time)         General transfer comment: good safety awareness with functional transfers without verbal cueing   Ambulation/Gait Ambulation/Gait assistance: Min guard Gait Distance (Feet): 60 Feet Assistive device: Rolling walker (2 wheeled)   Gait velocity: normal    General Gait Details: Gait distance limited as patient on enteric isolation precautions and did not exit room. No loss of balance with ambulaiton, no dizziness or lightheadedness reported.   Stairs            Wheelchair Mobility    Modified Rankin (Stroke Patients Only)       Balance   Sitting-balance support: No upper extremity supported;Feet supported Sitting balance-Leahy Scale: Normal Sitting balance - Comments: patient able to reach outside base of support without loss of balance    Standing balance support: Bilateral upper extremity supported Standing balance-Leahy Scale: Good                               Pertinent Vitals/Pain Pain Assessment: No/denies pain    Home Living Family/patient expects to be discharged to:: Private residence Living Arrangements: Children Available Help at Discharge: Available 24 hours/day;Family Type of Home: House Home Access: Ramped entrance     Home Layout: One level Home Equipment: Environmental consultant - 4 wheels;Cane - single point;Wheelchair - manual;Bedside commode      Prior Function Level of Independence: Independent         Comments: patient reports no recent falls      Hand Dominance   Dominant Hand: Right    Extremity/Trunk Assessment  Upper Extremity Assessment Upper Extremity Assessment: Overall WFL for tasks assessed    Lower Extremity Assessment Lower Extremity Assessment: Overall WFL for tasks assessed       Communication   Communication: No difficulties  Cognition Arousal/Alertness:  Awake/alert Behavior During Therapy: WFL for tasks assessed/performed Overall Cognitive Status: Within Functional Limits for tasks assessed                                        General Comments      Exercises General Exercises - Lower Extremity Ankle Circles/Pumps: AROM;Both;10 reps;Strengthening;Supine Heel Slides: AROM;Strengthening;Both;10 reps;Supine Hip ABduction/ADduction: AROM;Strengthening;Both;10 reps;Supine Straight Leg Raises: AROM;Strengthening;Both;10 reps;Supine Other Exercises Other Exercises: exercies performed and encouraged to maintain strength. verbal cues for technique. patient able to perform exercises without difficulty    Assessment/Plan    PT Assessment Patent does not need any further PT services  PT Problem List         PT Treatment Interventions      PT Goals (Current goals can be found in the Care Plan section)  Acute Rehab PT Goals Patient Stated Goal: to go home as soon as possible     Frequency     Barriers to discharge        Co-evaluation               AM-PAC PT "6 Clicks" Mobility  Outcome Measure Help needed turning from your back to your side while in a flat bed without using bedrails?: None Help needed moving from lying on your back to sitting on the side of a flat bed without using bedrails?: None Help needed moving to and from a bed to a chair (including a wheelchair)?: None Help needed standing up from a chair using your arms (e.g., wheelchair or bedside chair)?: None Help needed to walk in hospital room?: None Help needed climbing 3-5 steps with a railing? : None 6 Click Score: 24    End of Session   Activity Tolerance: Patient tolerated treatment well Patient left: in bed;with call bell/phone within reach;with bed alarm set   PT Visit Diagnosis: Unsteadiness on feet (R26.81)    Time: 9528-4132 PT Time Calculation (min) (ACUTE ONLY): 21 min   Charges:   PT Evaluation $PT Eval Low Complexity:  1 Low          Minna Merritts, PT, MPT  Percell Locus 07/03/2020, 2:23 PM

## 2020-07-03 NOTE — Plan of Care (Signed)
Patient having loose BM's but states feeling better.  No nausea or pain verbalized.

## 2020-07-04 ENCOUNTER — Ambulatory Visit: Payer: Medicare Other | Admitting: Urology

## 2020-07-04 DIAGNOSIS — N179 Acute kidney failure, unspecified: Secondary | ICD-10-CM

## 2020-07-04 LAB — CBC
HCT: 29.8 % — ABNORMAL LOW (ref 36.0–46.0)
Hemoglobin: 10 g/dL — ABNORMAL LOW (ref 12.0–15.0)
MCH: 31.6 pg (ref 26.0–34.0)
MCHC: 33.6 g/dL (ref 30.0–36.0)
MCV: 94.3 fL (ref 80.0–100.0)
Platelets: 160 10*3/uL (ref 150–400)
RBC: 3.16 MIL/uL — ABNORMAL LOW (ref 3.87–5.11)
RDW: 15.3 % (ref 11.5–15.5)
WBC: 6.8 10*3/uL (ref 4.0–10.5)
nRBC: 0 % (ref 0.0–0.2)

## 2020-07-04 LAB — BASIC METABOLIC PANEL
Anion gap: 5 (ref 5–15)
BUN: 15 mg/dL (ref 8–23)
CO2: 24 mmol/L (ref 22–32)
Calcium: 8.9 mg/dL (ref 8.9–10.3)
Chloride: 109 mmol/L (ref 98–111)
Creatinine, Ser: 0.79 mg/dL (ref 0.44–1.00)
GFR calc Af Amer: >60 mL/min (ref >60)
GFR calc non Af Amer: >60 mL/min (ref >60)
Glucose, Bld: 92 mg/dL (ref 70–99)
Potassium: 3.6 mmol/L (ref 3.5–5.1)
Sodium: 138 mmol/L (ref 135–145)

## 2020-07-04 LAB — URINE CULTURE: Culture: NO GROWTH

## 2020-07-04 LAB — MAGNESIUM: Magnesium: 2.1 mg/dL (ref 1.7–2.4)

## 2020-07-04 MED ORDER — VANCOMYCIN HCL 125 MG PO CAPS: 125.0000 mg | ORAL_CAPSULE | Freq: Four times a day (QID) | ORAL | 0 refills | Status: AC

## 2020-07-04 NOTE — Discharge Summary (Signed)
Physician Discharge Summary   Regina Hernandez  female DOB: 05/11/1934  SWH:[id number]  PCP: Elby Beck, FNP  Admit date: 07/01/2020 Discharge date: 07/04/2020  Admitted From: home Disposition:  Home.  Daughter updated on the phone about discharge plans.  Home Health: No  PT evaluated and patient has no PT needs.  CODE STATUS: DNR  Discharge Instructions    Discharge instructions   Complete by: As directed    You have C diff infection that caused your diarrhea.  We have started you on treatment with oral vancomycin and your symptoms and labs have much improved.  Please finish taking 8 more days of oral vancomycin as directed at home.     Dr. Enzo Bi - -   Increase activity slowly   Complete by: As directed        Hospital Course:  For full details, please see H&P, progress notes, consult notes and ancillary notes.  Briefly,  Regina Hernandez is 86 year old Caucasian female with history of gastric ulcer, essential tremor, anxiety, orthostatic hypotension with syncope who present to the hospital with 2-day history of weakness and dizziness.She also had a diarrhea for the last 2 days.   1. Sepsis. Likely secondary to colitis. Patient met sepsis criteria with leukocytosis, tachycardia, acute kidney injury and mild hypotension. Lactic acid level 2.0.  Patient received IV fluid in the emergency room.  2. C diff colitis, POA Patient was on antibiotics and discontinued recently. C diff antigen pos, and PCR pos, so given systemic illness and continued diarrhea, C diff treatment started with oral vanc.  Pt's diarrhea improved quickly with treatment, and pt was tolerating regular diet, so pt was discharged on 8 more days of oral vanc.  3. Acute kidney injury. Cr 1.22 on presentation.  Improved to 0.73 prior to presentation.  Likely secondary to dehydration and infection.    4. Chronic orthostatic hypotension. Cortisol level wnl.  BP acceptable during this  hospitalization.  5. Anemia, severe iron def Patient has positive occult blood in the stool, she also has history of peptic ulcer disease. Anemia workup revealed severe iron def.  Pt received IV iron x1.  Continued Protonix 40 mg twice a day. Hgb stable around 9-10, and no sign of active bleeding, so no need for inpatient GI workup.  6. Hypokalemia. Repleted PRN.  7. Dementia.  Continued home medicines.  8. Elevated troponin.  Peaked at 49, most likely due to low blood pressure and demand ischemia.   Discharge Diagnoses:  Active Problems:   Acute colitis   AKI (acute kidney injury) ([place])   Sepsis [facility])    Discharge Instructions:  Allergies as of 07/04/2020      Reactions   Adhesive [tape] Other (See Comments)   Contact dermatitis   Baclofen Rash   Lyrica [pregabalin] Other (See Comments)   Unable to remember reaction.   Sulfa Antibiotics Rash      Medication List    TAKE these medications   acetaminophen 500 MG tablet Commonly known as: TYLENOL Take 2 tablets (1,000 mg total) by mouth every 8 (eight) hours as needed for mild pain or moderate pain.   Aspirin 81 81 MG EC tablet Generic drug: aspirin Take 1 tablet by mouth daily.   estradiol 0.1 MG/GM vaginal cream Commonly known as: ESTRACE Place 1 Applicatorful vaginally as needed.   fluvoxaMINE 50 MG tablet Commonly known as: LUVOX Take 50 mg by mouth 2 (two) times daily.   gabapentin 400 MG capsule Commonly known as:  NEURONTIN Take 1 capsule (400 mg total) by mouth 3 (three) times daily.   memantine 10 MG tablet Commonly known as: NAMENDA TAKE 1 TABLET BY MOUTH TWICE A DAY   pantoprazole 40 MG tablet Commonly known as: PROTONIX Take 1 tablet (40 mg total) by mouth 2 (two) times daily.   primidone 50 MG tablet Commonly known as: MYSOLINE Take 2 tablets (100 mg total) by mouth at bedtime.   risperiDONE 2 MG tablet Commonly known as: RISPERDAL Take 1 mg by mouth at bedtime.   STOOL  SOFTENER PO Take 1 capsule by mouth in the morning and at bedtime.   topiramate 50 MG tablet Commonly known as: TOPAMAX Take 50 mg by mouth 3 (three) times daily.   vancomycin 125 MG capsule Commonly known as: Vancocin HCl Take 1 capsule (125 mg total) by mouth 4 (four) times daily for 8 days. For your C diff infection.   venlafaxine XR 75 MG 24 hr capsule Commonly known as: EFFEXOR-XR Take 75 mg by mouth daily.   vitamin B-12 1000 MCG tablet Commonly known as: CYANOCOBALAMIN Take 1,000 mcg by mouth daily.   VITAMIN D3 PO Take 25 mcg by mouth daily.        Follow-up Information    Elby Beck, FNP. Schedule an appointment as soon as possible for a visit in 1 week(s).   Specialties: Nurse Practitioner, Family Medicine Contact information: [place] [address] [phone]        Kate Sable, MD .   Specialties: Cardiology, Radiology Contact information: [place] [address] [phone]               Allergies  Allergen Reactions  . Adhesive [Tape] Other (See Comments)    Contact dermatitis  . Baclofen Rash  . Lyrica [Pregabalin] Other (See Comments)    Unable to remember reaction.  . Sulfa Antibiotics Rash     The results of significant diagnostics from this hospitalization (including imaging, microbiology, ancillary and laboratory) are listed below for reference.   Consultations:   Procedures/Studies: DG Chest 2 View  Result Date: 06/22/2020 CLINICAL DATA:  Chest pain EXAM: CHEST - 2 VIEW COMPARISON:  None. FINDINGS: The heart size and mediastinal contours are within normal limits. Both lungs are clear. The visualized skeletal structures are unremarkable. Expected location of left chest wall pacemaker leads. IMPRESSION: No active cardiopulmonary disease. Electronically Signed   By: Ulyses Jarred M.D.   On: 06/22/2020 01:00   DG Chest Port 1 View  Result Date: 07/01/2020 CLINICAL DATA:   Tachycardia and hypotension EXAM: PORTABLE CHEST 1 VIEW COMPARISON:  June 22, 2020 FINDINGS: There is mild scarring in the right base. The lungs elsewhere are clear. Heart is upper normal in size with pulmonary vascularity normal. Pacemaker leads are attached to the right atrium and right ventricle. No adenopathy. No bone lesions. IMPRESSION: Mild scarring right base. No edema or airspace opacity. Heart upper normal in size. Pacemaker leads attached to right atrium and right ventricle. No adenopathy. Electronically Signed   By: Lowella Grip III M.D.   On: 07/01/2020 14:55   ECHOCARDIOGRAM COMPLETE  Result Date: 06/22/2020    ECHOCARDIOGRAM REPORT   Patient Name:   [facility] Date of Exam: 06/22/2020 Medical Rec #:  [id number]     Height:       63.0 in Accession #:    [id number]    Weight:       123.0 lb Date of  Birth:  Jan 10, 1934     BSA:          1.573 m Patient Age:    64 years      BP:           147/75 mmHg Patient Gender: F             HR:           72 bpm. Exam Location:  [facility] Procedure: 2D Echo Indications:     Ventricular Tachycardia I47.2  History:         Patient has prior history of Echocardiogram examinations, most                  recent 03/07/2020.  Sonographer:     Arville Go RDCS Referring Phys:  [id number] Cleaster Corin PATEL Diagnosing Phys: Mertie Moores MD IMPRESSIONS  1. Left ventricular ejection fraction, by estimation, is 65 to 70%. The left ventricle has normal function. The left ventricle has no regional wall motion abnormalities. There is mild left ventricular hypertrophy. Left ventricular diastolic parameters are consistent with Grade I diastolic dysfunction (impaired relaxation).  2. Right ventricular systolic function is normal. The right ventricular size is normal.  3. The mitral valve is grossly normal. No evidence of mitral valve regurgitation. No evidence of mitral stenosis.  4. The aortic valve is tricuspid. Aortic valve regurgitation is not visualized. Mild aortic valve  sclerosis is present, with no evidence of aortic valve stenosis. FINDINGS  Left Ventricle: Left ventricular ejection fraction, by estimation, is 65 to 70%. The left ventricle has normal function. The left ventricle has no regional wall motion abnormalities. The left ventricular internal cavity size was normal in size. There is  mild left ventricular hypertrophy. Left ventricular diastolic parameters are consistent with Grade I diastolic dysfunction (impaired relaxation). Right Ventricle: The right ventricular size is normal. No increase in right ventricular wall thickness. Right ventricular systolic function is normal. Left Atrium: Left atrial size was normal in size. Right Atrium: Right atrial size was normal in size. Pericardium: There is no evidence of pericardial effusion. Mitral Valve: The mitral valve is grossly normal. No evidence of mitral valve regurgitation. No evidence of mitral valve stenosis. Tricuspid Valve: The tricuspid valve is grossly normal. Tricuspid valve regurgitation is not demonstrated. No evidence of tricuspid stenosis. Aortic Valve: The aortic valve is tricuspid. Aortic valve regurgitation is not visualized. Mild aortic valve sclerosis is present, with no evidence of aortic valve stenosis. Aortic valve peak gradient measures 10.8 mmHg. Pulmonic Valve: The pulmonic valve was grossly normal. Pulmonic valve regurgitation is not visualized. Aorta: The aortic root and ascending aorta are structurally normal, with no evidence of dilitation. IAS/Shunts: The atrial septum is grossly normal.  LEFT VENTRICLE PLAX 2D LVIDd:         3.24 cm  Diastology LVIDs:         2.10 cm  LV e' lateral:   10.40 cm/s LV PW:         1.21 cm  LV E/e' lateral: 10.3 LV IVS:        1.20 cm  LV e' medial:    7.18 cm/s LVOT diam:     2.10 cm  LV E/e' medial:  14.9 LV SV:         118 LV SV Index:   75 LVOT Area:     3.46 cm  RIGHT VENTRICLE RV Basal diam:  3.06 cm TAPSE (M-mode): 2.6 cm LEFT ATRIUM  Index        RIGHT ATRIUM           Index LA diam:        2.70 cm 1.72 cm/m  RA Area:     12.00 cm LA Vol (A2C):   32.2 ml 20.47 ml/m RA Volume:   25.20 ml  16.02 ml/m LA Vol (A4C):   32.3 ml 20.54 ml/m LA Biplane Vol: 35.3 ml 22.45 ml/m  AORTIC VALVE                 PULMONIC VALVE AV Area (Vmax): 3.27 cm     PV Vmax:       0.95 m/s AV Vmax:        164.00 cm/s  PV Peak grad:  3.6 mmHg AV Peak Grad:   10.8 mmHg LVOT Vmax:      155.00 cm/s LVOT Vmean:     113.000 cm/s LVOT VTI:       0.341 m  AORTA Ao Root diam: 3.00 cm Ao Asc diam:  3.10 cm MITRAL VALVE                TRICUSPID VALVE MV Area (PHT): 4.15 cm     TV Peak grad:   23.7 mmHg MV Decel Time: 183 msec     TV Vmax:        2.44 m/s MV E velocity: 107.00 cm/s MV A velocity: 121.00 cm/s  SHUNTS MV E/A ratio:  0.88         Systemic VTI:  0.34 m                             Systemic Diam: 2.10 cm Mertie Moores MD Electronically signed by Mertie Moores MD Signature Date/Time: 06/22/2020/3:14:56 PM    Final       Labs: BNP (last 3 results) Recent Labs    07/01/20 1040  BNP 037.5*   Basic Metabolic Panel: Recent Labs  Lab 06/27/20 1241 07/01/20 1040 07/02/20 0424 07/03/20 0516 07/04/20 0705  NA 136 135 139 138 138  K 4.3 3.2* 3.1* 3.1* 3.6  CL 101 102 110 109 109  CO2 28 20* '24 24 24  ' GLUCOSE 109* 123* 88 94 92  BUN 24* '22 18 14 15  ' CREATININE 1.05 1.22* 0.83 0.73 0.79  CALCIUM 10.2 9.0 8.7* 8.7* 8.9  MG  --   --  1.8 2.0 2.1  PHOS  --  3.3  --   --   --    Liver Function Tests: Recent Labs  Lab 07/01/20 1040  AST 20  ALT 13  ALKPHOS 44  BILITOT 0.9  PROT 5.9*  ALBUMIN 3.2*   No results for input(s): LIPASE, AMYLASE in the last 168 hours. No results for input(s): AMMONIA in the last 168 hours. CBC: Recent Labs  Lab 06/27/20 1241 07/01/20 1040 07/02/20 0424 07/03/20 0516 07/04/20 0705  WBC 7.7 18.0* 11.0* 7.6 6.8  NEUTROABS 4.7 16.1*  --   --   --   HGB 12.1 11.4* 9.6* 9.6* 10.0*  HCT 36.2 34.2* 27.8* 27.6* 29.8*  MCV  97.3 96.3 95.2 93.2 94.3  PLT 251.0 170 135* 151 160   Cardiac Enzymes: No results for input(s): CKTOTAL, CKMB, CKMBINDEX, TROPONINI in the last 168 hours. BNP: Invalid input(s): POCBNP CBG: Recent Labs  Lab 07/01/20 1046  GLUCAP 118*   D-Dimer No results for input(s): DDIMER in the last 72 hours. Hgb A1c  No results for input(s): HGBA1C in the last 72 hours. Lipid Profile No results for input(s): CHOL, HDL, LDLCALC, TRIG, CHOLHDL, LDLDIRECT in the last 72 hours. Thyroid function studies No results for input(s): TSH, T4TOTAL, T3FREE, THYROIDAB in the last 72 hours.  Invalid input(s): FREET3 Anemia work up Recent Labs    07/01/20 1040  VITAMINB12 475  TIBC 220*  IRON 6*   Urinalysis    Component Value Date/Time   COLORURINE YELLOW (A) 07/01/2020 1146   APPEARANCEUR HAZY (A) 07/01/2020 1146   APPEARANCEUR Hazy (A) 02/29/2020 0927   LABSPEC 1.021 07/01/2020 1146   PHURINE 5.0 07/01/2020 1146   GLUCOSEU 50 (A) 07/01/2020 1146   HGBUR SMALL (A) 07/01/2020 1146   BILIRUBINUR NEGATIVE 07/01/2020 1146   BILIRUBINUR Negative 02/29/2020 [place] 07/01/2020 1146   PROTEINUR 30 (A) 07/01/2020 1146   NITRITE NEGATIVE 07/01/2020 1146   LEUKOCYTESUR NEGATIVE 07/01/2020 1146   Sepsis Labs Invalid input(s): PROCALCITONIN,  WBC,  LACTICIDVEN Microbiology Recent Results (from the past 240 hour(s))  Urine culture     Status: None   Collection Time: 07/01/20 11:46 AM   Specimen: In/Out Cath Urine  Result Value Ref Range Status   Specimen Description   Final    IN/OUT CATH URINE Performed at [facility], [address], [place], [place] [address]    Special Requests   Final    NONE Performed at [facility], [address], [place], [place] [address]    Culture   Final    NO GROWTH Performed at [facility], [place] [address], [place], [place] [address]    Report Status 07/04/2020 FINAL  Final  Blood Culture (routine x 2)      Status: None (Preliminary result)   Collection Time: 07/01/20  3:24 PM   Specimen: BLOOD  Result Value Ref Range Status   Specimen Description BLOOD RIGHT ANTECUBITAL  Final   Special Requests   Final    BOTTLES DRAWN AEROBIC AND ANAEROBIC Blood Culture adequate volume   Culture   Final    NO GROWTH 3 DAYS Performed at [facility], [address], [place], [place] [address]    Report Status PENDING  Incomplete  Blood Culture (routine x 2)     Status: None (Preliminary result)   Collection Time: 07/01/20  3:24 PM   Specimen: BLOOD  Result Value Ref Range Status   Specimen Description BLOOD LEFT ANTECUBITAL  Final   Special Requests   Final    BOTTLES DRAWN AEROBIC AND ANAEROBIC Blood Culture adequate volume   Culture   Final    NO GROWTH 3 DAYS Performed at [facility], [address] [place], [place] [address]    Report Status PENDING  Incomplete  SARS Coronavirus 2 by RT PCR (hospital order, performed in [place] hospital lab) Nasopharyngeal Nasopharyngeal Swab     Status: None   Collection Time: 07/01/20  4:19 PM   Specimen: Nasopharyngeal Swab  Result Value Ref Range Status   SARS Coronavirus 2 NEGATIVE NEGATIVE Final    Comment: (NOTE) SARS-CoV-2 target nucleic acids are NOT DETECTED.  The SARS-CoV-2 RNA is generally detectable in upper and lower respiratory specimens during the acute phase of infection. The lowest concentration of SARS-CoV-2 viral copies this assay can detect is 250 copies / mL. A negative result does not preclude SARS-CoV-2 infection and should not be used as the sole basis for treatment or other patient management decisions.  A  negative result may occur with improper specimen collection / handling, submission of specimen other than nasopharyngeal swab, presence of viral mutation(s) within the areas targeted by this assay, and inadequate number of viral copies (<250 copies / mL). A negative result must be combined with  clinical observations, patient history, and epidemiological information.  Fact Sheet for Patients:   StrictlyIdeas.no  Fact Sheet for Healthcare Providers: BankingDealers.co.za  This test is not yet approved or  cleared by the [place] FDA and has been authorized for detection and/or diagnosis of SARS-CoV-2 by FDA under an Emergency Use Authorization (EUA).  This EUA will remain in effect (meaning this test can be used) for the duration of the COVID-19 declaration under Section 564(b)(1) of the Act, 21 U.S.C. section 360bbb-3(b)(1), unless the authorization is terminated or revoked sooner.  Performed at [facility], [address] [place], [place] [address]   Gastrointestinal Panel by PCR , Stool     Status: None   Collection Time: 07/02/20  7:15 AM   Specimen: Stool  Result Value Ref Range Status   Campylobacter species NOT DETECTED NOT DETECTED Final   Plesimonas shigelloides NOT DETECTED NOT DETECTED Final   Salmonella species NOT DETECTED NOT DETECTED Final   Yersinia enterocolitica NOT DETECTED NOT DETECTED Final   Vibrio species NOT DETECTED NOT DETECTED Final   Vibrio cholerae NOT DETECTED NOT DETECTED Final   Enteroaggregative E coli (EAEC) NOT DETECTED NOT DETECTED Final   Enteropathogenic E coli (EPEC) NOT DETECTED NOT DETECTED Final   Enterotoxigenic E coli (ETEC) NOT DETECTED NOT DETECTED Final   Shiga like toxin producing E coli (STEC) NOT DETECTED NOT DETECTED Final   Shigella/Enteroinvasive E coli (EIEC) NOT DETECTED NOT DETECTED Final   Cryptosporidium NOT DETECTED NOT DETECTED Final   Cyclospora cayetanensis NOT DETECTED NOT DETECTED Final   Entamoeba histolytica NOT DETECTED NOT DETECTED Final   Giardia lamblia NOT DETECTED NOT DETECTED Final   Adenovirus F40/41 NOT DETECTED NOT DETECTED Final   Astrovirus NOT DETECTED NOT DETECTED Final   Norovirus GI/GII NOT DETECTED NOT DETECTED Final    Rotavirus A NOT DETECTED NOT DETECTED Final   Sapovirus (I, II, IV, and V) NOT DETECTED NOT DETECTED Final    Comment: Performed at [facility], [address] [place], [place] [address]  C Difficile Quick Screen w PCR reflex     Status: Abnormal   Collection Time: 07/02/20  7:15 AM   Specimen: STOOL  Result Value Ref Range Status   C Diff antigen POSITIVE (A) NEGATIVE Final   C Diff toxin NEGATIVE NEGATIVE Final   C Diff interpretation Results are indeterminate. See PCR results.  Final    Comment: Performed at [facility], [address] [place], [place] [address]  C. Diff by PCR, Reflexed     Status: Abnormal   Collection Time: 07/02/20  7:15 AM  Result Value Ref Range Status   Toxigenic C. Difficile by PCR POSITIVE (A) NEGATIVE Final    Comment: Positive for toxigenic C. difficile with little to no toxin production. Only treat if clinical presentation suggests symptomatic illness. Performed at [facility], [address] [place], [place] [address]      Total time spend on discharging this patient, including the last patient exam, discussing the hospital stay, instructions for ongoing care as it relates to all pertinent caregivers, as well as preparing the medical discharge records, prescriptions, and/or referrals as applicable, is 40 minutes.    Enzo Bi, MD  [facility] 07/04/2020, 9:03 AM  If 7PM-7AM, please contact night-coverage

## 2020-07-06 LAB — CULTURE, BLOOD (ROUTINE X 2)
Culture: NO GROWTH
Culture: NO GROWTH
Special Requests: ADEQUATE
Special Requests: ADEQUATE

## 2020-07-07 ENCOUNTER — Telehealth: Payer: Self-pay

## 2020-07-07 NOTE — Telephone Encounter (Signed)
Per daughter [name]  Transition Care Management Follow-up Telephone Call  Date of discharge and from where: 07/04/2020 [facility]  How have you been since you were released from the hospital? good  Any questions or concerns? No   Items Reviewed:  Did the pt receive and understand the discharge instructions provided? Yes   Medications obtained and verified? Yes   Any new allergies since your discharge? No   Dietary orders reviewed? Yes  Do you have support at home? Yes   Functional Questionnaire: (I = Independent and D = Dependent) ADLs: I  Bathing/Dressing- I  Meal Prep- D  Eating- I  Maintaining continence- I  Transferring/Ambulation- I  Managing Meds- D  Follow up appointments reviewed:   PCP Hospital f/u appt confirmed? Yes  Scheduled to see Clarene Reamer on 07/09/2020 @ 2:00..  Are transportation arrangements needed? No   If their condition worsens, is the pt aware to call PCP or go to the Emergency Dept.? Yes  Was the patient provided with contact information for the PCP's office or ED? Yes  Was to pt encouraged to call back with questions or concerns? Yes

## 2020-07-09 ENCOUNTER — Other Ambulatory Visit: Payer: Self-pay

## 2020-07-09 ENCOUNTER — Ambulatory Visit (INDEPENDENT_AMBULATORY_CARE_PROVIDER_SITE_OTHER): Payer: Medicare Other | Admitting: Family Medicine

## 2020-07-09 ENCOUNTER — Encounter: Payer: Self-pay | Admitting: Family Medicine

## 2020-07-09 VITALS — BP 116/74 | HR 74 | Temp 97.3°F | Ht 63.0 in | Wt 131.0 lb

## 2020-07-09 DIAGNOSIS — K529 Noninfective gastroenteritis and colitis, unspecified: Secondary | ICD-10-CM | POA: Diagnosis not present

## 2020-07-09 DIAGNOSIS — Z09 Encounter for follow-up examination after completed treatment for conditions other than malignant neoplasm: Secondary | ICD-10-CM | POA: Diagnosis not present

## 2020-07-09 NOTE — Patient Instructions (Signed)
Good to see you today  Please follow up in 2 months  Let me know if diarrhea returns

## 2020-07-09 NOTE — Progress Notes (Signed)
Subjective:    Patient ID: Regina Hernandez, female    DOB: 12-04-1934, 86 y.o.   MRN: [id number]  HPI Chief Complaint  Patient presents with  . Follow-up    f/u hospital discharge on Friday 07-04-2020   This is an 86 yo female who presents today for hospital follow up.  She is accompanied by her daughter.  Was admitted 7/6-07/04/2020 with c. Diff colitis.  Since discharge has been doing well.  Feels like she is regaining her strength.  She is having 2-3 bowel movements a day with some formed stool.  No abdominal pain or cramping.  No blood in bowel movements.  No dysuria, hematuria. She has not had any further dizziness or lightheadedness since she has gotten home.  Appetite is good.  Water intake improved. She has upcoming appointments with cardiology, urology and gastroenterology.  Current Outpatient Medications  Medication Instructions  . acetaminophen (TYLENOL) 1,000 mg, Oral, Every 8 hours PRN  . aspirin (ASPIRIN 81) 81 MG EC tablet 1 tablet, Oral, Daily  . Cholecalciferol (VITAMIN D3 PO) 25 mcg, Oral, Daily  . Docusate Calcium (STOOL SOFTENER PO) 1 capsule, Oral, 2 times daily  . estradiol (ESTRACE) 0.1 MG/GM vaginal cream 1 Applicatorful, Vaginal, As needed  . fluvoxaMINE (LUVOX) 50 mg, Oral, 2 times daily  . gabapentin (NEURONTIN) 400 mg, Oral, 3 times daily  . memantine (NAMENDA) 10 MG tablet TAKE 1 TABLET BY MOUTH TWICE A DAY  . pantoprazole (PROTONIX) 40 mg, Oral, 2 times daily  . primidone (MYSOLINE) 100 mg, Oral, Daily at bedtime  . risperiDONE (RISPERDAL) 1 mg, Oral, Daily at bedtime  . topiramate (TOPAMAX) 50 mg, Oral, 3 times daily  . vancomycin (VANCOCIN HCL) 125 mg, Oral, 4 times daily, For your C diff infection.  Marland Kitchen venlafaxine XR (EFFEXOR-XR) 75 mg, Oral, Daily  . vitamin B-12 (CYANOCOBALAMIN) 1,000 mcg, Oral, Daily   Past Medical History:  Diagnosis Date  . Anxiety   . Cervical dystonia   . Essential tremor   . GI bleed   . Memory loss   . Migraine   . Occipital  neuralgia    Past Surgical History:  Procedure Laterality Date  . BOWEL RESECTION    . CARDIAC CATHETERIZATION  2016   no stents/[facility]  . ESOPHAGOGASTRODUODENOSCOPY N/A 06/18/2020   Procedure: ESOPHAGOGASTRODUODENOSCOPY (EGD);  Surgeon: Toledo, Benay Pike, MD;  Location: [facility] ENDOSCOPY;  Service: Gastroenterology;  Laterality: N/A;  . PACEMAKER IMPLANT    . REPLACEMENT TOTAL KNEE Left    Family History  Problem Relation Age of Onset  . Other Mother        "old age"  . Ulcers Father   . Stomach cancer Brother    Social History   Tobacco Use  . Smoking status: Never Smoker  . Smokeless tobacco: Never Used  Vaping Use  . Vaping Use: Never used  Substance Use Topics  . Alcohol use: Not Currently  . Drug use: Never      Review of Systems Per HPI    Objective:   Physical Exam Vitals reviewed.  Constitutional:      General: She is not in acute distress.    Appearance: Normal appearance. She is normal weight. She is not ill-appearing, toxic-appearing or diaphoretic.  HENT:     Head: Normocephalic and atraumatic.     Right Ear: External ear normal.     Left Ear: External ear normal.  Eyes:     Conjunctiva/sclera: Conjunctivae normal.  Cardiovascular:  Rate and Rhythm: Normal rate and regular rhythm.     Heart sounds: Normal heart sounds.  Pulmonary:     Effort: Pulmonary effort is normal.     Breath sounds: Normal breath sounds.  Musculoskeletal:     Cervical back: Normal range of motion and neck supple.  Skin:    General: Skin is warm and dry.  Neurological:     Mental Status: She is alert and oriented to person, place, and time.  Psychiatric:        Mood and Affect: Mood normal.        Behavior: Behavior normal.        Thought Content: Thought content normal.        Judgment: Judgment normal.       BP 116/74   Pulse 74   Temp (!) 97.3 F (36.3 C)   Ht 5\' 3"  (1.6 m)   Wt 131 lb (59.4 kg)   SpO2 98%   BMI 23.21 kg/m  Wt  Readings from Last 3 Encounters:  07/09/20 131 lb (59.4 kg)  07/01/20 128 lb 1.9 oz (58.1 kg)  06/27/20 128 lb 1.9 oz (58.1 kg)       Assessment & Plan:  1. Hospital discharge follow-up -Reviewed discharge summary.  Discussed hospital course, medications with patient and her daughter. -All questions answered -Appropriate follow-up appointments upcoming  2. Acute colitis -Improved symptoms.  Follow-up precautions reviewed with patient and daughter/fever, abdominal pain, increased bowel movements and/or diarrhea  -Follow-up in 2 months  This visit occurred during the SARS-CoV-2 public health emergency.  Safety protocols were in place, including screening questions prior to the visit, additional usage of staff PPE, and extensive cleaning of exam room while observing appropriate contact time as indicated for disinfecting solutions.      Clarene Reamer, FNP-BC  [facility]  07/09/2020 2:30 PM

## 2020-07-14 ENCOUNTER — Encounter: Payer: Self-pay | Admitting: Cardiology

## 2020-07-14 ENCOUNTER — Ambulatory Visit (INDEPENDENT_AMBULATORY_CARE_PROVIDER_SITE_OTHER): Payer: Medicare Other | Admitting: Cardiology

## 2020-07-14 ENCOUNTER — Other Ambulatory Visit: Payer: Self-pay

## 2020-07-14 VITALS — BP 100/60 | HR 78 | Ht 63.0 in | Wt 129.2 lb

## 2020-07-14 DIAGNOSIS — I951 Orthostatic hypotension: Secondary | ICD-10-CM | POA: Diagnosis not present

## 2020-07-14 DIAGNOSIS — R931 Abnormal findings on diagnostic imaging of heart and coronary circulation: Secondary | ICD-10-CM | POA: Diagnosis not present

## 2020-07-14 DIAGNOSIS — Z8679 Personal history of other diseases of the circulatory system: Secondary | ICD-10-CM

## 2020-07-14 MED ORDER — MIDODRINE HCL 2.5 MG PO TABS: 2.5000 mg | ORAL_TABLET | Freq: Three times a day (TID) | ORAL | 3 refills | Status: DC

## 2020-07-14 NOTE — Patient Instructions (Signed)
Medication Instructions:   Your physician has recommended you make the following change in your medication:   START taking Midodrine (Proamatrine): Take 1 tablet (2.5 mg total) by mouth 3 (three) times daily with meals.  *If you need a refill on your cardiac medications before your next appointment, please call your pharmacy*   Lab Work: None Ordered  If you have labs (blood work) drawn today and your tests are completely normal, you will receive your results only by: Marland Kitchen MyChart Message (if you have MyChart) OR . A paper copy in the mail If you have any lab test that is abnormal or we need to change your treatment, we will call you to review the results.   Testing/Procedures: None Ordered   Follow-Up: At [facility], you and your health needs are our priority.  As part of our continuing mission to provide you with exceptional heart care, we have created designated Provider Care Teams.  These Care Teams include your primary Cardiologist (physician) and Advanced Practice Providers (APPs -  Physician Assistants and Nurse Practitioners) who all work together to provide you with the care you need, when you need it.  We recommend signing up for the patient portal called "MyChart".  Sign up information is provided on this After Visit Summary.  MyChart is used to connect with patients for Virtual Visits (Telemedicine).  Patients are able to view lab/test results, encounter notes, upcoming appointments, etc.  Non-urgent messages can be sent to your provider as well.   To learn more about what you can do with MyChart, go to [email or web address].    Your next appointment:   1 month(s)  The format for your next appointment:   In Person  Provider:   Kate Sable, MD   Other Instructions  Midodrine tablets What is this medicine? MIDODRINE (MI doe dreen) is used to treat low blood pressure in patients who have symptoms like dizziness when going from a sitting to a standing  position. This medicine may be used for other purposes; ask your health care provider or pharmacist if you have questions. COMMON BRAND NAME(S): Orvaten, ProAmatine What should I tell my health care provider before I take this medicine? They need to know if you have any of the following conditions:  difficulty passing urine  heart disease  high blood pressure  kidney disease  over active thyroid  pheochromocytoma  an unusual or allergic reaction to midodrine, other medicines, foods, dyes, or preservatives  pregnant or trying to get pregnant  breast-feeding How should I use this medicine? Take this medicine by mouth with a glass of water. Follow the directions on the prescription label. The last dose of this medicine should not be taken after the evening meal or less than 4 hours before bedtime. When you lie down for any length of time after taking this medicine, high blood pressure can occur. Do not take this medicine if you will be lying down for any length of time. Do not take your medicine more often than directed. Do not stop taking except on your doctor's advice. Talk to your pediatrician regarding the use of this medicine in children. Special care may be needed. Overdosage: If you think you have taken too much of this medicine contact a poison control center or emergency room at once. NOTE: This medicine is only for you. Do not share this medicine with others. What if I miss a dose? If you miss a dose, take it as soon as you can. If it is  almost time for your next dose, take only that dose. Do not take double or extra doses. What may interact with this medicine? Do not take this medicine with any of the following medications:  MAOIs like Carbex, Eldepryl, Marplan, Nardil, and Parnate  medicines called ergot alkaloids  medicines for colds and breathing difficulties or weight loss  procarbazine This medicine may also interact with the following  medications:  cimetidine  digoxin  flecainide  fludrocortisone  metformin  procainamide  quinidine  ranitidine  triamterene  medicines called alpha-blockers like doxazosin, prazosin, and terazosin This list may not describe all possible interactions. Give your health care provider a list of all the medicines, herbs, non-prescription drugs, or dietary supplements you use. Also tell them if you smoke, drink alcohol, or use illegal drugs. Some items may interact with your medicine. What should I watch for while using this medicine? Visit your doctor or health care professional for regular checks on your progress. You may get drowsy or dizzy. Do not drive, use machinery, or do anything that needs mental alertness until you know how this medicine affects you. Do not stand or sit up quickly, especially if you are an older patient. This reduces the risk of dizzy or fainting spells. Your mouth may get dry. Chewing sugarless gum or sucking hard candy, and drinking plenty of water may help. Contact your doctor if the problem does not go away or is severe. Do not treat yourself for coughs, colds, or pain while you are taking this medicine without asking your doctor or health care professional for advice. Some ingredients may increase your blood pressure. What side effects may I notice from receiving this medicine? Side effects that you should report to your doctor or health care professional as soon as possible:  awareness of heart beating  blurred vision  headache  irregular heartbeat, palpitations, or chest pain  pounding in the ears  skin rash, hives Side effects that usually do not require medical attention (report to your doctor or health care professional if they continue or are bothersome):  change in heart rate  chills  goose bumps  increased need to urinate  itching  stomach pain  tingling in the skin or scalp This list may not describe all possible side effects.  Call your doctor for medical advice about side effects. You may report side effects to FDA at 1-800-FDA-1088. Where should I keep my medicine? Keep out of the reach of children. Store at room temperature between 15 and 30 degrees C (59 and 86 degrees F). Throw away any unused medicine after the expiration date. NOTE: This sheet is a summary. It may not cover all possible information. If you have questions about this medicine, talk to your doctor, pharmacist, or health care provider.  2020 [employer]vier/Gold Standard (2008-07-01 13:51:24)

## 2020-07-14 NOTE — Progress Notes (Signed)
Cardiology Office Note:    Date:  07/14/2020   ID:  Regina Hernandez, DOB 1934-08-07, MRN [id number]  PCP:  Elby Beck, FNP  Cardiologist:  Kate Sable, MD  Electrophysiologist:  None   Referring MD: Elby Beck, FNP   Chief Complaint  Patient presents with   other    Follow up from [facility]; syncope. Meds reviewed by the pt. verbally. When patient standing on the scale for weight, she was not answering me for a few seconds as if she blanked out.     History of Present Illness:    Regina Hernandez is a 86 y.o. female with a hx of anxiety, bradycardia, sick sinus syndrome status post permanent pacemaker (medtronic in 2013) who presents for follow-up.  Patient recently admitted to the hospital due to weakness, diarrhea and dizziness.  Diagnosed with sepsis likely from colitis.  Managed with IV fluids and antibiotics.  Since starting the antibiotics, she has felt much better as per the daughter in the room today.  She has more energy.  However, during weight check in the office today, patient felt dizzy while standing.   Patient was previously admitted to the hospital in June 2021 due to bloody stools, diagnosed with peptic ulcer disease, received 2 units of packed red blood cells.  Aspirin was stopped.  Has follow-up with gastroenterology.  Historical notes She was previously seen at [facility]ology in [place].  Patient recently moved to the area from [place].  She denies any symptoms of chest pain or shortness of breath at rest or with exertion.  She is able to do all activities of daily living without chest pain or breathing problems.  She ambulates with a walker.  She states having a history of cardiac valve leakage.  Her pacemaker was placed roughly 7 years ago.  Left heart cath in 2012 at [place] showed minimal irregularities in the LAD and RCA.  Pacemaker lastly interrogated on 01/16/2020, report states pacemaker was functioning appropriately.  Past  Medical History:  Diagnosis Date   Anxiety    Cervical dystonia    Essential tremor    GI bleed    Memory loss    Migraine    Occipital neuralgia     Past Surgical History:  Procedure Laterality Date   BOWEL RESECTION     CARDIAC CATHETERIZATION  2016   no stents/[facility]   ESOPHAGOGASTRODUODENOSCOPY N/A 06/18/2020   Procedure: ESOPHAGOGASTRODUODENOSCOPY (EGD);  Surgeon: Toledo, Benay Pike, MD;  Location: [facility] ENDOSCOPY;  Service: Gastroenterology;  Laterality: N/A;   PACEMAKER IMPLANT     REPLACEMENT TOTAL KNEE Left     Current Medications: Current Meds  Medication Sig   acetaminophen (TYLENOL) 500 MG tablet Take 2 tablets (1,000 mg total) by mouth every 8 (eight) hours as needed for mild pain or moderate pain.   Cholecalciferol (VITAMIN D3 PO) Take 25 mcg by mouth daily.   Docusate Calcium (STOOL SOFTENER PO) Take 1 capsule by mouth in the morning and at bedtime.    estradiol (ESTRACE) 0.1 MG/GM vaginal cream Place 1 Applicatorful vaginally as needed.   fluvoxaMINE (LUVOX) 50 MG tablet Take 50 mg by mouth 2 (two) times daily.   gabapentin (NEURONTIN) 400 MG capsule Take 1 capsule (400 mg total) by mouth 3 (three) times daily.   memantine (NAMENDA) 10 MG tablet TAKE 1 TABLET BY MOUTH TWICE A DAY (Patient taking differently: Take 10 mg by mouth 2 (two) times daily. )   pantoprazole (PROTONIX) 40 MG  tablet Take 1 tablet (40 mg total) by mouth 2 (two) times daily.   primidone (MYSOLINE) 50 MG tablet Take 2 tablets (100 mg total) by mouth at bedtime.   risperiDONE (RISPERDAL) 2 MG tablet Take 1 mg by mouth at bedtime.    topiramate (TOPAMAX) 50 MG tablet Take 50 mg by mouth 3 (three) times daily.   venlafaxine XR (EFFEXOR-XR) 75 MG 24 hr capsule Take 75 mg by mouth daily.   vitamin B-12 (CYANOCOBALAMIN) 1000 MCG tablet Take 1,000 mcg by mouth daily.   [DISCONTINUED] aspirin (ASPIRIN 81) 81 MG EC tablet Take 1 tablet by mouth daily.       Allergies:   Adhesive [tape], Baclofen, Lyrica [pregabalin], and Sulfa antibiotics   Social History   Socioeconomic History   Marital status: Widowed    Spouse name: Not on file   Number of children: 5   Years of education: 12th   Highest education level: High school graduate  Occupational History   Occupation: Retired  Tobacco Use   Smoking status: Never Smoker   Smokeless tobacco: Never Used  Scientific laboratory technician Use: Never used  Substance and Sexual Activity   Alcohol use: Not Currently   Drug use: Never   Sexual activity: Not on file  Other Topics Concern   Not on file  Social History Narrative   Lives at home with her daughters.   Right-handed.   Two cups caffeine per day.   Social Determinants of Health   Financial Resource Strain:    Difficulty of Paying Living Expenses:   Food Insecurity:    Worried About Charity fundraiser in the Last Year:    Arboriculturist in the Last Year:   Transportation Needs:    Film/video editor (Medical):    Lack of Transportation (Non-Medical):   Physical Activity:    Days of Exercise per Week:    Minutes of Exercise per Session:   Stress:    Feeling of Stress :   Social Connections:    Frequency of Communication with Friends and Family:    Frequency of Social Gatherings with Friends and Family:    Attends Religious Services:    Active Member of Clubs or Organizations:    Attends Archivist Meetings:    Marital Status:      Family History: The patient's family history includes Other in her mother; Stomach cancer in her brother; Ulcers in her father.  ROS:   Please see the history of present illness.     All other systems reviewed and are negative.  EKGs/Labs/Other Studies Reviewed:    The following studies were reviewed today:   EKG:  EKG is  ordered today.  The ekg ordered today demonstrates normal sinus rhythm.  Recent Labs: 06/22/2020: TSH 2.548 07/01/2020: ALT 13; B  Natriuretic Peptide 332.3 07/04/2020: BUN 15; Creatinine, Ser 0.79; Hemoglobin 10.0; Magnesium 2.1; Platelets 160; Potassium 3.6; Sodium 138  Recent Lipid Panel No results found for: CHOL, TRIG, HDL, CHOLHDL, VLDL, LDLCALC, LDLDIRECT  Physical Exam:    VS:  BP 100/60 (BP Location: Left Arm, Patient Position: Sitting, Cuff Size: Normal)    Pulse 78    Ht 5\' 3"  (1.6 m)    Wt 129 lb 4 oz (58.6 kg)    SpO2 97%    BMI 22.90 kg/m     Wt Readings from Last 3 Encounters:  07/14/20 129 lb 4 oz (58.6 kg)  07/09/20 131 lb (59.4 kg)  07/01/20 128 lb 1.9 oz (58.1 kg)     GEN:  Well nourished, well developed in no acute distress HEENT: Normal NECK: No JVD; No carotid bruits LYMPHATICS: No lymphadenopathy CARDIAC: RRR, no murmurs, rubs, gallops RESPIRATORY:  Clear to auscultation without rales, wheezing or rhonchi  ABDOMEN: Soft, non-tender, non-distended MUSCULOSKELETAL:  No edema; No deformity  SKIN: Warm and dry NEUROLOGIC:  Alert and oriented x 3 PSYCHIATRIC:  Normal affect   ASSESSMENT:    1. Orthostatic hypotension   2. Abnormal echocardiogram   3. History of sick sinus syndrome    PLAN:    In order of problems listed above:  1. Patient with dizziness, orthostatic vitals in the office today consistent with orthostasis.  Start midodrine 2.5 mg 3 times daily. fluid and salt liberalization also advised. 2. Patient with history of mitral regurgitation, repeat echocardiogram on 05/2020 reviewed by myself shows normal systolic and diastolic function, EF of 54-27%.  No mitral regurgitation noted.  No mitral regurg noted. 3. Patient with history of bradycardia, sick sinus syndrome status post pacemaker.  Continue appointments with device clinic for frequent pacemaker checks.   . Follow-up in 1 month  This note was generated in part or whole with voice recognition software. Voice recognition is usually quite accurate but there are transcription errors that can and very often do occur. I  apologize for any typographical errors that were not detected and corrected.  Medication Adjustments/Labs and Tests Ordered: Current medicines are reviewed at length with the patient today.  Concerns regarding medicines are outlined above.  Orders Placed This Encounter  Procedures   EKG 12-Lead   Meds ordered this encounter  Medications   midodrine (PROAMATINE) 2.5 MG tablet    Sig: Take 1 tablet (2.5 mg total) by mouth 3 (three) times daily with meals.    Dispense:  90 tablet    Refill:  3    Patient Instructions  Medication Instructions:   Your physician has recommended you make the following change in your medication:   START taking Midodrine (Proamatrine): Take 1 tablet (2.5 mg total) by mouth 3 (three) times daily with meals.  *If you need a refill on your cardiac medications before your next appointment, please call your pharmacy*   Lab Work: None Ordered  If you have labs (blood work) drawn today and your tests are completely normal, you will receive your results only by:  [place] (if you have MyChart) OR  A paper copy in the mail If you have any lab test that is abnormal or we need to change your treatment, we will call you to review the results.   Testing/Procedures: None Ordered   Follow-Up: At [facility], you and your health needs are our priority.  As part of our continuing mission to provide you with exceptional heart care, we have created designated Provider Care Teams.  These Care Teams include your primary Cardiologist (physician) and Advanced Practice Providers (APPs -  Physician Assistants and Nurse Practitioners) who all work together to provide you with the care you need, when you need it.  We recommend signing up for the patient portal called "MyChart".  Sign up information is provided on this After Visit Summary.  MyChart is used to connect with patients for Virtual Visits (Telemedicine).  Patients are able to view lab/test results,  encounter notes, upcoming appointments, etc.  Non-urgent messages can be sent to your provider as well.   To learn more about what you can do with MyChart, go  to [email or web address].    Your next appointment:   1 month(s)  The format for your next appointment:   In Person  Provider:   Kate Sable, MD   Other Instructions  Midodrine tablets What is this medicine? MIDODRINE (MI doe dreen) is used to treat low blood pressure in patients who have symptoms like dizziness when going from a sitting to a standing position. This medicine may be used for other purposes; ask your health care provider or pharmacist if you have questions. COMMON BRAND NAME(S): Orvaten, ProAmatine What should I tell my health care provider before I take this medicine? They need to know if you have any of the following conditions:  difficulty passing urine  heart disease  high blood pressure  kidney disease  over active thyroid  pheochromocytoma  an unusual or allergic reaction to midodrine, other medicines, foods, dyes, or preservatives  pregnant or trying to get pregnant  breast-feeding How should I use this medicine? Take this medicine by mouth with a glass of water. Follow the directions on the prescription label. The last dose of this medicine should not be taken after the evening meal or less than 4 hours before bedtime. When you lie down for any length of time after taking this medicine, high blood pressure can occur. Do not take this medicine if you will be lying down for any length of time. Do not take your medicine more often than directed. Do not stop taking except on your doctor's advice. Talk to your pediatrician regarding the use of this medicine in children. Special care may be needed. Overdosage: If you think you have taken too much of this medicine contact a poison control center or emergency room at once. NOTE: This medicine is only for you. Do not share this medicine with  others. What if I miss a dose? If you miss a dose, take it as soon as you can. If it is almost time for your next dose, take only that dose. Do not take double or extra doses. What may interact with this medicine? Do not take this medicine with any of the following medications:  MAOIs like Carbex, Eldepryl, Marplan, Nardil, and Parnate  medicines called ergot alkaloids  medicines for colds and breathing difficulties or weight loss  procarbazine This medicine may also interact with the following medications:  cimetidine  digoxin  flecainide  fludrocortisone  metformin  procainamide  quinidine  ranitidine  triamterene  medicines called alpha-blockers like doxazosin, prazosin, and terazosin This list may not describe all possible interactions. Give your health care provider a list of all the medicines, herbs, non-prescription drugs, or dietary supplements you use. Also tell them if you smoke, drink alcohol, or use illegal drugs. Some items may interact with your medicine. What should I watch for while using this medicine? Visit your doctor or health care professional for regular checks on your progress. You may get drowsy or dizzy. Do not drive, use machinery, or do anything that needs mental alertness until you know how this medicine affects you. Do not stand or sit up quickly, especially if you are an older patient. This reduces the risk of dizzy or fainting spells. Your mouth may get dry. Chewing sugarless gum or sucking hard candy, and drinking plenty of water may help. Contact your doctor if the problem does not go away or is severe. Do not treat yourself for coughs, colds, or pain while you are taking this medicine without asking your doctor or health care professional for  advice. Some ingredients may increase your blood pressure. What side effects may I notice from receiving this medicine? Side effects that you should report to your doctor or health care professional as  soon as possible:  awareness of heart beating  blurred vision  headache  irregular heartbeat, palpitations, or chest pain  pounding in the ears  skin rash, hives Side effects that usually do not require medical attention (report to your doctor or health care professional if they continue or are bothersome):  change in heart rate  chills  goose bumps  increased need to urinate  itching  stomach pain  tingling in the skin or scalp This list may not describe all possible side effects. Call your doctor for medical advice about side effects. You may report side effects to FDA at 1-800-FDA-1088. Where should I keep my medicine? Keep out of the reach of children. Store at room temperature between 15 and 30 degrees C (59 and 86 degrees F). Throw away any unused medicine after the expiration date. NOTE: This sheet is a summary. It may not cover all possible information. If you have questions about this medicine, talk to your doctor, pharmacist, or health care provider.  2020 Elsevier/Gold Standard (2008-07-01 13:51:24)      Signed, Kate Sable, MD  07/14/2020 12:24 PM    [facility]

## 2020-07-18 ENCOUNTER — Other Ambulatory Visit: Payer: Self-pay

## 2020-07-18 ENCOUNTER — Encounter: Payer: Self-pay | Admitting: Urology

## 2020-07-18 ENCOUNTER — Ambulatory Visit (INDEPENDENT_AMBULATORY_CARE_PROVIDER_SITE_OTHER): Payer: Medicare Other | Admitting: Urology

## 2020-07-18 VITALS — BP 101/69 | HR 85 | Ht 63.0 in | Wt 128.7 lb

## 2020-07-18 DIAGNOSIS — R3129 Other microscopic hematuria: Secondary | ICD-10-CM

## 2020-07-18 DIAGNOSIS — R3915 Urgency of urination: Secondary | ICD-10-CM | POA: Diagnosis not present

## 2020-07-18 DIAGNOSIS — R8271 Bacteriuria: Secondary | ICD-10-CM

## 2020-07-18 LAB — URINALYSIS, COMPLETE
Bilirubin, UA: NEGATIVE
Glucose, UA: NEGATIVE
Ketones, UA: NEGATIVE
Nitrite, UA: POSITIVE — AB
Protein,UA: NEGATIVE
Specific Gravity, UA: 1.015 (ref 1.005–1.030)
Urobilinogen, Ur: 0.2 mg/dL (ref 0.2–1.0)
pH, UA: 6 (ref 5.0–7.5)

## 2020-07-18 LAB — MICROSCOPIC EXAMINATION: WBC, UA: >30 /hpf — AB (ref 0–5)

## 2020-07-18 NOTE — Patient Instructions (Signed)
Asymptomatic Bacteriuria  Asymptomatic bacteriuria is the presence of a large number of bacteria in the urine without the usual symptoms of burning or frequent urination. What are the causes? This condition is caused by an increase in bacteria in the urine. This increase can be caused by:  Bacteria entering the urinary tract, such as during sex.  A blockage in the urinary tract, such as from kidney stones or a tumor.  Bladder problems that prevent the bladder from emptying.  Common after menopause What increases the risk? You are more likely to develop this condition if:  You have diabetes mellitus.  You are an elderly adult, especially if you are also in a long-term care facility.  You are pregnant and in the first trimester.  You have kidney stones.  You are female.  You have had a kidney transplant.  You have a leaky kidney tube valve (reflux).  You had a urinary catheter for a long period of time. What are the signs or symptoms? There are no symptoms of this condition. How is this diagnosed? This condition is diagnosed with a urine test. Because this condition does not cause symptoms, it is usually diagnosed when a urine sample is taken to treat or diagnose another condition, such as pregnancy or kidney problems. Most women who are in their first trimester of pregnancy are screened for asymptomatic bacteriuria. How is this treated? Usually, treatment is not needed for this condition. Treating the condition can lead to other problems, such as a yeast infection or the growth of bacteria that do not respond to treatment (antibiotic-resistant bacteria). Some people, such as pregnant women and people with kidney transplants, do need treatment with antibiotic medicines to prevent kidney infection (pyelonephritis). In pregnant women, kidney infection can lead to premature labor, fetal growth restriction, or newborn death. Follow these instructions at home: Medicines  Take  over-the-counter and prescription medicines only as told by your health care provider.  If you were prescribed an antibiotic medicine, take it as told by your health care provider. Do not stop taking the antibiotic even if you start to feel better. General instructions  Monitor your condition for any changes.  Drink enough fluid to keep your urine clear or pale yellow.  Go to the bathroom more often to keep your bladder empty.  If you are female, keep the area around your vagina and rectum clean. Wipe yourself from front to back after urinating.  Keep all follow-up visits as told by your health care provider. This is important. Contact a health care provider if:  You notice any new symptoms, such as back pain or burning while urinating. Get help right away if:  You develop signs of an infection such as: ? A burning sensation when you urinate. ? Have pain when you urinate. ? Develop an intense need to urinate. ? Urinating more frequently. ? Back pain or pelvic pain. ? Fever or chills.  You have blood in your urine.  Your urine becomes discolored or cloudy.  Your urine smells bad.  You have severe pain that cannot be controlled with medicine. Summary  Asymptomatic bacteriuria is the presence of a large number of bacteria in the urine without the usual symptoms of burning or frequent urination.  Usually, treatment is not needed for this condition. Treating the condition can lead to other problems, such as too much yeast and the growth of antibiotic-resistant bacteria.  Some people, such as pregnant women and people with kidney transplants, do need treatment with antibiotic   medicines to prevent kidney infection (pyelonephritis).  If you were prescribed an antibiotic medicine, take it as told by your health care provider. Do not stop taking the antibiotic even if you start to feel better. This information is not intended to replace advice given to you by your health care  provider. Make sure you discuss any questions you have with your health care provider. Document Revised: 04/03/2019 Document Reviewed: 12/07/2016 Elsevier Patient Education  2020 Elsevier Inc.  

## 2020-07-18 NOTE — Progress Notes (Signed)
07/18/2020 1:20 PM   Regina Hernandez 08/30/1934 [id number]  Referring provider: Elby Beck, [place],  [place] [address]  Chief Complaint  Patient presents with  . Follow-up    HPI:  Regina Hernandez was referred for UTI some of which sound like asymptomatic bacteriuria. She "has no symptoms". She was recently tx with abx and developed c diff. Abx and vancomycin are complete. Urine culture was negative 07/04/2020.  A June 2021 culture grew E. coli.  A December 2020 culture grew E. Coli. She's been in and out of hospital.   She voids with a good stream. PVR was normal. She drinks a lot water. She has urgency and mild UUI. NG risk includes memory loss. She has a hysterectomy. Bowels regular.   UA today with many bacteria - pt without dysuria or gross hematuria. She does have 3-10 rbc per hpf. Cr 0.79 07/21.   She saw Dr. Marily Memos in [place]. She reports MH and cysto in the past. I will check CE.   PMH: Past Medical History:  Diagnosis Date  . Anxiety   . Cervical dystonia   . Essential tremor   . GI bleed   . Memory loss   . Migraine   . Occipital neuralgia     Surgical History: Past Surgical History:  Procedure Laterality Date  . BOWEL RESECTION    . CARDIAC CATHETERIZATION  2016   no stents/[facility]  . ESOPHAGOGASTRODUODENOSCOPY N/A 06/18/2020   Procedure: ESOPHAGOGASTRODUODENOSCOPY (EGD);  Surgeon: Toledo, Benay Pike, MD;  Location: [facility] ENDOSCOPY;  Service: Gastroenterology;  Laterality: N/A;  . PACEMAKER IMPLANT    . REPLACEMENT TOTAL KNEE Left     Home Medications:  Allergies as of 07/18/2020      Reactions   Adhesive [tape] Other (See Comments)   Contact dermatitis   Baclofen Rash   Lyrica [pregabalin] Other (See Comments)   Unable to remember reaction.   Sulfa Antibiotics Rash      Medication List       Accurate as of July 18, 2020  1:20 PM. If you have any questions, ask your nurse or doctor.          acetaminophen 500 MG tablet Commonly known as: TYLENOL Take 2 tablets (1,000 mg total) by mouth every 8 (eight) hours as needed for mild pain or moderate pain.   estradiol 0.1 MG/GM vaginal cream Commonly known as: ESTRACE Place 1 Applicatorful vaginally as needed.   fluvoxaMINE 50 MG tablet Commonly known as: LUVOX Take 50 mg by mouth 2 (two) times daily.   gabapentin 400 MG capsule Commonly known as: NEURONTIN Take 1 capsule (400 mg total) by mouth 3 (three) times daily.   memantine 10 MG tablet Commonly known as: NAMENDA TAKE 1 TABLET BY MOUTH TWICE A DAY   midodrine 2.5 MG tablet Commonly known as: PROAMATINE Take 1 tablet (2.5 mg total) by mouth 3 (three) times daily with meals.   pantoprazole 40 MG tablet Commonly known as: PROTONIX Take 1 tablet (40 mg total) by mouth 2 (two) times daily.   primidone 50 MG tablet Commonly known as: MYSOLINE Take 2 tablets (100 mg total) by mouth at bedtime.   risperiDONE 2 MG tablet Commonly known as: RISPERDAL Take 1 mg by mouth at bedtime.   STOOL SOFTENER PO Take 1 capsule by mouth in the morning and at bedtime.   topiramate 50 MG tablet Commonly known as: TOPAMAX Take 50 mg by mouth 3 (three) times  daily.   venlafaxine XR 75 MG 24 hr capsule Commonly known as: EFFEXOR-XR Take 75 mg by mouth daily.   vitamin B-12 1000 MCG tablet Commonly known as: CYANOCOBALAMIN Take 1,000 mcg by mouth daily.   VITAMIN D3 PO Take 25 mcg by mouth daily.       Allergies:  Allergies  Allergen Reactions  . Adhesive [Tape] Other (See Comments)    Contact dermatitis  . Baclofen Rash  . Lyrica [Pregabalin] Other (See Comments)    Unable to remember reaction.  . Sulfa Antibiotics Rash    Family History: Family History  Problem Relation Age of Onset  . Other Mother        "old age"  . Ulcers Father   . Stomach cancer Brother     Social History:  reports that she has never smoked. She has never used smokeless tobacco. She  reports previous alcohol use. She reports that she does not use drugs.   Physical Exam: BP 101/69   Pulse 85   Ht 5\' 3"  (1.6 m)   Wt 128 lb 11.2 oz (58.4 kg)   BMI 22.80 kg/m   Constitutional:  Alert and oriented, No acute distress. HEENT: [place] AT, moist mucus membranes.  Trachea midline, no masses. Cardiovascular: No clubbing, cyanosis, or edema. Respiratory: Normal respiratory effort, no increased work of breathing. GI: Abdomen is soft, nontender, nondistended, no abdominal masses GU: No CVA tenderness Skin: No rashes, bruises or suspicious lesions. Neurologic: Grossly intact, no focal deficits, moving all 4 extremities. Psychiatric: Normal mood and affect.  Laboratory Data: Lab Results  Component Value Date   WBC 6.8 07/04/2020   HGB 10.0 (L) 07/04/2020   HCT 29.8 (L) 07/04/2020   MCV 94.3 07/04/2020   PLT 160 07/04/2020    Lab Results  Component Value Date   CREATININE 0.79 07/04/2020    No results found for: PSA  No results found for: TESTOSTERONE  No results found for: HGBA1C  Urinalysis    Component Value Date/Time   COLORURINE YELLOW (A) 07/01/2020 1146   APPEARANCEUR HAZY (A) 07/01/2020 1146   APPEARANCEUR Hazy (A) 02/29/2020 0927   LABSPEC 1.021 07/01/2020 1146   PHURINE 5.0 07/01/2020 1146   GLUCOSEU 50 (A) 07/01/2020 1146   HGBUR SMALL (A) 07/01/2020 1146   BILIRUBINUR NEGATIVE 07/01/2020 1146   BILIRUBINUR Negative 02/29/2020 0927   KETONESUR NEGATIVE 07/01/2020 1146   PROTEINUR 30 (A) 07/01/2020 1146   NITRITE NEGATIVE 07/01/2020 1146   LEUKOCYTESUR NEGATIVE 07/01/2020 1146    Lab Results  Component Value Date   LABMICR See below: 02/29/2020   WBCUA 0-5 02/29/2020   LABEPIT 0-10 02/29/2020   BACTERIA NONE SEEN 07/01/2020    Pertinent Imaging: n/a No results found for this or any previous visit.  No results found for this or any previous visit.  No results found for this or any previous visit.  No results found for this or any  previous visit.  No results found for this or any previous visit.  No results found for this or any previous visit.  No results found for this or any previous visit.  No results found for this or any previous visit.   Assessment & Plan:    1. Asymptomatic bacteriuria - currently she's asymptomatic which is good.  We want her to reequilibrate off antibiotics. She doesn't need abx at this pt and they risk c diff, resistance and wont prevent future symptomatic UTI, etc.  We also discussed cloudy urine and foul-smelling urine  or signs of bacteria but not infection and it is important in those times to really increase water intake. - Urinalysis, Complete - Bladder Scan (Post Void Residual) in office  2. Urgency - continue to monitor. Discssed OAB meds but not bothered.   3. MH - sounds like she has been evaluated, but will screen kidneys with [place] and see back in 6 - 8 weeks to check symptoms.   No follow-ups on file.  Festus Aloe, MD  [facility] [address] [place], [place] [address] [phone]

## 2020-07-21 ENCOUNTER — Inpatient Hospital Stay
Admission: EM | Admit: 2020-07-21 | Discharge: 2020-07-24 | DRG: 372 | Disposition: A | Discharge status: Home / Self Care | Payer: Medicare Other | Attending: Internal Medicine | Admitting: Internal Medicine

## 2020-07-21 ENCOUNTER — Other Ambulatory Visit: Payer: Self-pay

## 2020-07-21 ENCOUNTER — Emergency Department: Payer: Medicare Other

## 2020-07-21 ENCOUNTER — Telehealth: Payer: Self-pay

## 2020-07-21 DIAGNOSIS — Z20822 Contact with and (suspected) exposure to covid-19: Secondary | ICD-10-CM | POA: Diagnosis present

## 2020-07-21 DIAGNOSIS — Z950 Presence of cardiac pacemaker: Secondary | ICD-10-CM | POA: Diagnosis not present

## 2020-07-21 DIAGNOSIS — F419 Anxiety disorder, unspecified: Secondary | ICD-10-CM | POA: Diagnosis present

## 2020-07-21 DIAGNOSIS — Z882 Allergy status to sulfonamides status: Secondary | ICD-10-CM | POA: Diagnosis not present

## 2020-07-21 DIAGNOSIS — E440 Moderate protein-calorie malnutrition: Secondary | ICD-10-CM | POA: Diagnosis present

## 2020-07-21 DIAGNOSIS — F32A Depression, unspecified: Secondary | ICD-10-CM | POA: Diagnosis present

## 2020-07-21 DIAGNOSIS — R531 Weakness: Secondary | ICD-10-CM | POA: Diagnosis present

## 2020-07-21 DIAGNOSIS — F0390 Unspecified dementia without behavioral disturbance: Secondary | ICD-10-CM | POA: Diagnosis present

## 2020-07-21 DIAGNOSIS — F329 Major depressive disorder, single episode, unspecified: Secondary | ICD-10-CM | POA: Diagnosis present

## 2020-07-21 DIAGNOSIS — I951 Orthostatic hypotension: Secondary | ICD-10-CM | POA: Diagnosis present

## 2020-07-21 DIAGNOSIS — R55 Syncope and collapse: Secondary | ICD-10-CM | POA: Diagnosis present

## 2020-07-21 DIAGNOSIS — A0472 Enterocolitis due to Clostridium difficile, not specified as recurrent: Secondary | ICD-10-CM | POA: Diagnosis not present

## 2020-07-21 DIAGNOSIS — E876 Hypokalemia: Secondary | ICD-10-CM | POA: Diagnosis not present

## 2020-07-21 DIAGNOSIS — G250 Essential tremor: Secondary | ICD-10-CM | POA: Diagnosis present

## 2020-07-21 DIAGNOSIS — I495 Sick sinus syndrome: Secondary | ICD-10-CM | POA: Diagnosis present

## 2020-07-21 DIAGNOSIS — A0471 Enterocolitis due to Clostridium difficile, recurrent: Principal | ICD-10-CM | POA: Diagnosis present

## 2020-07-21 DIAGNOSIS — Z66 Do not resuscitate: Secondary | ICD-10-CM | POA: Diagnosis present

## 2020-07-21 DIAGNOSIS — E869 Volume depletion, unspecified: Secondary | ICD-10-CM | POA: Diagnosis present

## 2020-07-21 DIAGNOSIS — Z96652 Presence of left artificial knee joint: Secondary | ICD-10-CM | POA: Diagnosis present

## 2020-07-21 DIAGNOSIS — R197 Diarrhea, unspecified: Secondary | ICD-10-CM

## 2020-07-21 DIAGNOSIS — Z888 Allergy status to other drugs, medicaments and biological substances status: Secondary | ICD-10-CM | POA: Diagnosis not present

## 2020-07-21 DIAGNOSIS — Z79899 Other long term (current) drug therapy: Secondary | ICD-10-CM

## 2020-07-21 DIAGNOSIS — Z6821 Body mass index (BMI) 21.0-21.9, adult: Secondary | ICD-10-CM

## 2020-07-21 DIAGNOSIS — Z2239 Carrier of other specified bacterial diseases: Secondary | ICD-10-CM

## 2020-07-21 DIAGNOSIS — Z800 Family history of malignant neoplasm of digestive organs: Secondary | ICD-10-CM

## 2020-07-21 DIAGNOSIS — Z79890 Hormone replacement therapy (postmenopausal): Secondary | ICD-10-CM

## 2020-07-21 LAB — COMPREHENSIVE METABOLIC PANEL
ALT: 13 U/L (ref 0–44)
AST: 19 U/L (ref 15–41)
Albumin: 3.8 g/dL (ref 3.5–5.0)
Alkaline Phosphatase: 55 U/L (ref 38–126)
Anion gap: 8 (ref 5–15)
BUN: 18 mg/dL (ref 8–23)
CO2: 25 mmol/L (ref 22–32)
Calcium: 9.2 mg/dL (ref 8.9–10.3)
Chloride: 101 mmol/L (ref 98–111)
Creatinine, Ser: 0.99 mg/dL (ref 0.44–1.00)
GFR calc Af Amer: >60 mL/min (ref >60)
GFR calc non Af Amer: 52 mL/min — ABNORMAL LOW (ref >60)
Glucose, Bld: 105 mg/dL — ABNORMAL HIGH (ref 70–99)
Potassium: 3.5 mmol/L (ref 3.5–5.1)
Sodium: 134 mmol/L — ABNORMAL LOW (ref 135–145)
Total Bilirubin: 1 mg/dL (ref 0.3–1.2)
Total Protein: 6.8 g/dL (ref 6.5–8.1)

## 2020-07-21 LAB — URINALYSIS, COMPLETE (UACMP) WITH MICROSCOPIC
Bilirubin Urine: NEGATIVE
Glucose, UA: NEGATIVE mg/dL
Ketones, ur: NEGATIVE mg/dL
Nitrite: POSITIVE — AB
Protein, ur: NEGATIVE mg/dL
Specific Gravity, Urine: 1.017 (ref 1.005–1.030)
Squamous Epithelial / HPF: NONE SEEN (ref 0–5)
pH: 5 (ref 5.0–8.0)

## 2020-07-21 LAB — CBC WITH DIFFERENTIAL/PLATELET
Abs Immature Granulocytes: 0.06 10*3/uL (ref 0.00–0.07)
Basophils Absolute: 0.1 10*3/uL (ref 0.0–0.1)
Basophils Relative: 1 %
Eosinophils Absolute: 0 10*3/uL (ref 0.0–0.5)
Eosinophils Relative: 0 %
HCT: 37.1 % (ref 36.0–46.0)
Hemoglobin: 12.3 g/dL (ref 12.0–15.0)
Immature Granulocytes: 1 %
Lymphocytes Relative: 13 %
Lymphs Abs: 1.6 10*3/uL (ref 0.7–4.0)
MCH: 31 pg (ref 26.0–34.0)
MCHC: 33.2 g/dL (ref 30.0–36.0)
MCV: 93.5 fL (ref 80.0–100.0)
Monocytes Absolute: 0.8 10*3/uL (ref 0.1–1.0)
Monocytes Relative: 6 %
Neutro Abs: 9.9 10*3/uL — ABNORMAL HIGH (ref 1.7–7.7)
Neutrophils Relative %: 79 %
Platelets: 187 10*3/uL (ref 150–400)
RBC: 3.97 MIL/uL (ref 3.87–5.11)
RDW: 15.2 % (ref 11.5–15.5)
WBC: 12.5 10*3/uL — ABNORMAL HIGH (ref 4.0–10.5)
nRBC: 0 % (ref 0.0–0.2)

## 2020-07-21 LAB — GASTROINTESTINAL PANEL BY PCR, STOOL (REPLACES STOOL CULTURE)

## 2020-07-21 LAB — TROPONIN I (HIGH SENSITIVITY)
Troponin I (High Sensitivity): 11 ng/L (ref <18)
Troponin I (High Sensitivity): 9 ng/L (ref <18)

## 2020-07-21 LAB — SARS CORONAVIRUS 2 BY RT PCR (HOSPITAL ORDER, PERFORMED IN ~~LOC~~ HOSPITAL LAB): SARS Coronavirus 2: NEGATIVE

## 2020-07-21 MED ORDER — VENLAFAXINE HCL ER 75 MG PO CP24
75.0000 mg | ORAL_CAPSULE | Freq: Every day | ORAL | Status: DC
  Administered 2020-07-22 – 2020-07-24 (×3): 75 mg via ORAL
  Filled 2020-07-21 (×3): qty 1

## 2020-07-21 MED ORDER — TOPIRAMATE 100 MG PO TABS
50.0000 mg | ORAL_TABLET | Freq: Three times a day (TID) | ORAL | Status: DC
  Administered 2020-07-21 – 2020-07-24 (×8): 50 mg via ORAL
  Filled 2020-07-21: qty 0.5
  Filled 2020-07-21 (×8): qty 2
  Filled 2020-07-21 (×3): qty 0.5
  Filled 2020-07-21: qty 2

## 2020-07-21 MED ORDER — RISPERIDONE 1 MG PO TABS
1.0000 mg | ORAL_TABLET | Freq: Every day | ORAL | Status: DC
  Administered 2020-07-21 – 2020-07-23 (×3): 1 mg via ORAL
  Filled 2020-07-21 (×4): qty 1

## 2020-07-21 MED ORDER — MIDODRINE HCL 5 MG PO TABS
2.5000 mg | ORAL_TABLET | Freq: Three times a day (TID) | ORAL | Status: DC
  Administered 2020-07-22 – 2020-07-24 (×6): 2.5 mg via ORAL
  Filled 2020-07-21 (×10): qty 0.5

## 2020-07-21 MED ORDER — GABAPENTIN 400 MG PO CAPS
400.0000 mg | ORAL_CAPSULE | Freq: Three times a day (TID) | ORAL | Status: DC
  Administered 2020-07-21 – 2020-07-23 (×6): 400 mg via ORAL
  Filled 2020-07-21 (×8): qty 1

## 2020-07-21 MED ORDER — ACETAMINOPHEN 500 MG PO TABS: 1000.0000 mg | ORAL_TABLET | Freq: Three times a day (TID) | ORAL | Status: DC | PRN

## 2020-07-21 MED ORDER — MEMANTINE HCL 5 MG PO TABS
10.0000 mg | ORAL_TABLET | Freq: Two times a day (BID) | ORAL | Status: DC
  Administered 2020-07-21 – 2020-07-24 (×6): 10 mg via ORAL
  Filled 2020-07-21 (×6): qty 2

## 2020-07-21 MED ORDER — PRIMIDONE 50 MG PO TABS
100.0000 mg | ORAL_TABLET | Freq: Every day | ORAL | Status: DC
  Administered 2020-07-21 – 2020-07-23 (×3): 100 mg via ORAL
  Filled 2020-07-21 (×4): qty 2

## 2020-07-21 MED ORDER — SODIUM CHLORIDE 0.9 % IV SOLN: INTRAVENOUS | Status: DC

## 2020-07-21 MED ORDER — VITAMIN B-12 1000 MCG PO TABS
1000.0000 ug | ORAL_TABLET | Freq: Every day | ORAL | Status: DC
  Administered 2020-07-22 – 2020-07-24 (×3): 1000 ug via ORAL
  Filled 2020-07-21 (×3): qty 1

## 2020-07-21 MED ORDER — FIDAXOMICIN 200 MG PO TABS
200.0000 mg | ORAL_TABLET | Freq: Two times a day (BID) | ORAL | Status: DC
  Administered 2020-07-21 – 2020-07-24 (×8): 200 mg via ORAL
  Filled 2020-07-21 (×9): qty 1

## 2020-07-21 MED ORDER — ONDANSETRON HCL 4 MG PO TABS: 4.0000 mg | ORAL_TABLET | Freq: Four times a day (QID) | ORAL | Status: DC | PRN

## 2020-07-21 MED ORDER — VITAMIN D 25 MCG (1000 UNIT) PO TABS
25.0000 ug | ORAL_TABLET | Freq: Every day | ORAL | Status: DC
  Administered 2020-07-22 – 2020-07-24 (×3): 25 ug via ORAL
  Filled 2020-07-21 (×3): qty 1

## 2020-07-21 MED ORDER — ESTRADIOL 0.1 MG/GM VA CREA
1.0000 | TOPICAL_CREAM | VAGINAL | Status: DC
  Filled 2020-07-21: qty 42.5

## 2020-07-21 MED ORDER — LACTATED RINGERS IV BOLUS
1000.0000 mL | Freq: Once | INTRAVENOUS | Status: AC
  Administered 2020-07-21: 1000 mL via INTRAVENOUS

## 2020-07-21 MED ORDER — SODIUM CHLORIDE 0.9% FLUSH
3.0000 mL | Freq: Two times a day (BID) | INTRAVENOUS | Status: DC
  Administered 2020-07-22 – 2020-07-24 (×3): 3 mL via INTRAVENOUS

## 2020-07-21 MED ORDER — FLUVOXAMINE MALEATE 50 MG PO TABS
50.0000 mg | ORAL_TABLET | Freq: Two times a day (BID) | ORAL | Status: DC
  Administered 2020-07-21 – 2020-07-24 (×6): 50 mg via ORAL
  Filled 2020-07-21 (×7): qty 1

## 2020-07-21 MED ORDER — ONDANSETRON HCL 4 MG/2ML IJ SOLN: 4.0000 mg | Freq: Four times a day (QID) | INTRAMUSCULAR | Status: DC | PRN

## 2020-07-21 MED ORDER — ENOXAPARIN SODIUM 40 MG/0.4ML ~~LOC~~ SOLN
40.0000 mg | SUBCUTANEOUS | Status: DC
  Administered 2020-07-21 – 2020-07-23 (×3): 40 mg via SUBCUTANEOUS
  Filled 2020-07-21 (×4): qty 0.4

## 2020-07-21 NOTE — ED Notes (Signed)
Assisted pt to toilet, pt able to ambulate w/ one assist.

## 2020-07-21 NOTE — ED Notes (Signed)
Pt provided w/ dinner meal but declined. Pt stated "I don't want to be up all night"

## 2020-07-21 NOTE — ED Triage Notes (Signed)
Pt arrived via POV d/t AMS and recent Cdiff. Pt had an episode of syncope after being placed in ED room. Pt daughter reports pt behaving different from baseline and low blood pressure at home.

## 2020-07-21 NOTE — ED Notes (Signed)
Pt provided w lunch tray.

## 2020-07-21 NOTE — ED Notes (Signed)
Pt up to restroom with 1 assist.

## 2020-07-21 NOTE — ED Notes (Signed)
Updated pt's daughter [name].

## 2020-07-21 NOTE — ED Notes (Signed)
Patient transported to CT 

## 2020-07-21 NOTE — H&P (Signed)
History and Physical    Regina Hernandez YHC:[id number] DOB: [birth year] DOA: 07/21/2020  PCP: Elby Beck, FNP   Patient coming from: Home  I have personally briefly reviewed patient's old medical records in [place]  Chief Complaint: Diarrhea  HPI: Regina Hernandez is a 86 y.o. female with medical history significant for essential tremor, anxiety, orthostatic hypotension with syncopal episodes who presents to the emergency room for evaluation of a 3-day history of diarrhea.  Patient was treated for C. difficile diarrhea about 3 weeks ago and states that her stools have become formed but over the last 3 days she developed loose watery stools which she describes as foul-smelling and similar to her last episode of C. difficile.  The stool did not contain blood.  She denies recent antibiotic use.  She complained of dizziness and lightheadedness as well as weakness but denies having any nausea or vomiting.  She denies having any chest pain or shortness of breath.  She denies having any fever but has had chills.  While in the emergency room patient had a witnessed syncopal episode.  She had a systolic blood pressure in the 60s and received a liter of IV fluids with improvement in her blood pressure. Labs reveal a white count of 12,000 with left shift, sodium of 134 and potassium of 3.5. Chest x-ray reviewed by me shows clear lungs Twelve-lead EKG shows sinus rhythm    ED Course: Patient is an 86 year old female who was recently treated for C. difficile diarrhea presents to the emergency room with recurrent watery stools similar to her last C. difficile episode.  She had a witnessed syncopal episode in the emergency room and will be admitted to the hospital for further evaluation..  Review of Systems: As per HPI otherwise 10 point review of systems negative.    Past Medical History:  Diagnosis Date  . Anxiety   . Cervical dystonia   . Essential tremor   . GI bleed   . Memory loss    . Migraine   . Occipital neuralgia     Past Surgical History:  Procedure Laterality Date  . BOWEL RESECTION    . CARDIAC CATHETERIZATION  2016   no stents/[facility]  . ESOPHAGOGASTRODUODENOSCOPY N/A 06/18/2020   Procedure: ESOPHAGOGASTRODUODENOSCOPY (EGD);  Surgeon: Toledo, Benay Pike, MD;  Location: [facility] ENDOSCOPY;  Service: Gastroenterology;  Laterality: N/A;  . PACEMAKER IMPLANT    . REPLACEMENT TOTAL KNEE Left      reports that she has never smoked. She has never used smokeless tobacco. She reports previous alcohol use. She reports that she does not use drugs.  Allergies  Allergen Reactions  . Adhesive [Tape] Other (See Comments)    Contact dermatitis  . Baclofen Rash  . Lyrica [Pregabalin] Other (See Comments)    Unable to remember reaction.  . Sulfa Antibiotics Rash    Family History  Problem Relation Age of Onset  . Other Mother        "old age"  . Ulcers Father   . Stomach cancer Brother      Prior to Admission medications   Medication Sig Start Date End Date Taking? Authorizing Provider  Cholecalciferol (VITAMIN D3 PO) Take 25 mcg by mouth daily.   Yes [provider]  Docusate Calcium (STOOL SOFTENER PO) Take 1 capsule by mouth in the morning and at bedtime.    Yes [provider]  estradiol (ESTRACE) 0.1 MG/GM vaginal cream Place 1 Applicatorful vaginally as needed.   Yes  [provider]  fluvoxaMINE (LUVOX) 50 MG tablet Take 50 mg by mouth 2 (two) times daily.   Yes [provider]  gabapentin (NEURONTIN) 400 MG capsule Take 1 capsule (400 mg total) by mouth 3 (three) times daily. 04/22/20  Yes Suzzanne Cloud, NP  memantine (NAMENDA) 10 MG tablet TAKE 1 TABLET BY MOUTH TWICE A DAY Patient taking differently: Take 10 mg by mouth 2 (two) times daily.  02/26/20  Yes Marcial Pacas, MD  midodrine (PROAMATINE) 2.5 MG tablet Take 1 tablet (2.5 mg total) by mouth 3 (three) times daily with meals. 07/14/20  Yes Agbor-Etang,  Aaron Edelman, MD  pantoprazole (PROTONIX) 40 MG tablet Take 1 tablet (40 mg total) by mouth 2 (two) times daily. 06/19/20 07/21/20 Yes Barb Merino, MD  primidone (MYSOLINE) 50 MG tablet Take 2 tablets (100 mg total) by mouth at bedtime. 04/22/20  Yes Suzzanne Cloud, NP  risperiDONE (RISPERDAL) 1 MG tablet Take 1 mg by mouth at bedtime. 07/17/20  Yes [provider]  sucralfate (CARAFATE) 1 g tablet Take 1 g by mouth 4 (four) times daily as needed. 07/01/20  Yes [provider]  topiramate (TOPAMAX) 50 MG tablet Take 50 mg by mouth 3 (three) times daily. 05/12/20  Yes [provider]  venlafaxine XR (EFFEXOR-XR) 75 MG 24 hr capsule Take 75 mg by mouth daily. 06/28/20  Yes [provider]  vitamin B-12 (CYANOCOBALAMIN) 1000 MCG tablet Take 1,000 mcg by mouth daily.   Yes [provider]  acetaminophen (TYLENOL) 500 MG tablet Take 2 tablets (1,000 mg total) by mouth every 8 (eight) hours as needed for mild pain or moderate pain. 06/19/20   Barb Merino, MD    Physical Exam: Vitals:   07/21/20 1047 07/21/20 1104 07/21/20 1200 07/21/20 1230  BP: (!) 68/50  (!) 139/68 (!) 151/97  Pulse: 93  69 76  Resp: 16  18 18   Temp: 98.4 F (36.9 C)     TempSrc: Oral     SpO2: 96%  100% 98%  Weight:  54.4 kg    Height:  5\' 4"  (1.626 m)       Vitals:   07/21/20 1047 07/21/20 1104 07/21/20 1200 07/21/20 1230  BP: (!) 68/50  (!) 139/68 (!) 151/97  Pulse: 93  69 76  Resp: 16  18 18   Temp: 98.4 F (36.9 C)     TempSrc: Oral     SpO2: 96%  100% 98%  Weight:  54.4 kg    Height:  5\' 4"  (1.626 m)      Constitutional: NAD, alert and oriented x 3 Eyes: PERRL, lids and conjunctivae normal ENMT: Mucous membranes are dry  Neck: normal, supple, no masses, no thyromegaly Respiratory: clear to auscultation bilaterally, no wheezing, no crackles. Normal respiratory effort. No accessory muscle use.  Cardiovascular: Regular rate and rhythm, no murmurs / rubs / gallops. No  extremity edema. 2+ pedal pulses. No carotid bruits.  Abdomen: no tenderness, no masses palpated. No hepatosplenomegaly. Bowel sounds positive.  Musculoskeletal: no clubbing / cyanosis. No joint deformity upper and lower extremities.  Skin: no rashes, lesions, ulcers.  Neurologic: No gross focal neurologic deficit. Psychiatric: Normal mood and affect.   Labs on Admission: I have personally reviewed following labs and imaging studies  CBC: Recent Labs  Lab 07/21/20 1056  WBC 12.5*  NEUTROABS 9.9*  HGB 12.3  HCT 37.1  MCV 93.5  PLT 660   Basic Metabolic Panel: Recent Labs  Lab 07/21/20 1056  NA 134*  K 3.5  CL 101  CO2 25  GLUCOSE 105*  BUN 18  CREATININE 0.99  CALCIUM 9.2   GFR: Estimated Creatinine Clearance: 36.3 mL/min (by C-G formula based on SCr of 0.99 mg/dL). Liver Function Tests: Recent Labs  Lab 07/21/20 1056  AST 19  ALT 13  ALKPHOS 55  BILITOT 1.0  PROT 6.8  ALBUMIN 3.8   No results for input(s): LIPASE, AMYLASE in the last 168 hours. No results for input(s): AMMONIA in the last 168 hours. Coagulation Profile: No results for input(s): INR, PROTIME in the last 168 hours. Cardiac Enzymes: No results for input(s): CKTOTAL, CKMB, CKMBINDEX, TROPONINI in the last 168 hours. BNP (last 3 results) No results for input(s): PROBNP in the last 8760 hours. HbA1C: No results for input(s): HGBA1C in the last 72 hours. CBG: No results for input(s): GLUCAP in the last 168 hours. Lipid Profile: No results for input(s): CHOL, HDL, LDLCALC, TRIG, CHOLHDL, LDLDIRECT in the last 72 hours. Thyroid Function Tests: No results for input(s): TSH, T4TOTAL, FREET4, T3FREE, THYROIDAB in the last 72 hours. Anemia Panel: No results for input(s): VITAMINB12, FOLATE, FERRITIN, TIBC, IRON, RETICCTPCT in the last 72 hours. Urine analysis:    Component Value Date/Time   COLORURINE YELLOW (A) 07/01/2020 1146   APPEARANCEUR Cloudy (A) 07/18/2020 1257   LABSPEC 1.021  07/01/2020 1146   PHURINE 5.0 07/01/2020 1146   GLUCOSEU Negative 07/18/2020 1257   HGBUR SMALL (A) 07/01/2020 1146   BILIRUBINUR Negative 07/18/2020 1257   KETONESUR NEGATIVE 07/01/2020 1146   PROTEINUR Negative 07/18/2020 1257   PROTEINUR 30 (A) 07/01/2020 1146   NITRITE Positive (A) 07/18/2020 1257   NITRITE NEGATIVE 07/01/2020 1146   LEUKOCYTESUR 1+ (A) 07/18/2020 1257   LEUKOCYTESUR NEGATIVE 07/01/2020 1146    Radiological Exams on Admission: CT Head Wo Contrast  Result Date: 07/21/2020 CLINICAL DATA:  Altered mental status EXAM: CT HEAD WITHOUT CONTRAST TECHNIQUE: Contiguous axial images were obtained from the base of the skull through the vertex without intravenous contrast. COMPARISON:  02/15/2020 FINDINGS: Brain: No evidence of acute infarction, hemorrhage, hydrocephalus, or extra-axial hemorrhage. Stable CSF density structure within the left middle cranial fossa likely reflecting an arachnoid cyst. Mild scattered low-density changes within the periventricular and subcortical white matter compatible with chronic microvascular ischemic change. Mild diffuse cerebral volume loss. Vascular: Atherosclerotic calcifications involving the large vessels of the skull base. No unexpected hyperdense vessel. Skull: Normal. Negative for fracture or focal lesion. Sinuses/Orbits: No acute finding. Other: None. IMPRESSION: 1. No acute intracranial findings. 2. Chronic microvascular ischemic change and cerebral volume loss. 3. Stable left middle cranial fossa cyst, likely arachnoid cyst. Electronically Signed   By: Davina Poke D.O.   On: 07/21/2020 11:25   DG Chest Portable 1 View  Result Date: 07/21/2020 CLINICAL DATA:  Syncope EXAM: PORTABLE CHEST 1 VIEW COMPARISON:  July 01, 2020 FINDINGS: There is stable scarring in the right base. Lungs elsewhere are clear. Heart is upper normal in size with pulmonary vascularity normal. Pacemaker leads attached to right atrium and right ventricle. No evident  adenopathy. No bone lesions evident. IMPRESSION: No edema or consolidation. Scarring right base. Stable cardiac silhouette. Pacemaker leads attached to right atrium and right ventricle. Electronically Signed   By: Lowella Grip III M.D.   On: 07/21/2020 12:16    EKG: Independently reviewed.  Sinus rhythm PACs  Assessment/Plan Principal Problem:   Diarrhea Active Problems:   Depression   Syncopal episodes     Diarrhea Rule  out recurrent C. difficile infection Patient recently completed a course of oral vancomycin We will place patient on Dificid 200 mg twice daily to complete a 10-day course of therapy   Syncope Secondary to volume depletion from diarrhea Patient was hypotensive in the ER and responded to IV fluid resuscitation Continue midodrine and IV fluid to maintain systolic blood pressure over 11mmHg   Depression Continue venlafaxine and risperidone    Dementia Continue Namenda   DVT prophylaxis: Lovenox Code Status: DO NOT RESUSCITATE Family Communication: Greater than 50% of time was spent discussing patient's condition and plan of care with her daughter at the bedside.  All questions and concerns have been addressed.  They verbalized understanding and agree with the plan. Disposition Plan: Back to previous home environment Consults called: None    Jaquae Rieves MD [facility]     07/21/2020, 1:46 PM

## 2020-07-21 NOTE — ED Triage Notes (Signed)
Patient from home with daughter. Per daughter, patient has had increased weakness since this morning. Also reports episodes of confusion and staring into space. Patient was recently treated for cdiff and has seemed to have improved, until she started having diarrhea again two days ago. Patient hypotensive in triage.   Upon brought back to room in rolling walker. Patient requesting to go use restroom but encouraged by staff to get in bed first. Patient had syncopal episode in chair prior to moving to stretcher. Episode lasted approx 15 seconds. Was lifted into stretcher by staff. Patient slowly becoming responsive to voice at this time. EDP at bedside.

## 2020-07-21 NOTE — ED Provider Notes (Signed)
[facility] Emergency Department Provider Note   ____________________________________________   First MD Initiated Contact with Patient 07/21/20 1055     (approximate)  I have reviewed the triage vital signs and the nursing notes.   HISTORY  Chief Complaint Altered Mental Status    HPI Regina Hernandez is a 86 y.o. female with past medical history of dementia, sick sinus syndrome status post pacemaker, anemia, and orthostatic hypotension on midodrine presents to the ED for generalized weakness.  History is limited as patient had a witnessed syncopal event upon entering the room.  Her daughter states that she has been feeling weak for the past 2 days, has also started having diarrhea again.  She has not had any fevers, vomiting, abdominal pain, cough, chest pain, or shortness of breath.  Patient has had issues with low blood pressure recently and was started on Midrin due to orthostatic hypotension.  She had already taken her morning dose, but started feeling very lightheaded when brought back to the room in her wheelchair, subsequently passed out.  She is now gradually waking up and denies any current chest pain or shortness of breath.  She has been eating and drinking less than usual with the onset of diarrhea, which patient describes as similar to when she recently dealt with C. difficile.  Daughter also states there have been times over the past couple of days where patient has seemed confused, although upon waking from syncopal episode, she feels that patient is back to her baseline.        Past Medical History:  Diagnosis Date  . Anxiety   . Cervical dystonia   . Essential tremor   . GI bleed   . Memory loss   . Migraine   . Occipital neuralgia     Patient Active Problem List   Diagnosis Date Noted  . Diarrhea 07/21/2020  . Acute colitis 07/01/2020  . AKI (acute kidney injury) ([place]) 07/01/2020  . Sepsis ([place]) 07/01/2020  . Ventricular tachycardia  ([place]) 06/22/2020  . Syncopal episodes 06/21/2020  . Orthostatic hypotension 06/21/2020  . Acute GI bleeding 06/18/2020  . Melena 06/17/2020  . Essential tremor   . Arachnoid cyst 02/11/2020  . Chronic headaches 02/11/2020  . Coccydynia 02/11/2020  . H/O staphylococcal infection 02/11/2020  . Migraine 02/11/2020  . Vertigo 02/11/2020  . Memory loss 02/04/2020  . Gait abnormality 02/04/2020  . Depression 02/04/2020  . Confusion 02/04/2020  . Major depression with psychotic features ([place]) 10/31/2019  . History of suicide attempt 10/30/2019  . Iron deficiency anemia 05/22/2019  . Status post left knee replacement 11/20/2018  . Pain of cervical facet joint 07/13/2018  . Arthritis 06/01/2018  . Tremor 06/01/2018  . Spondylosis without myelopathy or radiculopathy, cervical region 01/19/2017  . Diverticulosis of large intestine without hemorrhage 11/30/2016  . Stage 3 chronic kidney disease 11/30/2016  . Plantar fasciitis of left foot 09/29/2016  . Left knee pain 11/26/2014  . Polyp of colon 11/01/2013  . Hypertension 03/01/2012  . Pacemaker 01/26/2012  . History of endocarditis 09/15/2011  . Coronary atherosclerosis 08/17/2011  . Sick sinus syndrome ([place]) 08/17/2011  . Barrett's esophagus without dysplasia 07/20/2010  . Microscopic hematuria 07/20/2010  . Recurrent UTI 07/20/2010  . Vestibulitis, vulvar 07/20/2010    Past Surgical History:  Procedure Laterality Date  . BOWEL RESECTION    . CARDIAC CATHETERIZATION  2016   no stents/[facility]  . ESOPHAGOGASTRODUODENOSCOPY N/A 06/18/2020   Procedure: ESOPHAGOGASTRODUODENOSCOPY (EGD);  Surgeon: Ridgetop,  Benay Pike, MD;  Location: [facility] ENDOSCOPY;  Service: Gastroenterology;  Laterality: N/A;  . PACEMAKER IMPLANT    . REPLACEMENT TOTAL KNEE Left     Prior to Admission medications   Medication Sig Start Date End Date Taking? Authorizing Provider  Cholecalciferol (VITAMIN D3 PO) Take 25 mcg by mouth daily.   Yes  [provider]  Docusate Calcium (STOOL SOFTENER PO) Take 1 capsule by mouth in the morning and at bedtime.    Yes [provider]  estradiol (ESTRACE) 0.1 MG/GM vaginal cream Place 1 Applicatorful vaginally as needed.   Yes [provider]  fluvoxaMINE (LUVOX) 50 MG tablet Take 50 mg by mouth 2 (two) times daily.   Yes [provider]  gabapentin (NEURONTIN) 400 MG capsule Take 1 capsule (400 mg total) by mouth 3 (three) times daily. 04/22/20  Yes Suzzanne Cloud, NP  memantine (NAMENDA) 10 MG tablet TAKE 1 TABLET BY MOUTH TWICE A DAY Patient taking differently: Take 10 mg by mouth 2 (two) times daily.  02/26/20  Yes Marcial Pacas, MD  midodrine (PROAMATINE) 2.5 MG tablet Take 1 tablet (2.5 mg total) by mouth 3 (three) times daily with meals. 07/14/20  Yes Agbor-Etang, Aaron Edelman, MD  pantoprazole (PROTONIX) 40 MG tablet Take 1 tablet (40 mg total) by mouth 2 (two) times daily. 06/19/20 07/21/20 Yes Barb Merino, MD  primidone (MYSOLINE) 50 MG tablet Take 2 tablets (100 mg total) by mouth at bedtime. 04/22/20  Yes Suzzanne Cloud, NP  risperiDONE (RISPERDAL) 1 MG tablet Take 1 mg by mouth at bedtime. 07/17/20  Yes [provider]  sucralfate (CARAFATE) 1 g tablet Take 1 g by mouth 4 (four) times daily as needed. 07/01/20  Yes [provider]  topiramate (TOPAMAX) 50 MG tablet Take 50 mg by mouth 3 (three) times daily. 05/12/20  Yes [provider]  venlafaxine XR (EFFEXOR-XR) 75 MG 24 hr capsule Take 75 mg by mouth daily. 06/28/20  Yes [provider]  vitamin B-12 (CYANOCOBALAMIN) 1000 MCG tablet Take 1,000 mcg by mouth daily.   Yes [provider]  acetaminophen (TYLENOL) 500 MG tablet Take 2 tablets (1,000 mg total) by mouth every 8 (eight) hours as needed for mild pain or moderate pain. 06/19/20   Barb Merino, MD    Allergies Adhesive [tape], Baclofen, Lyrica [pregabalin], and Sulfa antibiotics  Family History  Problem Relation  Age of Onset  . Other Mother        "old age"  . Ulcers Father   . Stomach cancer Brother     Social History Social History   Tobacco Use  . Smoking status: Never Smoker  . Smokeless tobacco: Never Used  Vaping Use  . Vaping Use: Never used  Substance Use Topics  . Alcohol use: Not Currently  . Drug use: Never    Review of Systems  Constitutional: No fever/chills.  Positive for lightheadedness and generalized weakness. Eyes: No visual changes. ENT: No sore throat. Cardiovascular: Denies chest pain.  Positive for syncope. Respiratory: Denies shortness of breath. Gastrointestinal: No abdominal pain.  No nausea, no vomiting.  Positive for diarrhea.  No constipation. Genitourinary: Negative for dysuria. Musculoskeletal: Negative for back pain. Skin: Negative for rash. Neurological: Negative for headaches, focal weakness or numbness.  ____________________________________________   PHYSICAL EXAM:  VITAL SIGNS: ED Triage Vitals [07/21/20 1047]  Enc Vitals Group     BP (!) 68/50     Pulse Rate 93     Resp 16  Temp 98.4 F (36.9 C)     Temp Source Oral     SpO2 96 %     Weight      Height      Head Circumference      Peak Flow      Pain Score      Pain Loc      Pain Edu?      Excl. in [place]?     Constitutional: Alert and oriented. Eyes: Conjunctivae are normal. Head: Atraumatic. Nose: No congestion/rhinnorhea. Mouth/Throat: Mucous membranes are dry. Neck: Normal ROM Cardiovascular: Normal rate, regular rhythm. Grossly normal heart sounds. Respiratory: Normal respiratory effort.  No retractions. Lungs CTAB. Gastrointestinal: Soft and nontender. No distention. Genitourinary: deferred Musculoskeletal: No lower extremity tenderness nor edema. Neurologic:  Normal speech and language. No gross focal neurologic deficits are appreciated. Skin:  Skin is warm, dry and intact. No rash noted. Psychiatric: Mood and affect are normal. Speech and behavior are  normal.  ____________________________________________   LABS (all labs ordered are listed, but only abnormal results are displayed)  Labs Reviewed  CBC WITH DIFFERENTIAL/PLATELET - Abnormal; Notable for the following components:      Result Value   WBC 12.5 (*)    Neutro Abs 9.9 (*)    All other components within normal limits  COMPREHENSIVE METABOLIC PANEL - Abnormal; Notable for the following components:   Sodium 134 (*)    Glucose, Bld 105 (*)    GFR calc non Af Amer 52 (*)    All other components within normal limits  URINALYSIS, COMPLETE (UACMP) WITH MICROSCOPIC - Abnormal; Notable for the following components:   Color, Urine YELLOW (*)    APPearance HAZY (*)    Hgb urine dipstick MODERATE (*)    Nitrite POSITIVE (*)    Leukocytes,Ua SMALL (*)    Bacteria, UA MANY (*)    All other components within normal limits  GASTROINTESTINAL PANEL BY PCR, STOOL (REPLACES STOOL CULTURE)  SARS CORONAVIRUS 2 BY RT PCR (HOSPITAL ORDER, [place] [facility])  TROPONIN I (HIGH SENSITIVITY)  TROPONIN I (HIGH SENSITIVITY)   ____________________________________________  EKG  ED ECG REPORT I, Blake Divine, the attending physician, personally viewed and interpreted this ECG.   Date: 07/21/2020  EKG Time: 11:01  Rate: 70  Rhythm: Atrially paced  Axis: Normal  Intervals:none  ST&T Change: None   PROCEDURES  Procedure(s) performed (including Critical Care):  Procedures   ____________________________________________   INITIAL IMPRESSION / ASSESSMENT AND PLAN / ED COURSE       86 year old female with past medical history of dementia, sick sinus syndrome, anemia, and orthostatic hypotension on midodrine presents to the ED complaining of 2 days of generalized weakness in the setting of recurrent diarrhea.  Patient noted to be hypotensive in triage, subsequently syncopized while sitting in the wheelchair after being brought back to her room.  She gradually  regained consciousness, was started on IV fluid bolus and blood pressure improved.  She is now awake and alert, at her baseline mental status per daughter, and does not appear to have any focal neurologic deficits.  CT head was performed and negative for acute process, EKG is unremarkable and we will attempt to interrogate her pacemaker.  I am concerned for recurrent C. difficile given onset of watery diarrhea and treatment for C. difficile earlier this month.  Lab work is unremarkable, but given her episode of hypotension, case discussed with hospitalist for admission for empiric treatment of C. difficile.  Repeat testing is apparently not indicated within 30 days of last positive.      ____________________________________________   FINAL CLINICAL IMPRESSION(S) / ED DIAGNOSES  Final diagnoses:  Generalized weakness  Diarrhea of presumed infectious origin     ED Discharge Orders    None       Note:  This document was prepared using Dragon voice recognition software and may include unintentional dictation errors.   Blake Divine, MD 07/21/20 [phone]

## 2020-07-21 NOTE — ED Notes (Signed)
Pt's family wanted pt to get checked out for report of runny stools since Saturday. Family was also concerned that pt was not acting herself today.

## 2020-07-21 NOTE — Telephone Encounter (Signed)
Noted. Agree with ER evaluation given patient's recent multiple, severe illnesses.

## 2020-07-21 NOTE — Telephone Encounter (Signed)
Regina Hernandez pts daughter (DPR not signed for [name]) walked in with note that pt is having multiple loose brown stools that started on 07/20/20. Pt is not acting right and staring into space. Pt has finished vancomycin and thinks may have something in urine culture; pt saw urologist on 07/18/20. pts TPR 99.1 - 76- 20 BP 100/54 and O2 sat is 94%. Wants pt seen. Pt is not with [name]. I called [name] who is on DPR and she said in last 2 1/2 wks pt has been in hospital 3 times. Advised if pt is running fever, loose stools,? Diarrhea and pt is staring into space then pt needs in person eval at ED. Kim voiced understanding and she said for [name] to go home and call [name] and pt will go to [facility] ED. [name] notified and voiced understanding.Glenda Chroman FNP is out of office and will send note as FYI to Glenda Chroman FNP as PCP and Dr Einar Pheasant as provider in office. Sending the note [name] brought in for scanning and a copy iin Dr Verda Cumins in basket on desk.

## 2020-07-22 ENCOUNTER — Telehealth: Payer: Self-pay

## 2020-07-22 ENCOUNTER — Other Ambulatory Visit: Payer: Self-pay

## 2020-07-22 DIAGNOSIS — F329 Major depressive disorder, single episode, unspecified: Secondary | ICD-10-CM | POA: Diagnosis not present

## 2020-07-22 DIAGNOSIS — R197 Diarrhea, unspecified: Secondary | ICD-10-CM

## 2020-07-22 DIAGNOSIS — E876 Hypokalemia: Secondary | ICD-10-CM | POA: Diagnosis not present

## 2020-07-22 DIAGNOSIS — A0472 Enterocolitis due to Clostridium difficile, not specified as recurrent: Secondary | ICD-10-CM

## 2020-07-22 DIAGNOSIS — I951 Orthostatic hypotension: Secondary | ICD-10-CM

## 2020-07-22 DIAGNOSIS — R55 Syncope and collapse: Secondary | ICD-10-CM

## 2020-07-22 LAB — BASIC METABOLIC PANEL
Anion gap: 7 (ref 5–15)
BUN: 9 mg/dL (ref 8–23)
CO2: 23 mmol/L (ref 22–32)
Calcium: 8.7 mg/dL — ABNORMAL LOW (ref 8.9–10.3)
Chloride: 107 mmol/L (ref 98–111)
Creatinine, Ser: 0.72 mg/dL (ref 0.44–1.00)
GFR calc Af Amer: >60 mL/min (ref >60)
GFR calc non Af Amer: >60 mL/min (ref >60)
Glucose, Bld: 95 mg/dL (ref 70–99)
Potassium: 2.8 mmol/L — ABNORMAL LOW (ref 3.5–5.1)
Sodium: 137 mmol/L (ref 135–145)

## 2020-07-22 LAB — CBC
HCT: 31.2 % — ABNORMAL LOW (ref 36.0–46.0)
Hemoglobin: 10.5 g/dL — ABNORMAL LOW (ref 12.0–15.0)
MCH: 31.6 pg (ref 26.0–34.0)
MCHC: 33.7 g/dL (ref 30.0–36.0)
MCV: 94 fL (ref 80.0–100.0)
Platelets: 137 10*3/uL — ABNORMAL LOW (ref 150–400)
RBC: 3.32 MIL/uL — ABNORMAL LOW (ref 3.87–5.11)
RDW: 15.1 % (ref 11.5–15.5)
WBC: 9.2 10*3/uL (ref 4.0–10.5)
nRBC: 0 % (ref 0.0–0.2)

## 2020-07-22 LAB — MAGNESIUM: Magnesium: 1.7 mg/dL (ref 1.7–2.4)

## 2020-07-22 MED ORDER — MAGNESIUM SULFATE 2 GM/50ML IV SOLN
2.0000 g | Freq: Once | INTRAVENOUS | Status: AC
  Administered 2020-07-22: 2 g via INTRAVENOUS
  Filled 2020-07-22: qty 50

## 2020-07-22 MED ORDER — POTASSIUM CHLORIDE CRYS ER 20 MEQ PO TBCR
40.0000 meq | EXTENDED_RELEASE_TABLET | Freq: Once | ORAL | Status: AC
  Administered 2020-07-22: 40 meq via ORAL
  Filled 2020-07-22: qty 2

## 2020-07-22 NOTE — Telephone Encounter (Signed)
Incoming call from pt's daughter who wanted to make Dr. Junious Silk aware that pt is currently admitted in the hospital at [facility] with a UTI. Message sent to provider.

## 2020-07-22 NOTE — Plan of Care (Signed)
Patient oriented to room and call bell and enteric precautions. Bed alarm placed on and bed in lowest position.  IVF infusing.  Patient verbalized understanding, no needs or concerns voiced.

## 2020-07-22 NOTE — Progress Notes (Signed)
1        [place] [facility] [place] NAME: Regina Hernandez    MR#:  [id number]  DATE OF BIRTH:  1934-08-19  SUBJECTIVE:  CHIEF COMPLAINT:   Chief Complaint  Patient presents with  . Altered Mental Status  Very weak, still having frequent loose watery diarrhea, daughter at bedside REVIEW OF SYSTEMS:  Review of Systems  Constitutional: Positive for malaise/fatigue. Negative for diaphoresis, fever and weight loss.  HENT: Negative for ear discharge, ear pain, hearing loss, nosebleeds, sore throat and tinnitus.   Eyes: Negative for blurred vision and pain.  Respiratory: Negative for cough, hemoptysis, shortness of breath and wheezing.   Cardiovascular: Negative for chest pain, palpitations, orthopnea and leg swelling.  Gastrointestinal: Positive for diarrhea and nausea. Negative for abdominal pain, blood in stool, constipation, heartburn and vomiting.  Genitourinary: Negative for dysuria, frequency and urgency.  Musculoskeletal: Negative for back pain and myalgias.  Skin: Negative for itching and rash.  Neurological: Negative for dizziness, tingling, tremors, focal weakness, seizures, weakness and headaches.  Psychiatric/Behavioral: Negative for depression. The patient is not nervous/anxious.    DRUG ALLERGIES:   Allergies  Allergen Reactions  . Adhesive [Tape] Other (See Comments)    Contact dermatitis  . Baclofen Rash  . Lyrica [Pregabalin] Other (See Comments)    Unable to remember reaction.  . Sulfa Antibiotics Rash   VITALS:  Blood pressure (!) 151/93, pulse 74, temperature 98.4 F (36.9 C), temperature source Oral, resp. rate 21, height 5\' 4"  (1.626 m), weight 58.1 kg, SpO2 95 %. PHYSICAL EXAMINATION:  Physical Exam Constitutional:      Appearance: She is underweight.  HENT:     Head: Normocephalic and atraumatic.  Eyes:     Conjunctiva/sclera: Conjunctivae normal.     Pupils: Pupils are equal, round, and reactive to light.  Neck:     Thyroid: No  thyromegaly.     Trachea: No tracheal deviation.  Cardiovascular:     Rate and Rhythm: Normal rate and regular rhythm.     Heart sounds: Normal heart sounds.  Pulmonary:     Effort: Pulmonary effort is normal. No respiratory distress.     Breath sounds: Normal breath sounds. No wheezing.  Chest:     Chest wall: No tenderness.  Abdominal:     General: Bowel sounds are normal. There is no distension.     Palpations: Abdomen is soft.     Tenderness: There is no abdominal tenderness.  Musculoskeletal:        General: Normal range of motion.     Cervical back: Normal range of motion and neck supple.  Skin:    General: Skin is warm and dry.     Findings: No rash.  Neurological:     Mental Status: She is alert and oriented to person, place, and time.     Cranial Nerves: No cranial nerve deficit.    LABORATORY PANEL:  Female CBC Recent Labs  Lab 07/22/20 0622  WBC 9.2  HGB 10.5*  HCT 31.2*  PLT 137*   ------------------------------------------------------------------------------------------------------------------ Chemistries  Recent Labs  Lab 07/21/20 1056 07/21/20 1056 07/22/20 0622  NA 134*   < > 137  K 3.5   < > 2.8*  CL 101   < > 107  CO2 25   < > 23  GLUCOSE 105*   < > 95  BUN 18   < > 9  CREATININE 0.99   < > 0.72  CALCIUM 9.2   < >  8.7*  MG  --   --  1.7  AST 19  --   --   ALT 13  --   --   ALKPHOS 55  --   --   BILITOT 1.0  --   --    < > = values in this interval not displayed.   RADIOLOGY:  No results found. ASSESSMENT AND PLAN:  86 year old female with a known history of essential tremor, gastric ulcer, anxiety, orthostatic hypotension leading to syncope is admitted for loose watery diarrhea for last 3 to 4 days.  Diarrhea Unsure if this is recurrence of C. difficile infection or another viral infection causing diarrhea Patient was recently admitted to the hospital from 7/6 till 7/9 and was discharged on oral vancomycin for total 10 days.  As per  patient's daughter patient just completed treatment of oral vancomycin last week and started having diarrhea again. -Stool for GI panel is negative.  I do not see C. difficile test checked, will let ID decide need for testing -Patient has been started on Dificid by admitting physician, I will consult ID for input.  Hypokalemia -potassium of 2.8 Likely from ongoing diarrhea and poor p.o. intake Replete and recheck, check magnesium  Abnormal UA Patient does not have any urinary symptoms.  I will order urine culture sensitivity hold off antibiotics for now considering her risk for C. difficile.  Patient is afebrile and does not have any leukocytosis  Depression Continue venlafaxine and risperidone at home dosage  Moderate protein calorie malnutrition Likely due to poor p.o. intake Body mass index is 21.97 kg/m.   Weakness Likely multifactorial, will consult PT for further evaluation      Status is: Inpatient  Remains inpatient appropriate because:Inpatient level of care appropriate due to severity of illness   Dispo: The patient is from: Home              Anticipated d/c is to: Home              Anticipated d/c date is: 2 days              Patient currently is not medically stable to d/c.  Will need ID evaluation, potassium replacement and diarrhea to improve.    DVT prophylaxis:            enoxaparin (LOVENOX) injection 40 mg Start: 07/21/20 1345     Family Communication: Daughter at bedside   All the records are reviewed and case discussed with Care Management/Social Worker. Management plans discussed with the patient, family (daughter at bedside) and they are in agreement.  CODE STATUS: DNR  TOTAL TIME TAKING CARE OF THIS PATIENT: 35 minutes.   More than 50% of the time was spent in counseling/coordination of care: YES  POSSIBLE D/C IN 1-2 DAYS, DEPENDING ON CLINICAL CONDITION.   Max Sane M.D on 07/22/2020 at 2:26 PM  [facility]   CC: Primary care  physician; Elby Beck, FNP  Note: This dictation was prepared with Dragon dictation along with smaller phrase technology. Any transcriptional errors that result from this process are unintentional.

## 2020-07-22 NOTE — Consult Note (Signed)
NAME: Regina Hernandez  DOB: 12/20/1934  MRN: [id number]  Date/Time: 07/22/2020 5:07 PM  REQUESTING PROVIDER Subjective:  REASON FOR CONSULT:  ? Regina Hernandez is a 86 y.o. with a history of recent C. difficile infection, migraine, occipital neuralgia, memory loss, history of left TKA, pacemaker recent hospitalization for the C. difficile between 07/01/2020 until 07/04/2020 is admitted again with diarrhea.  Patient was sent on oral vancomycin on 07/04/2020.  Patient has had multiple hospitalizations recently She was in the hospital in June for symptomatic anemia with syncope, anemia of acute blood loss and possibly due to the bleeding gastric ulcer.  She was asked to stop NSAID. She was readmitted between 626 until 06/23/2020 for syncope again The pacemaker was interrogated and was normal.  During her previous admission she had received blood transfusion as well as iron infusion.  Her routine UA had WBC and urine culture 100,000 colonies of E. coli.  Even though she did not have any specific urinary symptoms she was treated with Keflex during that hospitalization and discharged home on Keflex.  This led to her 3rd hospitalization between 07/01/2020 until 07/04/2020 for the diarrhea and C. difficile.  She had positive antigen but negative toxin and a positive PCR.  She was t sent home on p.o. vancomycin which she completed and the diarrhea restarted after that.  In the ED her BP was 68/50, temperature 98.4, pulse ox of 96% and heart rate of 93. Labs revealed hemoglobin of 12.3, WBC of 12.5 and platelet of 187.  Sodium was 134, potassium 3.5 and creatinine of 0.99. I am seeing the patient for diarrhea and C. difficile.    Past Medical History:  Diagnosis Date   Anxiety    Cervical dystonia    Essential tremor    GI bleed    Memory loss    Migraine    Occipital neuralgia     Past Surgical History:  Procedure Laterality Date   BOWEL RESECTION     CARDIAC CATHETERIZATION  2016   no  stents/[facility]   ESOPHAGOGASTRODUODENOSCOPY N/A 06/18/2020   Procedure: ESOPHAGOGASTRODUODENOSCOPY (EGD);  Surgeon: Toledo, Benay Pike, MD;  Location: [facility] ENDOSCOPY;  Service: Gastroenterology;  Laterality: N/A;   PACEMAKER IMPLANT     REPLACEMENT TOTAL KNEE Left     Social History   Socioeconomic History   Marital status: Widowed    Spouse name: Not on file   Number of children: 5   Years of education: 12th   Highest education level: High school graduate  Occupational History   Occupation: Retired  Tobacco Use   Smoking status: Never Smoker   Smokeless tobacco: Never Used  Scientific laboratory technician Use: Never used  Substance and Sexual Activity   Alcohol use: Not Currently   Drug use: Never   Sexual activity: Not on file  Other Topics Concern   Not on file  Social History Narrative   Lives at home with her daughters.   Right-handed.   Two cups caffeine per day.   Social Determinants of Health   Financial Resource Strain:    Difficulty of Paying Living Expenses:   Food Insecurity:    Worried About Charity fundraiser in the Last Year:    Arboriculturist in the Last Year:   Transportation Needs:    Film/video editor (Medical):    Lack of Transportation (Non-Medical):   Physical Activity:    Days of Exercise per Week:    Minutes of Exercise per  Session:   Stress:    Feeling of Stress :   Social Connections:    Frequency of Communication with Friends and Family:    Frequency of Social Gatherings with Friends and Family:    Attends Religious Services:    Active Member of Clubs or Organizations:    Attends Music therapist:    Marital Status:   Intimate Partner Violence:    Fear of Current or Ex-Partner:    Emotionally Abused:    Physically Abused:    Sexually Abused:     Family History  Problem Relation Age of Onset   Other Mother        "old age"   Ulcers Father    Stomach cancer Brother      Allergies  Allergen Reactions   Adhesive [Tape] Other (See Comments)    Contact dermatitis   Baclofen Rash   Lyrica [Pregabalin] Other (See Comments)    Unable to remember reaction.   Sulfa Antibiotics Rash   ? Current Facility-Administered Medications  Medication Dose Route Frequency Provider Last Rate Last Admin   0.9 %  sodium chloride infusion   Intravenous Continuous Agbata, Tochukwu, MD 100 mL/hr at 07/22/20 0940 New Bag at 07/22/20 0940   acetaminophen (TYLENOL) tablet 1,000 mg  1,000 mg Oral Q8H PRN Agbata, Tochukwu, MD       cholecalciferol (VITAMIN D3) tablet 25 mcg  25 mcg Oral Daily Agbata, Tochukwu, MD   25 mcg at 07/22/20 1048   enoxaparin (LOVENOX) injection 40 mg  40 mg Subcutaneous Q24H Agbata, Tochukwu, MD   40 mg at 07/22/20 1340   [START ON 07/23/2020] estradiol (ESTRACE) vaginal cream 1 Applicatorful  1 Applicatorful Vaginal Once per day on Mon Wed Fri Agbata, Tochukwu, MD       fidaxomicin (DIFICID) tablet 200 mg  200 mg Oral BID Agbata, Tochukwu, MD   200 mg at 07/22/20 1049   fluvoxaMINE (LUVOX) tablet 50 mg  50 mg Oral BID Agbata, Tochukwu, MD   50 mg at 07/22/20 1050   gabapentin (NEURONTIN) capsule 400 mg  400 mg Oral TID Agbata, Tochukwu, MD   400 mg at 07/22/20 1050   memantine (NAMENDA) tablet 10 mg  10 mg Oral BID Agbata, Tochukwu, MD   10 mg at 07/22/20 1049   midodrine (PROAMATINE) tablet 2.5 mg  2.5 mg Oral TID WC Agbata, Tochukwu, MD   2.5 mg at 07/22/20 1341   ondansetron (ZOFRAN) tablet 4 mg  4 mg Oral Q6H PRN Agbata, Tochukwu, MD       Or   ondansetron (ZOFRAN) injection 4 mg  4 mg Intravenous Q6H PRN Agbata, Tochukwu, MD       primidone (MYSOLINE) tablet 100 mg  100 mg Oral QHS Agbata, Tochukwu, MD   100 mg at 07/21/20 2107   risperiDONE (RISPERDAL) tablet 1 mg  1 mg Oral QHS Agbata, Tochukwu, MD   1 mg at 07/21/20 2106   sodium chloride flush (NS) 0.9 % injection 3 mL  3 mL Intravenous Q12H Agbata, Tochukwu, MD        topiramate (TOPAMAX) tablet 50 mg  50 mg Oral TID Agbata, Tochukwu, MD   50 mg at 07/22/20 1048   venlafaxine XR (EFFEXOR-XR) 24 hr capsule 75 mg  75 mg Oral Daily Agbata, Tochukwu, MD   75 mg at 07/22/20 1049   vitamin B-12 (CYANOCOBALAMIN) tablet 1,000 mcg  1,000 mcg Oral Daily Agbata, Tochukwu, MD   1,000 mcg at 07/22/20 1049  Abtx:  Anti-infectives (From admission, onward)   Start     Dose/Rate Route Frequency Ordered Stop   07/21/20 1415  fidaxomicin (DIFICID) tablet 200 mg     Discontinue     200 mg Oral 2 times daily 07/21/20 1402 07/31/20 0959      REVIEW OF SYSTEMS:  Const: negative fever, negative chills, negative weight loss Eyes: negative diplopia or visual changes, negative eye pain ENT: negative coryza, negative sore throat Resp: negative cough, hemoptysis, dyspnea Cards: negative for chest pain, palpitations, lower extremity edema GU: negative for frequency, dysuria and hematuria GI: Negative for abdominal pain, has multiple episodes of diarrhea especially after eating no bleeding, constipation Skin: negative for rash and pruritus Heme: negative for easy bruising and gum/nose bleeding MS: Generalized weakness Neurolo: Has syncopal episodes and dizziness. Psych: negative for feelings of anxiety, depression  Endocrine: negative for thyroid, diabetes Allergy/Immunology-as above?  Objective:  VITALS:  BP 124/65 (BP Location: Right Arm)    Pulse 63    Temp 98.6 F (37 C) (Oral)    Resp 20    Ht 5\' 4"  (1.626 m)    Wt 58.1 kg    SpO2 96%    BMI 21.97 kg/m  PHYSICAL EXAM:  General: Alert, cooperative, no distress, appears stated age. Oriented in place, person, time, date Head: Normocephalic, without obvious abnormality, atraumatic. Eyes: Conjunctivae clear, anicteric sclerae. Pupils are equal ENT did not examine as she had a mask Neck: Supple, symmetrical, no adenopathy, thyroid: non tender no carotid bruit and no JVD. Back: No CVA tenderness. Lungsb/l air  entry Heart: s1s2 Pacemaker  Abdomen: Soft, non-tender,not distended. Bowel sounds normal. No masses Extremities: Left knee scar , no cyanosis. No edema. No clubbing Skin: No rashes or lesions. Or bruising Lymph: Cervical, supraclavicular normal. Neurologic: Grossly non-focal Pertinent Labs Lab Results CBC    Component Value Date/Time   WBC 9.2 07/22/2020 0622   RBC 3.32 (L) 07/22/2020 0622   HGB 10.5 (L) 07/22/2020 0622   HGB 12.8 02/04/2020 1439   HCT 31.2 (L) 07/22/2020 0622   HCT 39.4 02/04/2020 1439   PLT 137 (L) 07/22/2020 0622   MCV 94.0 07/22/2020 0622   MCV 93 02/04/2020 1439   MCH 31.6 07/22/2020 0622   MCHC 33.7 07/22/2020 0622   RDW 15.1 07/22/2020 0622   RDW 11.7 02/04/2020 1439   LYMPHSABS 1.6 07/21/2020 1056   LYMPHSABS 2.8 02/04/2020 1439   MONOABS 0.8 07/21/2020 1056   EOSABS 0.0 07/21/2020 1056   EOSABS 0.3 02/04/2020 1439   BASOSABS 0.1 07/21/2020 1056   BASOSABS 0.1 02/04/2020 1439    CMP Latest Ref Rng & Units 07/22/2020 07/21/2020 07/04/2020  Glucose 70 - 99 mg/dL 95 105(H) 92  BUN 8 - 23 mg/dL 9 18 15   Creatinine 0.44 - 1.00 mg/dL 0.72 0.99 0.79  Sodium 135 - 145 mmol/L 137 134(L) 138  Potassium 3.5 - 5.1 mmol/L 2.8(L) 3.5 3.6  Chloride 98 - 111 mmol/L 107 101 109  CO2 22 - 32 mmol/L 23 25 24   Calcium 8.9 - 10.3 mg/dL 8.7(L) 9.2 8.9  Total Protein 6.5 - 8.1 g/dL - 6.8 -  Total Bilirubin 0.3 - 1.2 mg/dL - 1.0 -  Alkaline Phos 38 - 126 U/L - 55 -  AST 15 - 41 U/L - 19 -  ALT 0 - 44 U/L - 13 -      Microbiology: Recent Results (from the past 240 hour(s))  Microscopic Examination     Status: Abnormal  Collection Time: 07/18/20 12:57 PM   Urine  Result Value Ref Range Status   WBC, UA >30 (A) 0 - 5 /hpf Final   RBC 3-10 (A) 0 - 2 /hpf Final   Epithelial Cells (non renal) 0-10 0 - 10 /hpf Final   Renal Epithel, UA 0-10 (A) None seen /hpf Final   Casts Present (A) None seen /lpf Final   Cast Type Granular casts (A) N/A Final   Crystals  Present (A) N/A Final   Crystal Type Calcium Oxalate N/A Final   Bacteria, UA Many (A) None seen/Few Final  CULTURE, URINE COMPREHENSIVE     Status: Abnormal (Preliminary result)   Collection Time: 07/18/20  2:20 PM   Specimen: Urine   UR  Result Value Ref Range Status   Urine Culture, Comprehensive Preliminary report (A)  Preliminary   Organism ID, Bacteria Gram negative rods (A)  Preliminary    Comment: Greater than 100,000 colony forming units per mL   Organism ID, Bacteria Gram negative rods (A)  Preliminary    Comment: Greater than 100,000 colony forming units per mL  Gastrointestinal Panel by PCR , Stool     Status: None   Collection Time: 07/21/20 11:30 AM   Specimen: Stool  Result Value Ref Range Status   Campylobacter species NOT DETECTED NOT DETECTED Final   Plesimonas shigelloides NOT DETECTED NOT DETECTED Final   Salmonella species NOT DETECTED NOT DETECTED Final   Yersinia enterocolitica NOT DETECTED NOT DETECTED Final   Vibrio species NOT DETECTED NOT DETECTED Final   Vibrio cholerae NOT DETECTED NOT DETECTED Final   Enteroaggregative E coli (EAEC) NOT DETECTED NOT DETECTED Final   Enteropathogenic E coli (EPEC) NOT DETECTED NOT DETECTED Final   Enterotoxigenic E coli (ETEC) NOT DETECTED NOT DETECTED Final   Shiga like toxin producing E coli (STEC) NOT DETECTED NOT DETECTED Final   Shigella/Enteroinvasive E coli (EIEC) NOT DETECTED NOT DETECTED Final   Cryptosporidium NOT DETECTED NOT DETECTED Final   Cyclospora cayetanensis NOT DETECTED NOT DETECTED Final   Entamoeba histolytica NOT DETECTED NOT DETECTED Final   Giardia lamblia NOT DETECTED NOT DETECTED Final   Adenovirus F40/41 NOT DETECTED NOT DETECTED Final   Astrovirus NOT DETECTED NOT DETECTED Final   Norovirus GI/GII NOT DETECTED NOT DETECTED Final   Rotavirus A NOT DETECTED NOT DETECTED Final   Sapovirus (I, II, IV, and V) NOT DETECTED NOT DETECTED Final    Comment: Performed at [facility], [address] [place], [place] [address]  SARS Coronavirus 2 by RT PCR (hospital order, performed in [place] hospital lab) Nasopharyngeal Nasopharyngeal Swab     Status: None   Collection Time: 07/21/20 12:31 PM   Specimen: Nasopharyngeal Swab  Result Value Ref Range Status   SARS Coronavirus 2 NEGATIVE NEGATIVE Final    Comment: (NOTE) SARS-CoV-2 target nucleic acids are NOT DETECTED.  The SARS-CoV-2 RNA is generally detectable in upper and lower respiratory specimens during the acute phase of infection. The lowest concentration of SARS-CoV-2 viral copies this assay can detect is 250 copies / mL. A negative result does not preclude SARS-CoV-2 infection and should not be used as the sole basis for treatment or other patient management decisions.  A negative result may occur with improper specimen collection / handling, submission of specimen other than nasopharyngeal swab, presence of viral mutation(s) within the areas targeted by this assay, and inadequate number of viral copies (<250 copies / mL). A negative result must be combined with  clinical observations, patient history, and epidemiological information.  Fact Sheet for Patients:   StrictlyIdeas.no  Fact Sheet for Healthcare Providers: BankingDealers.co.za  This test is not yet approved or  cleared by the [place] FDA and has been authorized for detection and/or diagnosis of SARS-CoV-2 by FDA under an Emergency Use Authorization (EUA).  This EUA will remain in effect (meaning this test can be used) for the duration of the COVID-19 declaration under Section 564(b)(1) of the Act, 21 U.S.C. section 360bbb-3(b)(1), unless the authorization is terminated or revoked sooner.  Performed at [facility], [address] [place], [place] [address]     IMAGING RESULTS:  CT head no acute changes.  I have personally reviewed the  films ? Impression/Recommendation ? ?Diarrhea is due to C. difficile . Was initially treated with vancomycin during last hospitalization, so I  agree that she continue fidaxomicin for 10 days total. Normal WBC, normal creatinine, and albumin of 3.8   She is colonized with bacteria in the urine and should not be treated for asymptomatic bacteriuria.  As per patient she has never had urinary symptoms but was given antibiotics a month ago for presumed UTI.  Syncope: Orthostatic hypotension.  Patient is on polypharmacy.  Most of the medications that she is taking  Effexor, fluoxetine memantine, primidone Topamax, risperidone, gabapentin have potential to cause dizziness and orthostatic hypotension.  At her age she should not be on this many medications.  Would seriously look into tapering and stopping these medications. She has been started on midodrine for orthostasis but the most important thing is to get her off the above medications that is causing the orthostasis.   History of intentional overdose with Ambien secondary to depression following her husband passing away   Sick sinus syndrome with pacemaker  History of left TKA ___________________________________________________ Discussed with patient,.  Tried to call her daughter but could not leave a voicemail Note:  This document was prepared using Systems analyst and may include unintentional dictation errors.

## 2020-07-22 NOTE — ED Notes (Addendum)
Lunch tray given to pt. Pt assisted to bathroom.

## 2020-07-22 NOTE — ED Notes (Signed)
ED TO INPATIENT HANDOFF REPORT  ED Nurse Name and Phone #: [phone]  S Name/Age/Gender Regina Hernandez 86 y.o. female Room/Bed: ED15A/ED15A  Code Status   Code Status: DNR  Home/SNF/Other Home Patient oriented to: self, place, time and situation Is this baseline? Yes   Triage Complete: Triage complete  Chief Complaint Diarrhea [R19.7]  Triage Note Patient from home with daughter. Per daughter, patient has had increased weakness since this morning. Also reports episodes of confusion and staring into space. Patient was recently treated for cdiff and has seemed to have improved, until she started having diarrhea again two days ago. Patient hypotensive in triage.   Upon brought back to room in rolling walker. Patient requesting to go use restroom but encouraged by staff to get in bed first. Patient had syncopal episode in chair prior to moving to stretcher. Episode lasted approx 15 seconds. Was lifted into stretcher by staff. Patient slowly becoming responsive to voice at this time. EDP at bedside.   Pt arrived via POV d/t AMS and recent Cdiff. Pt had an episode of syncope after being placed in ED room. Pt daughter reports pt behaving different from baseline and low blood pressure at home.    Allergies Allergies  Allergen Reactions  . Adhesive [Tape] Other (See Comments)    Contact dermatitis  . Baclofen Rash  . Lyrica [Pregabalin] Other (See Comments)    Unable to remember reaction.  . Sulfa Antibiotics Rash    Level of Care/Admitting Diagnosis ED Disposition    ED Disposition Condition [facility] Area: [place] [100120]  Level of Care: Med-Surg [16]  Covid Evaluation: Asymptomatic Screening Protocol (No Symptoms)  Diagnosis: Diarrhea [787.91.ICD-9-CM]  Admitting Physician: Gary Fleet  Attending Physician: Gary Fleet  Estimated length of stay: 3 - 4 days  Certification:: I certify this patient will  need inpatient services for at least 2 midnights  Bed request comments: With telemetry       B Medical/Surgery History Past Medical History:  Diagnosis Date  . Anxiety   . Cervical dystonia   . Essential tremor   . GI bleed   . Memory loss   . Migraine   . Occipital neuralgia    Past Surgical History:  Procedure Laterality Date  . BOWEL RESECTION    . CARDIAC CATHETERIZATION  2016   no stents/[facility]  . ESOPHAGOGASTRODUODENOSCOPY N/A 06/18/2020   Procedure: ESOPHAGOGASTRODUODENOSCOPY (EGD);  Surgeon: Toledo, Benay Pike, MD;  Location: [facility] ENDOSCOPY;  Service: Gastroenterology;  Laterality: N/A;  . PACEMAKER IMPLANT    . REPLACEMENT TOTAL KNEE Left      A IV Location/Drains/Wounds Patient Lines/Drains/Airways Status    Active Line/Drains/Airways    Name Placement date Placement time Site Days   Peripheral IV 07/21/20 Left Forearm 07/21/20  --  Forearm  1          Intake/Output Last 24 hours  Intake/Output Summary (Last 24 hours) at 07/22/2020 1240 Last data filed at 07/21/2020 1259 Gross per 24 hour  Intake 1000 ml  Output --  Net 1000 ml    Labs/Imaging Results for orders placed or performed during the hospital encounter of 07/21/20 (from the past 48 hour(s))  CBC with Differential     Status: Abnormal   Collection Time: 07/21/20 10:56 AM  Result Value Ref Range   WBC 12.5 (H) 4.0 - 10.5 K/uL   RBC 3.97 3.87 - 5.11 MIL/uL   Hemoglobin 12.3 12.0 - 15.0  g/dL   HCT 37.1 36 - 46 %   MCV 93.5 80.0 - 100.0 fL   MCH 31.0 26.0 - 34.0 pg   MCHC 33.2 30.0 - 36.0 g/dL   RDW 15.2 11.5 - 15.5 %   Platelets 187 150 - 400 K/uL   nRBC 0.0 0.0 - 0.2 %   Neutrophils Relative % 79 %   Neutro Abs 9.9 (H) 1.7 - 7.7 K/uL   Lymphocytes Relative 13 %   Lymphs Abs 1.6 0.7 - 4.0 K/uL   Monocytes Relative 6 %   Monocytes Absolute 0.8 0 - 1 K/uL   Eosinophils Relative 0 %   Eosinophils Absolute 0.0 0 - 0 K/uL   Basophils Relative 1 %   Basophils Absolute  0.1 0 - 0 K/uL   Immature Granulocytes 1 %   Abs Immature Granulocytes 0.06 0.00 - 0.07 K/uL    Comment: Performed at [facility], [address] [place], [place] [address]  Comprehensive metabolic panel     Status: Abnormal   Collection Time: 07/21/20 10:56 AM  Result Value Ref Range   Sodium 134 (L) 135 - 145 mmol/L   Potassium 3.5 3.5 - 5.1 mmol/L   Chloride 101 98 - 111 mmol/L   CO2 25 22 - 32 mmol/L   Glucose, Bld 105 (H) 70 - 99 mg/dL    Comment: Glucose reference range applies only to samples taken after fasting for at least 8 hours.   BUN 18 8 - 23 mg/dL   Creatinine, Ser 0.99 0.44 - 1.00 mg/dL   Calcium 9.2 8.9 - 10.3 mg/dL   Total Protein 6.8 6.5 - 8.1 g/dL   Albumin 3.8 3.5 - 5.0 g/dL   AST 19 15 - 41 U/L   ALT 13 0 - 44 U/L   Alkaline Phosphatase 55 38 - 126 U/L   Total Bilirubin 1.0 0.3 - 1.2 mg/dL   GFR calc non Af Amer 52 (L) >60 mL/min   GFR calc Af Amer >60 >60 mL/min   Anion gap 8 5 - 15    Comment: Performed at [facility], [place], [place] [address]  Troponin I (High Sensitivity)     Status: None   Collection Time: 07/21/20 10:56 AM  Result Value Ref Range   Troponin I (High Sensitivity) 11 <18 ng/L    Comment: (NOTE) Elevated high sensitivity troponin I (hsTnI) values and significant  changes across serial measurements may suggest ACS but many other  chronic and acute conditions are known to elevate hsTnI results.  Refer to the "Links" section for chest pain algorithms and additional  guidance. Performed at [facility], [address] [place], [place] [address]   Urinalysis, Complete w Microscopic     Status: Abnormal   Collection Time: 07/21/20 11:30 AM  Result Value Ref Range   Color, Urine YELLOW (A) YELLOW   APPearance HAZY (A) CLEAR   Specific Gravity, Urine 1.017 1.005 - 1.030   pH 5.0 5.0 - 8.0   Glucose, UA NEGATIVE NEGATIVE mg/dL   Hgb urine dipstick MODERATE (A) NEGATIVE   Bilirubin Urine  NEGATIVE NEGATIVE   Ketones, ur NEGATIVE NEGATIVE mg/dL   Protein, ur NEGATIVE NEGATIVE mg/dL   Nitrite POSITIVE (A) NEGATIVE   Leukocytes,Ua SMALL (A) NEGATIVE   RBC / HPF 0-5 0 - 5 RBC/hpf   WBC, UA 21-50 0 - 5 WBC/hpf   Bacteria, UA MANY (A) NONE SEEN   Squamous Epithelial / LPF NONE  SEEN 0 - 5   WBC Clumps PRESENT    Mucus PRESENT    Hyaline Casts, UA PRESENT     Comment: Performed at [facility], [address] [place], [place] [address]  Gastrointestinal Panel by PCR , Stool     Status: None   Collection Time: 07/21/20 11:30 AM   Specimen: Stool  Result Value Ref Range   Campylobacter species NOT DETECTED NOT DETECTED   Plesimonas shigelloides NOT DETECTED NOT DETECTED   Salmonella species NOT DETECTED NOT DETECTED   Yersinia enterocolitica NOT DETECTED NOT DETECTED   Vibrio species NOT DETECTED NOT DETECTED   Vibrio cholerae NOT DETECTED NOT DETECTED   Enteroaggregative E coli (EAEC) NOT DETECTED NOT DETECTED   Enteropathogenic E coli (EPEC) NOT DETECTED NOT DETECTED   Enterotoxigenic E coli (ETEC) NOT DETECTED NOT DETECTED   Shiga like toxin producing E coli (STEC) NOT DETECTED NOT DETECTED   Shigella/Enteroinvasive E coli (EIEC) NOT DETECTED NOT DETECTED   Cryptosporidium NOT DETECTED NOT DETECTED   Cyclospora cayetanensis NOT DETECTED NOT DETECTED   Entamoeba histolytica NOT DETECTED NOT DETECTED   Giardia lamblia NOT DETECTED NOT DETECTED   Adenovirus F40/41 NOT DETECTED NOT DETECTED   Astrovirus NOT DETECTED NOT DETECTED   Norovirus GI/GII NOT DETECTED NOT DETECTED   Rotavirus A NOT DETECTED NOT DETECTED   Sapovirus (I, II, IV, and V) NOT DETECTED NOT DETECTED    Comment: Performed at [facility], [address] [place], [place] [address]  SARS Coronavirus 2 by RT PCR (hospital order, performed in [place] hospital lab) Nasopharyngeal Nasopharyngeal Swab     Status: None   Collection Time: 07/21/20 12:31 PM   Specimen: Nasopharyngeal Swab   Result Value Ref Range   SARS Coronavirus 2 NEGATIVE NEGATIVE    Comment: (NOTE) SARS-CoV-2 target nucleic acids are NOT DETECTED.  The SARS-CoV-2 RNA is generally detectable in upper and lower respiratory specimens during the acute phase of infection. The lowest concentration of SARS-CoV-2 viral copies this assay can detect is 250 copies / mL. A negative result does not preclude SARS-CoV-2 infection and should not be used as the sole basis for treatment or other patient management decisions.  A negative result may occur with improper specimen collection / handling, submission of specimen other than nasopharyngeal swab, presence of viral mutation(s) within the areas targeted by this assay, and inadequate number of viral copies (<250 copies / mL). A negative result must be combined with clinical observations, patient history, and epidemiological information.  Fact Sheet for Patients:   StrictlyIdeas.no  Fact Sheet for Healthcare Providers: BankingDealers.co.za  This test is not yet approved or  cleared by the [place] FDA and has been authorized for detection and/or diagnosis of SARS-CoV-2 by FDA under an Emergency Use Authorization (EUA).  This EUA will remain in effect (meaning this test can be used) for the duration of the COVID-19 declaration under Section 564(b)(1) of the Act, 21 U.S.C. section 360bbb-3(b)(1), unless the authorization is terminated or revoked sooner.  Performed at [facility], [place], [place] [address]   Troponin I (High Sensitivity)     Status: None   Collection Time: 07/21/20  2:05 PM  Result Value Ref Range   Troponin I (High Sensitivity) 9 <18 ng/L    Comment: (NOTE) Elevated high sensitivity troponin I (hsTnI) values and significant  changes across serial measurements may suggest ACS but many other  chronic and acute conditions are known to elevate hsTnI  results.  Refer  to the "Links" section for chest pain algorithms and additional  guidance. Performed at [facility], [address] [place], [place] [address]   Basic metabolic panel     Status: Abnormal   Collection Time: 07/22/20  6:22 AM  Result Value Ref Range   Sodium 137 135 - 145 mmol/L   Potassium 2.8 (L) 3.5 - 5.1 mmol/L   Chloride 107 98 - 111 mmol/L   CO2 23 22 - 32 mmol/L   Glucose, Bld 95 70 - 99 mg/dL    Comment: Glucose reference range applies only to samples taken after fasting for at least 8 hours.   BUN 9 8 - 23 mg/dL   Creatinine, Ser 0.72 0.44 - 1.00 mg/dL   Calcium 8.7 (L) 8.9 - 10.3 mg/dL   GFR calc non Af Amer >60 >60 mL/min   GFR calc Af Amer >60 >60 mL/min   Anion gap 7 5 - 15    Comment: Performed at [facility], [address] [place], [place] [address]  CBC     Status: Abnormal   Collection Time: 07/22/20  6:22 AM  Result Value Ref Range   WBC 9.2 4.0 - 10.5 K/uL   RBC 3.32 (L) 3.87 - 5.11 MIL/uL   Hemoglobin 10.5 (L) 12.0 - 15.0 g/dL   HCT 31.2 (L) 36 - 46 %   MCV 94.0 80.0 - 100.0 fL   MCH 31.6 26.0 - 34.0 pg   MCHC 33.7 30.0 - 36.0 g/dL   RDW 15.1 11.5 - 15.5 %   Platelets 137 (L) 150 - 400 K/uL    Comment: Immature Platelet Fraction may be clinically indicated, consider ordering this additional test [id number]    nRBC 0.0 0.0 - 0.2 %    Comment: Performed at [facility], [address], [place], [place] [address]  Magnesium     Status: None   Collection Time: 07/22/20  6:22 AM  Result Value Ref Range   Magnesium 1.7 1.7 - 2.4 mg/dL    Comment: Performed at [facility], [address] [place], [place] [address]   CT Head Wo Contrast  Result Date: 07/21/2020 CLINICAL DATA:  Altered mental status EXAM: CT HEAD WITHOUT CONTRAST TECHNIQUE: Contiguous axial images were obtained from the base of the skull through the vertex without intravenous contrast. COMPARISON:  02/15/2020 FINDINGS: Brain: No evidence of acute  infarction, hemorrhage, hydrocephalus, or extra-axial hemorrhage. Stable CSF density structure within the left middle cranial fossa likely reflecting an arachnoid cyst. Mild scattered low-density changes within the periventricular and subcortical white matter compatible with chronic microvascular ischemic change. Mild diffuse cerebral volume loss. Vascular: Atherosclerotic calcifications involving the large vessels of the skull base. No unexpected hyperdense vessel. Skull: Normal. Negative for fracture or focal lesion. Sinuses/Orbits: No acute finding. Other: None. IMPRESSION: 1. No acute intracranial findings. 2. Chronic microvascular ischemic change and cerebral volume loss. 3. Stable left middle cranial fossa cyst, likely arachnoid cyst. Electronically Signed   By: Davina Poke D.O.   On: 07/21/2020 11:25   DG Chest Portable 1 View  Result Date: 07/21/2020 CLINICAL DATA:  Syncope EXAM: PORTABLE CHEST 1 VIEW COMPARISON:  July 01, 2020 FINDINGS: There is stable scarring in the right base. Lungs elsewhere are clear. Heart is upper normal in size with pulmonary vascularity normal. Pacemaker leads attached to right atrium and right ventricle. No evident adenopathy. No bone lesions evident. IMPRESSION: No edema or consolidation. Scarring right base. Stable cardiac  silhouette. Pacemaker leads attached to right atrium and right ventricle. Electronically Signed   By: Lowella Grip III M.D.   On: 07/21/2020 12:16    Pending Labs Unresulted Labs (From admission, onward) Comment          Start     Ordered   07/28/20 0500  Creatinine, serum  (enoxaparin (LOVENOX)    CrCl >/= 30 ml/min)  Weekly,   STAT     Comments: while on enoxaparin therapy    07/21/20 1336          Vitals/Pain Today's Vitals   07/22/20 0730 07/22/20 0800 07/22/20 0900 07/22/20 1023  BP: 112/81 (!) 118/86 (!) 131/78   Pulse: 71 69 70   Resp: 23 22 15    Temp:      TempSrc:      SpO2: 95% 93% 96%   Weight:    58.1 kg   Height:      PainSc:        Isolation Precautions Enteric precautions (UV disinfection)  Medications Medications  acetaminophen (TYLENOL) tablet 1,000 mg (has no administration in time range)  midodrine (PROAMATINE) tablet 2.5 mg (2.5 mg Oral Given 07/22/20 0818)  memantine (NAMENDA) tablet 10 mg (10 mg Oral Given 07/22/20 1049)  risperiDONE (RISPERDAL) tablet 1 mg (1 mg Oral Given 07/21/20 2106)  venlafaxine XR (EFFEXOR-XR) 24 hr capsule 75 mg (75 mg Oral Given 07/22/20 1049)  fluvoxaMINE (LUVOX) tablet 50 mg (50 mg Oral Given 07/22/20 1050)  estradiol (ESTRACE) vaginal cream 1 Applicatorful (has no administration in time range)  vitamin B-12 (CYANOCOBALAMIN) tablet 1,000 mcg (1,000 mcg Oral Given 07/22/20 1049)  gabapentin (NEURONTIN) capsule 400 mg (400 mg Oral Given 07/22/20 1050)  primidone (MYSOLINE) tablet 100 mg (100 mg Oral Given 07/21/20 2107)  topiramate (TOPAMAX) tablet 50 mg (50 mg Oral Given 07/22/20 1048)  cholecalciferol (VITAMIN D3) tablet 25 mcg (25 mcg Oral Given 07/22/20 1048)  enoxaparin (LOVENOX) injection 40 mg (40 mg Subcutaneous Given 07/21/20 1507)  sodium chloride flush (NS) 0.9 % injection 3 mL ( Intravenous Canceled Entry 07/22/20 1050)  0.9 %  sodium chloride infusion ( Intravenous New Bag/Given 07/22/20 0940)  ondansetron (ZOFRAN) tablet 4 mg (has no administration in time range)    Or  ondansetron (ZOFRAN) injection 4 mg (has no administration in time range)  fidaxomicin (DIFICID) tablet 200 mg (200 mg Oral Given 07/22/20 1049)  potassium chloride SA (KLOR-CON) CR tablet 40 mEq (has no administration in time range)  magnesium sulfate IVPB 2 g 50 mL (has no administration in time range)  lactated ringers bolus 1,000 mL (0 mLs Intravenous Stopped 07/21/20 1259)  potassium chloride SA (KLOR-CON) CR tablet 40 mEq (40 mEq Oral Given 07/22/20 1048)    Mobility walks with person assist Low fall risk   Focused Assessments    R Recommendations: See Admitting  Provider Note  Report given to:

## 2020-07-22 NOTE — TOC Initial Note (Signed)
Transition of Care [facility]) - Initial/Assessment Note    Patient Details  Name: Regina Hernandez MRN: [id number] Date of Birth: September 06, 1934  [facility] CM/SW Contact:    Adelene Amas, [place] Phone Number: 07/22/2020, 12:46 PM  Clinical Narrative:                  Requested dosage and frequency information for medication (med assist consult) from Dr. Gwynneth Albright.  Pt does not have Medicare part D.       Patient Goals and CMS Choice        Expected Discharge Plan and Services                                                Prior Living Arrangements/Services                       Activities of Daily Living      Permission Sought/Granted                  Emotional Assessment              Admission diagnosis:  Diarrhea [R19.7] Patient Active Problem List   Diagnosis Date Noted  . Diarrhea 07/21/2020  . Acute colitis 07/01/2020  . AKI (acute kidney injury) ([place]) 07/01/2020  . Sepsis ([place]) 07/01/2020  . Ventricular tachycardia ([place]) 06/22/2020  . Syncopal episodes 06/21/2020  . Orthostatic hypotension 06/21/2020  . Acute GI bleeding 06/18/2020  . Melena 06/17/2020  . Essential tremor   . Arachnoid cyst 02/11/2020  . Chronic headaches 02/11/2020  . Coccydynia 02/11/2020  . H/O staphylococcal infection 02/11/2020  . Migraine 02/11/2020  . Vertigo 02/11/2020  . Memory loss 02/04/2020  . Gait abnormality 02/04/2020  . Depression 02/04/2020  . Confusion 02/04/2020  . Major depression with psychotic features ([place]) 10/31/2019  . History of suicide attempt 10/30/2019  . Iron deficiency anemia 05/22/2019  . Status post left knee replacement 11/20/2018  . Pain of cervical facet joint 07/13/2018  . Arthritis 06/01/2018  . Tremor 06/01/2018  . Spondylosis without myelopathy or radiculopathy, cervical region 01/19/2017  . Diverticulosis of large intestine without hemorrhage 11/30/2016  . Stage 3 chronic kidney disease 11/30/2016  .  Plantar fasciitis of left foot 09/29/2016  . Left knee pain 11/26/2014  . Polyp of colon 11/01/2013  . Hypertension 03/01/2012  . Pacemaker 01/26/2012  . History of endocarditis 09/15/2011  . Coronary atherosclerosis 08/17/2011  . Sick sinus syndrome ([place]) 08/17/2011  . Barrett's esophagus without dysplasia 07/20/2010  . Microscopic hematuria 07/20/2010  . Recurrent UTI 07/20/2010  . Vestibulitis, vulvar 07/20/2010   PCP:  Elby Beck, FNP Pharmacy:   [id number] [place], [place] [address] Phone: [phone] Fax: [phone]     Social Determinants of Health (SDOH) Interventions    Readmission Risk Interventions No flowsheet data found.

## 2020-07-22 NOTE — ED Notes (Signed)
Pt assisted to the restroom; pt back in bed; card monitor placed. No other needs voiced at this time.

## 2020-07-23 DIAGNOSIS — R197 Diarrhea, unspecified: Secondary | ICD-10-CM | POA: Diagnosis not present

## 2020-07-23 DIAGNOSIS — I951 Orthostatic hypotension: Secondary | ICD-10-CM | POA: Diagnosis not present

## 2020-07-23 DIAGNOSIS — A0472 Enterocolitis due to Clostridium difficile, not specified as recurrent: Secondary | ICD-10-CM | POA: Diagnosis not present

## 2020-07-23 DIAGNOSIS — R55 Syncope and collapse: Secondary | ICD-10-CM | POA: Diagnosis not present

## 2020-07-23 DIAGNOSIS — F329 Major depressive disorder, single episode, unspecified: Secondary | ICD-10-CM | POA: Diagnosis not present

## 2020-07-23 LAB — CBC
HCT: 30.7 % — ABNORMAL LOW (ref 36.0–46.0)
Hemoglobin: 10.2 g/dL — ABNORMAL LOW (ref 12.0–15.0)
MCH: 31.2 pg (ref 26.0–34.0)
MCHC: 33.2 g/dL (ref 30.0–36.0)
MCV: 93.9 fL (ref 80.0–100.0)
Platelets: 131 10*3/uL — ABNORMAL LOW (ref 150–400)
RBC: 3.27 MIL/uL — ABNORMAL LOW (ref 3.87–5.11)
RDW: 14.8 % (ref 11.5–15.5)
WBC: 6 10*3/uL (ref 4.0–10.5)
nRBC: 0 % (ref 0.0–0.2)

## 2020-07-23 LAB — CULTURE, URINE COMPREHENSIVE

## 2020-07-23 LAB — BASIC METABOLIC PANEL
Anion gap: 6 (ref 5–15)
BUN: 9 mg/dL (ref 8–23)
CO2: 22 mmol/L (ref 22–32)
Calcium: 8.7 mg/dL — ABNORMAL LOW (ref 8.9–10.3)
Chloride: 109 mmol/L (ref 98–111)
Creatinine, Ser: 0.7 mg/dL (ref 0.44–1.00)
GFR calc Af Amer: >60 mL/min (ref >60)
GFR calc non Af Amer: >60 mL/min (ref >60)
Glucose, Bld: 91 mg/dL (ref 70–99)
Potassium: 3.2 mmol/L — ABNORMAL LOW (ref 3.5–5.1)
Sodium: 137 mmol/L (ref 135–145)

## 2020-07-23 MED ORDER — POTASSIUM CHLORIDE CRYS ER 20 MEQ PO TBCR
40.0000 meq | EXTENDED_RELEASE_TABLET | Freq: Once | ORAL | Status: AC
  Administered 2020-07-23: 40 meq via ORAL
  Filled 2020-07-23: qty 2

## 2020-07-23 MED ORDER — ENSURE ENLIVE PO LIQD
237.0000 mL | Freq: Two times a day (BID) | ORAL | Status: DC
  Administered 2020-07-23 – 2020-07-24 (×2): 237 mL via ORAL

## 2020-07-23 MED ORDER — RISAQUAD PO CAPS
2.0000 | ORAL_CAPSULE | Freq: Every day | ORAL | Status: DC
  Administered 2020-07-23: 2 via ORAL
  Filled 2020-07-23: qty 2

## 2020-07-23 MED ORDER — ADULT MULTIVITAMIN W/MINERALS CH
1.0000 | ORAL_TABLET | Freq: Every day | ORAL | Status: DC
  Administered 2020-07-24: 1 via ORAL
  Filled 2020-07-23: qty 1

## 2020-07-23 MED ORDER — GABAPENTIN 300 MG PO CAPS
300.0000 mg | ORAL_CAPSULE | Freq: Three times a day (TID) | ORAL | Status: DC
  Administered 2020-07-23 – 2020-07-24 (×3): 300 mg via ORAL
  Filled 2020-07-23 (×3): qty 1

## 2020-07-23 NOTE — Progress Notes (Signed)
ID Doing better Diarrhea has improved 3 today- softer  Patient Vitals for the past 24 hrs:  BP Temp Temp src Pulse Resp SpO2  07/23/20 1217 (!) 153/89 98.5 F (36.9 C) -- 70 17 99 %  07/23/20 0315 106/75 -- -- 93 -- 97 %  07/23/20 0311 (!) 138/77 98.9 F (37.2 C) Oral 60 17 96 %  07/22/20 2008 (!) 134/70 98.2 F (36.8 C) Oral 61 16 96 %  07/22/20 1615 124/65 98.6 F (37 C) Oral 63 20 96 %   O/e awake and alert chst b/l air entry [id number] abd soft Cns non focal    CBC Latest Ref Rng & Units 07/23/2020 07/22/2020 07/21/2020  WBC 4.0 - 10.5 K/uL 6.0 9.2 12.5(H)  Hemoglobin 12.0 - 15.0 g/dL 10.2(L) 10.5(L) 12.3  Hematocrit 36 - 46 % 30.7(L) 31.2(L) 37.1  Platelets 150 - 400 K/uL 131(L) 137(L) 187    CMP Latest Ref Rng & Units 07/23/2020 07/22/2020 07/21/2020  Glucose 70 - 99 mg/dL 91 95 105(H)  BUN 8 - 23 mg/dL 9 9 18   Creatinine 0.44 - 1.00 mg/dL 0.70 0.72 0.99  Sodium 135 - 145 mmol/L 137 137 134(L)  Potassium 3.5 - 5.1 mmol/L 3.2(L) 2.8(L) 3.5  Chloride 98 - 111 mmol/L 109 107 101  CO2 22 - 32 mmol/L 22 23 25   Calcium 8.9 - 10.3 mg/dL 8.7(L) 8.7(L) 9.2  Total Protein 6.5 - 8.1 g/dL - - 6.8  Total Bilirubin 0.3 - 1.2 mg/dL - - 1.0  Alkaline Phos 38 - 126 U/L - - 55  AST 15 - 41 U/L - - 19  ALT 0 - 44 U/L - - 13    Impression/recommendation ?Diarrhea is due to C. difficile . Was initially treated with vancomycin during last hospitalization, so continue fidaxomicin for 10 days total. Normal WBC, normal creatinine, and albumin of 3.8   She is colonized with bacteria in the urine and should not be treated for asymptomatic bacteriuria.  As per patient she has never had urinary symptoms but was given antibiotics a month ago for presumed UTI.  Syncope: Orthostatic hypotension.  Patient is on polypharmacy.  Most of the medications that she is taking  Effexor, fluoxetine memantine, primidone Topamax, risperidone, gabapentin have potential to cause dizziness and orthostatic  hypotension.  At her age she should not be on this many medications.  Would seriously look into tapering and stopping these medications. She has been started on midodrine for orthostasis but the most important thing is to get her off the above medications that is causing the orthostasis.   History of intentional overdose with Ambien secondary to depression following her husband passing away   Sick sinus syndrome with pacemaker  History of left TKA  Dsicussed with her daughter on the phone in great detail  ID will sign off

## 2020-07-23 NOTE — Progress Notes (Signed)
PROGRESS NOTE    Regina Hernandez  WJX:[id number] DOB: [birth year] DOA: 07/21/2020 PCP: Elby Beck, FNP   Brief Narrative:  HPI: Regina Hernandez is a 86 y.o. female with medical history significant for essential tremor, anxiety, orthostatic hypotension with syncopal episodes who presents to the emergency room for evaluation of a 3-day history of diarrhea.  Patient was treated for C. difficile diarrhea about 3 weeks ago and states that her stools have become formed but over the last 3 days she developed loose watery stools which she describes as foul-smelling and similar to her last episode of C. difficile.  The stool did not contain blood.  She denies recent antibiotic use.  She complained of dizziness and lightheadedness as well as weakness but denies having any nausea or vomiting.  She denies having any chest pain or shortness of breath.  She denies having any fever but has had chills.  While in the emergency room patient had a witnessed syncopal episode.  She had a systolic blood pressure in the 60s and received a liter of IV fluids with improvement in her blood pressure. Labs reveal a white count of 12,000 with left shift, sodium of 134 and potassium of 3.5. Chest x-ray reviewed by me shows clear lungs Twelve-lead EKG shows sinus rhythm  7/28: Patient seen and examined.  Daughter at bedside.  Lengthy conversation regarding patient's polypharmacy.  Daughter was relating that the patient has greater than 6 outpatient providers.  Including a psychiatrist a primary care doctor and a gastroenterologist and cardiologist    Assessment & Plan:   Principal Problem:   Diarrhea Active Problems:   Depression   Syncopal episodes  Diarrhea Unsure if this is recurrence of C. difficile infection or another viral infection causing diarrhea Patient was recently admitted to the hospital from 7/6 till 7/9 and was discharged on oral vancomycin for total 10 days.  As per patient's daughter patient just  completed treatment of oral vancomycin last week and started having diarrhea again. Stool for GI panel is negative.  Started on Dificid on admission Infectious disease on consult Plan: Continue Dificid x10 days per ID recs presumed recurrent C. difficile colitis Replace electrolytes as needed   Hypokalemia Hypomagnesemia Electrolyte losses and acid-base disturbances likely secondary to GI losses Encourage p.o. intake Daily labs Replace potassium magnesium as needed  Abnormal UA Patient does not have any urinary symptoms.    Per ID recs this is likely  asymptomatic colonization.  No indication to treat  Depression Continue venlafaxine and risperidone at home dosage  Moderate protein calorie malnutrition Likely due to poor p.o. intake Body mass index is 21.97 kg/m.   Weakness Likely multifactorial, will consult PT for further evaluation  Polypharmacy History of syncope Orthostatic hypotension Patient is on quite a few medications with propensity to cause orthostasis and dizziness.  These include Effexor, fluoxetine, primidone, Topamax, Risperdal, gabapentin -Agree with ID that these medications are likely causing her symptoms -Had a long discussion with the patient and the daughter at bedside.  Apparently patient had severe depression after the death of her husband and I suppose it intentional overdose -She sees a psychiatrist and primary care doctor.  I have shared my concerns regarding her extensive medication list.  Patient will likely need outpatient monitoring and follow-up for titration off of these medications -Encourage patient to see her PCP and psychiatrist soon as possible post discharge and resolution of acute issues  Sick sinus syndrome status post pacemaker No acute issues Continue telemetry monitoring  DVT prophylaxis: Lovenox Code Status: DNR Family Communication: Daughter at bedside Disposition Plan: Status is: Inpatient  Remains inpatient  appropriate because:Inpatient level of care appropriate due to severity of illness   Dispo: The patient is from: Home              Anticipated d/c is to: SNF              Anticipated d/c date is: 2 days              Patient currently is not medically stable to d/c.         Consultants:   ID  Procedures:   None  Antimicrobials:   Dificid    Subjective: Chief complaint: Diarrhea Patient reports subjective improvement.  Diarrhea resolving.  Abdominal pain improving.  Able to tolerate p.o.  Objective: Vitals:   07/22/20 2008 07/23/20 0311 07/23/20 0315 07/23/20 1217  BP: (!) 134/70 (!) 138/77 106/75 (!) 153/89  Pulse: 61 60 93 70  Resp: 16 17  17   Temp: 98.2 F (36.8 C) 98.9 F (37.2 C)  98.5 F (36.9 C)  TempSrc: Oral Oral    SpO2: 96% 96% 97% 99%  Weight:      Height:        Intake/Output Summary (Last 24 hours) at 07/23/2020 1309 Last data filed at 07/23/2020 0817 Gross per 24 hour  Intake 4572.19 ml  Output 750 ml  Net 3822.19 ml   Filed Weights   07/21/20 1104 07/22/20 1023  Weight: 54.4 kg 58.1 kg    Examination:  General: No apparent distress, patient appears well HEENT: Normocephalic, atraumatic Neck, supple, trachea midline, no tenderness Heart: Regular rate and rhythm, S1/S2 normal, no murmurs Lungs: Clear to auscultation bilaterally, no adventitious sounds, normal work of breathing Abdomen: Soft, nondistended, tender to palpation epigastrium, hypoactive bowel sounds Extremities: Normal, atraumatic, no clubbing or cyanosis, normal muscle tone Skin: No rashes or lesions, normal color Neurologic: Cranial nerves grossly intact, sensation intact, alert and oriented x3 Psychiatric: Normal affect    Data Reviewed: I have personally reviewed following labs and imaging studies  CBC: Recent Labs  Lab 07/21/20 1056 07/22/20 0622 07/23/20 0415  WBC 12.5* 9.2 6.0  NEUTROABS 9.9*  --   --   HGB 12.3 10.5* 10.2*  HCT 37.1 31.2* 30.7*  MCV  93.5 94.0 93.9  PLT 187 137* 096*   Basic Metabolic Panel: Recent Labs  Lab 07/21/20 1056 07/22/20 0622 07/23/20 0415  NA 134* 137 137  K 3.5 2.8* 3.2*  CL 101 107 109  CO2 25 23 22   GLUCOSE 105* 95 91  BUN 18 9 9   CREATININE 0.99 0.72 0.70  CALCIUM 9.2 8.7* 8.7*  MG  --  1.7  --    GFR: Estimated Creatinine Clearance: 45.2 mL/min (by C-G formula based on SCr of 0.7 mg/dL). Liver Function Tests: Recent Labs  Lab 07/21/20 1056  AST 19  ALT 13  ALKPHOS 55  BILITOT 1.0  PROT 6.8  ALBUMIN 3.8   No results for input(s): LIPASE, AMYLASE in the last 168 hours. No results for input(s): AMMONIA in the last 168 hours. Coagulation Profile: No results for input(s): INR, PROTIME in the last 168 hours. Cardiac Enzymes: No results for input(s): CKTOTAL, CKMB, CKMBINDEX, TROPONINI in the last 168 hours. BNP (last 3 results) No results for input(s): PROBNP in the last 8760 hours. HbA1C: No results for input(s): HGBA1C in the last 72 hours. CBG: No results for input(s): GLUCAP in the  last 168 hours. Lipid Profile: No results for input(s): CHOL, HDL, LDLCALC, TRIG, CHOLHDL, LDLDIRECT in the last 72 hours. Thyroid Function Tests: No results for input(s): TSH, T4TOTAL, FREET4, T3FREE, THYROIDAB in the last 72 hours. Anemia Panel: No results for input(s): VITAMINB12, FOLATE, FERRITIN, TIBC, IRON, RETICCTPCT in the last 72 hours. Sepsis Labs: No results for input(s): PROCALCITON, LATICACIDVEN in the last 168 hours.  Recent Results (from the past 240 hour(s))  Microscopic Examination     Status: Abnormal   Collection Time: 07/18/20 12:57 PM   Urine  Result Value Ref Range Status   WBC, UA >30 (A) 0 - 5 /hpf Final   RBC 3-10 (A) 0 - 2 /hpf Final   Epithelial Cells (non renal) 0-10 0 - 10 /hpf Final   Renal Epithel, UA 0-10 (A) None seen /hpf Final   Casts Present (A) None seen /lpf Final   Cast Type Granular casts (A) N/A Final   Crystals Present (A) N/A Final   Crystal Type  Calcium Oxalate N/A Final   Bacteria, UA Many (A) None seen/Few Final  CULTURE, URINE COMPREHENSIVE     Status: Abnormal (Preliminary result)   Collection Time: 07/18/20  2:20 PM   Specimen: Urine   UR  Result Value Ref Range Status   Urine Culture, Comprehensive Preliminary report (A)  Preliminary   Organism ID, Bacteria Gram negative rods (A)  Preliminary    Comment: Greater than 100,000 colony forming units per mL   Organism ID, Bacteria Gram negative rods (A)  Preliminary    Comment: Greater than 100,000 colony forming units per mL  Gastrointestinal Panel by PCR , Stool     Status: None   Collection Time: 07/21/20 11:30 AM   Specimen: Stool  Result Value Ref Range Status   Campylobacter species NOT DETECTED NOT DETECTED Final   Plesimonas shigelloides NOT DETECTED NOT DETECTED Final   Salmonella species NOT DETECTED NOT DETECTED Final   Yersinia enterocolitica NOT DETECTED NOT DETECTED Final   Vibrio species NOT DETECTED NOT DETECTED Final   Vibrio cholerae NOT DETECTED NOT DETECTED Final   Enteroaggregative E coli (EAEC) NOT DETECTED NOT DETECTED Final   Enteropathogenic E coli (EPEC) NOT DETECTED NOT DETECTED Final   Enterotoxigenic E coli (ETEC) NOT DETECTED NOT DETECTED Final   Shiga like toxin producing E coli (STEC) NOT DETECTED NOT DETECTED Final   Shigella/Enteroinvasive E coli (EIEC) NOT DETECTED NOT DETECTED Final   Cryptosporidium NOT DETECTED NOT DETECTED Final   Cyclospora cayetanensis NOT DETECTED NOT DETECTED Final   Entamoeba histolytica NOT DETECTED NOT DETECTED Final   Giardia lamblia NOT DETECTED NOT DETECTED Final   Adenovirus F40/41 NOT DETECTED NOT DETECTED Final   Astrovirus NOT DETECTED NOT DETECTED Final   Norovirus GI/GII NOT DETECTED NOT DETECTED Final   Rotavirus A NOT DETECTED NOT DETECTED Final   Sapovirus (I, II, IV, and V) NOT DETECTED NOT DETECTED Final    Comment: Performed at [facility], [address] [place], [place] [address]    SARS Coronavirus 2 by RT PCR (hospital order, performed in [place] hospital lab) Nasopharyngeal Nasopharyngeal Swab     Status: None   Collection Time: 07/21/20 12:31 PM   Specimen: Nasopharyngeal Swab  Result Value Ref Range Status   SARS Coronavirus 2 NEGATIVE NEGATIVE Final    Comment: (NOTE) SARS-CoV-2 target nucleic acids are NOT DETECTED.  The SARS-CoV-2 RNA is generally detectable in upper and lower respiratory specimens during the acute phase of infection.  The lowest concentration of SARS-CoV-2 viral copies this assay can detect is 250 copies / mL. A negative result does not preclude SARS-CoV-2 infection and should not be used as the sole basis for treatment or other patient management decisions.  A negative result may occur with improper specimen collection / handling, submission of specimen other than nasopharyngeal swab, presence of viral mutation(s) within the areas targeted by this assay, and inadequate number of viral copies (<250 copies / mL). A negative result must be combined with clinical observations, patient history, and epidemiological information.  Fact Sheet for Patients:   StrictlyIdeas.no  Fact Sheet for Healthcare Providers: BankingDealers.co.za  This test is not yet approved or  cleared by the [place] FDA and has been authorized for detection and/or diagnosis of SARS-CoV-2 by FDA under an Emergency Use Authorization (EUA).  This EUA will remain in effect (meaning this test can be used) for the duration of the COVID-19 declaration under Section 564(b)(1) of the Act, 21 U.S.C. section 360bbb-3(b)(1), unless the authorization is terminated or revoked sooner.  Performed at [facility], [address], [place], [place] [address]          Radiology Studies: No results found.      Scheduled Meds: . acidophilus  2 capsule Oral Daily  . cholecalciferol  25 mcg Oral Daily  .  enoxaparin (LOVENOX) injection  40 mg Subcutaneous Q24H  . estradiol  1 Applicatorful Vaginal Once per day on Mon Wed Fri  . feeding supplement (ENSURE ENLIVE)  237 mL Oral BID BM  . fidaxomicin  200 mg Oral BID  . fluvoxaMINE  50 mg Oral BID  . gabapentin  300 mg Oral TID  . memantine  10 mg Oral BID  . midodrine  2.5 mg Oral TID WC  . [START ON 07/24/2020] multivitamin with minerals  1 tablet Oral Daily  . primidone  100 mg Oral QHS  . risperiDONE  1 mg Oral QHS  . sodium chloride flush  3 mL Intravenous Q12H  . topiramate  50 mg Oral TID  . venlafaxine XR  75 mg Oral Daily  . vitamin B-12  1,000 mcg Oral Daily   Continuous Infusions: . sodium chloride 100 mL/hr at 07/23/20 0817     LOS: 2 days    Time spent: 25 minutes    Sidney Ace, MD [facility] Pager [phone]  If 7PM-7AM, please contact night-coverage 07/23/2020, 1:09 PM

## 2020-07-23 NOTE — Evaluation (Signed)
Physical Therapy Evaluation Patient Details Name: Regina Hernandez MRN: [id number] DOB: 1934-01-10 Today's Date: 07/23/2020   History of Present Illness  Patient is 86 year old female with history of gastric ulcer, essential tremor, anxiety, orthostatic hypotension with syncope who present to the hospital with weakness, dizziness, and diarrhea. Found to have sepsis likely secondary to colitis, AKI, chronic orthostatic hypotension, anemia, hypokalemia, elevated troponin. History of orthostatic hypotension with syncope, anxiety, memory loss, dementia, cervical dystonia, essential tremor   Clinical Impression     Pt reports she uses 4-wheeled walker at home and lives with her 2 daughters.  Pt did not want to use FWW while in patient room, so CGA was utilized to maintain safe transfers and ambulation within the room.  Pt needed to void, and was able to transfer to commode with CGA.  No LOB noticed when performing functional activity and ambulation around the room.  Pt is steady with transfers, however advised to contact nursing if she needed to get up again to use the restroom.  Pt able to perform BLE exercises to maintain strength during stay in hospital and pt agreed.  Pt notes that she consistently performs ankle pumps while in bed.  Pt notes that she feels as though she is doing well and performing consistently with her baseline level of function and if she needs any assistance her two daughters are at home to give her aid.  No acute PT needs are identified at this time, and with recommendation of routine mobility with nursing supervision while in the hospital at this time.     Follow Up Recommendations No PT follow up;Supervision - Intermittent    Equipment Recommendations  None recommended by PT    Recommendations for Other Services       Precautions / Restrictions Precautions Precautions: None Restrictions Weight Bearing Restrictions: No      Mobility  Bed Mobility Overal bed  mobility: Modified Independent             General bed mobility comments: supine to and from sitting with head of bed slightly elevated   Transfers Overall transfer level: Modified independent   Transfers: Sit to/from Stand Sit to Stand: Modified independent (Device/Increase time)         General transfer comment: needs extra time, but good safety awareness with functional transfers without verbal cueing.  Ambulation/Gait Ambulation/Gait assistance: Min guard Gait Distance (Feet): 35 Feet Assistive device: None Gait Pattern/deviations: WFL(Within Functional Limits);Step-through pattern     General Gait Details: Gait distance limited as patient on enteric isolation precautions and did not exit room. No loss of balance with ambulaiton, no dizziness or lightheadedness reported.   Stairs            Wheelchair Mobility    Modified Rankin (Stroke Patients Only)       Balance   Sitting-balance support: No upper extremity supported;Feet supported Sitting balance-Leahy Scale: Normal Sitting balance - Comments: patient able to reach outside base of support without loss of balance    Standing balance support: No upper extremity supported Standing balance-Leahy Scale: Good                               Pertinent Vitals/Pain Pain Assessment: No/denies pain    Home Living Family/patient expects to be discharged to:: Private residence Living Arrangements: Children Available Help at Discharge: Available 24 hours/day;Family Type of Home: House Home Access: Ramped entrance     Home Layout: One  level Home Equipment: Walker - 4 wheels;Cane - single point;Wheelchair - manual;Bedside commode      Prior Function Level of Independence: Independent         Comments: patient reports no recent falls      Hand Dominance   Dominant Hand: Right    Extremity/Trunk Assessment   Upper Extremity Assessment Upper Extremity Assessment: Overall WFL for  tasks assessed    Lower Extremity Assessment Lower Extremity Assessment: Overall WFL for tasks assessed       Communication   Communication: No difficulties  Cognition Arousal/Alertness: Awake/alert Behavior During Therapy: WFL for tasks assessed/performed Overall Cognitive Status: Within Functional Limits for tasks assessed                                        General Comments      Exercises General Exercises - Lower Extremity Ankle Circles/Pumps: AROM;Both;10 reps;Strengthening;Supine Heel Slides: AROM;Strengthening;Both;10 reps;Supine Hip ABduction/ADduction: AROM;Strengthening;Both;10 reps;Supine Straight Leg Raises: AROM;Strengthening;Both;10 reps;Supine Hip Flexion/Marching: AROM;Strengthening;Both;10 reps;Seated Toe Raises: AROM;Strengthening;Both;10 reps;Seated Heel Raises: AROM;Strengthening;Both;10 reps;Seated   Assessment/Plan    PT Assessment Patent does not need any further PT services  PT Problem List         PT Treatment Interventions      PT Goals (Current goals can be found in the Care Plan section)  Acute Rehab PT Goals Patient Stated Goal: to go home PT Goal Formulation: With patient Time For Goal Achievement: 08/06/20 Potential to Achieve Goals: Good    Frequency     Barriers to discharge        Co-evaluation               AM-PAC PT "6 Clicks" Mobility  Outcome Measure Help needed turning from your back to your side while in a flat bed without using bedrails?: None Help needed moving from lying on your back to sitting on the side of a flat bed without using bedrails?: None Help needed moving to and from a bed to a chair (including a wheelchair)?: None Help needed standing up from a chair using your arms (e.g., wheelchair or bedside chair)?: None Help needed to walk in hospital room?: None Help needed climbing 3-5 steps with a railing? : None 6 Click Score: 24    End of Session Equipment Utilized During  Treatment: Gait belt Activity Tolerance: Patient tolerated treatment well Patient left: with call bell/phone within reach;in chair;with chair alarm set (chair alarm placed, but no box was present.  notified nursing and box was placed upon leaving.) Nurse Communication: Mobility status;Other (comment) (as mentioned above, nursing notified of room not having a chair alarm box.) PT Visit Diagnosis: Unsteadiness on feet (R26.81)    Time: 9211-9417 PT Time Calculation (min) (ACUTE ONLY): 30 min   Charges:   PT Evaluation $PT Eval Low Complexity: 1 Low PT Treatments $Therapeutic Exercise: 8-22 mins       Gwenlyn Saran, PT, DPT 07/23/20, 10:32 AM

## 2020-07-23 NOTE — Telephone Encounter (Signed)
-----   Message from Festus Aloe, MD sent at 07/23/2020  2:26 PM EDT ----- Let pt and daughter (daughter is the one who called yesterday) know the urine cx was negative but wouldn't recommend abx right now since she doesn't have bladder symptoms or pain. She is / was in hospital but for diarrhea and dehydration (not UTI). The hospital doctors are also going to try and not use any antibiotics.   ----- Message ----- From: Chrystie Nose, CMA Sent: 07/23/2020   1:38 PM EDT To: Festus Aloe, MD   ----- Message ----- From: Interface, [facility] Lab Results In Sent: 07/20/2020   9:35 AM EDT To: Rowe [facility]

## 2020-07-23 NOTE — Telephone Encounter (Signed)
Called pt's daughter informed her of the information. Daughter gave verbal understanding.

## 2020-07-23 NOTE — Progress Notes (Signed)
Initial Nutrition Assessment  DOCUMENTATION CODES:   Not applicable  INTERVENTION:   Ensure Enlive po BID, each supplement provides 350 kcal and 20 grams of protein  MVI daily  Recommend daily probiotics   NUTRITION DIAGNOSIS:   Inadequate oral intake related to acute illness as evidenced by per patient/family report.  GOAL:   Patient will meet greater than or equal to 90% of their needs  MONITOR:   PO intake, Supplement acceptance, Labs, Weight trends, Skin, I & O's  REASON FOR ASSESSMENT:   Malnutrition Screening Tool    ASSESSMENT:   86 y.o. with a history of endocarditis, recurrent UTIs, CKD III, Barrett's esophagus s/p dilation, iron anemia, bowel resection, gastric ulcers, major depression, migraine, occipital neuralgia, memory loss, history of left TKA, pacemaker and recent hospitalization for the C. difficile between 07/01/2020 until 07/04/2020 is admitted again with diarrhea and weakness   Met with pt in room today. Pt reports good appetite and oral intake pta and in hospital. Pt ate 100% of a bowl of cereal with milk for breakfast today. Pt reports that she does drink 1 vanilla Boost daily at home and is willing to drink Ensure while in hospital. Pt with h/o Barrett's and is s/p dilation. Pt denies any trouble swallowing at this time. Pt reports a 14lb weight loss over the past year. Per chart, pt has lost 4lbs(3%) over the past 3 months; this is not significant. Pt with chronic diarrhea. Pt reports a h/o bowel resection r/t polyps but she is unsure what portion of her bowel was removed. Pt may benefit from daily probiotics.   Medications reviewed and include: D3, lovenox, estrace, dificid, B12, NaCl _0 /hr  Labs reviewed: K 3.2(L) Hgb 10.2(L), Hct 30.7(L)  NUTRITION - FOCUSED PHYSICAL EXAM:    Most Recent Value  Orbital Region Moderate depletion  Upper Arm Region No depletion  Thoracic and Lumbar Region No depletion  Buccal Region Moderate depletion  Temple  Region Moderate depletion  Clavicle Bone Region No depletion  Clavicle and Acromion Bone Region No depletion  Scapular Bone Region No depletion  Dorsal Hand Severe depletion  Patellar Region Mild depletion  Anterior Thigh Region No depletion  Posterior Calf Region No depletion  Edema (RD Assessment) Mild  Hair Reviewed  Eyes Reviewed  Mouth Reviewed  Skin Reviewed  Nails Reviewed     Diet Order:   Diet Order            Diet 2 gram sodium Room service appropriate? Yes; Fluid consistency: Thin  Diet effective now                EDUCATION NEEDS:   Education needs have been addressed  Skin:  Skin Assessment: Reviewed RN Assessment (ecchymosis)  Last BM:  7/28- type 6  Height:   Ht Readings from Last 1 Encounters:  07/21/20 _1  (1.626 m)    Weight:   Wt Readings from Last 1 Encounters:  07/22/20 58.1 kg    Ideal Body Weight:  54.5 kg  BMI:  Body mass index is 21.97 kg/m.  Estimated Nutritional Needs:   Kcal:  1400-1600kcal/day  Protein:  70-80g/day  Fluid:  >1.4L/day  Koleen Distance MS, RD, LDN Please refer to [facility] for RD and/or RD on-call/weekend/after hours pager

## 2020-07-24 DIAGNOSIS — F329 Major depressive disorder, single episode, unspecified: Secondary | ICD-10-CM | POA: Diagnosis not present

## 2020-07-24 DIAGNOSIS — R55 Syncope and collapse: Secondary | ICD-10-CM | POA: Diagnosis not present

## 2020-07-24 DIAGNOSIS — A0472 Enterocolitis due to Clostridium difficile, not specified as recurrent: Secondary | ICD-10-CM | POA: Diagnosis not present

## 2020-07-24 DIAGNOSIS — R197 Diarrhea, unspecified: Secondary | ICD-10-CM | POA: Diagnosis not present

## 2020-07-24 LAB — MAGNESIUM: Magnesium: 1.9 mg/dL (ref 1.7–2.4)

## 2020-07-24 MED ORDER — FIDAXOMICIN 200 MG PO TABS: 200.0000 mg | ORAL_TABLET | Freq: Two times a day (BID) | ORAL | 0 refills | Status: AC

## 2020-07-24 MED ORDER — RISAQUAD PO CAPS: 2.0000 | ORAL_CAPSULE | Freq: Every day | ORAL | 0 refills | Status: AC

## 2020-07-24 NOTE — Discharge Summary (Signed)
Physician Discharge Summary  Syleena Mchan JIR:[id number] DOB: 1934-07-03 DOA: 07/21/2020  PCP: Elby Beck, FNP  Admit date: 07/21/2020 Discharge date: 07/24/2020  Admitted From: Home Disposition:  Home  Recommendations for Outpatient Follow-up:  1. Follow up with PCP in 1-2 weeks 2.   Home Health:No Equipment/Devices:None Discharge Condition:Stable CODE STATUS:Full Diet recommendation: Heart Healthy / Carb Modified Brief/Interim Summary: BPZ:WCHENIDP Hashis a 86 y.o.femalewith medical history significant foressential tremor, anxiety, orthostatic hypotension with syncopal episodes who presents to the emergency room for evaluation of a 3-day history of diarrhea. Patient was treated for C. difficile diarrhea about 3 weeks ago and states that her stools have become formed but over the last 3 days she developed loose watery stools which she describes as foul-smelling and similar to her last episode of C. difficile. The stool did not contain blood. She denies recent antibiotic use. She complained of dizziness and lightheadedness as well as weakness but denies having any nausea or vomiting. She denies having any chest pain or shortness of breath. She denies having any fever but has had chills.  While in the emergency room patient had a witnessed syncopal episode.She had a systolic blood pressure in the 60s and received a liter of IV fluids with improvement in her blood pressure. Labs reveal a white count of 12,000 with left shift, sodium of 134 and potassium of 3.5. Chest x-ray reviewed by me shows clear lungs Twelve-lead EKG shows sinus rhythm  7/28: Patient seen and examined.  Daughter at bedside.  Lengthy conversation regarding patient's polypharmacy.  Daughter was relating that the patient has greater than 6 outpatient providers.  Including a psychiatrist a primary care doctor and a gastroenterologist and cardiologist  7/29: Patient seen and examined.  No family at bedside  this morning.  Patient symptoms have resolved.  Diarrhea stopped.  Patient stable.  No abdominal pain.  Normal active bowel sounds.  Discussed with pharmacy and case management.  Fidaxomicin obtained without co-pay through patient pharmacy and insurance.  Will complete additional 6 days of p.o. fidaxomicin twice daily.  Also discussed with patient and daughter on 07/23/2020 regarding dangers of polypharmacy.  They agree to follow-up with primary care and psychiatry regarding substantial medication burden.  Discharge Diagnoses:  Principal Problem:   Diarrhea Active Problems:   Depression   Syncopal episodes  Diarrhea Unsure if this is recurrence of C. difficile infection or another viral infection causing diarrhea Patient was recently admitted to the hospital from 7/6 till 7/9 and was discharged on oral vancomycin for total 10 days. As per patient's daughter patient just completed treatment of oral vancomycin last week and started having diarrhea again. Stool for GI panel is negative.  Started on Dificid on admission Infectious disease on consult Plan: Continue Dificid x10 days per ID recs presumed recurrent C. difficile colitis Complete 4 days in house.  Additional 6 days prescribed on discharge   Hypokalemia Hypomagnesemia Electrolyte losses and acid-base disturbances likely secondary to GI losses Stabilized at time of discharge   Abnormal UA Patient does not have any urinary symptoms.   Per ID recs this is likely  asymptomatic colonization.  No indication to treat  Depression Continue venlafaxine and risperidone at home dosage  Moderate protein calorie malnutrition Likely due to poor p.o. intake Body mass index is 21.97 kg/m.  Weakness Likely multifactorial, will consult PT for further evaluation No PT follow-up recommended  Polypharmacy History of syncope Orthostatic hypotension Patient is on quite a few medications with propensity to cause orthostasis and  dizziness.  These include Effexor, fluoxetine, primidone, Topamax, Risperdal, gabapentin -Agree with ID that these medications are likely causing her symptoms -Had a long discussion with the patient and the daughter at bedside.  Apparently patient had severe depression after the death of her husband and possibly intentional overdose -She sees a psychiatrist and primary care doctor.  I have shared my concerns regarding her extensive medication list.  Patient will likely need outpatient monitoring and follow-up for titration off of these medications -Encourage patient to see her PCP and psychiatrist soon as possible post discharge and resolution of acute issues  Sick sinus syndrome status post pacemaker No acute issues Continue telemetry monitoring  Discharge Instructions  Discharge Instructions    Diet - low sodium heart healthy   Complete by: As directed    Increase activity slowly   Complete by: As directed      Allergies as of 07/24/2020      Reactions   Adhesive [tape] Other (See Comments)   Contact dermatitis   Baclofen Rash   Lyrica [pregabalin] Other (See Comments)   Unable to remember reaction.   Sulfa Antibiotics Rash      Medication List    TAKE these medications   acetaminophen 500 MG tablet Commonly known as: TYLENOL Take 2 tablets (1,000 mg total) by mouth every 8 (eight) hours as needed for mild pain or moderate pain.   acidophilus Caps capsule Take 2 capsules by mouth daily for 10 days.   estradiol 0.1 MG/GM vaginal cream Commonly known as: ESTRACE Place 1 Applicatorful vaginally as needed.   fidaxomicin 200 MG Tabs tablet Commonly known as: DIFICID Take 1 tablet (200 mg total) by mouth 2 (two) times daily for 6 days. Start taking on: July 25, 2020   fluvoxaMINE 50 MG tablet Commonly known as: LUVOX Take 50 mg by mouth 2 (two) times daily.   gabapentin 400 MG capsule Commonly known as: NEURONTIN Take 1 capsule (400 mg total) by mouth 3 (three) times  daily.   memantine 10 MG tablet Commonly known as: NAMENDA TAKE 1 TABLET BY MOUTH TWICE A DAY   midodrine 2.5 MG tablet Commonly known as: PROAMATINE Take 1 tablet (2.5 mg total) by mouth 3 (three) times daily with meals.   pantoprazole 40 MG tablet Commonly known as: PROTONIX Take 1 tablet (40 mg total) by mouth 2 (two) times daily.   primidone 50 MG tablet Commonly known as: MYSOLINE Take 2 tablets (100 mg total) by mouth at bedtime.   risperiDONE 1 MG tablet Commonly known as: RISPERDAL Take 1 mg by mouth at bedtime.   STOOL SOFTENER PO Take 1 capsule by mouth in the morning and at bedtime.   sucralfate 1 g tablet Commonly known as: CARAFATE Take 1 g by mouth 4 (four) times daily as needed.   topiramate 50 MG tablet Commonly known as: TOPAMAX Take 50 mg by mouth 3 (three) times daily.   venlafaxine XR 75 MG 24 hr capsule Commonly known as: EFFEXOR-XR Take 75 mg by mouth daily.   vitamin B-12 1000 MCG tablet Commonly known as: CYANOCOBALAMIN Take 1,000 mcg by mouth daily.   VITAMIN D3 PO Take 25 mcg by mouth daily.       Allergies  Allergen Reactions  . Adhesive [Tape] Other (See Comments)    Contact dermatitis  . Baclofen Rash  . Lyrica [Pregabalin] Other (See Comments)    Unable to remember reaction.  . Sulfa Antibiotics Rash    Consultations:  Infectious disease  Procedures/Studies: CT Head Wo Contrast  Result Date: 07/21/2020 CLINICAL DATA:  Altered mental status EXAM: CT HEAD WITHOUT CONTRAST TECHNIQUE: Contiguous axial images were obtained from the base of the skull through the vertex without intravenous contrast. COMPARISON:  02/15/2020 FINDINGS: Brain: No evidence of acute infarction, hemorrhage, hydrocephalus, or extra-axial hemorrhage. Stable CSF density structure within the left middle cranial fossa likely reflecting an arachnoid cyst. Mild scattered low-density changes within the periventricular and subcortical white matter compatible  with chronic microvascular ischemic change. Mild diffuse cerebral volume loss. Vascular: Atherosclerotic calcifications involving the large vessels of the skull base. No unexpected hyperdense vessel. Skull: Normal. Negative for fracture or focal lesion. Sinuses/Orbits: No acute finding. Other: None. IMPRESSION: 1. No acute intracranial findings. 2. Chronic microvascular ischemic change and cerebral volume loss. 3. Stable left middle cranial fossa cyst, likely arachnoid cyst. Electronically Signed   By: Davina Poke D.O.   On: 07/21/2020 11:25   DG Chest Portable 1 View  Result Date: 07/21/2020 CLINICAL DATA:  Syncope EXAM: PORTABLE CHEST 1 VIEW COMPARISON:  July 01, 2020 FINDINGS: There is stable scarring in the right base. Lungs elsewhere are clear. Heart is upper normal in size with pulmonary vascularity normal. Pacemaker leads attached to right atrium and right ventricle. No evident adenopathy. No bone lesions evident. IMPRESSION: No edema or consolidation. Scarring right base. Stable cardiac silhouette. Pacemaker leads attached to right atrium and right ventricle. Electronically Signed   By: Lowella Grip III M.D.   On: 07/21/2020 12:16   DG Chest Port 1 View  Result Date: 07/01/2020 CLINICAL DATA:  Tachycardia and hypotension EXAM: PORTABLE CHEST 1 VIEW COMPARISON:  June 22, 2020 FINDINGS: There is mild scarring in the right base. The lungs elsewhere are clear. Heart is upper normal in size with pulmonary vascularity normal. Pacemaker leads are attached to the right atrium and right ventricle. No adenopathy. No bone lesions. IMPRESSION: Mild scarring right base. No edema or airspace opacity. Heart upper normal in size. Pacemaker leads attached to right atrium and right ventricle. No adenopathy. Electronically Signed   By: Lowella Grip III M.D.   On: 07/01/2020 14:55    (Echo, Carotid, EGD, Colonoscopy, ERCP)    Subjective: Seen and examined at the time of discharge.  No distress, feels  well.  Stable for discharge home.  Discharge Exam: Vitals:   07/24/20 0549 07/24/20 1128  BP: (!) 135/80 (!) 181/94  Pulse: 65 68  Resp: 20 18  Temp: 98.8 F (37.1 C) 98.3 F (36.8 C)  SpO2: 95% 98%   Vitals:   07/23/20 1217 07/23/20 1946 07/24/20 0549 07/24/20 1128  BP: (!) 153/89 (!) 150/87 (!) 135/80 (!) 181/94  Pulse: 70 66 65 68  Resp: 17 18 20 18   Temp: 98.5 F (36.9 C) 98.3 F (36.8 C) 98.8 F (37.1 C) 98.3 F (36.8 C)  TempSrc:  Oral Oral Oral  SpO2: 99% 96% 95% 98%  Weight:      Height:        General: Pt is alert, awake, not in acute distress Cardiovascular: RRR, S1/S2 +, no rubs, no gallops Respiratory: CTA bilaterally, no wheezing, no rhonchi Abdominal: Soft, NT, ND, bowel sounds + Extremities: no edema, no cyanosis    The results of significant diagnostics from this hospitalization (including imaging, microbiology, ancillary and laboratory) are listed below for reference.     Microbiology: Recent Results (from the past 240 hour(s))  Microscopic Examination     Status: Abnormal   Collection Time: 07/18/20 12:57  PM   Urine  Result Value Ref Range Status   WBC, UA >30 (A) 0 - 5 /hpf Final   RBC 3-10 (A) 0 - 2 /hpf Final   Epithelial Cells (non renal) 0-10 0 - 10 /hpf Final   Renal Epithel, UA 0-10 (A) None seen /hpf Final   Casts Present (A) None seen /lpf Final   Cast Type Granular casts (A) N/A Final   Crystals Present (A) N/A Final   Crystal Type Calcium Oxalate N/A Final   Bacteria, UA Many (A) None seen/Few Final  CULTURE, URINE COMPREHENSIVE     Status: Abnormal   Collection Time: 07/18/20  2:20 PM   Specimen: Urine   UR  Result Value Ref Range Status   Urine Culture, Comprehensive Final report (A)  Final   Organism ID, Bacteria Klebsiella aerogenes (A)  Final    Comment: Greater than 100,000 colony forming units per mL formerly Enterobacter aerogenes    ANTIMICROBIAL SUSCEPTIBILITY Comment  Final    Comment:       ** S = Susceptible;  I = Intermediate; R = Resistant **                    P = Positive; N = Negative             MICS are expressed in micrograms per mL    Antibiotic                 RSLT#1    RSLT#2    RSLT#3    RSLT#4 Amoxicillin/Clavulanic Acid    R Cefazolin                      R Cefepime                       S Ceftriaxone                    S Cefuroxime                     R Ciprofloxacin                  S Ertapenem                      S Gentamicin                     S Imipenem                       S Levofloxacin                   S Meropenem                      S Nitrofurantoin                 S Tetracycline                   S Tobramycin                     S Trimethoprim/Sulfa             S   Gastrointestinal Panel by PCR , Stool     Status: None   Collection Time: 07/21/20 11:30 AM   Specimen: Stool  Result Value Ref Range  Status   Campylobacter species NOT DETECTED NOT DETECTED Final   Plesimonas shigelloides NOT DETECTED NOT DETECTED Final   Salmonella species NOT DETECTED NOT DETECTED Final   Yersinia enterocolitica NOT DETECTED NOT DETECTED Final   Vibrio species NOT DETECTED NOT DETECTED Final   Vibrio cholerae NOT DETECTED NOT DETECTED Final   Enteroaggregative E coli (EAEC) NOT DETECTED NOT DETECTED Final   Enteropathogenic E coli (EPEC) NOT DETECTED NOT DETECTED Final   Enterotoxigenic E coli (ETEC) NOT DETECTED NOT DETECTED Final   Shiga like toxin producing E coli (STEC) NOT DETECTED NOT DETECTED Final   Shigella/Enteroinvasive E coli (EIEC) NOT DETECTED NOT DETECTED Final   Cryptosporidium NOT DETECTED NOT DETECTED Final   Cyclospora cayetanensis NOT DETECTED NOT DETECTED Final   Entamoeba histolytica NOT DETECTED NOT DETECTED Final   Giardia lamblia NOT DETECTED NOT DETECTED Final   Adenovirus F40/41 NOT DETECTED NOT DETECTED Final   Astrovirus NOT DETECTED NOT DETECTED Final   Norovirus GI/GII NOT DETECTED NOT DETECTED Final   Rotavirus A NOT DETECTED NOT DETECTED  Final   Sapovirus (I, II, IV, and V) NOT DETECTED NOT DETECTED Final    Comment: Performed at [facility], [address] [place], [place] [address]  SARS Coronavirus 2 by RT PCR (hospital order, performed in [place] hospital lab) Nasopharyngeal Nasopharyngeal Swab     Status: None   Collection Time: 07/21/20 12:31 PM   Specimen: Nasopharyngeal Swab  Result Value Ref Range Status   SARS Coronavirus 2 NEGATIVE NEGATIVE Final    Comment: (NOTE) SARS-CoV-2 target nucleic acids are NOT DETECTED.  The SARS-CoV-2 RNA is generally detectable in upper and lower respiratory specimens during the acute phase of infection. The lowest concentration of SARS-CoV-2 viral copies this assay can detect is 250 copies / mL. A negative result does not preclude SARS-CoV-2 infection and should not be used as the sole basis for treatment or other patient management decisions.  A negative result may occur with improper specimen collection / handling, submission of specimen other than nasopharyngeal swab, presence of viral mutation(s) within the areas targeted by this assay, and inadequate number of viral copies (<250 copies / mL). A negative result must be combined with clinical observations, patient history, and epidemiological information.  Fact Sheet for Patients:   StrictlyIdeas.no  Fact Sheet for Healthcare Providers: BankingDealers.co.za  This test is not yet approved or  cleared by the [place] FDA and has been authorized for detection and/or diagnosis of SARS-CoV-2 by FDA under an Emergency Use Authorization (EUA).  This EUA will remain in effect (meaning this test can be used) for the duration of the COVID-19 declaration under Section 564(b)(1) of the Act, 21 U.S.C. section 360bbb-3(b)(1), unless the authorization is terminated or revoked sooner.  Performed at [facility], [address] [place], [place] [address]    Urine Culture     Status: Abnormal (Preliminary result)   Collection Time: 07/22/20  4:42 PM   Specimen: Urine, Random  Result Value Ref Range Status   Specimen Description   Final    URINE, RANDOM Performed at [facility], [address], [place], [place] [address]    Special Requests   Final    NONE Performed at [facility], [address], [place], [place] [address]    Culture (A)  Final    >=100,000 COLONIES/mL ENTEROBACTER AEROGENES SUSCEPTIBILITIES TO FOLLOW Performed at [facility], [place] [address], [place], [place] [address]  Report Status PENDING  Incomplete     Labs: BNP (last 3 results) Recent Labs    07/01/20 1040  BNP 270.3*   Basic Metabolic Panel: Recent Labs  Lab 07/21/20 1056 07/22/20 0622 07/23/20 0415 07/24/20 0532  NA 134* 137 137  --   K 3.5 2.8* 3.2*  --   CL 101 107 109  --   CO2 25 23 22   --   GLUCOSE 105* 95 91  --   BUN 18 9 9   --   CREATININE 0.99 0.72 0.70  --   CALCIUM 9.2 8.7* 8.7*  --   MG  --  1.7  --  1.9   Liver Function Tests: Recent Labs  Lab 07/21/20 1056  AST 19  ALT 13  ALKPHOS 55  BILITOT 1.0  PROT 6.8  ALBUMIN 3.8   No results for input(s): LIPASE, AMYLASE in the last 168 hours. No results for input(s): AMMONIA in the last 168 hours. CBC: Recent Labs  Lab 07/21/20 1056 07/22/20 0622 07/23/20 0415  WBC 12.5* 9.2 6.0  NEUTROABS 9.9*  --   --   HGB 12.3 10.5* 10.2*  HCT 37.1 31.2* 30.7*  MCV 93.5 94.0 93.9  PLT 187 137* 131*   Cardiac Enzymes: No results for input(s): CKTOTAL, CKMB, CKMBINDEX, TROPONINI in the last 168 hours. BNP: Invalid input(s): POCBNP CBG: No results for input(s): GLUCAP in the last 168 hours. D-Dimer No results for input(s): DDIMER in the last 72 hours. Hgb A1c No results for input(s): HGBA1C in the last 72 hours. Lipid Profile No results for input(s): CHOL, HDL, LDLCALC, TRIG, CHOLHDL, LDLDIRECT in the last 72 hours. Thyroid function  studies No results for input(s): TSH, T4TOTAL, T3FREE, THYROIDAB in the last 72 hours.  Invalid input(s): FREET3 Anemia work up No results for input(s): VITAMINB12, FOLATE, FERRITIN, TIBC, IRON, RETICCTPCT in the last 72 hours. Urinalysis    Component Value Date/Time   COLORURINE YELLOW (A) 07/21/2020 1130   APPEARANCEUR HAZY (A) 07/21/2020 1130   APPEARANCEUR Cloudy (A) 07/18/2020 1257   LABSPEC 1.017 07/21/2020 1130   PHURINE 5.0 07/21/2020 1130   GLUCOSEU NEGATIVE 07/21/2020 1130   HGBUR MODERATE (A) 07/21/2020 1130   BILIRUBINUR NEGATIVE 07/21/2020 1130   BILIRUBINUR Negative 07/18/2020 1257   KETONESUR NEGATIVE 07/21/2020 1130   PROTEINUR NEGATIVE 07/21/2020 1130   NITRITE POSITIVE (A) 07/21/2020 1130   LEUKOCYTESUR SMALL (A) 07/21/2020 1130   Sepsis Labs Invalid input(s): PROCALCITONIN,  WBC,  LACTICIDVEN Microbiology Recent Results (from the past 240 hour(s))  Microscopic Examination     Status: Abnormal   Collection Time: 07/18/20 12:57 PM   Urine  Result Value Ref Range Status   WBC, UA >30 (A) 0 - 5 /hpf Final   RBC 3-10 (A) 0 - 2 /hpf Final   Epithelial Cells (non renal) 0-10 0 - 10 /hpf Final   Renal Epithel, UA 0-10 (A) None seen /hpf Final   Casts Present (A) None seen /lpf Final   Cast Type Granular casts (A) N/A Final   Crystals Present (A) N/A Final   Crystal Type Calcium Oxalate N/A Final   Bacteria, UA Many (A) None seen/Few Final  CULTURE, URINE COMPREHENSIVE     Status: Abnormal   Collection Time: 07/18/20  2:20 PM   Specimen: Urine   UR  Result Value Ref Range Status   Urine Culture, Comprehensive Final report (A)  Final   Organism ID, Bacteria Klebsiella aerogenes (A)  Final    Comment: Greater  than 100,000 colony forming units per mL formerly Enterobacter aerogenes    ANTIMICROBIAL SUSCEPTIBILITY Comment  Final    Comment:       ** S = Susceptible; I = Intermediate; R = Resistant **                    P = Positive; N = Negative              MICS are expressed in micrograms per mL    Antibiotic                 RSLT#1    RSLT#2    RSLT#3    RSLT#4 Amoxicillin/Clavulanic Acid    R Cefazolin                      R Cefepime                       S Ceftriaxone                    S Cefuroxime                     R Ciprofloxacin                  S Ertapenem                      S Gentamicin                     S Imipenem                       S Levofloxacin                   S Meropenem                      S Nitrofurantoin                 S Tetracycline                   S Tobramycin                     S Trimethoprim/Sulfa             S   Gastrointestinal Panel by PCR , Stool     Status: None   Collection Time: 07/21/20 11:30 AM   Specimen: Stool  Result Value Ref Range Status   Campylobacter species NOT DETECTED NOT DETECTED Final   Plesimonas shigelloides NOT DETECTED NOT DETECTED Final   Salmonella species NOT DETECTED NOT DETECTED Final   Yersinia enterocolitica NOT DETECTED NOT DETECTED Final   Vibrio species NOT DETECTED NOT DETECTED Final   Vibrio cholerae NOT DETECTED NOT DETECTED Final   Enteroaggregative E coli (EAEC) NOT DETECTED NOT DETECTED Final   Enteropathogenic E coli (EPEC) NOT DETECTED NOT DETECTED Final   Enterotoxigenic E coli (ETEC) NOT DETECTED NOT DETECTED Final   Shiga like toxin producing E coli (STEC) NOT DETECTED NOT DETECTED Final   Shigella/Enteroinvasive E coli (EIEC) NOT DETECTED NOT DETECTED Final   Cryptosporidium NOT DETECTED NOT DETECTED Final   Cyclospora cayetanensis NOT DETECTED NOT DETECTED Final   Entamoeba histolytica NOT DETECTED NOT DETECTED Final   Giardia lamblia NOT DETECTED NOT DETECTED Final   Adenovirus F40/41  NOT DETECTED NOT DETECTED Final   Astrovirus NOT DETECTED NOT DETECTED Final   Norovirus GI/GII NOT DETECTED NOT DETECTED Final   Rotavirus A NOT DETECTED NOT DETECTED Final   Sapovirus (I, II, IV, and V) NOT DETECTED NOT DETECTED Final    Comment: Performed at  [facility], [address] [place], [place] [address]  SARS Coronavirus 2 by RT PCR (hospital order, performed in [place] hospital lab) Nasopharyngeal Nasopharyngeal Swab     Status: None   Collection Time: 07/21/20 12:31 PM   Specimen: Nasopharyngeal Swab  Result Value Ref Range Status   SARS Coronavirus 2 NEGATIVE NEGATIVE Final    Comment: (NOTE) SARS-CoV-2 target nucleic acids are NOT DETECTED.  The SARS-CoV-2 RNA is generally detectable in upper and lower respiratory specimens during the acute phase of infection. The lowest concentration of SARS-CoV-2 viral copies this assay can detect is 250 copies / mL. A negative result does not preclude SARS-CoV-2 infection and should not be used as the sole basis for treatment or other patient management decisions.  A negative result may occur with improper specimen collection / handling, submission of specimen other than nasopharyngeal swab, presence of viral mutation(s) within the areas targeted by this assay, and inadequate number of viral copies (<250 copies / mL). A negative result must be combined with clinical observations, patient history, and epidemiological information.  Fact Sheet for Patients:   StrictlyIdeas.no  Fact Sheet for Healthcare Providers: BankingDealers.co.za  This test is not yet approved or  cleared by the [place] FDA and has been authorized for detection and/or diagnosis of SARS-CoV-2 by FDA under an Emergency Use Authorization (EUA).  This EUA will remain in effect (meaning this test can be used) for the duration of the COVID-19 declaration under Section 564(b)(1) of the Act, 21 U.S.C. section 360bbb-3(b)(1), unless the authorization is terminated or revoked sooner.  Performed at [facility], [address] [place], [place] [address]   Urine Culture     Status: Abnormal (Preliminary result)   Collection Time: 07/22/20  4:42 PM    Specimen: Urine, Random  Result Value Ref Range Status   Specimen Description   Final    URINE, RANDOM Performed at [facility], [address], [place], [place] [address]    Special Requests   Final    NONE Performed at [facility], [address], [place], [place] [address]    Culture (A)  Final    >=100,000 COLONIES/mL ENTEROBACTER AEROGENES SUSCEPTIBILITIES TO FOLLOW Performed at [facility], [place] [address] [place], [place] [address]    Report Status PENDING  Incomplete     Time coordinating discharge: Over 30 minutes  SIGNED:   Sidney Ace, MD  [facility] 07/24/2020, 11:46 AM Pager   If 7PM-7AM, please contact night-coverage

## 2020-07-24 NOTE — TOC Initial Note (Signed)
Transition of Care [facility]) - Initial/Assessment Note    Patient Details  Name: Regina Hernandez MRN: [id number] Date of Birth: Jul 12, 1934  Transition of Care [facility]) CM/SW Contact:    Beverly Sessions, RN Phone Number: 07/24/2020, 2:43 PM  Clinical Narrative:                 Patient admitted from home with diarrhea Lives at home with daughter Daughter provides transportation  PCP Jal.  [facility] - patient denies issues obtaining home medications    Patient to discharge on fidaxomicin .  Per pharmacy Per insurance,  Copay = $0539  Application sent to manufacturer, [place], and she was approved for free 10-day supply of fidaxomicin 200mg  tabs taken BID.   Medication will be delivered to home tomorrow   PT has assessed patient and recommends no PT follow up Patient has rollator, cane, and WC in the home   Expected Discharge Plan: Home/Self Care Barriers to Discharge: No Barriers Identified   Patient Goals and CMS Choice        Expected Discharge Plan and Services Expected Discharge Plan: Home/Self Care   Discharge Planning Services: CM Consult   Living arrangements for the past 2 months: Single Family Home Expected Discharge Date: 07/24/20                                    Prior Living Arrangements/Services Living arrangements for the past 2 months: Single Family Home Lives with:: Adult Children          Need for Family Participation in Patient Care: Yes (Comment) Care giver support system in place?: Yes (comment) Current home services: DME    Activities of Daily Living Home Assistive Devices/Equipment: None ADL Screening (condition at time of admission) Patient's cognitive ability adequate to safely complete daily activities?: Yes Is the patient deaf or have difficulty hearing?: Yes Does the patient have difficulty seeing, even when wearing glasses/contacts?: Yes Does the patient have difficulty concentrating, remembering, or making decisions?:  No Patient able to express need for assistance with ADLs?: Yes Does the patient have difficulty dressing or bathing?: No Independently performs ADLs?: No Communication: Independent Dressing (OT): Needs assistance Is this a change from baseline?: Change from baseline, expected to last <3days Grooming: Independent Feeding: Independent Bathing: Needs assistance Is this a change from baseline?: Change from baseline, expected to last <3 days Toileting: Needs assistance Is this a change from baseline?: Change from baseline, expected to last <3 days In/Out Bed: Needs assistance Is this a change from baseline?: Change from baseline, expected to last <3 days Walks in Home: Needs assistance Is this a change from baseline?: Change from baseline, expected to last <3 days Does the patient have difficulty walking or climbing stairs?: Yes Weakness of Legs: Both Weakness of Arms/Hands: Both  Permission Sought/Granted                  Emotional Assessment Appearance:: Appears stated age     Orientation: : Oriented to Self, Oriented to Place, Oriented to  Time, Oriented to Situation Alcohol / Substance Use: Not Applicable Psych Involvement: No (comment)  Admission diagnosis:  Diarrhea of presumed infectious origin [R19.7] Diarrhea [R19.7] Generalized weakness [R53.1] Patient Active Problem List   Diagnosis Date Noted  . Diarrhea 07/21/2020  . Acute colitis 07/01/2020  . AKI (acute kidney injury) ([place]) 07/01/2020  . Sepsis ([place]) 07/01/2020  . Ventricular tachycardia ([place]) 06/22/2020  .  Syncopal episodes 06/21/2020  . Orthostatic hypotension 06/21/2020  . Acute GI bleeding 06/18/2020  . Melena 06/17/2020  . Essential tremor   . Arachnoid cyst 02/11/2020  . Chronic headaches 02/11/2020  . Coccydynia 02/11/2020  . H/O staphylococcal infection 02/11/2020  . Migraine 02/11/2020  . Vertigo 02/11/2020  . Memory loss 02/04/2020  . Gait abnormality 02/04/2020  . Depression 02/04/2020   . Confusion 02/04/2020  . Major depression with psychotic features ([place]) 10/31/2019  . History of suicide attempt 10/30/2019  . Iron deficiency anemia 05/22/2019  . Status post left knee replacement 11/20/2018  . Pain of cervical facet joint 07/13/2018  . Arthritis 06/01/2018  . Tremor 06/01/2018  . Spondylosis without myelopathy or radiculopathy, cervical region 01/19/2017  . Diverticulosis of large intestine without hemorrhage 11/30/2016  . Stage 3 chronic kidney disease 11/30/2016  . Plantar fasciitis of left foot 09/29/2016  . Left knee pain 11/26/2014  . Polyp of colon 11/01/2013  . Hypertension 03/01/2012  . Pacemaker 01/26/2012  . History of endocarditis 09/15/2011  . Coronary atherosclerosis 08/17/2011  . Sick sinus syndrome ([place]) 08/17/2011  . Barrett's esophagus without dysplasia 07/20/2010  . Microscopic hematuria 07/20/2010  . Recurrent UTI 07/20/2010  . Vestibulitis, vulvar 07/20/2010   PCP:  Elby Beck, FNP Pharmacy:   [id number] [place] [address] [place] [address] Phone: [phone] Fax: [phone]     Social Determinants of Health (SDOH) Interventions    Readmission Risk Interventions Readmission Risk Prevention Plan 07/24/2020  Transportation Screening Complete  HRI or [place] (No Data)  Palliative Care Screening Not Applicable  Medication Review (RN Care Manager) Complete

## 2020-07-24 NOTE — Progress Notes (Signed)
Regina Hernandez to be D/C'd home with daughter per MD order.  Discussed prescriptions and follow up appointments with the patient. Prescriptions given to patient, medication list explained in detail. Pt verbalized understanding.  Allergies as of 07/24/2020       Reactions   Adhesive [tape] Other (See Comments)   Contact dermatitis   Baclofen Rash   Lyrica [pregabalin] Other (See Comments)   Unable to remember reaction.   Sulfa Antibiotics Rash        Medication List     TAKE these medications    acetaminophen 500 MG tablet Commonly known as: TYLENOL Take 2 tablets (1,000 mg total) by mouth every 8 (eight) hours as needed for mild pain or moderate pain.   acidophilus Caps capsule Take 2 capsules by mouth daily for 10 days.   estradiol 0.1 MG/GM vaginal cream Commonly known as: ESTRACE Place 1 Applicatorful vaginally as needed.   fidaxomicin 200 MG Tabs tablet Commonly known as: DIFICID Take 1 tablet (200 mg total) by mouth 2 (two) times daily for 6 days. Start taking on: July 25, 2020   fluvoxaMINE 50 MG tablet Commonly known as: LUVOX Take 50 mg by mouth 2 (two) times daily.   gabapentin 400 MG capsule Commonly known as: NEURONTIN Take 1 capsule (400 mg total) by mouth 3 (three) times daily.   memantine 10 MG tablet Commonly known as: NAMENDA TAKE 1 TABLET BY MOUTH TWICE A DAY   midodrine 2.5 MG tablet Commonly known as: PROAMATINE Take 1 tablet (2.5 mg total) by mouth 3 (three) times daily with meals.   pantoprazole 40 MG tablet Commonly known as: PROTONIX Take 1 tablet (40 mg total) by mouth 2 (two) times daily.   primidone 50 MG tablet Commonly known as: MYSOLINE Take 2 tablets (100 mg total) by mouth at bedtime.   risperiDONE 1 MG tablet Commonly known as: RISPERDAL Take 1 mg by mouth at bedtime.   STOOL SOFTENER PO Take 1 capsule by mouth in the morning and at bedtime.   sucralfate 1 g tablet Commonly known as: CARAFATE Take 1 g by mouth 4 (four)  times daily as needed.   topiramate 50 MG tablet Commonly known as: TOPAMAX Take 50 mg by mouth 3 (three) times daily.   venlafaxine XR 75 MG 24 hr capsule Commonly known as: EFFEXOR-XR Take 75 mg by mouth daily.   vitamin B-12 1000 MCG tablet Commonly known as: CYANOCOBALAMIN Take 1,000 mcg by mouth daily.   VITAMIN D3 PO Take 25 mcg by mouth daily.        Vitals:   07/24/20 0549 07/24/20 1128  BP: (!) 135/80 (!) 181/94  Pulse: 65 68  Resp: 20 18  Temp: 98.8 F (37.1 C) 98.3 F (36.8 C)  SpO2: 95% 98%    Skin clean, dry and intact without evidence of skin break down, no evidence of skin tears noted. IV catheter discontinued intact. Site without signs and symptoms of complications. Dressing and pressure applied. Pt denies pain at this time. No complaints noted.  An After Visit Summary was printed and given to the patient. Patient escorted via [place], and D/C home via private auto.  Regina Hernandez

## 2020-07-24 NOTE — Care Management Important Message (Signed)
Important Message  Patient Details  Name: Lennox Leikam MRN: [id number] Date of Birth: 12-03-1934   Medicare Important Message Given:  Yes  Reviewed with patient via room phone due to isolation status.     Dannette Barbara 07/24/2020, 11:58 AM

## 2020-07-24 NOTE — Progress Notes (Signed)
IV removed from patient. Discharge instructions given to patient. Verbalized understanding. No acute distress at this time. Daughter to transport patient home.

## 2020-07-24 NOTE — Progress Notes (Signed)
Pharmacy - Antimicrobial Stewardship (Medication retrieval/patient assistance)  Patient on fidaxomicin for ongoing diarrhea seconday to C. Difficile initially diagnosed earlier this month (s/p PO vancomycin).    Per insurance,  Copay = $4388  Application sent to manufacturer, [employer], and she was approved for free 10-day supply of fidaxomicin 200mg  tabs taken BID  Doreene Eland, PharmD, BCPS.   Work Cell: [phone] 07/24/2020 10:03 AM

## 2020-07-25 LAB — URINE CULTURE: Culture: >=100000 — AB

## 2020-08-01 DIAGNOSIS — Z8711 Personal history of peptic ulcer disease: Secondary | ICD-10-CM | POA: Insufficient documentation

## 2020-08-04 ENCOUNTER — Encounter: Payer: Self-pay | Admitting: Gastroenterology

## 2020-08-04 ENCOUNTER — Ambulatory Visit (INDEPENDENT_AMBULATORY_CARE_PROVIDER_SITE_OTHER): Payer: Medicare Other | Admitting: Gastroenterology

## 2020-08-04 ENCOUNTER — Other Ambulatory Visit: Payer: Self-pay

## 2020-08-04 VITALS — BP 116/82 | HR 66 | Temp 97.3°F | Ht 63.0 in | Wt 128.4 lb

## 2020-08-04 DIAGNOSIS — Z8711 Personal history of peptic ulcer disease: Secondary | ICD-10-CM

## 2020-08-04 DIAGNOSIS — Z8719 Personal history of other diseases of the digestive system: Secondary | ICD-10-CM

## 2020-08-04 NOTE — Progress Notes (Signed)
Regina Hernandez [address]  [place], [place] [address]  Main: [phone]  Fax: [phone]   Gastroenterology Consultation  Referring Provider:     Elby Beck, FNP Primary Care Physician:  Elby Beck, FNP Reason for Consultation:     Gastrointestinal hemorrhage        HPI:    Chief Complaint  Patient presents with  . Gastrointestinal hemorrhage    Regina Hernandez is a 86 y.o. y/o female referred for consultation & management  by Dr. Carlean Purl, Dalbert Batman, FNP.  The referral for this patient was placed on June 28, 2020 by PCP, due to hospital visit at that time for GI bleed and patient was found to have nonbleeding gastric ulcers and upper endoscopy done by Dr. Alice Reichert.  Since placement of the referral patient has been admitted to the hospital for C. difficile and recently completed Dificid treatment on July 30, 2020.  She took Protonix for about a month and then ran out of the medication with no refills as that is how it was ordered.  She was also taking sucralfate with it too.  Patient reported taking Aleve for 2 weeks prior to the diagnosis of her gastric ulcers.  Since then she has not been taking any NSAIDs and uses Tylenol as needed for headaches.  No prior history of GI bleed.    Past Medical History:  Diagnosis Date  . Anxiety   . Cervical dystonia   . Essential tremor   . GI bleed   . Memory loss   . Migraine   . Occipital neuralgia     Past Surgical History:  Procedure Laterality Date  . BOWEL RESECTION    . CARDIAC CATHETERIZATION  2016   no stents/[facility]  . ESOPHAGOGASTRODUODENOSCOPY N/A 06/18/2020   Procedure: ESOPHAGOGASTRODUODENOSCOPY (EGD);  Surgeon: [place], Benay Pike, MD;  Location: [facility] ENDOSCOPY;  Service: Gastroenterology;  Laterality: N/A;  . PACEMAKER IMPLANT    . REPLACEMENT TOTAL KNEE Left     Prior to Admission medications   Medication Sig Start Date End Date Taking? Authorizing Provider   acetaminophen (TYLENOL) 500 MG tablet Take 2 tablets (1,000 mg total) by mouth every 8 (eight) hours as needed for mild pain or moderate pain. 06/19/20  Yes Barb Merino, MD  Cholecalciferol (VITAMIN D3 PO) Take 25 mcg by mouth daily.   Yes [provider]  estradiol (ESTRACE) 0.1 MG/GM vaginal cream Place 1 Applicatorful vaginally as needed.   Yes [provider]  fluvoxaMINE (LUVOX) 50 MG tablet Take 50 mg by mouth 2 (two) times daily.   Yes [provider]  gabapentin (NEURONTIN) 400 MG capsule Take 1 capsule (400 mg total) by mouth 3 (three) times daily. 04/22/20  Yes Suzzanne Cloud, NP  memantine (NAMENDA) 10 MG tablet TAKE 1 TABLET BY MOUTH TWICE A DAY Patient taking differently: Take 10 mg by mouth 2 (two) times daily.  02/26/20  Yes Marcial Pacas, MD  midodrine (PROAMATINE) 2.5 MG tablet Take 1 tablet (2.5 mg total) by mouth 3 (three) times daily with meals. 07/14/20  Yes Agbor-Etang, Aaron Edelman, MD  primidone (MYSOLINE) 50 MG tablet Take 2 tablets (100 mg total) by mouth at bedtime. 04/22/20  Yes Suzzanne Cloud, NP  risperiDONE (RISPERDAL) 1 MG tablet Take 1 mg by mouth at bedtime. 07/17/20  Yes [provider]  topiramate (TOPAMAX) 50 MG tablet Take 50 mg by mouth 3 (three) times daily. 05/12/20  Yes [provider]  venlafaxine XR (EFFEXOR-XR) 75 MG 24 hr capsule Take 75 mg by mouth daily. 06/28/20  Yes [provider]  vitamin B-12 (CYANOCOBALAMIN) 1000 MCG tablet Take 1,000 mcg by mouth daily.   Yes [provider]    Family History  Problem Relation Age of Onset  . Other Mother        "old age"  . Ulcers Father   . Stomach cancer Brother      Social History   Tobacco Use  . Smoking status: Never Smoker  . Smokeless tobacco: Never Used  Vaping Use  . Vaping Use: Never used  Substance Use Topics  . Alcohol use: Not Currently  . Drug use: Never    Allergies as of 08/04/2020 - Review Complete 08/04/2020  Allergen Reaction  Noted  . Adhesive [tape] Other (See Comments) 02/04/2020  . Baclofen Rash 02/04/2020  . Lyrica [pregabalin] Other (See Comments) 02/04/2020  . Sulfa antibiotics Rash 02/04/2020    Review of Systems:    All systems reviewed and negative except where noted in HPI.   Physical Exam:  BP 116/82   Pulse 66   Temp (!) 97.3 F (36.3 C) (Oral)   Ht 5\' 3"  (1.6 m)   Wt 128 lb 6.4 oz (58.2 kg)   BMI 22.75 kg/m  No LMP recorded. Patient has had a hysterectomy. Psych:  Alert and cooperative. Normal mood and affect. General:   Alert,  Well-developed, well-nourished, pleasant and cooperative in NAD Head:  Normocephalic and atraumatic. Eyes:  Sclera clear, no icterus.   Conjunctiva pink. Ears:  Normal auditory acuity. Nose:  No deformity, discharge, or lesions. Mouth:  No deformity or lesions,oropharynx pink & moist. Neck:  Supple; no masses or thyromegaly. Abdomen:  Normal bowel sounds.  No bruits.  Soft, non-tender and non-distended without masses, hepatosplenomegaly or hernias noted.  No guarding or rebound tenderness.    Msk:  Symmetrical without gross deformities. Good, equal movement & strength bilaterally. Pulses:  Normal pulses noted. Extremities:  No clubbing or edema.  No cyanosis. Neurologic:  Alert and oriented x3;  grossly normal neurologically. Skin:  Intact without significant lesions or rashes. No jaundice. Lymph Nodes:  No significant cervical adenopathy. Psych:  Alert and cooperative. Normal mood and affect.   Labs: CBC    Component Value Date/Time   WBC 6.0 07/23/2020 0415   RBC 3.27 (L) 07/23/2020 0415   HGB 10.2 (L) 07/23/2020 0415   HGB 12.8 02/04/2020 1439   HCT 30.7 (L) 07/23/2020 0415   HCT 39.4 02/04/2020 1439   PLT 131 (L) 07/23/2020 0415   MCV 93.9 07/23/2020 0415   MCV 93 02/04/2020 1439   MCH 31.2 07/23/2020 0415   MCHC 33.2 07/23/2020 0415   RDW 14.8 07/23/2020 0415   RDW 11.7 02/04/2020 1439   LYMPHSABS 1.6 07/21/2020 1056   LYMPHSABS 2.8  02/04/2020 1439   MONOABS 0.8 07/21/2020 1056   EOSABS 0.0 07/21/2020 1056   EOSABS 0.3 02/04/2020 1439   BASOSABS 0.1 07/21/2020 1056   BASOSABS 0.1 02/04/2020 1439   CMP     Component Value Date/Time   NA 137 07/23/2020 0415   NA 144 02/04/2020 1439   K 3.2 (L) 07/23/2020 0415   CL 109 07/23/2020 0415   CO2 22 07/23/2020 0415   GLUCOSE 91 07/23/2020 0415   BUN 9 07/23/2020 0415   BUN 17 02/04/2020 1439   CREATININE 0.70 07/23/2020 0415   CALCIUM 8.7 (L) 07/23/2020 0415   PROT 6.8 07/21/2020 1056  PROT 6.8 02/04/2020 1439   ALBUMIN 3.8 07/21/2020 1056   ALBUMIN 4.3 02/04/2020 1439   AST 19 07/21/2020 1056   ALT 13 07/21/2020 1056   ALKPHOS 55 07/21/2020 1056   BILITOT 1.0 07/21/2020 1056   BILITOT 0.2 02/04/2020 1439   GFRNONAA >60 07/23/2020 0415   GFRAA >60 07/23/2020 0415    Imaging Studies: CT Head Wo Contrast  Result Date: 07/21/2020 CLINICAL DATA:  Altered mental status EXAM: CT HEAD WITHOUT CONTRAST TECHNIQUE: Contiguous axial images were obtained from the base of the skull through the vertex without intravenous contrast. COMPARISON:  02/15/2020 FINDINGS: Brain: No evidence of acute infarction, hemorrhage, hydrocephalus, or extra-axial hemorrhage. Stable CSF density structure within the left middle cranial fossa likely reflecting an arachnoid cyst. Mild scattered low-density changes within the periventricular and subcortical white matter compatible with chronic microvascular ischemic change. Mild diffuse cerebral volume loss. Vascular: Atherosclerotic calcifications involving the large vessels of the skull base. No unexpected hyperdense vessel. Skull: Normal. Negative for fracture or focal lesion. Sinuses/Orbits: No acute finding. Other: None. IMPRESSION: 1. No acute intracranial findings. 2. Chronic microvascular ischemic change and cerebral volume loss. 3. Stable left middle cranial fossa cyst, likely arachnoid cyst. Electronically Signed   By: Davina Poke D.O.    On: 07/21/2020 11:25   DG Chest Portable 1 View  Result Date: 07/21/2020 CLINICAL DATA:  Syncope EXAM: PORTABLE CHEST 1 VIEW COMPARISON:  July 01, 2020 FINDINGS: There is stable scarring in the right base. Lungs elsewhere are clear. Heart is upper normal in size with pulmonary vascularity normal. Pacemaker leads attached to right atrium and right ventricle. No evident adenopathy. No bone lesions evident. IMPRESSION: No edema or consolidation. Scarring right base. Stable cardiac silhouette. Pacemaker leads attached to right atrium and right ventricle. Electronically Signed   By: Lowella Grip III M.D.   On: 07/21/2020 12:16    Assessment and Plan:   Regina Hernandez is a 86 y.o. y/o female has been referred for gastrointestinal hemorrhage  Patient had an episode of GI bleed in June 2021 due to Aleve use and EGD showed 4 nonbleeding gastric ulcers  No further bleeding episodes since then  Patient completed 30 days of Protonix therapy  As patient is not having any further signs of active bleeding, and hemoglobin is improved, and has had recent C. difficile requiring hospitalization, will not refill Protonix due to possible side effects of C. difficile from the medication and since patient is otherwise asymptomatic  However, if patient begins having abdominal pain, black stool, or any other evidence of bleeding which the symptoms were discussed in detail with patient and family, patient to notify us immediately and she verbalized understanding  She was encouraged to hydrate well and if she is having antibiotics prescribed in the future for any reason, to inform her providers about her previous history of C. difficile so she can get narrow spectrum antibiotics if appropriate  H. pylori testing was recommended during her hospital admission for GI bleed but was never done.  Obtain at this time  Recheck CBC to ensure it is improving  Dr Regina Hernandez  Speech recognition software was used to  dictate the above note.

## 2020-08-05 LAB — CBC
Hematocrit: 41.4 % (ref 34.0–46.6)
Hemoglobin: 13.3 g/dL (ref 11.1–15.9)
MCH: 30.8 pg (ref 26.6–33.0)
MCHC: 32.1 g/dL (ref 31.5–35.7)
MCV: 96 fL (ref 79–97)
Platelets: 211 10*3/uL (ref 150–450)
RBC: 4.32 x10E6/uL (ref 3.77–5.28)
RDW: 13.1 % (ref 11.7–15.4)
WBC: 10.2 10*3/uL (ref 3.4–10.8)

## 2020-08-05 LAB — H. PYLORI BREATH TEST: H pylori Breath Test: NEGATIVE

## 2020-08-11 ENCOUNTER — Ambulatory Visit: Payer: Medicare Other | Admitting: Family Medicine

## 2020-08-21 ENCOUNTER — Other Ambulatory Visit: Payer: Self-pay

## 2020-08-21 ENCOUNTER — Encounter: Payer: Self-pay | Admitting: Cardiology

## 2020-08-21 ENCOUNTER — Ambulatory Visit (INDEPENDENT_AMBULATORY_CARE_PROVIDER_SITE_OTHER): Payer: Medicare Other | Admitting: Cardiology

## 2020-08-21 VITALS — BP 104/69 | HR 70 | Ht 63.0 in | Wt 129.5 lb

## 2020-08-21 DIAGNOSIS — Z950 Presence of cardiac pacemaker: Secondary | ICD-10-CM | POA: Diagnosis not present

## 2020-08-21 DIAGNOSIS — I951 Orthostatic hypotension: Secondary | ICD-10-CM

## 2020-08-21 MED ORDER — MIDODRINE HCL 5 MG PO TABS: 5.0000 mg | ORAL_TABLET | Freq: Three times a day (TID) | ORAL | 5 refills | Status: DC

## 2020-08-21 NOTE — Progress Notes (Signed)
Cardiology Office Note:    Date:  08/21/2020   ID:  Regina Hernandez, DOB [birth year], MRN [id number]  PCP:  Elby Beck, FNP  Cardiologist:  Kate Sable, MD  Electrophysiologist:  None   Referring MD: Elby Beck, FNP   Chief Complaint  Patient presents with  . office visit    1 month F/U; Meds verbally reviewed with patient.    History of Present Illness:    Regina Hernandez is a 86 y.o. female with a hx of anxiety, bradycardia, sick sinus syndrome status post permanent pacemaker (medtronic in 2013) who presents for follow-up.  Patient was last seen due to dizziness upon standing, diagnosed with orthostatic hypotension.  Midodrine 2.5 mg 3 times daily was started.  She states her symptoms have improved since starting midodrine.  She occasionally still gets dizzy when she stands up from a seated position.  Has not had any falls.  Tolerating midodrine okay with no adverse effects.  Has no questions at this time.   Prior notes She was previously seen at [facility] cardiology in [place].  Patient recently moved to the area from [place].  She denies any symptoms of chest pain or shortness of breath at rest or with exertion.  She is able to do all activities of daily living without chest pain or breathing problems.  She ambulates with a walker.  She states having a history of cardiac valve leakage.  Her pacemaker was placed roughly 7 years ago.  Left heart cath in 2012 at [place] showed minimal irregularities in the LAD and RCA.  Pacemaker lastly interrogated on 01/16/2020, report states pacemaker was functioning appropriately.Patient recently admitted to the hospital 06/2020 due to weakness, diarrhea and dizziness.  Diagnosed with sepsis likely from colitis.  Managed with IV fluids and antibiotics. Patient was previously admitted to the hospital in June 2021 due to bloody stools, diagnosed with peptic ulcer disease, received 2 units of packed red blood cells.  Aspirin  was stopped.  Past Medical History:  Diagnosis Date  . Anxiety   . Cervical dystonia   . Essential tremor   . GI bleed   . Memory loss   . Migraine   . Occipital neuralgia     Past Surgical History:  Procedure Laterality Date  . BOWEL RESECTION    . CARDIAC CATHETERIZATION  2016   no stents/[facility]  . ESOPHAGOGASTRODUODENOSCOPY N/A 06/18/2020   Procedure: ESOPHAGOGASTRODUODENOSCOPY (EGD);  Surgeon: Toledo, Benay Pike, MD;  Location: [facility] ENDOSCOPY;  Service: Gastroenterology;  Laterality: N/A;  . PACEMAKER IMPLANT    . REPLACEMENT TOTAL KNEE Left     Current Medications: Current Meds  Medication Sig  . acetaminophen (TYLENOL) 500 MG tablet Take 2 tablets (1,000 mg total) by mouth every 8 (eight) hours as needed for mild pain or moderate pain.  Marland Kitchen aspirin EC 81 MG tablet Take 81 mg by mouth daily. Swallow whole.  . Cholecalciferol (VITAMIN D3 PO) Take 25 mcg by mouth daily.  Marland Kitchen estradiol (ESTRACE) 0.1 MG/GM vaginal cream Place 1 Applicatorful vaginally as needed.  . fluvoxaMINE (LUVOX) 50 MG tablet Take 50 mg by mouth 2 (two) times daily.  Marland Kitchen gabapentin (NEURONTIN) 400 MG capsule Take 1 capsule (400 mg total) by mouth 3 (three) times daily.  . memantine (NAMENDA) 10 MG tablet Take 10 mg by mouth 2 (two) times daily.  . midodrine (PROAMATINE) 5 MG tablet Take 1 tablet (5 mg total) by mouth 3 (three) times daily with meals.  Marland Kitchen  primidone (MYSOLINE) 50 MG tablet Take 2 tablets (100 mg total) by mouth at bedtime.  . risperiDONE (RISPERDAL) 1 MG tablet Take 1 mg by mouth at bedtime.  . topiramate (TOPAMAX) 50 MG tablet Take 50 mg by mouth 3 (three) times daily.  Marland Kitchen venlafaxine XR (EFFEXOR-XR) 75 MG 24 hr capsule Take 75 mg by mouth daily.  . vitamin B-12 (CYANOCOBALAMIN) 1000 MCG tablet Take 1,000 mcg by mouth daily.  . [DISCONTINUED] midodrine (PROAMATINE) 2.5 MG tablet Take 1 tablet (2.5 mg total) by mouth 3 (three) times daily with meals.     Allergies:   Adhesive  [tape], Baclofen, Lyrica [pregabalin], and Sulfa antibiotics   Social History   Socioeconomic History  . Marital status: Widowed    Spouse name: Not on file  . Number of children: 5  . Years of education: 12th  . Highest education level: High school graduate  Occupational History  . Occupation: Retired  Tobacco Use  . Smoking status: Never Smoker  . Smokeless tobacco: Never Used  Vaping Use  . Vaping Use: Never used  Substance and Sexual Activity  . Alcohol use: Not Currently  . Drug use: Never  . Sexual activity: Not on file  Other Topics Concern  . Not on file  Social History Narrative   Lives at home with her daughters.   Right-handed.   Two cups caffeine per day.   Social Determinants of Health   Financial Resource Strain:   . Difficulty of Paying Living Expenses: Not on file  Food Insecurity:   . Worried About Charity fundraiser in the Last Year: Not on file  . Ran Out of Food in the Last Year: Not on file  Transportation Needs:   . Lack of Transportation (Medical): Not on file  . Lack of Transportation (Non-Medical): Not on file  Physical Activity:   . Days of Exercise per Week: Not on file  . Minutes of Exercise per Session: Not on file  Stress:   . Feeling of Stress : Not on file  Social Connections:   . Frequency of Communication with Friends and Family: Not on file  . Frequency of Social Gatherings with Friends and Family: Not on file  . Attends Religious Services: Not on file  . Active Member of Clubs or Organizations: Not on file  . Attends Archivist Meetings: Not on file  . Marital Status: Not on file     Family History: The patient's family history includes Other in her mother; Stomach cancer in her brother; Ulcers in her father.  ROS:   Please see the history of present illness.     All other systems reviewed and are negative.  EKGs/Labs/Other Studies Reviewed:    The following studies were reviewed today:   EKG:  EKG is   ordered today.  The ekg ordered today demonstrates normal sinus rhythm.  PACs.  Recent Labs: 06/22/2020: TSH 2.548 07/01/2020: B Natriuretic Peptide 332.3 07/21/2020: ALT 13 07/23/2020: BUN 9; Creatinine, Ser 0.70; Potassium 3.2; Sodium 137 07/24/2020: Magnesium 1.9 08/04/2020: Hemoglobin 13.3; Platelets 211  Recent Lipid Panel No results found for: CHOL, TRIG, HDL, CHOLHDL, VLDL, LDLCALC, LDLDIRECT  Physical Exam:    VS:  BP 104/69 (BP Location: Left Arm, Patient Position: Sitting, Cuff Size: Normal)   Pulse 70   Ht 5\' 3"  (1.6 m)   Wt 129 lb 8 oz (58.7 kg)   SpO2 97%   BMI 22.94 kg/m     Wt Readings from Last  3 Encounters:  08/21/20 129 lb 8 oz (58.7 kg)  08/04/20 128 lb 6.4 oz (58.2 kg)  07/22/20 128 lb (58.1 kg)     GEN:  Well nourished, well developed in no acute distress HEENT: Normal NECK: No JVD; No carotid bruits LYMPHATICS: No lymphadenopathy CARDIAC: RRR, no murmurs, rubs, gallops RESPIRATORY:  Clear to auscultation without rales, wheezing or rhonchi  ABDOMEN: Soft, non-tender, non-distended MUSCULOSKELETAL:  No edema; No deformity  SKIN: Warm and dry NEUROLOGIC:  Alert and oriented x 3 PSYCHIATRIC:  Normal affect   ASSESSMENT:    1. Orthostatic hypotension   2. Pacemaker    PLAN:    In order of problems listed above:  1. Patient with dizziness, orthostatic vitals in the office today still with orthostasis, patient also noted to be dizzy with standing from seated position.  Increase midodrine to 5 mg 3 times daily. fluid and salt liberalization advised. 2. Patient with history of bradycardia, sick sinus syndrome status post pacemaker.  Continue appointments with device clinic for frequent pacemaker checks.   . Follow-up in 2 months  This note was generated in part or whole with voice recognition software. Voice recognition is usually quite accurate but there are transcription errors that can and very often do occur. I apologize for any typographical errors that  were not detected and corrected.  Medication Adjustments/Labs and Tests Ordered: Current medicines are reviewed at length with the patient today.  Concerns regarding medicines are outlined above.  Orders Placed This Encounter  Procedures  . EKG 12-Lead   Meds ordered this encounter  Medications  . midodrine (PROAMATINE) 5 MG tablet    Sig: Take 1 tablet (5 mg total) by mouth 3 (three) times daily with meals.    Dispense:  90 tablet    Refill:  5    Patient Instructions  Medication Instructions:   Your physician has recommended you make the following change in your medication:   1. INCREASE your midodrine (PROAMATINE) 5 MG tablet: Take 1 tablet (5 mg total) by mouth 3 (three) times daily        with meals.  *If you need a refill on your cardiac medications before your next appointment, please call your pharmacy*   Lab Work: None Ordered If you have labs (blood work) drawn today and your tests are completely normal, you will receive your results only by: Marland Kitchen MyChart Message (if you have MyChart) OR . A paper copy in the mail If you have any lab test that is abnormal or we need to change your treatment, we will call you to review the results.   Testing/Procedures: None Ordered   Follow-Up: At [facility], you and your health needs are our priority.  As part of our continuing mission to provide you with exceptional heart care, we have created designated Provider Care Teams.  These Care Teams include your primary Cardiologist (physician) and Advanced Practice Providers (APPs -  Physician Assistants and Nurse Practitioners) who all work together to provide you with the care you need, when you need it.  We recommend signing up for the patient portal called "MyChart".  Sign up information is provided on this After Visit Summary.  MyChart is used to connect with patients for Virtual Visits (Telemedicine).  Patients are able to view lab/test results, encounter notes, upcoming  appointments, etc.  Non-urgent messages can be sent to your provider as well.   To learn more about what you can do with MyChart, go to [email or web address].  Your next appointment:   2 month(s)  The format for your next appointment:   In Person  Provider:   Kate Sable, MD   Other Instructions      Signed, Kate Sable, MD  08/21/2020 2:41 PM    [place]

## 2020-08-21 NOTE — Patient Instructions (Signed)
Medication Instructions:   Your physician has recommended you make the following change in your medication:   1. INCREASE your midodrine (PROAMATINE) 5 MG tablet: Take 1 tablet (5 mg total) by mouth 3 (three) times daily        with meals.  *If you need a refill on your cardiac medications before your next appointment, please call your pharmacy*   Lab Work: None Ordered If you have labs (blood work) drawn today and your tests are completely normal, you will receive your results only by: Marland Kitchen MyChart Message (if you have MyChart) OR . A paper copy in the mail If you have any lab test that is abnormal or we need to change your treatment, we will call you to review the results.   Testing/Procedures: None Ordered   Follow-Up: At [facility], you and your health needs are our priority.  As part of our continuing mission to provide you with exceptional heart care, we have created designated Provider Care Teams.  These Care Teams include your primary Cardiologist (physician) and Advanced Practice Providers (APPs -  Physician Assistants and Nurse Practitioners) who all work together to provide you with the care you need, when you need it.  We recommend signing up for the patient portal called "MyChart".  Sign up information is provided on this After Visit Summary.  MyChart is used to connect with patients for Virtual Visits (Telemedicine).  Patients are able to view lab/test results, encounter notes, upcoming appointments, etc.  Non-urgent messages can be sent to your provider as well.   To learn more about what you can do with MyChart, go to [email or web address].    Your next appointment:   2 month(s)  The format for your next appointment:   In Person  Provider:   Kate Sable, MD   Other Instructions

## 2020-08-25 ENCOUNTER — Other Ambulatory Visit: Payer: Self-pay

## 2020-08-25 ENCOUNTER — Ambulatory Visit
Admission: RE | Admit: 2020-08-25 | Discharge: 2020-08-25 | Disposition: A | Discharge status: Home / Self Care | Payer: Medicare Other | Source: Ambulatory Visit | Attending: Urology | Admitting: Urology

## 2020-08-25 DIAGNOSIS — R3129 Other microscopic hematuria: Secondary | ICD-10-CM | POA: Insufficient documentation

## 2020-09-10 ENCOUNTER — Encounter: Payer: Self-pay | Admitting: Family Medicine

## 2020-09-10 ENCOUNTER — Other Ambulatory Visit: Payer: Self-pay

## 2020-09-10 ENCOUNTER — Ambulatory Visit (INDEPENDENT_AMBULATORY_CARE_PROVIDER_SITE_OTHER): Payer: Medicare Other | Admitting: Family Medicine

## 2020-09-10 VITALS — BP 118/82 | HR 88 | Temp 98.1°F | Ht 63.0 in | Wt 130.0 lb

## 2020-09-10 DIAGNOSIS — F32A Depression, unspecified: Secondary | ICD-10-CM

## 2020-09-10 DIAGNOSIS — F329 Major depressive disorder, single episode, unspecified: Secondary | ICD-10-CM

## 2020-09-10 DIAGNOSIS — R197 Diarrhea, unspecified: Secondary | ICD-10-CM | POA: Diagnosis not present

## 2020-09-10 DIAGNOSIS — Z23 Encounter for immunization: Secondary | ICD-10-CM

## 2020-09-10 DIAGNOSIS — R269 Unspecified abnormalities of gait and mobility: Secondary | ICD-10-CM

## 2020-09-10 NOTE — Progress Notes (Signed)
   Subjective:    Patient ID: Regina Hernandez, female    DOB: Mar 19, 1934, 86 y.o.   MRN: [id number]  HPI Chief Complaint  Patient presents with  . Follow-up    colitis:pt states feeling doing good since last visit.   This is an 86 yo female who presents today for follow up. Accompanied by her daughter.   C dif colitis- No additional diarrhea/ loose stools, requiring occasional stool softener.   Gait abnormality- No falls.Using walker for ambulation at all times.   Depression- mood has been good- has upcoming visit with psychiatry, had to be postponed due to hospitalization.      Review of Systems Denies headache, visual change, chest pain, shortness of breath, abdominal pain, dysuria, urinary frequency, hematuria, leg swelling    Objective:   Physical Exam Physical Exam  Constitutional: Oriented to person, place, and time. Appears well-developed and well-nourished.  HENT:  Head: Normocephalic and atraumatic.  Eyes: Conjunctivae are normal.  Neck: Normal range of motion. Neck supple.  Cardiovascular: Normal rate, regular rhythm and normal heart sounds.   Pulmonary/Chest: Effort normal and breath sounds normal.  Musculoskeletal: No lower extremity edema.   Neurological: Alert and oriented to person, place, and time.  Skin: Skin is warm and dry.  Psychiatric: Normal mood and affect. Behavior is normal. Judgment and thought content normal.  Vitals reviewed.     BP 118/82   Pulse 88   Temp 98.1 F (36.7 C) (Temporal)   Ht 5\' 3"  (1.6 m)   Wt 130 lb (59 kg)   SpO2 97%   BMI 23.03 kg/m  Wt Readings from Last 3 Encounters:  09/10/20 130 lb (59 kg)  08/21/20 129 lb 8 oz (58.7 kg)  08/04/20 128 lb 6.4 oz (58.2 kg)       Assessment & Plan:  1. Diarrhea, unspecified type -This has resolved.  Discussed continued use of as needed stool softeners, can add MiraLAX if needed  2. Gait abnormality -Encouraged her to continue use of walker with all ambulation  3. Depression,  unspecified depression type -Upcoming appointment with psychiatry.  Most recent PHQ-9 with a score of 1.  4. Need for influenza vaccination - Flu Vaccine QUAD High Dose(Fluad)  -Follow-up in 6 months  This visit occurred during the SARS-CoV-2 public health emergency.  Safety protocols were in place, including screening questions prior to the visit, additional usage of staff PPE, and extensive cleaning of exam room while observing appropriate contact time as indicated for disinfecting solutions.    Clarene Reamer, FNP-BC  [facility], [facility]  09/10/2020 5:06 PM

## 2020-09-10 NOTE — Patient Instructions (Signed)
Good to see you today  Please follow up in 6 months

## 2020-09-12 ENCOUNTER — Other Ambulatory Visit: Payer: Self-pay

## 2020-09-12 ENCOUNTER — Ambulatory Visit (INDEPENDENT_AMBULATORY_CARE_PROVIDER_SITE_OTHER): Payer: Medicare Other | Admitting: Urology

## 2020-09-12 ENCOUNTER — Encounter: Payer: Self-pay | Admitting: Urology

## 2020-09-12 VITALS — BP 111/88 | HR 79 | Ht 64.0 in | Wt 130.0 lb

## 2020-09-12 DIAGNOSIS — R3129 Other microscopic hematuria: Secondary | ICD-10-CM | POA: Diagnosis not present

## 2020-09-12 DIAGNOSIS — R8271 Bacteriuria: Secondary | ICD-10-CM

## 2020-09-12 NOTE — Progress Notes (Signed)
09/12/2020 12:03 PM   Regina Hernandez 10/19/1934 [id number]  Referring provider: Elby Beck, [address] [place],  [place] [address]  Chief Complaint  Patient presents with   Results    HPI:  F/u - UTI/asymptomatic bacteriuria - sometime she "has no symptoms". She was tx with abx and developed c diff. Abx and vancomycin are complete. Urine culture was negative 07/04/2020.  A June 2021 culture grew E. coli.  A December 2020 culture grew E. Coli. She's been in and out of hospital.   She voids with a good stream. PVR was normal. She drinks a lot water. She has urgency and mild UUI. NG risk includes memory loss. She has a hysterectomy. Bowels regular.   She previously saw Dr. Marily Memos in [place]. CT: 01/2017 - normal GU tract; Cystoscopy: 09/19/17 - normal GU tract  UA with many bacteria 07/21 and cx grew enterobacter. After a lengthy discussion we decided not to treat the cx given she was not clinically infected and was dealing with c diff. She did have 3-10 rbc per hpf. Cr 0.79 07/21. She developed weakness and was admitted to hospital. I communicated with hospitalist and they focused on IVF and tx of the c diff continuing to hold abx for the urine cx. She improved.   [place] of kidney and bladder 08/26/2020 was benign. No hydro, stone or mass. Today, she is doing "much better". She is drinking more water. Less frequency. Urine clear.    PMH: Past Medical History:  Diagnosis Date   Anxiety    Cervical dystonia    Essential tremor    GI bleed    Memory loss    Migraine    Occipital neuralgia     Surgical History: Past Surgical History:  Procedure Laterality Date   BOWEL RESECTION     CARDIAC CATHETERIZATION  2016   no stents/[facility]   ESOPHAGOGASTRODUODENOSCOPY N/A 06/18/2020   Procedure: ESOPHAGOGASTRODUODENOSCOPY (EGD);  Surgeon: Toledo, Benay Pike, MD;  Location: [facility] ENDOSCOPY;  Service: Gastroenterology;  Laterality: N/A;    PACEMAKER IMPLANT     REPLACEMENT TOTAL KNEE Left     Home Medications:  Allergies as of 09/12/2020      Reactions   Adhesive [tape] Other (See Comments)   Contact dermatitis   Baclofen Rash   Lyrica [pregabalin] Other (See Comments)   Unable to remember reaction.   Sulfa Antibiotics Rash      Medication List       Accurate as of September 12, 2020 12:03 PM. If you have any questions, ask your nurse or doctor.        acetaminophen 500 MG tablet Commonly known as: TYLENOL Take 2 tablets (1,000 mg total) by mouth every 8 (eight) hours as needed for mild pain or moderate pain.   aspirin EC 81 MG tablet Take 81 mg by mouth daily. Swallow whole.   estradiol 0.1 MG/GM vaginal cream Commonly known as: ESTRACE Place 1 Applicatorful vaginally as needed.   fluvoxaMINE 50 MG tablet Commonly known as: LUVOX Take 50 mg by mouth 2 (two) times daily.   gabapentin 400 MG capsule Commonly known as: NEURONTIN Take 1 capsule (400 mg total) by mouth 3 (three) times daily.   memantine 10 MG tablet Commonly known as: NAMENDA Take 10 mg by mouth 2 (two) times daily.   memantine 10 MG tablet Commonly known as: NAMENDA TAKE 1 TABLET BY MOUTH TWICE A DAY   midodrine 5 MG tablet Commonly known as:  PROAMATINE Take 1 tablet (5 mg total) by mouth 3 (three) times daily with meals.   primidone 50 MG tablet Commonly known as: MYSOLINE Take 2 tablets (100 mg total) by mouth at bedtime.   risperiDONE 1 MG tablet Commonly known as: RISPERDAL Take 1 mg by mouth at bedtime.   topiramate 50 MG tablet Commonly known as: TOPAMAX Take 50 mg by mouth 3 (three) times daily.   venlafaxine XR 75 MG 24 hr capsule Commonly known as: EFFEXOR-XR Take 75 mg by mouth daily.   vitamin B-12 1000 MCG tablet Commonly known as: CYANOCOBALAMIN Take 1,000 mcg by mouth daily.   VITAMIN D3 PO Take 25 mcg by mouth daily.       Allergies:  Allergies  Allergen Reactions   Adhesive [Tape] Other  (See Comments)    Contact dermatitis   Baclofen Rash   Lyrica [Pregabalin] Other (See Comments)    Unable to remember reaction.   Sulfa Antibiotics Rash    Family History: Family History  Problem Relation Age of Onset   Other Mother        "old age"   Ulcers Father    Stomach cancer Brother     Social History:  reports that she has never smoked. She has never used smokeless tobacco. She reports previous alcohol use. She reports that she does not use drugs.   Physical Exam: BP 111/88 (BP Location: Left Arm, Patient Position: Sitting, Cuff Size: Normal)    Pulse 79    Ht 5\' 4"  (1.626 m)    Wt 130 lb (59 kg)    BMI 22.31 kg/m   Constitutional:  Alert and oriented, No acute distress. HEENT: [place] AT, moist mucus membranes.  Trachea midline, no masses. Cardiovascular: No clubbing, cyanosis, or edema. Respiratory: Normal respiratory effort, no increased work of breathing. GI: Abdomen is soft, nontender, nondistended, no abdominal masses GU: No CVA tenderness Lymph: No cervical or inguinal lymphadenopathy. Skin: No rashes, bruises or suspicious lesions. Neurologic: Grossly intact, no focal deficits, moving all 4 extremities. Psychiatric: Normal mood and affect.  Laboratory Data: Lab Results  Component Value Date   WBC 10.2 08/04/2020   HGB 13.3 08/04/2020   HCT 41.4 08/04/2020   MCV 96 08/04/2020   PLT 211 08/04/2020    Lab Results  Component Value Date   CREATININE 0.70 07/23/2020    No results found for: PSA  No results found for: TESTOSTERONE  No results found for: HGBA1C  Urinalysis    Component Value Date/Time   COLORURINE YELLOW (A) 07/21/2020 1130   APPEARANCEUR HAZY (A) 07/21/2020 1130   APPEARANCEUR Cloudy (A) 07/18/2020 1257   LABSPEC 1.017 07/21/2020 1130   PHURINE 5.0 07/21/2020 1130   GLUCOSEU NEGATIVE 07/21/2020 1130   HGBUR MODERATE (A) 07/21/2020 1130   BILIRUBINUR NEGATIVE 07/21/2020 1130   BILIRUBINUR Negative 07/18/2020 1257    KETONESUR NEGATIVE 07/21/2020 1130   PROTEINUR NEGATIVE 07/21/2020 1130   NITRITE POSITIVE (A) 07/21/2020 1130   LEUKOCYTESUR SMALL (A) 07/21/2020 1130    Lab Results  Component Value Date   LABMICR See below: 07/18/2020   WBCUA >30 (A) 07/18/2020   LABEPIT 0-10 07/18/2020   BACTERIA MANY (A) 07/21/2020    Pertinent Imaging: Renal US  No results found for this or any previous visit.  No results found for this or any previous visit.  No results found for this or any previous visit.  No results found for this or any previous visit.  Results for orders placed during  the hospital encounter of 08/25/20  US RENAL  Narrative CLINICAL DATA:  Microscopic hematuria  EXAM: RENAL / URINARY TRACT ULTRASOUND COMPLETE  COMPARISON:  None.  FINDINGS: Right Kidney:  Renal measurements: 9.9 x 4.4 x 3.4 cm = volume: 76 mL. Cortical thinning. Normal echotexture. No mass or hydronephrosis. No visible stones.  Left Kidney:  Renal measurements: 10.1 x 5.0 x 4.4 cm = volume: 115 mL. 10 mm cyst in the midpole. Normal echotexture. No hydronephrosis. No visible stones.  Bladder:  Appears normal for degree of bladder distention.  Other:  None.  IMPRESSION: No acute findings.  No hydronephrosis.   Electronically Signed By: Rolm Baptise M.D. On: 08/26/2020 11:36  No results found for this or any previous visit.  No results found for this or any previous visit.  No results found for this or any previous visit.   Assessment & Plan:    Recurrent UTI/bacteriuria - complicated history with multiple records from [facility] and [place] from Dr. Currie Paris reviewed.   MH - no gross hematuria and prior benign eval, so will follow.   F/u 6 mo with a UA   No follow-ups on file.  Festus Aloe, MD  [facility] [address] [place], [place] [address] [phone]

## 2020-09-23 ENCOUNTER — Ambulatory Visit (INDEPENDENT_AMBULATORY_CARE_PROVIDER_SITE_OTHER): Payer: Medicare Other | Admitting: Physician Assistant

## 2020-09-23 ENCOUNTER — Encounter: Payer: Self-pay | Admitting: Physician Assistant

## 2020-09-23 ENCOUNTER — Other Ambulatory Visit: Payer: Self-pay

## 2020-09-23 VITALS — BP 106/75 | HR 97 | Ht 64.0 in | Wt 133.7 lb

## 2020-09-23 DIAGNOSIS — R350 Frequency of micturition: Secondary | ICD-10-CM | POA: Diagnosis not present

## 2020-09-23 MED ORDER — MIRABEGRON ER 50 MG PO TB24: 50.0000 mg | ORAL_TABLET | Freq: Every day | ORAL | 0 refills | Status: DC

## 2020-09-23 NOTE — Progress Notes (Signed)
In and Out Catheterization  Patient is present today for a I & O catheterization due to recurrent UTI. Patient was cleaned and prepped in a sterile fashion with betadine . A 14FR cath was inserted no complications were noted , 48ml of urine return was noted, urine was yellow in color. A clean urine sample was collected for urinalysis. Bladder was drained  And catheter was removed with out difficulty.    Performed by: Bradly Bienenstock, CMA

## 2020-09-23 NOTE — Progress Notes (Signed)
09/23/2020 4:22 PM   Regina Hernandez [birth year] [id number]  CC: Chief Complaint  Patient presents with  . Recurrent UTI    HPI: Regina Hernandez is a 86 y.o. female with PMH urinary urgency, asymptomatic bacteriuria, recurrent UTI with recent episodes of C. difficile secondary to antibiotic use, and microscopic hematuria managed by Dr. Junious Silk who presents today for evaluation of possible UTI.  She was seen in clinic most recently 11 days ago for routine follow-up and was asymptomatic at that time.  She is accompanied today by her daughter, who contributes to HPI.  Today she reports a stable, months long history of urinary urgency, frequency, and urge incontinence.  She wears pads daily for management of her leakage.  She denies dysuria or discomfort with urination.  She denies stress incontinence.  Additionally, she reports occasional hot flashes and night sweats that are not associated with the urge to urinate.  She reports drinking 3 to 4 glasses of water daily as well as 2 cups of coffee in the morning to stimulate her bowels.  No alcohol intake.  She has a history of memory loss on Namenda as well as constipation.  In-office catheterized UA today positive for 2+ blood and nitrites; urine microscopy with 6-10 WBCs/HPF and many bacteria. Catheterized volume approximately 13mL.  PMH: Past Medical History:  Diagnosis Date  . Anxiety   . Cervical dystonia   . Essential tremor   . GI bleed   . Memory loss   . Migraine   . Occipital neuralgia     Surgical History: Past Surgical History:  Procedure Laterality Date  . BOWEL RESECTION    . CARDIAC CATHETERIZATION  2016   no stents/[facility]  . ESOPHAGOGASTRODUODENOSCOPY N/A 06/18/2020   Procedure: ESOPHAGOGASTRODUODENOSCOPY (EGD);  Surgeon: Toledo, Benay Pike, MD;  Location: [facility] ENDOSCOPY;  Service: Gastroenterology;  Laterality: N/A;  . PACEMAKER IMPLANT    . REPLACEMENT TOTAL KNEE Left     Home Medications:    Allergies as of 09/23/2020      Reactions   Adhesive [tape] Other (See Comments)   Contact dermatitis   Baclofen Rash   Lyrica [pregabalin] Other (See Comments)   Unable to remember reaction.   Sulfa Antibiotics Rash      Medication List       Accurate as of September 23, 2020  4:22 PM. If you have any questions, ask your nurse or doctor.        acetaminophen 500 MG tablet Commonly known as: TYLENOL Take 2 tablets (1,000 mg total) by mouth every 8 (eight) hours as needed for mild pain or moderate pain.   aspirin EC 81 MG tablet Take 81 mg by mouth daily. Swallow whole.   estradiol 0.1 MG/GM vaginal cream Commonly known as: ESTRACE Place 1 Applicatorful vaginally as needed.   fluvoxaMINE 50 MG tablet Commonly known as: LUVOX Take 50 mg by mouth 2 (two) times daily.   gabapentin 400 MG capsule Commonly known as: NEURONTIN Take 1 capsule (400 mg total) by mouth 3 (three) times daily.   memantine 10 MG tablet Commonly known as: NAMENDA Take 10 mg by mouth 2 (two) times daily.   memantine 10 MG tablet Commonly known as: NAMENDA TAKE 1 TABLET BY MOUTH TWICE A DAY   midodrine 5 MG tablet Commonly known as: PROAMATINE Take 1 tablet (5 mg total) by mouth 3 (three) times daily with meals.   mirabegron ER 50 MG Tb24 tablet Commonly known as: MYRBETRIQ Take 1 tablet (50  mg total) by mouth daily. Started by: Debroah Loop, PA-C   primidone 50 MG tablet Commonly known as: MYSOLINE Take 2 tablets (100 mg total) by mouth at bedtime.   risperiDONE 1 MG tablet Commonly known as: RISPERDAL Take 1 mg by mouth at bedtime.   topiramate 50 MG tablet Commonly known as: TOPAMAX Take 50 mg by mouth 3 (three) times daily.   venlafaxine XR 75 MG 24 hr capsule Commonly known as: EFFEXOR-XR Take 75 mg by mouth daily.   vitamin B-12 1000 MCG tablet Commonly known as: CYANOCOBALAMIN Take 1,000 mcg by mouth daily.   VITAMIN D3 PO Take 25 mcg by mouth daily.        Allergies:  Allergies  Allergen Reactions  . Adhesive [Tape] Other (See Comments)    Contact dermatitis  . Baclofen Rash  . Lyrica [Pregabalin] Other (See Comments)    Unable to remember reaction.  . Sulfa Antibiotics Rash    Family History: Family History  Problem Relation Age of Onset  . Other Mother        "old age"  . Ulcers Father   . Stomach cancer Brother     Social History:   reports that she has never smoked. She has never used smokeless tobacco. She reports previous alcohol use. She reports that she does not use drugs.  Physical Exam: BP 106/75 (BP Location: Left Arm, Patient Position: Sitting, Cuff Size: Normal)   Pulse 97   Ht 5\' 4"  (1.626 m)   Wt 133 lb 11.2 oz (60.6 kg)   BMI 22.95 kg/m   Constitutional:  Alert and oriented, no acute distress, nontoxic appearing HEENT: [place], AT Cardiovascular: No clubbing, cyanosis, or edema Respiratory: Normal respiratory effort, no increased work of breathing Skin: No rashes, bruises or suspicious lesions Neurologic: Grossly intact, no focal deficits, moving all 4 extremities Psychiatric: Normal mood and affect  Laboratory Data: Results for orders placed or performed in visit on 09/23/20  Microscopic Examination   Urine  Result Value Ref Range   WBC, UA 6-10 (A) 0 - 5 /hpf   RBC 0-2 0 - 2 /hpf   Epithelial Cells (non renal) 0-10 0 - 10 /hpf   Casts Present (A) None seen /lpf   Cast Type Hyaline casts N/A   Bacteria, UA Many (A) None seen/Few  Urinalysis, Complete  Result Value Ref Range   Specific Gravity, UA 1.020 1.005 - 1.030   pH, UA 5.0 5.0 - 7.5   Color, UA Yellow Yellow   Appearance Ur Cloudy (A) Clear   Leukocytes,UA Negative Negative   Protein,UA Negative Negative/Trace   Glucose, UA Negative Negative   Ketones, UA Negative Negative   RBC, UA 2+ (A) Negative   Bilirubin, UA Negative Negative   Urobilinogen, Ur 0.2 0.2 - 1.0 mg/dL   Nitrite, UA Positive (A) Negative   Microscopic Examination  See below:    Assessment & Plan:   1. Urinary frequency Chronic, stable, and consistent with acute cystitis.  I suspect she has OAB and concurrent asymptomatic bacteriuria.  I do lengthy conversation with the patient and her daughter today which I explained that in the absence of infective symptoms including dysuria, worsened urgency/frequency, lower abdominal pain, low back pain, nausea, vomiting, fever, or chills, there is no indication to treat the bacteria in her urine.  This is especially the case given her recent history of multiple rounds of C. difficile secondary to antibiotic use.  I offered the patient a trial of Myrbetriq today  for management of her urgency, frequency, and urge incontinence.  We will plan for symptom recheck with PVR with Dr. Junious Silk in 1 month.  Notably, anticholinergics are contraindicated in this patient given her age and history of constipation and memory loss. - Urinalysis, Complete - mirabegron ER (MYRBETRIQ) 50 MG TB24 tablet; Take 1 tablet (50 mg total) by mouth daily.  Dispense: 28 tablet; Refill: 0   Return in about 4 weeks (around 10/21/2020) for Symptom recheck with PVR with Dr. Junious Silk.  Debroah Loop, PA-C  [facility] [address], [place], [place] [address] [phone]

## 2020-09-24 LAB — URINALYSIS, COMPLETE
Bilirubin, UA: NEGATIVE
Glucose, UA: NEGATIVE
Ketones, UA: NEGATIVE
Leukocytes,UA: NEGATIVE
Nitrite, UA: POSITIVE — AB
Protein,UA: NEGATIVE
Specific Gravity, UA: 1.02 (ref 1.005–1.030)
Urobilinogen, Ur: 0.2 mg/dL (ref 0.2–1.0)
pH, UA: 5 (ref 5.0–7.5)

## 2020-09-24 LAB — MICROSCOPIC EXAMINATION

## 2020-10-02 ENCOUNTER — Ambulatory Visit (INDEPENDENT_AMBULATORY_CARE_PROVIDER_SITE_OTHER): Payer: Medicare Other

## 2020-10-02 DIAGNOSIS — I495 Sick sinus syndrome: Secondary | ICD-10-CM | POA: Diagnosis not present

## 2020-10-02 LAB — CUP PACEART REMOTE DEVICE CHECK
Battery Impedance: 1352 Ohm
Battery Remaining Longevity: 60 mo
Battery Voltage: 2.76 V
Brady Statistic AP VP Percent: 0 %
Brady Statistic AP VS Percent: 46 %
Brady Statistic AS VP Percent: 0 %
Brady Statistic AS VS Percent: 53 %
Date Time Interrogation Session: 20211007070739
Implantable Lead Implant Date: 20130122
Implantable Lead Implant Date: 20130122
Implantable Lead Location: 753859
Implantable Lead Location: 753860
Implantable Lead Model: 4092
Implantable Lead Model: 5076
Implantable Pulse Generator Implant Date: 20130122
Lead Channel Impedance Value: 396 Ohm
Lead Channel Impedance Value: 680 Ohm
Lead Channel Pacing Threshold Amplitude: 0.5 V
Lead Channel Pacing Threshold Amplitude: 0.625 V
Lead Channel Pacing Threshold Pulse Width: 0.4 ms
Lead Channel Pacing Threshold Pulse Width: 0.4 ms
Lead Channel Setting Pacing Amplitude: 1.5 V
Lead Channel Setting Pacing Amplitude: 2 V
Lead Channel Setting Pacing Pulse Width: 0.4 ms
Lead Channel Setting Sensing Sensitivity: 4 mV

## 2020-10-06 NOTE — Progress Notes (Signed)
Remote pacemaker transmission.   

## 2020-10-17 ENCOUNTER — Other Ambulatory Visit: Payer: Self-pay

## 2020-10-17 MED ORDER — MIDODRINE HCL 5 MG PO TABS: 5.0000 mg | ORAL_TABLET | Freq: Three times a day (TID) | ORAL | 0 refills | Status: DC

## 2020-10-17 NOTE — Telephone Encounter (Signed)
*  STAT* If patient is at the pharmacy, call can be transferred to refill team.   1. Which medications need to be refilled? (please list name of each medication and dose if known) Midodrine  2. Which pharmacy/location (including street and city if local pharmacy) is medication to be sent to? [facility]  3. Do they need a 30 day or 90 day supply? [place]

## 2020-10-22 ENCOUNTER — Ambulatory Visit (INDEPENDENT_AMBULATORY_CARE_PROVIDER_SITE_OTHER): Payer: Medicare Other | Admitting: Neurology

## 2020-10-22 ENCOUNTER — Encounter: Payer: Self-pay | Admitting: Neurology

## 2020-10-22 ENCOUNTER — Other Ambulatory Visit: Payer: Self-pay | Admitting: Neurology

## 2020-10-22 VITALS — BP 124/70 | HR 72 | Ht 64.0 in | Wt 139.0 lb

## 2020-10-22 DIAGNOSIS — R519 Headache, unspecified: Secondary | ICD-10-CM | POA: Diagnosis not present

## 2020-10-22 DIAGNOSIS — R269 Unspecified abnormalities of gait and mobility: Secondary | ICD-10-CM

## 2020-10-22 DIAGNOSIS — G250 Essential tremor: Secondary | ICD-10-CM | POA: Diagnosis not present

## 2020-10-22 DIAGNOSIS — G8929 Other chronic pain: Secondary | ICD-10-CM

## 2020-10-22 DIAGNOSIS — R413 Other amnesia: Secondary | ICD-10-CM | POA: Diagnosis not present

## 2020-10-22 MED ORDER — GABAPENTIN 100 MG PO CAPS: 200.0000 mg | ORAL_CAPSULE | Freq: Two times a day (BID) | ORAL | 1 refills | Status: DC

## 2020-10-22 MED ORDER — PRIMIDONE 50 MG PO TABS: 100.0000 mg | ORAL_TABLET | Freq: Every day | ORAL | 1 refills | Status: DC

## 2020-10-22 NOTE — Patient Instructions (Signed)
Decrease gabapentin 200 mg twice daily Continue other medications I am glad you are doing well  See you back in 6 months

## 2020-10-22 NOTE — Progress Notes (Signed)
PATIENT: Regina Hernandez DOB: [birth year]  REASON FOR VISIT: follow up HISTORY FROM: patient  HISTORY OF PRESENT ILLNESS: Today 10/22/20  HISTORY  Regina Hernandez a 86 year old female, seen in request byher primary care physicianClark, Hernandez,accompanied by her daughter Regina Hernandez for evaluation of constellation of complaints, try to establish neurology care here on February 04, 2020.  I have reviewed and summarized the referring note from the referring physician.She had past medical history of pacemakerplacement.  I was able to review extensive previous records from [facility] neurology by Doctor Regina Hernandez, Regina Hernandez, most recent visit was on June 13, 2018, she was seen for occipital neuralgia, migraine, memory loss, cervical dystonia, also tremor, likely to be essential tremor, had occipital nerve block in the past, which was not helpful, started on primidone 50 mg every night, increase to 2 tablets every night, was offered Vicodin 5 325 mg 6 tablets for occipital neuralgia  Patient had longstanding history of depression, lost her husband in 2019, in December 2020, she overdosed herself on 1 bottle of Ambien, was admitted at [facility],  She now lives with her 2 daughters, the most bothersome symptoms for her is memory loss, mildly unsteady gait.  She retired from daycare job, used to enjoying working the yard, was noted to have gradual onset of memory loss since 2019, getting worse since December 2020, she forgets dates, has difficulty operating TV remote control, microwave, and other household appliances, she hopes to receive treatment for her migraine,  She is on polypharmacy treatment, including primidone 50 mg 3 tablets every night for tremor, Topamax 50 mg 3 times a day for migraine headaches, gabapentin 400 mg 2 tablets 3 times a day for chronic pain, and also Risperdal 2 mg at bedtime, Effexor 75 mg daily, fluvoxamine 50 mg twice a day,  She has a long history of migraine  headaches, overall has been improved,  Update April 22, 2020 SS: Here today for follow-up accompanied by her daughter.  Since last seen, she was able to taper off Topamax.  Lower her dose of gabapentin and primidone.  She is on gabapentin for occipital neuralgia and neuropathy, was able to decrease from 800 mg 3 times a day, down to 400 mg twice a day. She is having more pain, wonder about a little higher dose, feet and legs numb in the evening, doesn't sleep with pillow because occipital neuralgia.  Decrease primidone from 150 mg at bedtime, to 100 mg at bedtime.  She has noticed slight increase in her tremors with handwriting.  Overall, with medication adjustment, her mind is clearer, she is functioning better, gait is more stable.  She saw her psychiatrist recently, Risperdal was decreased from 2 to 1 mg.  She is overall doing well.  CT head showed no acute abnormality, evidence of mild generalized atrophy, supratentorium small vessel disease, left anterior temporal fossa arachnoid cyst, which is chronic and benign  Laboratory evaluation TSH, RPR, B12, CBC, CMP were unremarkable  EEG CONCLUSION: This isan abnormal EEG.There is electrodiagnostic evidence of generalized background slowing, consistent with mild bihemispheric malfunction, common etiology metabolic toxic, and central nervous system degenerative disorder.   Update October 22, 2020 SS: Here today with her daughter, was hospitalized in the summer with syncope felt to be related to C. difficile, has been placed on Midodrine.  Lives with her daughters, does not drive, no falls.  Memory is improved, on Namenda 10 mg twice a day, tolerating well.  Does her own daily activities, no real hobbies, enjoys  watching TV.  Has cut back gabapentin 400 mg twice a day, denies significant head pain.  Weaned off Topamax.  Sees urology, placed on Myrbetriq.  Follows with psychiatry, on Luvox, Risperdal 1 mg at bedtime, Effexor 75 mg daily.  Tremor stable  on primidone, notices with handwriting, eating, not significantly bothersome.  MMSE 25/30 today.  REVIEW OF SYSTEMS: Out of a complete 14 system review of symptoms, the patient complains only of the following symptoms, and all other reviewed systems are negative.  Tremor, memory loss  ALLERGIES: Allergies  Allergen Reactions  . Adhesive [Tape] Other (See Comments)    Contact dermatitis  . Baclofen Rash  . Lyrica [Pregabalin] Other (See Comments)    Unable to remember reaction.  . Sulfa Antibiotics Rash    HOME MEDICATIONS: Outpatient Medications Prior to Visit  Medication Sig Dispense Refill  . acetaminophen (TYLENOL) 500 MG tablet Take 2 tablets (1,000 mg total) by mouth every 8 (eight) hours as needed for mild pain or moderate pain. 30 tablet 0  . aspirin EC 81 MG tablet Take 81 mg by mouth daily. Swallow whole.    . Cholecalciferol (VITAMIN D3 PO) Take 25 mcg by mouth daily.    Marland Kitchen estradiol (ESTRACE) 0.1 MG/GM vaginal cream Place 1 Applicatorful vaginally as needed.    . fluvoxaMINE (LUVOX) 50 MG tablet Take 50 mg by mouth 2 (two) times daily.    Marland Kitchen gabapentin (NEURONTIN) 400 MG capsule Take 1 capsule (400 mg total) by mouth 3 (three) times daily. 270 capsule 1  . memantine (NAMENDA) 10 MG tablet Take 10 mg by mouth 2 (two) times daily.    . midodrine (PROAMATINE) 5 MG tablet Take 1 tablet (5 mg total) by mouth 3 (three) times daily with meals. 90 tablet 0  . mirabegron ER (MYRBETRIQ) 50 MG TB24 tablet Take 1 tablet (50 mg total) by mouth daily. 28 tablet 0  . primidone (MYSOLINE) 50 MG tablet Take 2 tablets (100 mg total) by mouth at bedtime. 180 tablet 1  . risperiDONE (RISPERDAL) 1 MG tablet Take 1 mg by mouth at bedtime.    . topiramate (TOPAMAX) 50 MG tablet Take 50 mg by mouth 3 (three) times daily.    Marland Kitchen venlafaxine XR (EFFEXOR-XR) 75 MG 24 hr capsule Take 75 mg by mouth daily.    . vitamin B-12 (CYANOCOBALAMIN) 1000 MCG tablet Take 1,000 mcg by mouth daily.    . memantine  (NAMENDA) 10 MG tablet TAKE 1 TABLET BY MOUTH TWICE A DAY (Patient taking differently: Take 10 mg by mouth 2 (two) times daily. ) 180 tablet 4   No facility-administered medications prior to visit.    PAST MEDICAL HISTORY: Past Medical History:  Diagnosis Date  . Anxiety   . Cervical dystonia   . Essential tremor   . GI bleed   . Memory loss   . Migraine   . Occipital neuralgia     PAST SURGICAL HISTORY: Past Surgical History:  Procedure Laterality Date  . BOWEL RESECTION    . CARDIAC CATHETERIZATION  2016   no stents/[facility]  . ESOPHAGOGASTRODUODENOSCOPY N/A 06/18/2020   Procedure: ESOPHAGOGASTRODUODENOSCOPY (EGD);  Surgeon: Toledo, Benay Pike, MD;  Location: [facility] ENDOSCOPY;  Service: Gastroenterology;  Laterality: N/A;  . PACEMAKER IMPLANT    . REPLACEMENT TOTAL KNEE Left     FAMILY HISTORY: Family History  Problem Relation Age of Onset  . Other Mother        "old age"  . Ulcers Father   .  Stomach cancer Brother     SOCIAL HISTORY: Social History   Socioeconomic History  . Marital status: Widowed    Spouse name: Not on file  . Number of children: 5  . Years of education: 12th  . Highest education level: High school graduate  Occupational History  . Occupation: Retired  Tobacco Use  . Smoking status: Never Smoker  . Smokeless tobacco: Never Used  Vaping Use  . Vaping Use: Never used  Substance and Sexual Activity  . Alcohol use: Not Currently  . Drug use: Never  . Sexual activity: Not on file  Other Topics Concern  . Not on file  Social History Narrative   Lives at home with her daughters.   Right-handed.   Two cups caffeine per day.   Social Determinants of Health   Financial Resource Strain:   . Difficulty of Paying Living Expenses: Not on file  Food Insecurity:   . Worried About Charity fundraiser in the Last Year: Not on file  . Ran Out of Food in the Last Year: Not on file  Transportation Needs:   . Lack of  Transportation (Medical): Not on file  . Lack of Transportation (Non-Medical): Not on file  Physical Activity:   . Days of Exercise per Week: Not on file  . Minutes of Exercise per Session: Not on file  Stress:   . Feeling of Stress : Not on file  Social Connections:   . Frequency of Communication with Friends and Family: Not on file  . Frequency of Social Gatherings with Friends and Family: Not on file  . Attends Religious Services: Not on file  . Active Member of Clubs or Organizations: Not on file  . Attends Archivist Meetings: Not on file  . Marital Status: Not on file  Intimate Partner Violence:   . Fear of Current or Ex-Partner: Not on file  . Emotionally Abused: Not on file  . Physically Abused: Not on file  . Sexually Abused: Not on file   PHYSICAL EXAM  Vitals:   10/22/20 1002  BP: 124/70  Pulse: 72  Weight: 139 lb (63 kg)  Height: 5\' 4"  (1.626 m)   Body mass index is 23.86 kg/m.  Generalized: Well developed, in no acute distress  MMSE - Mini Mental State Exam 10/22/2020 02/04/2020  Orientation to time 5 4  Orientation to Place 5 4  Registration 3 3  Attention/ Calculation 0 5  Recall 3 2  Language- name 2 objects 2 2  Language- repeat 1 1  Language- follow 3 step command 3 3  Language- read & follow direction 1 1  Write a sentence 1 1  Copy design 1 1  Total score 25 27    Neurological examination  Mentation: Alert oriented to time, place, history taking. Follows all commands speech and language fluent Cranial nerve II-XII: Pupils were equal round reactive to light. Extraocular movements were full, visual field were full on confrontational test. Facial sensation and strength were normal. Head turning and shoulder shrug were normal and symmetric. Motor: Good strength all extremities Sensory: Sensory testing is intact to soft touch on all 4 extremities. No evidence of extinction is noted.  Coordination: Cerebellar testing reveals good  finger-nose-finger and heel-to-shin bilaterally.  Mild intention tremor with finger-nose-finger bilaterally. Gait and station: Gait is slightly wide-based, but steady, uses single-point cane Reflexes: Deep tendon reflexes are symmetric but decreased throughout  DIAGNOSTIC DATA (LABS, IMAGING, TESTING) - I reviewed patient records, labs,  notes, testing and imaging myself where available.  Lab Results  Component Value Date   WBC 10.2 08/04/2020   HGB 13.3 08/04/2020   HCT 41.4 08/04/2020   MCV 96 08/04/2020   PLT 211 08/04/2020      Component Value Date/Time   NA 137 07/23/2020 0415   NA 144 02/04/2020 1439   K 3.2 (L) 07/23/2020 0415   CL 109 07/23/2020 0415   CO2 22 07/23/2020 0415   GLUCOSE 91 07/23/2020 0415   BUN 9 07/23/2020 0415   BUN 17 02/04/2020 1439   CREATININE 0.70 07/23/2020 0415   CALCIUM 8.7 (L) 07/23/2020 0415   PROT 6.8 07/21/2020 1056   PROT 6.8 02/04/2020 1439   ALBUMIN 3.8 07/21/2020 1056   ALBUMIN 4.3 02/04/2020 1439   AST 19 07/21/2020 1056   ALT 13 07/21/2020 1056   ALKPHOS 55 07/21/2020 1056   BILITOT 1.0 07/21/2020 1056   BILITOT 0.2 02/04/2020 1439   GFRNONAA >60 07/23/2020 0415   GFRAA >60 07/23/2020 0415   No results found for: CHOL, HDL, LDLCALC, LDLDIRECT, TRIG, CHOLHDL No results found for: HGBA1C Lab Results  Component Value Date   VITAMINB12 475 07/01/2020   Lab Results  Component Value Date   TSH 2.548 06/22/2020   ASSESSMENT AND PLAN 86 y.o. year old female  has a past medical history of Anxiety, Cervical dystonia, Essential tremor, GI bleed, Memory loss, Migraine, and Occipital neuralgia. here with:  1.  Mild cognitive impairment -MMSE was 25/30 today -Not a candidate for MRI due to pacemaker -Continue Namenda 10 mg twice a day -Laboratory evaluation TSH, RPR, B12, CBC, CMP were unremarkable -CT scan showed no acute abnormality, evidence of mild generalized atrophy, supratentorium small vessel disease, left anterior temporal  fossa arachnoid cyst, which is chronic and benign finding -EEG: This isan abnormal EEG.There is electrodiagnostic evidence of generalized background slowing, consistent with mild bihemispheric malfunction, common etiology metabolic toxic, and central nervous system degenerative disorder.  2.  Gait abnormality -Likely due to combination of aging, deconditioning, polypharmacy treatment, right knee replacement -No recent falls  3.  Essential tremor -Continue low-dose primidone 100 mg at bedtime  4.  Depression, anxiety, polypharmacy treatment -Seeing psychiatry, on Luvox, Risperdal 1 mg at bedtime, Effexor XR 75 mg daily, under good control  5.  Occipital neuralgia/neuropathy -Well-controlled, decrease gabapentin 200 mg twice a day (at 1 point was taking 800 mg 3 times a day, we have slowly weaned dose) -Follow-up in 6 months or sooner if needed  I spent 30 minutes of face-to-face and non-face-to-face time with patient.  This included previsit chart review, lab review, study review, order entry, electronic health record documentation, patient education.  Butler Denmark, AGNP-C, DNP 10/22/2020, 10:22 AM [facility] [address] [place], [place] [address] [phone]

## 2020-10-24 ENCOUNTER — Ambulatory Visit (INDEPENDENT_AMBULATORY_CARE_PROVIDER_SITE_OTHER): Payer: Medicare Other | Admitting: Urology

## 2020-10-24 ENCOUNTER — Other Ambulatory Visit: Payer: Self-pay

## 2020-10-24 ENCOUNTER — Encounter: Payer: Self-pay | Admitting: Urology

## 2020-10-24 VITALS — BP 119/84 | HR 86 | Ht 64.0 in | Wt 139.0 lb

## 2020-10-24 DIAGNOSIS — N3941 Urge incontinence: Secondary | ICD-10-CM | POA: Diagnosis not present

## 2020-10-24 DIAGNOSIS — R350 Frequency of micturition: Secondary | ICD-10-CM

## 2020-10-24 LAB — BLADDER SCAN AMB NON-IMAGING

## 2020-10-24 MED ORDER — MIRABEGRON ER 25 MG PO TB24: 25.0000 mg | ORAL_TABLET | Freq: Every day | ORAL | 11 refills | Status: DC

## 2020-10-24 NOTE — Patient Instructions (Signed)
Mirabegron extended-release tablets What is this medicine? MIRABEGRON (MIR a BEG ron) is used to treat overactive bladder. This medicine reduces the amount of bathroom visits. It may also help to control wetting accidents. It may be used alone, but sometimes may be given with other treatments. This medicine may be used for other purposes; ask your health care provider or pharmacist if you have questions. COMMON BRAND NAME(S): Myrbetriq What should I tell my health care provider before I take this medicine? They need to know if you have any of these conditions:  high blood pressure  kidney disease  liver disease  problems urinating  prostate disease  an unusual or allergic reaction to mirabegron, other medicines, foods, dyes, or preservatives  pregnant or trying to get pregnant  breast-feeding How should I use this medicine? Take this medicine by mouth with a glass of water. Follow the directions on the prescription label. Do not cut, crush or chew this medicine. You can take it with or without food. If it upsets your stomach, take it with food. Take your medicine at regular intervals. Do not take it more often than directed. Do not stop taking except on your doctor's advice. Talk to your pediatrician regarding the use of this medicine in children. Special care may be needed. Overdosage: If you think you have taken too much of this medicine contact a poison control center or emergency room at once. NOTE: This medicine is only for you. Do not share this medicine with others. What if I miss a dose? If you miss a dose, take it as soon as you can. If it is almost time for your next dose, take only that dose. Do not take double or extra doses. What may interact with this medicine?  codeine  desipramine  digoxin  flecainide  MAOIs like Carbex, Eldepryl, Marplan, Nardil, and Parnate  methadone  metoprolol  pimozide  propafenone  thioridazine  warfarin This list may not  describe all possible interactions. Give your health care provider a list of all the medicines, herbs, non-prescription drugs, or dietary supplements you use. Also tell them if you smoke, drink alcohol, or use illegal drugs. Some items may interact with your medicine. What should I watch for while using this medicine? Visit your doctor or health care professional for regular checks on your progress. Check your blood pressure as directed. Ask your doctor or health care professional what your blood pressure should be and when you should contact him or her. You may need to limit your intake of tea, coffee, caffeinated sodas, or alcohol. These drinks may make your symptoms worse. What side effects may I notice from receiving this medicine? Side effects that you should report to your doctor or health care professional as soon as possible:  allergic reactions like skin rash, itching or hives, swelling of the face, lips, or tongue  high blood pressure  fast, irregular heartbeat  redness, blistering, peeling or loosening of the skin, including inside the mouth  signs of infection like fever or chills; pain or difficulty passing urine  trouble passing urine or change in the amount of urine Side effects that usually do not require medical attention (report to your doctor or health care professional if they continue or are bothersome):  constipation  dry mouth  headache  runny nose  stomach upset This list may not describe all possible side effects. Call your doctor for medical advice about side effects. You may report side effects to FDA at 1-800-FDA-1088. Where should   I keep my medicine? Keep out of the reach of children. Store at room temperature between 15 and 30 degrees C (59 and 86 degrees F). Throw away any unused medicine after the expiration date. NOTE: This sheet is a summary. It may not cover all possible information. If you have questions about this medicine, talk to your doctor,  pharmacist, or health care provider.  2020 Elsevier/Gold Standard (2017-05-05 11:33:21)  

## 2020-10-24 NOTE — Progress Notes (Signed)
10/24/2020 9:49 AM   Regina Hernandez [birth year] [id number]  Referring provider: Elby Beck, [place],  [place] [address]  Chief Complaint  Patient presents with   Follow-up    HPI:  F/u -    1) frequency, urgency, UUI - She voids with a good stream. PVR was normal. She drinks a lot water. She has urgency and mild UUI. NG risk includes memory loss. She has a hysterectomy. Bowels regular. She was started on Myrbetriq 25 mg Sep 2021. PVR today 29 ml.   2) UTI/asymptomatic bacteriuria - sometime she "has no symptoms". She was tx with abx and developed c diff. Abx and vancomycin are complete.Urine culture was negative 07/04/2020. A June 2021 culture grew E. coli. A December 2020 culture grew E. Coli. She's been in and out of hospital. She developed weakness and was admitted to hospital July 2021. She was treated without abx despite a + urine cx because she had no dysuria and was dealing with C. Diff. She improved.   3) MH - She previously saw Dr. Marily Memos in [place]. CT: 01/2017 - normal GU tract; Cystoscopy: 09/19/17 - normal GU tract. [place] of kidney and bladder 08/26/2020 was benign. No hydro, stone or mass.   She returns in management of the above. She did very well on Myrbetriq. Her urgency and UUI are much improved. She wasn't even wearing a pad.    PMH: Past Medical History:  Diagnosis Date   Anxiety    Cervical dystonia    Essential tremor    GI bleed    Memory loss    Migraine    Occipital neuralgia     Surgical History: Past Surgical History:  Procedure Laterality Date   BOWEL RESECTION     CARDIAC CATHETERIZATION  2016   no stents/[facility]   ESOPHAGOGASTRODUODENOSCOPY N/A 06/18/2020   Procedure: ESOPHAGOGASTRODUODENOSCOPY (EGD);  Surgeon: Toledo, Benay Pike, MD;  Location: [facility] ENDOSCOPY;  Service: Gastroenterology;  Laterality: N/A;   PACEMAKER IMPLANT     REPLACEMENT TOTAL KNEE Left     Home  Medications:  Allergies as of 10/24/2020      Reactions   Adhesive [tape] Other (See Comments)   Contact dermatitis   Baclofen Rash   Lyrica [pregabalin] Other (See Comments)   Unable to remember reaction.   Sulfa Antibiotics Rash      Medication List       Accurate as of October 24, 2020  9:49 AM. If you have any questions, ask your nurse or doctor.        acetaminophen 500 MG tablet Commonly known as: TYLENOL Take 2 tablets (1,000 mg total) by mouth every 8 (eight) hours as needed for mild pain or moderate pain.   aspirin EC 81 MG tablet Take 81 mg by mouth daily. Swallow whole.   estradiol 0.1 MG/GM vaginal cream Commonly known as: ESTRACE Place 1 Applicatorful vaginally as needed.   fluvoxaMINE 50 MG tablet Commonly known as: LUVOX Take 50 mg by mouth 2 (two) times daily.   gabapentin 100 MG capsule Commonly known as: NEURONTIN Take 2 capsules (200 mg total) by mouth 2 (two) times daily.   memantine 10 MG tablet Commonly known as: NAMENDA Take 10 mg by mouth 2 (two) times daily.   midodrine 5 MG tablet Commonly known as: PROAMATINE Take 1 tablet (5 mg total) by mouth 3 (three) times daily with meals.   mirabegron ER 50 MG Tb24 tablet Commonly known as: MYRBETRIQ  Take 1 tablet (50 mg total) by mouth daily.   primidone 50 MG tablet Commonly known as: MYSOLINE Take 2 tablets (100 mg total) by mouth at bedtime.   risperiDONE 1 MG tablet Commonly known as: RISPERDAL Take 1 mg by mouth at bedtime.   venlafaxine XR 75 MG 24 hr capsule Commonly known as: EFFEXOR-XR Take 75 mg by mouth daily.   vitamin B-12 1000 MCG tablet Commonly known as: CYANOCOBALAMIN Take 1,000 mcg by mouth daily.   VITAMIN D3 PO Take 25 mcg by mouth daily.       Allergies:  Allergies  Allergen Reactions   Adhesive [Tape] Other (See Comments)    Contact dermatitis   Baclofen Rash   Lyrica [Pregabalin] Other (See Comments)    Unable to remember reaction.   Sulfa  Antibiotics Rash    Family History: Family History  Problem Relation Age of Onset   Other Mother        "old age"   Ulcers Father    Stomach cancer Brother     Social History:  reports that she has never smoked. She has never used smokeless tobacco. She reports previous alcohol use. She reports that she does not use drugs.   Physical Exam: BP 119/84 (BP Location: Left Arm, Patient Position: Sitting, Cuff Size: Normal)    Pulse 86    Ht 5\' 4"  (1.626 m)    Wt 139 lb (63 kg)    BMI 23.86 kg/m   Constitutional:  Alert and oriented, No acute distress. HEENT: [place] AT, moist mucus membranes.  Trachea midline, no masses. Cardiovascular: No clubbing, cyanosis, or edema. Respiratory: Normal respiratory effort, no increased work of breathing. GI: Abdomen is soft, nontender, nondistended, no abdominal masses GU: No CVA tenderness Skin: No rashes, bruises or suspicious lesions. Neurologic: Grossly intact, no focal deficits, moving all 4 extremities. Psychiatric: Normal mood and affect.  Laboratory Data: Lab Results  Component Value Date   WBC 10.2 08/04/2020   HGB 13.3 08/04/2020   HCT 41.4 08/04/2020   MCV 96 08/04/2020   PLT 211 08/04/2020    Lab Results  Component Value Date   CREATININE 0.70 07/23/2020    No results found for: PSA  No results found for: TESTOSTERONE  No results found for: HGBA1C  Urinalysis    Component Value Date/Time   COLORURINE YELLOW (A) 07/21/2020 1130   APPEARANCEUR Cloudy (A) 09/23/2020 1021   LABSPEC 1.017 07/21/2020 1130   PHURINE 5.0 07/21/2020 1130   GLUCOSEU Negative 09/23/2020 1021   HGBUR MODERATE (A) 07/21/2020 1130   BILIRUBINUR Negative 09/23/2020 1021   KETONESUR NEGATIVE 07/21/2020 1130   PROTEINUR Negative 09/23/2020 1021   PROTEINUR NEGATIVE 07/21/2020 1130   NITRITE Positive (A) 09/23/2020 1021   NITRITE POSITIVE (A) 07/21/2020 1130   LEUKOCYTESUR Negative 09/23/2020 1021   LEUKOCYTESUR SMALL (A) 07/21/2020 1130     Lab Results  Component Value Date   LABMICR See below: 09/23/2020   WBCUA 6-10 (A) 09/23/2020   LABEPIT 0-10 09/23/2020   BACTERIA Many (A) 09/23/2020    Pertinent Imaging: n/a No results found for this or any previous visit.  No results found for this or any previous visit.  No results found for this or any previous visit.  No results found for this or any previous visit.  Results for orders placed during the hospital encounter of 08/25/20  US RENAL  Narrative CLINICAL DATA:  Microscopic hematuria  EXAM: RENAL / URINARY TRACT ULTRASOUND COMPLETE  COMPARISON:  None.  FINDINGS: Right Kidney:  Renal measurements: 9.9 x 4.4 x 3.4 cm = volume: 76 mL. Cortical thinning. Normal echotexture. No mass or hydronephrosis. No visible stones.  Left Kidney:  Renal measurements: 10.1 x 5.0 x 4.4 cm = volume: 115 mL. 10 mm cyst in the midpole. Normal echotexture. No hydronephrosis. No visible stones.  Bladder:  Appears normal for degree of bladder distention.  Other:  None.  IMPRESSION: No acute findings.  No hydronephrosis.   Electronically Signed By: Rolm Baptise M.D. On: 08/26/2020 11:36  No results found for this or any previous visit.  No results found for this or any previous visit.  No results found for this or any previous visit.   Assessment & Plan:    1. Urinary frequency, urgency and UUI - much improved on Myrbetriq. Rx sent and we will see her back in 6 months.  - Bladder Scan (Post Void Residual) in office   No follow-ups on file.  Festus Aloe, MD  [facility] [address], [place], [place] [address] [phone]

## 2020-10-31 ENCOUNTER — Ambulatory Visit (INDEPENDENT_AMBULATORY_CARE_PROVIDER_SITE_OTHER): Payer: Medicare Other | Admitting: Cardiology

## 2020-10-31 ENCOUNTER — Encounter: Payer: Self-pay | Admitting: Cardiology

## 2020-10-31 ENCOUNTER — Other Ambulatory Visit: Payer: Self-pay

## 2020-10-31 VITALS — BP 142/98 | HR 94 | Ht 64.0 in | Wt 138.0 lb

## 2020-10-31 DIAGNOSIS — I495 Sick sinus syndrome: Secondary | ICD-10-CM

## 2020-10-31 DIAGNOSIS — I951 Orthostatic hypotension: Secondary | ICD-10-CM

## 2020-10-31 NOTE — Progress Notes (Signed)
Cardiology Office Note:    Date:  10/31/2020   ID:  Regina Hernandez, DOB Dec 07, 1934, MRN [id number]  PCP:  Elby Beck, FNP  Cardiologist:  Kate Sable, MD  Electrophysiologist:  None   Referring MD: Elby Beck, FNP   Chief Complaint  Patient presents with  . Follow-up    2 Months follow up. Medications verbally reviewed with patient and daughter    History of Present Illness:    Regina Hernandez is a 86 y.o. female with a hx of anxiety, bradycardia, sick sinus syndrome s/p permanent pacemaker (medtronic in 2013) who presents for follow-up.  Patient being seen due to orthostatic hypotension.  Midodrine was started and titrated to 5 mg 3 times daily after last visit.  Tolerating midodrine without any adverse effects.  Denies any falls.  Denies any further episodes of dizziness, presyncope with ambulation or standing.  She uses a walker.  Prior notes She was previously seen at [facility] cardiology in [place].  Patient recently moved to the area from [place].  She denies any symptoms of chest pain or shortness of breath at rest or with exertion.  She is able to do all activities of daily living without chest pain or breathing problems.  She ambulates with a walker.  She states having a history of cardiac valve leakage.  Her pacemaker was placed roughly 7 years ago.  Left heart cath in 2012 at [facility] showed minimal irregularities in the LAD and RCA.  Pacemaker lastly interrogated on 01/16/2020, report states pacemaker was functioning appropriately.Patient recently admitted to the hospital 06/2020 due to weakness, diarrhea and dizziness.  Diagnosed with sepsis likely from colitis.  Managed with IV fluids and antibiotics. Patient was  admitted to the hospital in June 2021 due to bloody stools, diagnosed with peptic ulcer disease, received 2 units of packed red blood cells.  Aspirin was stopped.    Echocardiogram 05/2020 showed normal systolic function, impaired  relaxation, EF 65%.  Past Medical History:  Diagnosis Date  . Anxiety   . Cervical dystonia   . Essential tremor   . GI bleed   . Memory loss   . Migraine   . Occipital neuralgia     Past Surgical History:  Procedure Laterality Date  . BOWEL RESECTION    . CARDIAC CATHETERIZATION  2016   no stents/[facility]  . ESOPHAGOGASTRODUODENOSCOPY N/A 06/18/2020   Procedure: ESOPHAGOGASTRODUODENOSCOPY (EGD);  Surgeon: Toledo, Benay Pike, MD;  Location: [facility] ENDOSCOPY;  Service: Gastroenterology;  Laterality: N/A;  . PACEMAKER IMPLANT    . REPLACEMENT TOTAL KNEE Left     Current Medications: Current Meds  Medication Sig  . acetaminophen (TYLENOL) 500 MG tablet Take 2 tablets (1,000 mg total) by mouth every 8 (eight) hours as needed for mild pain or moderate pain.  Marland Kitchen aspirin EC 81 MG tablet Take 81 mg by mouth daily. Swallow whole.  . Cholecalciferol (VITAMIN D3 PO) Take 25 mcg by mouth daily.  Marland Kitchen estradiol (ESTRACE) 0.1 MG/GM vaginal cream Place 1 Applicatorful vaginally as needed.  . fluvoxaMINE (LUVOX) 50 MG tablet Take 50 mg by mouth 2 (two) times daily.  Marland Kitchen gabapentin (NEURONTIN) 100 MG capsule Take 2 capsules (200 mg total) by mouth 2 (two) times daily.  . memantine (NAMENDA) 10 MG tablet Take 10 mg by mouth 2 (two) times daily.  . midodrine (PROAMATINE) 5 MG tablet Take 1 tablet (5 mg total) by mouth 3 (three) times daily with meals.  . mirabegron ER (MYRBETRIQ) 25  MG TB24 tablet Take 1 tablet (25 mg total) by mouth daily.  . primidone (MYSOLINE) 50 MG tablet Take 2 tablets (100 mg total) by mouth at bedtime.  . risperiDONE (RISPERDAL) 1 MG tablet Take 1 mg by mouth at bedtime.  Marland Kitchen venlafaxine XR (EFFEXOR-XR) 75 MG 24 hr capsule Take 75 mg by mouth daily.  . vitamin B-12 (CYANOCOBALAMIN) 1000 MCG tablet Take 1,000 mcg by mouth daily.     Allergies:   Adhesive [tape], Baclofen, Lyrica [pregabalin], and Sulfa antibiotics   Social History   Socioeconomic History  .  Marital status: Widowed    Spouse name: Not on file  . Number of children: 5  . Years of education: 12th  . Highest education level: High school graduate  Occupational History  . Occupation: Retired  Tobacco Use  . Smoking status: Never Smoker  . Smokeless tobacco: Never Used  Vaping Use  . Vaping Use: Never used  Substance and Sexual Activity  . Alcohol use: Not Currently  . Drug use: Never  . Sexual activity: Not on file  Other Topics Concern  . Not on file  Social History Narrative   Lives at home with her daughters.   Right-handed.   Two cups caffeine per day.   Social Determinants of Health   Financial Resource Strain:   . Difficulty of Paying Living Expenses: Not on file  Food Insecurity:   . Worried About Charity fundraiser in the Last Year: Not on file  . Ran Out of Food in the Last Year: Not on file  Transportation Needs:   . Lack of Transportation (Medical): Not on file  . Lack of Transportation (Non-Medical): Not on file  Physical Activity:   . Days of Exercise per Week: Not on file  . Minutes of Exercise per Session: Not on file  Stress:   . Feeling of Stress : Not on file  Social Connections:   . Frequency of Communication with Friends and Family: Not on file  . Frequency of Social Gatherings with Friends and Family: Not on file  . Attends Religious Services: Not on file  . Active Member of Clubs or Organizations: Not on file  . Attends Archivist Meetings: Not on file  . Marital Status: Not on file     Family History: The patient's family history includes Other in her mother; Stomach cancer in her brother; Ulcers in her father.  ROS:   Please see the history of present illness.     All other systems reviewed and are negative.  EKGs/Labs/Other Studies Reviewed:    The following studies were reviewed today:   EKG:  EKG not  ordered today.   Recent Labs: 06/22/2020: TSH 2.548 07/01/2020: B Natriuretic Peptide 332.3 07/21/2020: ALT  13 07/23/2020: BUN 9; Creatinine, Ser 0.70; Potassium 3.2; Sodium 137 07/24/2020: Magnesium 1.9 08/04/2020: Hemoglobin 13.3; Platelets 211  Recent Lipid Panel No results found for: CHOL, TRIG, HDL, CHOLHDL, VLDL, LDLCALC, LDLDIRECT  Physical Exam:    VS:  BP (!) 142/98 (BP Location: Left Arm, Patient Position: Sitting, Cuff Size: Normal)   Pulse 94   Ht 5\' 4"  (1.626 m)   Wt 138 lb (62.6 kg)   SpO2 96%   BMI 23.69 kg/m     Wt Readings from Last 3 Encounters:  10/31/20 138 lb (62.6 kg)  10/24/20 139 lb (63 kg)  10/22/20 139 lb (63 kg)     GEN:  Well nourished, well developed in no acute distress HEENT:  Normal NECK: No JVD; No carotid bruits LYMPHATICS: No lymphadenopathy CARDIAC: RRR, no murmurs, rubs, gallops RESPIRATORY:  Clear to auscultation without rales, wheezing or rhonchi  ABDOMEN: Soft, non-tender, non-distended MUSCULOSKELETAL:  No edema; No deformity  SKIN: Warm and dry NEUROLOGIC:  Alert and oriented x 3 PSYCHIATRIC:  Normal affect   ASSESSMENT:    1. Orthostatic hypotension   2. Sick sinus syndrome (HCC)    PLAN:    In order of problems listed above:  1. Patient history of dizziness, orthostatic hypotension.  Symptoms resolved on midodrine.  Continue midodrine 5 mg 3 times daily. 2. Patient with history of bradycardia, sick sinus syndrome s/p pacemaker.  Continue appointments with device clinic for frequent pacemaker checks.   . Follow-up in 6 months  This note was generated in part or whole with voice recognition software. Voice recognition is usually quite accurate but there are transcription errors that can and very often do occur. I apologize for any typographical errors that were not detected and corrected.  Medication Adjustments/Labs and Tests Ordered: Current medicines are reviewed at length with the patient today.  Concerns regarding medicines are outlined above.  No orders of the defined types were placed in this encounter.  No orders of the  defined types were placed in this encounter.   Patient Instructions  Medication Instructions:  Your physician recommends that you continue on your current medications as directed. Please refer to the Current Medication list given to you today.  *If you need a refill on your cardiac medications before your next appointment, please call your pharmacy*   Lab Work: None Ordered If you have labs (blood work) drawn today and your tests are completely normal, you will receive your results only by: Marland Kitchen MyChart Message (if you have MyChart) OR . A paper copy in the mail If you have any lab test that is abnormal or we need to change your treatment, we will call you to review the results.   Testing/Procedures: None Ordered   Follow-Up: At [facility], you and your health needs are our priority.  As part of our continuing mission to provide you with exceptional heart care, we have created designated Provider Care Teams.  These Care Teams include your primary Cardiologist (physician) and Advanced Practice Providers (APPs -  Physician Assistants and Nurse Practitioners) who all work together to provide you with the care you need, when you need it.  We recommend signing up for the patient portal called "MyChart".  Sign up information is provided on this After Visit Summary.  MyChart is used to connect with patients for Virtual Visits (Telemedicine).  Patients are able to view lab/test results, encounter notes, upcoming appointments, etc.  Non-urgent messages can be sent to your provider as well.   To learn more about what you can do with MyChart, go to [email or web address].    Your next appointment:   6 month(s)  The format for your next appointment:   In Person  Provider:   Kate Sable, MD   Other Instructions      Signed, Kate Sable, MD  10/31/2020 12:57 PM    [place]

## 2020-10-31 NOTE — Patient Instructions (Signed)
Medication Instructions:  Your physician recommends that you continue on your current medications as directed. Please refer to the Current Medication list given to you today.  *If you need a refill on your cardiac medications before your next appointment, please call your pharmacy*   Lab Work: None Ordered If you have labs (blood work) drawn today and your tests are completely normal, you will receive your results only by: Marland Kitchen MyChart Message (if you have MyChart) OR . A paper copy in the mail If you have any lab test that is abnormal or we need to change your treatment, we will call you to review the results.   Testing/Procedures: None Ordered   Follow-Up: At [facility], you and your health needs are our priority.  As part of our continuing mission to provide you with exceptional heart care, we have created designated Provider Care Teams.  These Care Teams include your primary Cardiologist (physician) and Advanced Practice Providers (APPs -  Physician Assistants and Nurse Practitioners) who all work together to provide you with the care you need, when you need it.  We recommend signing up for the patient portal called "MyChart".  Sign up information is provided on this After Visit Summary.  MyChart is used to connect with patients for Virtual Visits (Telemedicine).  Patients are able to view lab/test results, encounter notes, upcoming appointments, etc.  Non-urgent messages can be sent to your provider as well.   To learn more about what you can do with MyChart, go to [email or web address].    Your next appointment:   6 month(s)  The format for your next appointment:   In Person  Provider:   Kate Sable, MD   Other Instructions

## 2020-11-10 ENCOUNTER — Other Ambulatory Visit: Payer: Self-pay

## 2020-11-10 MED ORDER — MIDODRINE HCL 5 MG PO TABS
5.0000 mg | ORAL_TABLET | Freq: Three times a day (TID) | ORAL | 5 refills | Status: DC
Start: 2020-11-10 — End: 2021-04-30

## 2020-11-17 NOTE — Progress Notes (Signed)
I have reviewed and agreed above plan. 

## 2021-01-01 ENCOUNTER — Ambulatory Visit (INDEPENDENT_AMBULATORY_CARE_PROVIDER_SITE_OTHER): Payer: Medicare Other

## 2021-01-01 DIAGNOSIS — I495 Sick sinus syndrome: Secondary | ICD-10-CM

## 2021-01-01 LAB — CUP PACEART REMOTE DEVICE CHECK
Battery Impedance: 1407 Ohm
Battery Remaining Longevity: 59 mo
Battery Voltage: 2.76 V
Brady Statistic AP VP Percent: 0 %
Brady Statistic AP VS Percent: 39 %
Brady Statistic AS VP Percent: 0 %
Brady Statistic AS VS Percent: 60 %
Date Time Interrogation Session: 20220106065607
Implantable Lead Implant Date: 20130122
Implantable Lead Implant Date: 20130122
Implantable Lead Location: 753859
Implantable Lead Location: 753860
Implantable Lead Model: 4092
Implantable Lead Model: 5076
Implantable Pulse Generator Implant Date: 20130122
Lead Channel Impedance Value: 418 Ohm
Lead Channel Impedance Value: 661 Ohm
Lead Channel Pacing Threshold Amplitude: 0.5 V
Lead Channel Pacing Threshold Amplitude: 0.75 V
Lead Channel Pacing Threshold Pulse Width: 0.4 ms
Lead Channel Pacing Threshold Pulse Width: 0.4 ms
Lead Channel Setting Pacing Amplitude: 1.5 V
Lead Channel Setting Pacing Amplitude: 2 V
Lead Channel Setting Pacing Pulse Width: 0.4 ms
Lead Channel Setting Sensing Sensitivity: 4 mV

## 2021-01-15 NOTE — Progress Notes (Signed)
Remote pacemaker transmission.   

## 2021-02-04 ENCOUNTER — Ambulatory Visit (INDEPENDENT_AMBULATORY_CARE_PROVIDER_SITE_OTHER): Payer: Medicare Other | Admitting: Gastroenterology

## 2021-02-04 ENCOUNTER — Other Ambulatory Visit: Payer: Self-pay

## 2021-02-04 ENCOUNTER — Encounter: Payer: Self-pay | Admitting: Gastroenterology

## 2021-02-04 VITALS — BP 117/78 | HR 94 | Temp 97.8°F | Wt 142.8 lb

## 2021-02-04 DIAGNOSIS — Z8619 Personal history of other infectious and parasitic diseases: Secondary | ICD-10-CM

## 2021-02-04 DIAGNOSIS — Z8711 Personal history of peptic ulcer disease: Secondary | ICD-10-CM

## 2021-02-05 NOTE — Progress Notes (Signed)
Vonda Antigua, MD [address]  [place]  [place], [place] [address]  Main: [phone]  Fax: [phone]   Primary Care Physician: Lesleigh Noe, MD   Chief Complaint  Patient presents with  . history of stomach ulcers    HPI: Regina Hernandez is a 87 y.o. female with history of gastric ulcers and C. difficile here for follow-up.  Patient presents with her daughter. The patient denies abdominal or flank pain, anorexia, nausea or vomiting, dysphagia, change in bowel habits or black or bloody stools or weight loss.  Reports good appetite.  Has not been taking any NSAIDs.  Previous history: patient was found to have nonbleeding gastric ulcers and upper endoscopy done by Dr. Alice Reichert in June 2021 when patient was admitted with GI bleed.  patient has been admitted to the hospital for C. difficile and completed Dificid treatment on July 30, 2020.  She took Protonix for about a month and then ran out of the medication with no refills as that is how it was ordered.  She was also taking sucralfate with it too.  Patient reported taking Aleve for 2 weeks prior to the diagnosis of her gastric ulcers.  Since then she has not been taking any NSAIDs and uses Tylenol as needed for headaches.  No prior history of GI bleed.  Patient reports history of colonoscopy about 3-4 colonoscopies during her lifetime, which [place] state.  Procedure report not available.  States during her last colonoscopy she was told she does not need any further colonoscopies due to her age.  States no polyps were found on her most recent colonoscopy.  Current Outpatient Medications  Medication Sig Dispense Refill  . acetaminophen (TYLENOL) 500 MG tablet Take 2 tablets (1,000 mg total) by mouth every 8 (eight) hours as needed for mild pain or moderate pain. 30 tablet 0  . aspirin EC 81 MG tablet Take 81 mg by mouth daily. Swallow whole.    . Cholecalciferol (VITAMIN D3 PO) Take 25 mcg by mouth daily.    Marland Kitchen  estradiol (ESTRACE) 0.1 MG/GM vaginal cream Place 1 Applicatorful vaginally as needed.    . fluvoxaMINE (LUVOX) 50 MG tablet Take 50 mg by mouth 2 (two) times daily.    Marland Kitchen gabapentin (NEURONTIN) 100 MG capsule Take 2 capsules (200 mg total) by mouth 2 (two) times daily. 360 capsule 1  . memantine (NAMENDA) 10 MG tablet Take 10 mg by mouth 2 (two) times daily.    . midodrine (PROAMATINE) 5 MG tablet Take 1 tablet (5 mg total) by mouth 3 (three) times daily with meals. 90 tablet 5  . mirabegron ER (MYRBETRIQ) 25 MG TB24 tablet Take 1 tablet (25 mg total) by mouth daily. 30 tablet 11  . primidone (MYSOLINE) 50 MG tablet Take 2 tablets (100 mg total) by mouth at bedtime. 180 tablet 1  . venlafaxine XR (EFFEXOR-XR) 75 MG 24 hr capsule Take 75 mg by mouth daily.    . vitamin B-12 (CYANOCOBALAMIN) 1000 MCG tablet Take 1,000 mcg by mouth daily.     No current facility-administered medications for this visit.    Allergies as of 02/04/2021 - Review Complete 02/04/2021  Allergen Reaction Noted  . Adhesive [tape] Other (See Comments) 02/04/2020  . Baclofen Rash 02/04/2020  . Lyrica [pregabalin] Other (See Comments) 02/04/2020  . Sulfa antibiotics Rash 02/04/2020    ROS:  General: Negative for anorexia, weight loss, fever, chills, fatigue, weakness. ENT: Negative for hoarseness, difficulty swallowing ,  nasal congestion. CV: Negative for chest pain, angina, palpitations, dyspnea on exertion, peripheral edema.  Respiratory: Negative for dyspnea at rest, dyspnea on exertion, cough, sputum, wheezing.  GI: See history of present illness. GU:  Negative for dysuria, hematuria, urinary incontinence, urinary frequency, nocturnal urination.  Endo: Negative for unusual weight change.    Physical Examination:   BP 117/78   Pulse 94   Temp 97.8 F (36.6 C) (Oral)   Wt 142 lb 12.8 oz (64.8 kg)   BMI 24.51 kg/m   General: Well-nourished, well-developed in no acute distress.  Eyes: No icterus.  Conjunctivae pink. Mouth: Oropharyngeal mucosa moist and pink , no lesions erythema or exudate. Neck: Supple, Trachea midline Abdomen: Bowel sounds are normal, nontender, nondistended, no hepatosplenomegaly or masses, no abdominal bruits or hernia , no rebound or guarding.   Extremities: No lower extremity edema. No clubbing or deformities. Neuro: Alert and oriented x 3.  Grossly intact. Skin: Warm and dry, no jaundice.   Psych: Alert and cooperative, normal mood and affect.   Labs: CMP     Component Value Date/Time   NA 137 07/23/2020 0415   NA 144 02/04/2020 1439   K 3.2 (L) 07/23/2020 0415   CL 109 07/23/2020 0415   CO2 22 07/23/2020 0415   GLUCOSE 91 07/23/2020 0415   BUN 9 07/23/2020 0415   BUN 17 02/04/2020 1439   CREATININE 0.70 07/23/2020 0415   CALCIUM 8.7 (L) 07/23/2020 0415   PROT 6.8 07/21/2020 1056   PROT 6.8 02/04/2020 1439   ALBUMIN 3.8 07/21/2020 1056   ALBUMIN 4.3 02/04/2020 1439   AST 19 07/21/2020 1056   ALT 13 07/21/2020 1056   ALKPHOS 55 07/21/2020 1056   BILITOT 1.0 07/21/2020 1056   BILITOT 0.2 02/04/2020 1439   GFRNONAA >60 07/23/2020 0415   GFRAA >60 07/23/2020 0415   Lab Results  Component Value Date   WBC 10.2 08/04/2020   HGB 13.3 08/04/2020   HCT 41.4 08/04/2020   MCV 96 08/04/2020   PLT 211 08/04/2020    Imaging Studies: No results found.  Assessment and Plan:   Regina Hernandez is a 87 y.o. y/o female here for follow-up of history of gastric ulcers and C. difficile in 2021, which occurred in the setting of NSAID use and patient has been avoiding NSAIDs and has been asymptomatic since then  H. pylori breath test off PPI was negative on last visit Patient has been asymptomatic and has been avoiding NSAIDs  No further work-up needed at this time If symptoms recur patient was advised to call us back and she verbalized understanding  Hemoglobin normal at 13 on last check    Dr Vonda Antigua

## 2021-02-10 ENCOUNTER — Other Ambulatory Visit: Payer: Self-pay

## 2021-02-10 ENCOUNTER — Encounter: Payer: Self-pay | Admitting: Family Medicine

## 2021-02-10 ENCOUNTER — Ambulatory Visit (INDEPENDENT_AMBULATORY_CARE_PROVIDER_SITE_OTHER): Payer: Medicare Other | Admitting: Family Medicine

## 2021-02-10 VITALS — BP 110/84 | HR 76 | Temp 97.1°F | Ht 64.0 in | Wt 139.0 lb

## 2021-02-10 DIAGNOSIS — F323 Major depressive disorder, single episode, severe with psychotic features: Secondary | ICD-10-CM | POA: Diagnosis not present

## 2021-02-10 DIAGNOSIS — G3184 Mild cognitive impairment, so stated: Secondary | ICD-10-CM | POA: Diagnosis not present

## 2021-02-10 DIAGNOSIS — I495 Sick sinus syndrome: Secondary | ICD-10-CM

## 2021-02-10 DIAGNOSIS — I951 Orthostatic hypotension: Secondary | ICD-10-CM

## 2021-02-10 DIAGNOSIS — K921 Melena: Secondary | ICD-10-CM | POA: Diagnosis not present

## 2021-02-10 NOTE — Patient Instructions (Signed)
Return for annual visit in about 6 months  Call or Mychart - if you notice more of the dark stool - would want to repeat labs sooner

## 2021-02-10 NOTE — Progress Notes (Signed)
Subjective:     Regina Hernandez is a 87 y.o. female presenting for Transfer of Care (Previous pt of Tor Netters. Here with daughter, Regina Hernandez.)     HPI  Doing well  #Depression - recently saw Dr. Roland Earl with psych - started abilify - to help with sleep - only started 1 week - waking up every hour - no nocturia - Fluvoxamine was decreased - rispiradone was stopped - venlataxine was increased  #urge - dr. Junious Silk - on myrbetriq   #melena - today - hx of bleeding ulcers - previously with aleve - but no recent use  Review of Systems   Social History   Tobacco Use  Smoking Status Never Smoker  Smokeless Tobacco Never Used        Objective:    BP Readings from Last 3 Encounters:  02/10/21 110/84  02/04/21 117/78  10/31/20 (!) 142/98   Wt Readings from Last 3 Encounters:  02/10/21 139 lb (63 kg)  02/04/21 142 lb 12.8 oz (64.8 kg)  10/31/20 138 lb (62.6 kg)    BP 110/84 (BP Location: Left Arm, Patient Position: Sitting, Cuff Size: Large)   Pulse 76   Temp (!) 97.1 F (36.2 C)   Ht 5\' 4"  (1.626 m)   Wt 139 lb (63 kg)   SpO2 97%   BMI 23.86 kg/m    Physical Exam Constitutional:      General: She is not in acute distress.    Appearance: She is well-developed. She is not diaphoretic.  HENT:     Right Ear: External ear normal.     Left Ear: External ear normal.  Eyes:     Conjunctiva/sclera: Conjunctivae normal.  Cardiovascular:     Rate and Rhythm: Normal rate and regular rhythm.     Heart sounds: Murmur heard.    Pulmonary:     Effort: Pulmonary effort is normal. No respiratory distress.     Breath sounds: Normal breath sounds. No wheezing.  Musculoskeletal:     Cervical back: Neck supple.  Skin:    General: Skin is warm and dry.     Capillary Refill: Capillary refill takes less than 2 seconds.  Neurological:     Mental Status: She is alert. Mental status is at baseline.  Psychiatric:        Mood and Affect: Mood normal.         Behavior: Behavior normal.           Assessment & Plan:   Problem List Items Addressed This Visit      Cardiovascular and Mediastinum   Sick sinus syndrome ([place])    Pacermaker in place. Doing well. Apprec cards support.       Orthostatic hypotension    Stable symptoms with midodrine 5 mg TID. Appreciate Cardiology support.         Digestive   Melena    Episode x 1. Hx of significant GI bleed requiring transfusion. Currently in usual state of health. Advised monitoring and if persisting but stable will check labs. ER precautions if worsening        Nervous and Auditory   Mild cognitive impairment with memory loss    Follows with Dr. Krista Blue. On menantine with improvement. Appreciate neuro support continue this.         Other   Major depression with psychotic features ([place]) - Primary    Follows with Dr. Lenice Pressman, recent medication changes. Poor sleep currently. Appreciate psych support. Cont effexor 75 mg TID, fluvoxamine  50 mg daily, abilify 2 mg daily.           Return in about 6 months (around 08/10/2021) for Annual wellness.  Lesleigh Noe, MD  This visit occurred during the SARS-CoV-2 public health emergency.  Safety protocols were in place, including screening questions prior to the visit, additional usage of staff PPE, and extensive cleaning of exam room while observing appropriate contact time as indicated for disinfecting solutions.

## 2021-02-10 NOTE — Assessment & Plan Note (Addendum)
Follows with Dr. Krista Blue. On menantine with improvement. Appreciate neuro support continue this.

## 2021-02-10 NOTE — Assessment & Plan Note (Signed)
Pacermaker in place. Doing well. Apprec cards support.

## 2021-02-10 NOTE — Assessment & Plan Note (Signed)
Stable symptoms with midodrine 5 mg TID. Appreciate Cardiology support.

## 2021-02-10 NOTE — Assessment & Plan Note (Signed)
Episode x 1. Hx of significant GI bleed requiring transfusion. Currently in usual state of health. Advised monitoring and if persisting but stable will check labs. ER precautions if worsening

## 2021-02-10 NOTE — Assessment & Plan Note (Signed)
Follows with Dr. Lenice Pressman, recent medication changes. Poor sleep currently. Appreciate psych support. Cont effexor 75 mg TID, fluvoxamine 50 mg daily, abilify 2 mg daily.

## 2021-02-12 ENCOUNTER — Telehealth: Payer: Self-pay | Admitting: Gastroenterology

## 2021-02-12 DIAGNOSIS — K921 Melena: Secondary | ICD-10-CM

## 2021-02-12 NOTE — Telephone Encounter (Signed)
Patient is having black stools the morning of the 15, 16, and 17. The stools are soft. She denies any abdominal pain. They do not want patient to end up in the hospital again and wants to catch this early. Please advised what you recommend

## 2021-02-12 NOTE — Telephone Encounter (Signed)
Patient/s daught, Regina Hernandez, calling.  She says her Mother has black stools for the past 3 days.  Daughter wants to know if she needs labs, please call to advise

## 2021-02-12 NOTE — Telephone Encounter (Signed)
Patient daughter states they will come to have blood work today at our office.

## 2021-02-13 LAB — CBC
Hematocrit: 39.1 % (ref 34.0–46.6)
Hemoglobin: 12.7 g/dL (ref 11.1–15.9)
MCH: 30.8 pg (ref 26.6–33.0)
MCHC: 32.5 g/dL (ref 31.5–35.7)
MCV: 95 fL (ref 79–97)
Platelets: 183 10*3/uL (ref 150–450)
RBC: 4.13 x10E6/uL (ref 3.77–5.28)
RDW: 12 % (ref 11.7–15.4)
WBC: 8.6 10*3/uL (ref 3.4–10.8)

## 2021-03-11 ENCOUNTER — Ambulatory Visit: Payer: Medicare Other | Admitting: Family Medicine

## 2021-03-13 ENCOUNTER — Ambulatory Visit (INDEPENDENT_AMBULATORY_CARE_PROVIDER_SITE_OTHER): Payer: Medicare Other | Admitting: Physician Assistant

## 2021-03-13 ENCOUNTER — Encounter: Payer: Self-pay | Admitting: Physician Assistant

## 2021-03-13 ENCOUNTER — Other Ambulatory Visit: Payer: Self-pay

## 2021-03-13 VITALS — BP 124/81 | HR 90 | Ht 64.0 in | Wt 144.0 lb

## 2021-03-13 DIAGNOSIS — R351 Nocturia: Secondary | ICD-10-CM

## 2021-03-13 DIAGNOSIS — R8271 Bacteriuria: Secondary | ICD-10-CM

## 2021-03-13 LAB — URINALYSIS, COMPLETE
Bilirubin, UA: NEGATIVE
Glucose, UA: NEGATIVE
Ketones, UA: NEGATIVE
Nitrite, UA: NEGATIVE
Protein,UA: NEGATIVE
Specific Gravity, UA: <1.005 — ABNORMAL LOW (ref 1.005–1.030)
Urobilinogen, Ur: 0.2 mg/dL (ref 0.2–1.0)
pH, UA: 6.5 (ref 5.0–7.5)

## 2021-03-13 LAB — MICROSCOPIC EXAMINATION

## 2021-03-13 LAB — BLADDER SCAN AMB NON-IMAGING

## 2021-03-13 MED ORDER — MIRABEGRON ER 50 MG PO TB24: 50.0000 mg | ORAL_TABLET | Freq: Every day | ORAL | 11 refills | Status: DC

## 2021-03-13 NOTE — Patient Instructions (Addendum)
Switch Myrbetriq to nighttime. I have sent in a new prescription for the increased dose of this medication (50mg ). You appear to be on the lower dose right now (25mg ), however I originally gave you samples of the increased dose and suspect this may be playing a role in your increased nighttime urination.  Switch from Dulcolax to Miralax for management of your chronic constipation. You may increase or decrease the recommended dose of one capful of powder daily to maintain formed stools that are easy to pass.

## 2021-03-13 NOTE — Progress Notes (Signed)
03/13/2021 10:46 AM   Regina Hernandez 07-Mar-1934 [id number]  CC: Chief Complaint  Patient presents with  . Urinary Frequency   HPI: Regina Hernandez is a 87 y.o. female with OAB on Myrbetriq 25mg  daily and asymptomatic bacteriuria who presents today for 36-month symptom recheck.  She is accompanied today by her daughter, who contributes to HPI.  Today she reports new nocturia x3, previously x1, over the past 3 to 4 weeks.  This change coincided with multiple medication changes by her psychiatrist including stopping risperidone, increasing venlafaxine to 3 times daily, decreasing fluvoxamine to daily, and starting Abilify daily in the morning.  She denies daytime frequency.  She denies BLE edema.  She has a history of snoring but recently underwent a sleep study with no findings of OSA.  She does not take any sleep aids or diuretics.  She reports drinking 3-4 large glasses of water, typically in the morning, and only sips with nighttime meds.  She denies dysuria and states that her chronic constipation may be worsening.  She has been taking Dulcolax nightly before bed but does not believe she is had any recent changes in her dosage.  Notably, patient was recently started on samples of Myrbetriq 50 mg daily but subsequently prescribed the 25 mg dose.  In-office UA today positive for trace lysed blood and trace leukocyte esterase; urine microscopy with 11-30 WBCs/HPF and many bacteria. PVR 67mL.  PMH: Past Medical History:  Diagnosis Date  . Anxiety   . Cervical dystonia   . Essential tremor   . GI bleed   . Memory loss   . Migraine   . Occipital neuralgia     Surgical History: Past Surgical History:  Procedure Laterality Date  . BOWEL RESECTION    . CARDIAC CATHETERIZATION  2016   no stents/[facility]  . ESOPHAGOGASTRODUODENOSCOPY N/A 06/18/2020   Procedure: ESOPHAGOGASTRODUODENOSCOPY (EGD);  Surgeon: Toledo, Benay Pike, MD;  Location: [facility] ENDOSCOPY;  Service:  Gastroenterology;  Laterality: N/A;  . PACEMAKER IMPLANT    . REPLACEMENT TOTAL KNEE Left     Home Medications:  Allergies as of 03/13/2021      Reactions   Adhesive [tape] Other (See Comments)   Contact dermatitis   Baclofen Rash   Lyrica [pregabalin] Other (See Comments)   Unable to remember reaction.   Sulfa Antibiotics Rash      Medication List       Accurate as of March 13, 2021 10:46 AM. If you have any questions, ask your nurse or doctor.        acetaminophen 500 MG tablet Commonly known as: TYLENOL Take 2 tablets (1,000 mg total) by mouth every 8 (eight) hours as needed for mild pain or moderate pain.   ARIPiprazole 2 MG tablet Commonly known as: ABILIFY Take 2 mg by mouth every morning.   aspirin EC 81 MG tablet Take 81 mg by mouth daily. Swallow whole.   docusate sodium 100 MG capsule Commonly known as: COLACE Take 100 mg by mouth daily.   estradiol 0.1 MG/GM vaginal cream Commonly known as: ESTRACE Place 1 Applicatorful vaginally as needed.   fluvoxaMINE 50 MG tablet Commonly known as: LUVOX Take 50 mg by mouth at bedtime.   gabapentin 100 MG capsule Commonly known as: NEURONTIN Take 2 capsules (200 mg total) by mouth 2 (two) times daily.   memantine 10 MG tablet Commonly known as: NAMENDA Take 10 mg by mouth 2 (two) times daily.   midodrine 5 MG tablet Commonly known  as: PROAMATINE Take 1 tablet (5 mg total) by mouth 3 (three) times daily with meals.   mirabegron ER 25 MG Tb24 tablet Commonly known as: MYRBETRIQ Take 1 tablet (25 mg total) by mouth daily.   primidone 50 MG tablet Commonly known as: MYSOLINE Take 2 tablets (100 mg total) by mouth at bedtime.   venlafaxine XR 75 MG 24 hr capsule Commonly known as: EFFEXOR-XR Take 75 mg by mouth 3 (three) times daily.   vitamin B-12 1000 MCG tablet Commonly known as: CYANOCOBALAMIN Take 1,000 mcg by mouth daily.   VITAMIN D3 PO Take 25 mcg by mouth daily.       Allergies:   Allergies  Allergen Reactions  . Adhesive [Tape] Other (See Comments)    Contact dermatitis  . Baclofen Rash  . Lyrica [Pregabalin] Other (See Comments)    Unable to remember reaction.  . Sulfa Antibiotics Rash    Family History: Family History  Problem Relation Age of Onset  . Other Mother        "old age"  . Ulcers Father   . Stomach cancer Brother     Social History:   reports that she has never smoked. She has never used smokeless tobacco. She reports previous alcohol use. She reports that she does not use drugs.  Physical Exam: BP 124/81   Pulse 90   Ht 5\' 4"  (1.626 m)   Wt 144 lb (65.3 kg)   BMI 24.72 kg/m   Constitutional:  Alert and oriented, no acute distress, nontoxic appearing HEENT: [place], AT Cardiovascular: No clubbing, cyanosis, or edema Respiratory: Normal respiratory effort, no increased work of breathing Skin: No rashes, bruises or suspicious lesions Neurologic: Grossly intact, no focal deficits, moving all 4 extremities Psychiatric: Normal mood and affect  Laboratory Data: Results for orders placed or performed in visit on 03/13/21  Microscopic Examination   Urine  Result Value Ref Range   WBC, UA 11-30 (A) 0 - 5 /hpf   RBC 0-2 0 - 2 /hpf   Epithelial Cells (non renal) 0-10 0 - 10 /hpf   Bacteria, UA Many (A) None seen/Few  Urinalysis, Complete  Result Value Ref Range   Specific Gravity, UA <1.005 (L) 1.005 - 1.030   pH, UA 6.5 5.0 - 7.5   Color, UA Straw Yellow   Appearance Ur Clear Clear   Leukocytes,UA Trace (A) Negative   Protein,UA Negative Negative/Trace   Glucose, UA Negative Negative   Ketones, UA Negative Negative   RBC, UA Trace (A) Negative   Bilirubin, UA Negative Negative   Urobilinogen, Ur 0.2 0.2 - 1.0 mg/dL   Nitrite, UA Negative Negative   Microscopic Examination See below:   BLADDER SCAN AMB NON-IMAGING  Result Value Ref Range   Scan Result 80mL    Assessment & Plan:   1. Nocturia Urgency and frequency stable on  Myrbetriq 25 mg daily, however patient reports increased nocturia over the past month coinciding with changes of her psychiatric medications.  No other risk factors for nocturia.  Prescribing the increased dose of Myrbetriq 50 mg daily given she previously tolerated this well.  Counseled her to switch Myrbetriq to nightly.  Additionally, counseled her to switch from Dulcolax to MiraLAX as a nonstimulant laxative alternative for management of her chronic constipation.  Patient expressed understanding. - Urinalysis, Complete - BLADDER SCAN AMB NON-IMAGING - mirabegron ER (MYRBETRIQ) 50 MG TB24 tablet; Take 1 tablet (50 mg total) by mouth daily.  Dispense: 30 tablet; Refill: 11  2. Bacteriuria Asymptomatic today, no indication for antibacterial therapy.  Return in about 1 year (around 03/13/2022) for Symptom recheck with PVR and UA.  Debroah Loop, PA-C  [facility] [address] [place], [place] [address] [phone]

## 2021-03-26 ENCOUNTER — Other Ambulatory Visit: Payer: Self-pay

## 2021-03-26 ENCOUNTER — Ambulatory Visit (INDEPENDENT_AMBULATORY_CARE_PROVIDER_SITE_OTHER): Payer: Medicare Other

## 2021-03-26 DIAGNOSIS — I340 Nonrheumatic mitral (valve) insufficiency: Secondary | ICD-10-CM

## 2021-03-26 LAB — ECHOCARDIOGRAM COMPLETE
AR max vel: 3.8 cm2
AV Area VTI: 3.59 cm2
AV Area mean vel: 3.57 cm2
AV Mean grad: 3 mmHg
AV Peak grad: 4.7 mmHg
Ao pk vel: 1.08 m/s
Area-P 1/2: 3.26 cm2
Calc EF: 53.5 %
S' Lateral: 2.2 cm
Single Plane A2C EF: 50.4 %
Single Plane A4C EF: 55.6 %

## 2021-04-02 ENCOUNTER — Ambulatory Visit (INDEPENDENT_AMBULATORY_CARE_PROVIDER_SITE_OTHER): Payer: Medicare Other

## 2021-04-02 DIAGNOSIS — I495 Sick sinus syndrome: Secondary | ICD-10-CM | POA: Diagnosis not present

## 2021-04-02 LAB — CUP PACEART REMOTE DEVICE CHECK
Battery Impedance: 1465 Ohm
Battery Remaining Longevity: 57 mo
Battery Voltage: 2.76 V
Brady Statistic AP VP Percent: 0 %
Brady Statistic AP VS Percent: 33 %
Brady Statistic AS VP Percent: 0 %
Brady Statistic AS VS Percent: 66 %
Date Time Interrogation Session: 20220407065429
Implantable Lead Implant Date: 20130122
Implantable Lead Implant Date: 20130122
Implantable Lead Location: 753859
Implantable Lead Location: 753860
Implantable Lead Model: 4092
Implantable Lead Model: 5076
Implantable Pulse Generator Implant Date: 20130122
Lead Channel Impedance Value: 397 Ohm
Lead Channel Impedance Value: 673 Ohm
Lead Channel Pacing Threshold Amplitude: 0.5 V
Lead Channel Pacing Threshold Amplitude: 0.625 V
Lead Channel Pacing Threshold Pulse Width: 0.4 ms
Lead Channel Pacing Threshold Pulse Width: 0.4 ms
Lead Channel Setting Pacing Amplitude: 1.5 V
Lead Channel Setting Pacing Amplitude: 2 V
Lead Channel Setting Pacing Pulse Width: 0.4 ms
Lead Channel Setting Sensing Sensitivity: 5.6 mV

## 2021-04-07 ENCOUNTER — Ambulatory Visit (INDEPENDENT_AMBULATORY_CARE_PROVIDER_SITE_OTHER): Payer: Medicare Other | Admitting: Internal Medicine

## 2021-04-07 ENCOUNTER — Other Ambulatory Visit
Admission: RE | Admit: 2021-04-07 | Discharge: 2021-04-07 | Disposition: A | Discharge status: Home / Self Care | Payer: Medicare Other | Attending: Internal Medicine | Admitting: Internal Medicine

## 2021-04-07 ENCOUNTER — Encounter: Payer: Self-pay | Admitting: Internal Medicine

## 2021-04-07 ENCOUNTER — Other Ambulatory Visit: Payer: Self-pay

## 2021-04-07 VITALS — BP 126/96 | HR 82 | Ht 64.0 in | Wt 137.0 lb

## 2021-04-07 DIAGNOSIS — E876 Hypokalemia: Secondary | ICD-10-CM

## 2021-04-07 DIAGNOSIS — I495 Sick sinus syndrome: Secondary | ICD-10-CM

## 2021-04-07 DIAGNOSIS — Z950 Presence of cardiac pacemaker: Secondary | ICD-10-CM

## 2021-04-07 DIAGNOSIS — R61 Generalized hyperhidrosis: Secondary | ICD-10-CM

## 2021-04-07 LAB — BASIC METABOLIC PANEL
Anion gap: 8 (ref 5–15)
BUN: 22 mg/dL (ref 8–23)
CO2: 28 mmol/L (ref 22–32)
Calcium: 10.3 mg/dL (ref 8.9–10.3)
Chloride: 101 mmol/L (ref 98–111)
Creatinine, Ser: 0.97 mg/dL (ref 0.44–1.00)
GFR, Estimated: 57 mL/min — ABNORMAL LOW (ref >60)
Glucose, Bld: 101 mg/dL — ABNORMAL HIGH (ref 70–99)
Potassium: 5.2 mmol/L — ABNORMAL HIGH (ref 3.5–5.1)
Sodium: 137 mmol/L (ref 135–145)

## 2021-04-07 LAB — CBC WITH DIFFERENTIAL/PLATELET
Abs Immature Granulocytes: 0.02 10*3/uL (ref 0.00–0.07)
Basophils Absolute: 0.1 10*3/uL (ref 0.0–0.1)
Basophils Relative: 1 %
Eosinophils Absolute: 0.3 10*3/uL (ref 0.0–0.5)
Eosinophils Relative: 4 %
HCT: 43.1 % (ref 36.0–46.0)
Hemoglobin: 14 g/dL (ref 12.0–15.0)
Immature Granulocytes: 0 %
Lymphocytes Relative: 31 %
Lymphs Abs: 2.7 10*3/uL (ref 0.7–4.0)
MCH: 30.3 pg (ref 26.0–34.0)
MCHC: 32.5 g/dL (ref 30.0–36.0)
MCV: 93.3 fL (ref 80.0–100.0)
Monocytes Absolute: 0.7 10*3/uL (ref 0.1–1.0)
Monocytes Relative: 8 %
Neutro Abs: 4.9 10*3/uL (ref 1.7–7.7)
Neutrophils Relative %: 56 %
Platelets: 210 10*3/uL (ref 150–400)
RBC: 4.62 MIL/uL (ref 3.87–5.11)
RDW: 13 % (ref 11.5–15.5)
WBC: 8.7 10*3/uL (ref 4.0–10.5)
nRBC: 0 % (ref 0.0–0.2)

## 2021-04-07 LAB — C-REACTIVE PROTEIN: CRP: 0.8 mg/dL (ref <1.0)

## 2021-04-07 NOTE — Progress Notes (Signed)
Patient Care Team: Lesleigh Noe, MD as PCP - General (Family Medicine) Marcial Pacas, MD as Consulting Physician (Neurology) Kate Sable, MD as Consulting Physician (Cardiology) Festus Aloe, MD as Consulting Physician (Urology) Norma Fredrickson, MD as Consulting Physician (Psychiatry)   HPI  Regina Hernandez is a 87 y.o. female seen in followup for pacemaker Medtronic implanted elsewhere remotely with gen change 2012 Cx by sepsis requiring extraction and reimplantation 9/12   Biggest complaint is sweating.  Over the last 12 months.  Sometimes soaks not closed.  Is on multiple psychotropics and has had recent up titration of her venlafaxine.  No fevers of which she is aware.  Denies chest pain shortness of breath or peripheral edema.  DATE TEST EF   9/17 MYOVIEW    % No ischemia  10/20 Echo  55-65%   3/21 Echo 55-60%   3/22 Echo  60-65%    Date Cr K Hgb  2/21 1.0 4.9 12.8   7/21 0.7 3.2<<2.8   2/22   12.7     Records and Results Reviewed   Past Medical History:  Diagnosis Date  . Anxiety   . Cervical dystonia   . Essential tremor   . GI bleed   . Memory loss   . Migraine   . Occipital neuralgia     Past Surgical History:  Procedure Laterality Date  . BOWEL RESECTION    . CARDIAC CATHETERIZATION  2016   no stents/[facility]  . ESOPHAGOGASTRODUODENOSCOPY N/A 06/18/2020   Procedure: ESOPHAGOGASTRODUODENOSCOPY (EGD);  Surgeon: Toledo, Benay Pike, MD;  Location: [facility] ENDOSCOPY;  Service: Gastroenterology;  Laterality: N/A;  . PACEMAKER IMPLANT    . REPLACEMENT TOTAL KNEE Left     Current Meds  Medication Sig  . acetaminophen (TYLENOL) 500 MG tablet Take 2 tablets (1,000 mg total) by mouth every 8 (eight) hours as needed for mild pain or moderate pain.  . ARIPiprazole (ABILIFY) 2 MG tablet Take 2 mg by mouth every morning.  Marland Kitchen aspirin EC 81 MG tablet Take 81 mg by mouth daily. Swallow whole.  . Cholecalciferol (VITAMIN D3 PO)  Take 25 mcg by mouth daily.  Marland Kitchen docusate sodium (COLACE) 100 MG capsule Take 100 mg by mouth daily.  Marland Kitchen estradiol (ESTRACE) 0.1 MG/GM vaginal cream Place 1 Applicatorful vaginally as needed.  . fluvoxaMINE (LUVOX) 50 MG tablet Take 50 mg by mouth at bedtime.  . gabapentin (NEURONTIN) 100 MG capsule Take 2 capsules (200 mg total) by mouth 2 (two) times daily.  . memantine (NAMENDA) 10 MG tablet Take 10 mg by mouth 2 (two) times daily.  . midodrine (PROAMATINE) 5 MG tablet Take 1 tablet (5 mg total) by mouth 3 (three) times daily with meals.  . mirabegron ER (MYRBETRIQ) 50 MG TB24 tablet Take 1 tablet (50 mg total) by mouth daily.  . primidone (MYSOLINE) 50 MG tablet Take 2 tablets (100 mg total) by mouth at bedtime.  Marland Kitchen venlafaxine XR (EFFEXOR-XR) 75 MG 24 hr capsule Take 75 mg by mouth 3 (three) times daily.  . vitamin B-12 (CYANOCOBALAMIN) 1000 MCG tablet Take 1,000 mcg by mouth daily.    Allergies  Allergen Reactions  . Adhesive [Tape] Other (See Comments)    Contact dermatitis  . Baclofen Rash  . Lyrica [Pregabalin] Other (See Comments)    Unable to remember reaction.  . Sulfa Antibiotics Rash      Review of Systems negative except from HPI and PMH  Physical Exam BP (!) 126/96  Pulse 82   Ht 5\' 4"  (1.626 m)   Wt 137 lb (62.1 kg)   BMI 23.52 kg/m  Well developed and well nourished in no acute distress HENT normal E scleral and icterus clear Neck Supple JVP flat; carotids brisk and full Clear to ausculation Device pocket well healed; without hematoma or erythema.  There is no tethering  Regular rate and rhythm, no murmurs gallops or rub Soft with active bowel sounds No clubbing cyanosis Trace Edema Alert and oriented, grossly normal motor and sensory function Skin Warm and Dry  ECG sinus at 82 Interval 23/07/38  CrCl cannot be calculated (Patient's most recent lab result is older than the maximum 21 days allowed.).   Assessment and  Plan  Sinus node  dysfunction  Pacemaker-Medtronic (DOI 9/12)   hypokalemia  Blood pressure Elevated  Sweats  Psychiatric history and multiple medications   Device function is normal.  Blood pressure reasonably regulated  Heart rate excursion adequate  Night sweats are concerning.  It may well be related to her venlafaxine and other psychotropic medications; however, also need to be concerned about infection of her implanted device.  We will obtain blood cultures x2; CRP which would be elevated in the context of infection but not so likely elevated if the diaphoresis is secondary to medications,  Potassium was low at her last check.  She is not on any potassium wasting medication; with her history of hypertension she could have hyperaldosteronism.  Prior to embarking on that evaluation we will recheck her metabolic profile  Current medicines are reviewed at length with the patient today .  The patient does not  have concerns regarding medicines.

## 2021-04-07 NOTE — Patient Instructions (Signed)
Medication Instructions:  - Your physician recommends that you continue on your current medications as directed. Please refer to the Current Medication list given to you today.  *If you need a refill on your cardiac medications before your next appointment, please call your pharmacy*   Lab Work: - Your physician recommends that you have lab work today: BMP/ CBC/ CRP/ Blood cultures x 2   Medical Forensic psychologist at [facility] 1st desk on the right to check in, past the screening table   If you have labs (blood work) drawn today and your tests are completely normal, you will receive your results only by: Marland Kitchen MyChart Message (if you have MyChart) OR . A paper copy in the mail If you have any lab test that is abnormal or we need to change your treatment, we will call you to review the results.   Testing/Procedures: - none ordered   Follow-Up: At [facility], you and your health needs are our priority.  As part of our continuing mission to provide you with exceptional heart care, we have created designated Provider Care Teams.  These Care Teams include your primary Cardiologist (physician) and Advanced Practice Providers (APPs -  Physician Assistants and Nurse Practitioners) who all work together to provide you with the care you need, when you need it.  We recommend signing up for the patient portal called "MyChart".  Sign up information is provided on this After Visit Summary.  MyChart is used to connect with patients for Virtual Visits (Telemedicine).  Patients are able to view lab/test results, encounter notes, upcoming appointments, etc.  Non-urgent messages can be sent to your provider as well.   To learn more about what you can do with MyChart, go to [email or web address].    Your next appointment:   1 year(s)  The format for your next appointment:   In Person  Provider:   Virl Axe, MD   Other Instructions n/a

## 2021-04-10 ENCOUNTER — Telehealth: Payer: Self-pay | Admitting: Internal Medicine

## 2021-04-10 DIAGNOSIS — E875 Hyperkalemia: Secondary | ICD-10-CM

## 2021-04-10 NOTE — Telephone Encounter (Signed)
Regina Sprang, MD  04/09/2021 5:22 PM EDT      Please Inform Patient  Labs are normal x elevated potassium. Please asked them to redraw  Thanks

## 2021-04-10 NOTE — Telephone Encounter (Signed)
I called and spoke with the patient's daughter, [name] (ok per [facility]), regarding the patient's lab results.  She is advised that blood cultures are still pending a final result, but are showing no growth thus far.  I have also advised her that the patient's K+ level is mildly elevated at 5.2. I have confirmed with [name] that the patient is not taking any potassium supplements. [name] is aware the patient will need a repeat BMP per Dr. Caryl Comes.  Per [name] she can bring the patient next week to the [facility].  I have advised her to please come sooner in the week to insure the patient's K+ level is trending down.  Orders placed for the [facility]. [name] is aware of the location.

## 2021-04-12 LAB — CULTURE, BLOOD (SINGLE)
Culture: NO GROWTH
Special Requests: ADEQUATE

## 2021-04-13 ENCOUNTER — Other Ambulatory Visit
Admission: RE | Admit: 2021-04-13 | Discharge: 2021-04-13 | Disposition: A | Discharge status: Home / Self Care | Payer: Medicare Other | Attending: Internal Medicine | Admitting: Internal Medicine

## 2021-04-13 DIAGNOSIS — E875 Hyperkalemia: Secondary | ICD-10-CM | POA: Diagnosis present

## 2021-04-13 LAB — BASIC METABOLIC PANEL
Anion gap: 11 (ref 5–15)
BUN: 20 mg/dL (ref 8–23)
CO2: 25 mmol/L (ref 22–32)
Calcium: 10.4 mg/dL — ABNORMAL HIGH (ref 8.9–10.3)
Chloride: 100 mmol/L (ref 98–111)
Creatinine, Ser: 0.92 mg/dL (ref 0.44–1.00)
GFR, Estimated: >60 mL/min (ref >60)
Glucose, Bld: 79 mg/dL (ref 70–99)
Potassium: 4.3 mmol/L (ref 3.5–5.1)
Sodium: 136 mmol/L (ref 135–145)

## 2021-04-15 NOTE — Progress Notes (Signed)
Remote pacemaker transmission.   

## 2021-04-22 ENCOUNTER — Encounter: Payer: Self-pay | Admitting: Neurology

## 2021-04-22 ENCOUNTER — Ambulatory Visit (INDEPENDENT_AMBULATORY_CARE_PROVIDER_SITE_OTHER): Payer: Medicare Other | Admitting: Neurology

## 2021-04-22 VITALS — BP 153/96 | HR 92 | Ht 63.0 in | Wt 137.0 lb

## 2021-04-22 DIAGNOSIS — R519 Headache, unspecified: Secondary | ICD-10-CM

## 2021-04-22 DIAGNOSIS — R251 Tremor, unspecified: Secondary | ICD-10-CM

## 2021-04-22 DIAGNOSIS — G8929 Other chronic pain: Secondary | ICD-10-CM

## 2021-04-22 DIAGNOSIS — G3184 Mild cognitive impairment, so stated: Secondary | ICD-10-CM

## 2021-04-22 MED ORDER — PRIMIDONE 50 MG PO TABS: 50.0000 mg | ORAL_TABLET | Freq: Every day | ORAL | 1 refills | Status: DC

## 2021-04-22 MED ORDER — GABAPENTIN 400 MG PO CAPS: 400.0000 mg | ORAL_CAPSULE | Freq: Two times a day (BID) | ORAL | 5 refills | Status: DC

## 2021-04-22 NOTE — Progress Notes (Signed)
PATIENT: Regina Hernandez DOB: [birth year]  REASON FOR VISIT: follow up HISTORY FROM: patient  HISTORY OF PRESENT ILLNESS: Today 04/22/21  HISTORY Regina Hernandez a 87 year old female, seen in request byher primary care physicianClark, Katherine,accompanied by her daughter [name] for evaluation of constellation of complaints, try to establish neurology care here on February 04, 2020.  I have reviewed and summarized the referring note from the referring physician.She had past medical history of pacemakerplacement.  I was able to review extensive previous records from [facility] neurology by Doctor Lawrence Marseilles, Brooke Bonito, most recent visit was on June 13, 2018, she was seen for occipital neuralgia, migraine, memory loss, cervical dystonia, also tremor, likely to be essential tremor, had occipital nerve block in the past, which was not helpful, started on primidone 50 mg every night, increase to 2 tablets every night, was offered Vicodin 5 325 mg 6 tablets for occipital neuralgia  Patient had longstanding history of depression, lost her husband in 2019, in December 2020, she overdosed herself on 1 bottle of Ambien, was admitted at [facility],  She now lives with her 2 daughters, the most bothersome symptoms for her is memory loss, mildly unsteady gait.  She retired from daycare job, used to enjoying working the yard, was noted to have gradual onset of memory loss since 2019, getting worse since December 2020, she forgets dates, has difficulty operating TV remote control, microwave, and other household appliances, she hopes to receive treatment for her migraine,  She is on polypharmacy treatment, including primidone 50 mg 3 tablets every night for tremor, Topamax 50 mg 3 times a day for migraine headaches, gabapentin 400 mg 2 tablets 3 times a day for chronic pain, and also Risperdal 2 mg at bedtime, Effexor 75 mg daily, fluvoxamine 50 mg twice a day,  She has a long history of migraine  headaches, overall has been improved,  Update April 22, 2020 SS: Here today for follow-up accompanied by her daughter.  Since last seen, she was able to taper off Topamax.  Lower her dose of gabapentin and primidone.  She is on gabapentin for occipital neuralgia and neuropathy, was able to decrease from 800 mg 3 times a day, down to 400 mg twice a day. She is having more pain, wonder about a little higher dose, feet and legs numb in the evening, doesn't sleep with pillow because occipital neuralgia.  Decrease primidone from 150 mg at bedtime, to 100 mg at bedtime.  She has noticed slight increase in her tremors with handwriting.  Overall, with medication adjustment, her mind is clearer, she is functioning better, gait is more stable.  She saw her psychiatrist recently, Risperdal was decreased from 2 to 1 mg.  She is overall doing well.  CT head showed no acute abnormality, evidence of mild generalized atrophy, supratentorium small vessel disease, left anterior temporal fossa arachnoid cyst, which is chronic and benign  Laboratory evaluation TSH, RPR, B12, CBC, CMP were unremarkable  EEG CONCLUSION: This isan abnormal EEG.There is electrodiagnostic evidence of generalized background slowing, consistent with mild bihemispheric malfunction, common etiology metabolic toxic, and central nervous system degenerative disorder.   Update October 22, 2020 SS: Here today with her daughter, was hospitalized in the summer with syncope felt to be related to C. difficile, has been placed on Midodrine.  Lives with her daughters, does not drive, no falls.  Memory is improved, on Namenda 10 mg twice a day, tolerating well.  Does her own daily activities, no real hobbies, enjoys watching  TV.  Has cut back gabapentin 400 mg twice a day, denies significant head pain.  Weaned off Topamax.  Sees urology, placed on Myrbetriq.  Follows with psychiatry, on Luvox, Risperdal 1 mg at bedtime, Effexor 75 mg daily.  Tremor stable  on primidone, notices with handwriting, eating, not significantly bothersome.  MMSE 25/30 today.  Update April 22, 2021 SS: Here with her daughter, claims worsening headaches frontal headaches, occipital headaches. On gabapentin 200 mg 2 times daily, wants higher dosing. Lengthy history of migraines, even Botox in past.   Hot flashes, hair sweating. Her psychiatrist is going to take her off the Abilify.   Tremor in right hand, mostly with eating and writing.   MMSE 22/30 today, remains on Namenda. Lives with her daughter. Plays cards everyday to stimulate mind, crosswords, does well.   Main issue is headaches, are typical pattern for here. Here today with daughter, [name].   REVIEW OF SYSTEMS: Out of a complete 14 system review of symptoms, the patient complains only of the following symptoms, and all other reviewed systems are negative.  Tremor, memory loss  ALLERGIES: Allergies  Allergen Reactions  . Adhesive [Tape] Other (See Comments)    Contact dermatitis  . Baclofen Rash  . Lyrica [Pregabalin] Other (See Comments)    Unable to remember reaction.  . Sulfa Antibiotics Rash    HOME MEDICATIONS: Outpatient Medications Prior to Visit  Medication Sig Dispense Refill  . acetaminophen (TYLENOL) 500 MG tablet Take 2 tablets (1,000 mg total) by mouth every 8 (eight) hours as needed for mild pain or moderate pain. 30 tablet 0  . ARIPiprazole (ABILIFY) 2 MG tablet Take 2 mg by mouth every morning.    Marland Kitchen aspirin EC 81 MG tablet Take 81 mg by mouth daily. Swallow whole.    . Cholecalciferol (VITAMIN D3 PO) Take 25 mcg by mouth daily.    Marland Kitchen docusate sodium (COLACE) 100 MG capsule Take 100 mg by mouth daily.    Marland Kitchen estradiol (ESTRACE) 0.1 MG/GM vaginal cream Place 1 Applicatorful vaginally as needed.    . fluvoxaMINE (LUVOX) 50 MG tablet Take 50 mg by mouth at bedtime.    . gabapentin (NEURONTIN) 100 MG capsule Take 2 capsules (200 mg total) by mouth 2 (two) times daily. 360 capsule 1  .  memantine (NAMENDA) 10 MG tablet Take 10 mg by mouth 2 (two) times daily.    . midodrine (PROAMATINE) 5 MG tablet Take 1 tablet (5 mg total) by mouth 3 (three) times daily with meals. 90 tablet 5  . mirabegron ER (MYRBETRIQ) 50 MG TB24 tablet Take 1 tablet (50 mg total) by mouth daily. 30 tablet 11  . primidone (MYSOLINE) 50 MG tablet Take 2 tablets (100 mg total) by mouth at bedtime. 180 tablet 1  . venlafaxine XR (EFFEXOR-XR) 75 MG 24 hr capsule Take 75 mg by mouth 3 (three) times daily.    . vitamin B-12 (CYANOCOBALAMIN) 1000 MCG tablet Take 1,000 mcg by mouth daily.     No facility-administered medications prior to visit.    PAST MEDICAL HISTORY: Past Medical History:  Diagnosis Date  . Anxiety   . Cervical dystonia   . Essential tremor   . GI bleed   . Memory loss   . Migraine   . Occipital neuralgia     PAST SURGICAL HISTORY: Past Surgical History:  Procedure Laterality Date  . BOWEL RESECTION    . CARDIAC CATHETERIZATION  2016   no stents/[facility]  . ESOPHAGOGASTRODUODENOSCOPY  N/A 06/18/2020   Procedure: ESOPHAGOGASTRODUODENOSCOPY (EGD);  Surgeon: Toledo, Benay Pike, MD;  Location: [facility] ENDOSCOPY;  Service: Gastroenterology;  Laterality: N/A;  . PACEMAKER IMPLANT    . REPLACEMENT TOTAL KNEE Left     FAMILY HISTORY: Family History  Problem Relation Age of Onset  . Other Mother        "old age"  . Ulcers Father   . Stomach cancer Brother     SOCIAL HISTORY: Social History   Socioeconomic History  . Marital status: Widowed    Spouse name: Not on file  . Number of children: 5  . Years of education: 12th  . Highest education level: High school graduate  Occupational History  . Occupation: Retired  Tobacco Use  . Smoking status: Never Smoker  . Smokeless tobacco: Never Used  Vaping Use  . Vaping Use: Never used  Substance and Sexual Activity  . Alcohol use: Not Currently  . Drug use: Never  . Sexual activity: Not Currently  Other Topics  Concern  . Not on file  Social History Narrative   Lives at home with her daughters.   Right-handed.   Two cups caffeine per day.      02/10/21   From: [place] - moved to [facility] [place]   Living: with daughter [name] and Ronny Bacon granddaughter and [name] (daughter)   Work: retired - daycare       Family: Lives with daughters - [name] and [name], 3 other children across the country - too many to count ~15, a few great-grandchildren      Enjoys: play cards, word search books, watch TV, relaxing outside      Exercise: not currently   Diet: good appetite, cauliflower, chicken, likes most things      Safety   Seat belts: Yes    Guns: No   Safe in relationships: Yes    Social Determinants of Radio broadcast assistant Strain: Not on file  Food Insecurity: Not on file  Transportation Needs: Not on file  Physical Activity: Not on file  Stress: Not on file  Social Connections: Not on file  Intimate Partner Violence: Not on file   PHYSICAL EXAM  Vitals:   04/22/21 0948  BP: (!) 153/96  Pulse: 92  Weight: 137 lb (62.1 kg)  Height: 5\' 3"  (1.6 m)   Body mass index is 24.27 kg/m.  Generalized: Well developed, in no acute distress  MMSE - Mini Mental State Exam 04/22/2021 10/22/2020 02/04/2020  Orientation to time 4 5 4   Orientation to Place 5 5 4   Registration 0 3 3  Attention/ Calculation 1 0 5  Recall 3 3 2   Language- name 2 objects 2 2 2   Language- repeat 1 1 1   Language- follow 3 step command 3 3 3   Language- read & follow direction 1 1 1   Write a sentence 1 1 1   Copy design 1 1 1   Total score 22 25 27     Neurological examination  Mentation: Alert oriented to time, place, history taking. Follows all commands speech and language fluent Cranial nerve II-XII: Pupils were equal round reactive to light. Extraocular movements were full, visual field were full on confrontational test. Facial sensation and strength were normal. Head turning and shoulder shrug were normal and  symmetric. Motor: Good strength all extremities Sensory: Sensory testing is intact to soft touch on all 4 extremities. No evidence of extinction is noted.  Coordination: Cerebellar testing reveals good finger-nose-finger and heel-to-shin bilaterally.  Minimal intention  tremor with finger-nose-finger bilaterally.  No tremor is translated into handwriting or spiral drawl sample. Gait and station: Gait is slightly wide-based, but steady, uses single-point cane Reflexes: Deep tendon reflexes are symmetric but decreased throughout  DIAGNOSTIC DATA (LABS, IMAGING, TESTING) - I reviewed patient records, labs, notes, testing and imaging myself where available.  Lab Results  Component Value Date   WBC 8.7 04/07/2021   HGB 14.0 04/07/2021   HCT 43.1 04/07/2021   MCV 93.3 04/07/2021   PLT 210 04/07/2021      Component Value Date/Time   NA 136 04/13/2021 0836   NA 144 02/04/2020 1439   K 4.3 04/13/2021 0836   CL 100 04/13/2021 0836   CO2 25 04/13/2021 0836   GLUCOSE 79 04/13/2021 0836   BUN 20 04/13/2021 0836   BUN 17 02/04/2020 1439   CREATININE 0.92 04/13/2021 0836   CALCIUM 10.4 (H) 04/13/2021 0836   PROT 6.8 07/21/2020 1056   PROT 6.8 02/04/2020 1439   ALBUMIN 3.8 07/21/2020 1056   ALBUMIN 4.3 02/04/2020 1439   AST 19 07/21/2020 1056   ALT 13 07/21/2020 1056   ALKPHOS 55 07/21/2020 1056   BILITOT 1.0 07/21/2020 1056   BILITOT 0.2 02/04/2020 1439   GFRNONAA >60 04/13/2021 0836   GFRAA >60 07/23/2020 0415   No results found for: CHOL, HDL, LDLCALC, LDLDIRECT, TRIG, CHOLHDL No results found for: HGBA1C Lab Results  Component Value Date   VITAMINB12 475 07/01/2020   Lab Results  Component Value Date   TSH 2.548 06/22/2020   ASSESSMENT AND PLAN 87 y.o. year old female  has a past medical history of Anxiety, Cervical dystonia, Essential tremor, GI bleed, Memory loss, Migraine, and Occipital neuralgia. here with:  1.  Mild cognitive impairment -Stable, MMSE was 22/30  today -Not a candidate for MRI due to pacemaker -Continue Namenda 10 mg twice a day -Laboratory evaluation TSH, RPR, B12, CBC, CMP were unremarkable -CT scan showed no acute abnormality, evidence of mild generalized atrophy, supratentorium small vessel disease, left anterior temporal fossa arachnoid cyst, which is chronic and benign finding -EEG: This isan abnormal EEG.There is electrodiagnostic evidence of generalized background slowing, consistent with mild bihemispheric malfunction, common etiology metabolic toxic, and central nervous system degenerative disorder -Encouraged to continue brain stimulating exercises  2.  Gait abnormality -Likely due to combination of aging, deconditioning, polypharmacy treatment, right knee replacement -No recent falls, none in 2 years   3.  Essential tremor -Tremor is stable, minimal, not especially bothersome -Decrease primidone 50 mg at bedtime  4.  Depression, anxiety, polypharmacy treatment -Seeing psychiatry, planning to discontinue Abilify, remains on Luvox, Effexor  5.  Occipital neuralgia/neuropathy -Have worsened, will slightly increase gabapentin 400 mg twice daily at 1 point was taking 800 mg 3 times daily -With dose reduction of all medications, her gait, and cognition seem to have improved -Follow-up in 6 months or sooner if needed  I spent 30 minutes of face-to-face and non-face-to-face time with patient.  This included previsit chart review, lab review, study review, order entry, electronic health record documentation, patient education.  Butler Denmark, AGNP-C, DNP 04/22/2021, 9:58 AM [facility] [address]  [place], [place] [address] [phone]

## 2021-04-22 NOTE — Patient Instructions (Signed)
Decrease primidone to 50 mg at bedtime  Increase gabapentin 400 mg twice daily for headache  Call for dose adjustments See you back in 6 months

## 2021-04-30 ENCOUNTER — Other Ambulatory Visit: Payer: Self-pay

## 2021-04-30 ENCOUNTER — Encounter: Payer: Self-pay | Admitting: Cardiology

## 2021-04-30 ENCOUNTER — Ambulatory Visit (INDEPENDENT_AMBULATORY_CARE_PROVIDER_SITE_OTHER): Payer: Medicare Other | Admitting: Cardiology

## 2021-04-30 VITALS — BP 128/88 | HR 87 | Ht 63.0 in | Wt 135.0 lb

## 2021-04-30 DIAGNOSIS — Z950 Presence of cardiac pacemaker: Secondary | ICD-10-CM | POA: Diagnosis not present

## 2021-04-30 DIAGNOSIS — I951 Orthostatic hypotension: Secondary | ICD-10-CM | POA: Diagnosis not present

## 2021-04-30 MED ORDER — MIDODRINE HCL 5 MG PO TABS: 5.0000 mg | ORAL_TABLET | Freq: Three times a day (TID) | ORAL | 5 refills | Status: DC

## 2021-04-30 NOTE — Progress Notes (Signed)
Cardiology Office Note:    Date:  04/30/2021   ID:  Regina Hernandez, DOB [birth year], MRN [id number]  PCP:  Lesleigh Noe, MD  Cardiologist:  None  Electrophysiologist:  None   Referring MD: Elby Beck, FNP   Chief Complaint  Patient presents with  . Other    6 month follow up. Meds reviewed verbally with patient.     History of Present Illness:    Regina Hernandez is a 87 y.o. female with a hx of anxiety, bradycardia, orthostatic hypotension, sick sinus syndrome s/p permanent pacemaker (medtronic in 2013) who presents for follow-up.    Patient being seen due to orthostatic hypotension.  Tolerating doses of midodrine which was titrated to 5 mg 3 times daily.  No further episodes of dizziness with ambulation or standing.  No other concerns at this time.  Needs refills for midodrine.   Prior notes She was previously seen at [facility] cardiology in [place].  Patient recently moved to the area from [place].  She denies any symptoms of chest pain or shortness of breath at rest or with exertion.  She is able to do all activities of daily living without chest pain or breathing problems.  She ambulates with a walker.  She states having a history of cardiac valve leakage.  Her pacemaker was placed roughly 7 years ago.  Left heart cath in 2012 at [facility] showed minimal irregularities in the LAD and RCA.  Pacemaker lastly interrogated on 01/16/2020, report states pacemaker was functioning appropriately.Patient recently admitted to the hospital 06/2020 due to weakness, diarrhea and dizziness.  Diagnosed with sepsis likely from colitis.  Managed with IV fluids and antibiotics. Patient was  admitted to the hospital in June 2021 due to bloody stools, diagnosed with peptic ulcer disease, received 2 units of packed red blood cells.  Aspirin was stopped.    Echocardiogram 05/2020 showed normal systolic function, impaired relaxation, EF 65%.  Past Medical History:  Diagnosis Date  .  Anxiety   . Cervical dystonia   . Essential tremor   . GI bleed   . Memory loss   . Migraine   . Occipital neuralgia     Past Surgical History:  Procedure Laterality Date  . BOWEL RESECTION    . CARDIAC CATHETERIZATION  2016   no stents/[facility]  . ESOPHAGOGASTRODUODENOSCOPY N/A 06/18/2020   Procedure: ESOPHAGOGASTRODUODENOSCOPY (EGD);  Surgeon: Toledo, Benay Pike, MD;  Location: [facility] ENDOSCOPY;  Service: Gastroenterology;  Laterality: N/A;  . PACEMAKER IMPLANT    . REPLACEMENT TOTAL KNEE Left     Current Medications: Current Meds  Medication Sig  . acetaminophen (TYLENOL) 500 MG tablet Take 2 tablets (1,000 mg total) by mouth every 8 (eight) hours as needed for mild pain or moderate pain.  . ARIPiprazole (ABILIFY) 2 MG tablet Take 2 mg by mouth every morning.  Marland Kitchen aspirin EC 81 MG tablet Take 81 mg by mouth daily. Swallow whole.  . Cholecalciferol (VITAMIN D3 PO) Take 25 mcg by mouth daily.  Marland Kitchen docusate sodium (COLACE) 100 MG capsule Take 100 mg by mouth daily.  Marland Kitchen estradiol (ESTRACE) 0.1 MG/GM vaginal cream Place 1 Applicatorful vaginally as needed.  . fluvoxaMINE (LUVOX) 50 MG tablet Take 50 mg by mouth at bedtime.  . gabapentin (NEURONTIN) 400 MG capsule Take 1 capsule (400 mg total) by mouth 2 (two) times daily.  . memantine (NAMENDA) 10 MG tablet Take 10 mg by mouth 2 (two) times daily.  . mirabegron ER (MYRBETRIQ) 50  MG TB24 tablet Take 1 tablet (50 mg total) by mouth daily.  . primidone (MYSOLINE) 50 MG tablet Take 1 tablet (50 mg total) by mouth at bedtime.  Marland Kitchen venlafaxine XR (EFFEXOR-XR) 75 MG 24 hr capsule Take 75 mg by mouth 3 (three) times daily.  . vitamin B-12 (CYANOCOBALAMIN) 1000 MCG tablet Take 1,000 mcg by mouth daily.  . [DISCONTINUED] midodrine (PROAMATINE) 5 MG tablet Take 1 tablet (5 mg total) by mouth 3 (three) times daily with meals.     Allergies:   Adhesive [tape], Baclofen, Lyrica [pregabalin], and Sulfa antibiotics   Social History    Socioeconomic History  . Marital status: Widowed    Spouse name: Not on file  . Number of children: 5  . Years of education: 12th  . Highest education level: High school graduate  Occupational History  . Occupation: Retired  Tobacco Use  . Smoking status: Never Smoker  . Smokeless tobacco: Never Used  Vaping Use  . Vaping Use: Never used  Substance and Sexual Activity  . Alcohol use: Not Currently  . Drug use: Never  . Sexual activity: Not Currently  Other Topics Concern  . Not on file  Social History Narrative   Lives at home with her daughters.   Right-handed.   Two cups caffeine per day.      02/10/21   From: [place] - moved to [facility] 2004   Living: with daughter Regina Hernandez and Ronny Bacon granddaughter and Regina Hernandez (daughter)   Work: retired - daycare       Family: Lives with daughters - Regina Hernandez and Regina Hernandez, 3 other children across the country - too many to count ~15, a few great-grandchildren      Enjoys: play cards, word search books, watch TV, relaxing outside      Exercise: not currently   Diet: good appetite, cauliflower, chicken, likes most things      Safety   Seat belts: Yes    Guns: No   Safe in relationships: Yes    Social Determinants of Radio broadcast assistant Strain: Not on file  Food Insecurity: Not on file  Transportation Needs: Not on file  Physical Activity: Not on file  Stress: Not on file  Social Connections: Not on file     Family History: The patient's family history includes Other in her mother; Stomach cancer in her brother; Ulcers in her father.  ROS:   Please see the history of present illness.     All other systems reviewed and are negative.  EKGs/Labs/Other Studies Reviewed:    The following studies were reviewed today:   EKG:  EKG is  ordered today.  EG shows sinus rhythm, first-degree AV block  Recent Labs: 06/22/2020: TSH 2.548 07/01/2020: B Natriuretic Peptide 332.3 07/21/2020: ALT 13 07/24/2020: Magnesium 1.9 04/07/2021:  Hemoglobin 14.0; Platelets 210 04/13/2021: BUN 20; Creatinine, Ser 0.92; Potassium 4.3; Sodium 136  Recent Lipid Panel No results found for: CHOL, TRIG, HDL, CHOLHDL, VLDL, LDLCALC, LDLDIRECT  Physical Exam:    VS:  BP 128/88 (BP Location: Left Arm, Patient Position: Sitting, Cuff Size: Normal)   Pulse 87   Ht 5\' 3"  (1.6 m)   Wt 135 lb (61.2 kg)   SpO2 97%   BMI 23.91 kg/m     Wt Readings from Last 3 Encounters:  04/30/21 135 lb (61.2 kg)  04/22/21 137 lb (62.1 kg)  04/07/21 137 lb (62.1 kg)     GEN:  Well nourished, well developed in no acute distress HEENT:  Normal NECK: No JVD; No carotid bruits LYMPHATICS: No lymphadenopathy CARDIAC: RRR, no murmurs, rubs, gallops RESPIRATORY:  Clear to auscultation without rales, wheezing or rhonchi  ABDOMEN: Soft, non-tender, non-distended MUSCULOSKELETAL:  No edema; No deformity  SKIN: Warm and dry NEUROLOGIC:  Alert and oriented x 3 PSYCHIATRIC:  Normal affect   ASSESSMENT:    1. Orthostatic hypotension   2. Pacemaker    PLAN:    In order of problems listed above:  1. history of dizziness, orthostatic hypotension.  Symptoms have since resolved on midodrine.  Continue midodrine 5 mg 3 times daily.  Refill sent. 2. history of bradycardia, sick sinus syndrome s/p pacemaker.  Continue appointments with device clinic for frequent pacemaker checks.   . Follow-up in 12 months  This note was generated in part or whole with voice recognition software. Voice recognition is usually quite accurate but there are transcription errors that can and very often do occur. I apologize for any typographical errors that were not detected and corrected.  Medication Adjustments/Labs and Tests Ordered: Current medicines are reviewed at length with the patient today.  Concerns regarding medicines are outlined above.  Orders Placed This Encounter  Procedures  . EKG 12-Lead   Meds ordered this encounter  Medications  . midodrine (PROAMATINE) 5 MG  tablet    Sig: Take 1 tablet (5 mg total) by mouth 3 (three) times daily with meals.    Dispense:  90 tablet    Refill:  5    Patient Instructions  Medication Instructions:  Your physician recommends that you continue on your current medications as directed. Please refer to the Current Medication list given to you today.  *If you need a refill on your cardiac medications before your next appointment, please call your pharmacy*   Lab Work: None ordered If you have labs (blood work) drawn today and your tests are completely normal, you will receive your results only by: Marland Kitchen MyChart Message (if you have MyChart) OR . A paper copy in the mail If you have any lab test that is abnormal or we need to change your treatment, we will call you to review the results.   Testing/Procedures: None ordered   Follow-Up: At [facility], you and your health needs are our priority.  As part of our continuing mission to provide you with exceptional heart care, we have created designated Provider Care Teams.  These Care Teams include your primary Cardiologist (physician) and Advanced Practice Providers (APPs -  Physician Assistants and Nurse Practitioners) who all work together to provide you with the care you need, when you need it.  We recommend signing up for the patient portal called "MyChart".  Sign up information is provided on this After Visit Summary.  MyChart is used to connect with patients for Virtual Visits (Telemedicine).  Patients are able to view lab/test results, encounter notes, upcoming appointments, etc.  Non-urgent messages can be sent to your provider as well.   To learn more about what you can do with MyChart, go to [email or web address].    Your next appointment:   1 year(s)  The format for your next appointment:   In Person  Provider:   Kate Sable, MD   Other Instructions      Signed, Kate Sable, MD  04/30/2021 12:24 PM    [place]

## 2021-04-30 NOTE — Patient Instructions (Signed)

## 2021-05-17 ENCOUNTER — Other Ambulatory Visit: Payer: Self-pay | Admitting: Neurology

## 2021-06-04 IMAGING — CR DG CHEST 2V
1 series · 2 of 2 positions shown · non-contrast
Comparison: None.

CLINICAL DATA: Chest pain

EXAM:
CHEST - 2 VIEW

[Series 1: dg chest 2 view · 0.14mm/px · 2 of 2 slices shown]
[im 1/2]
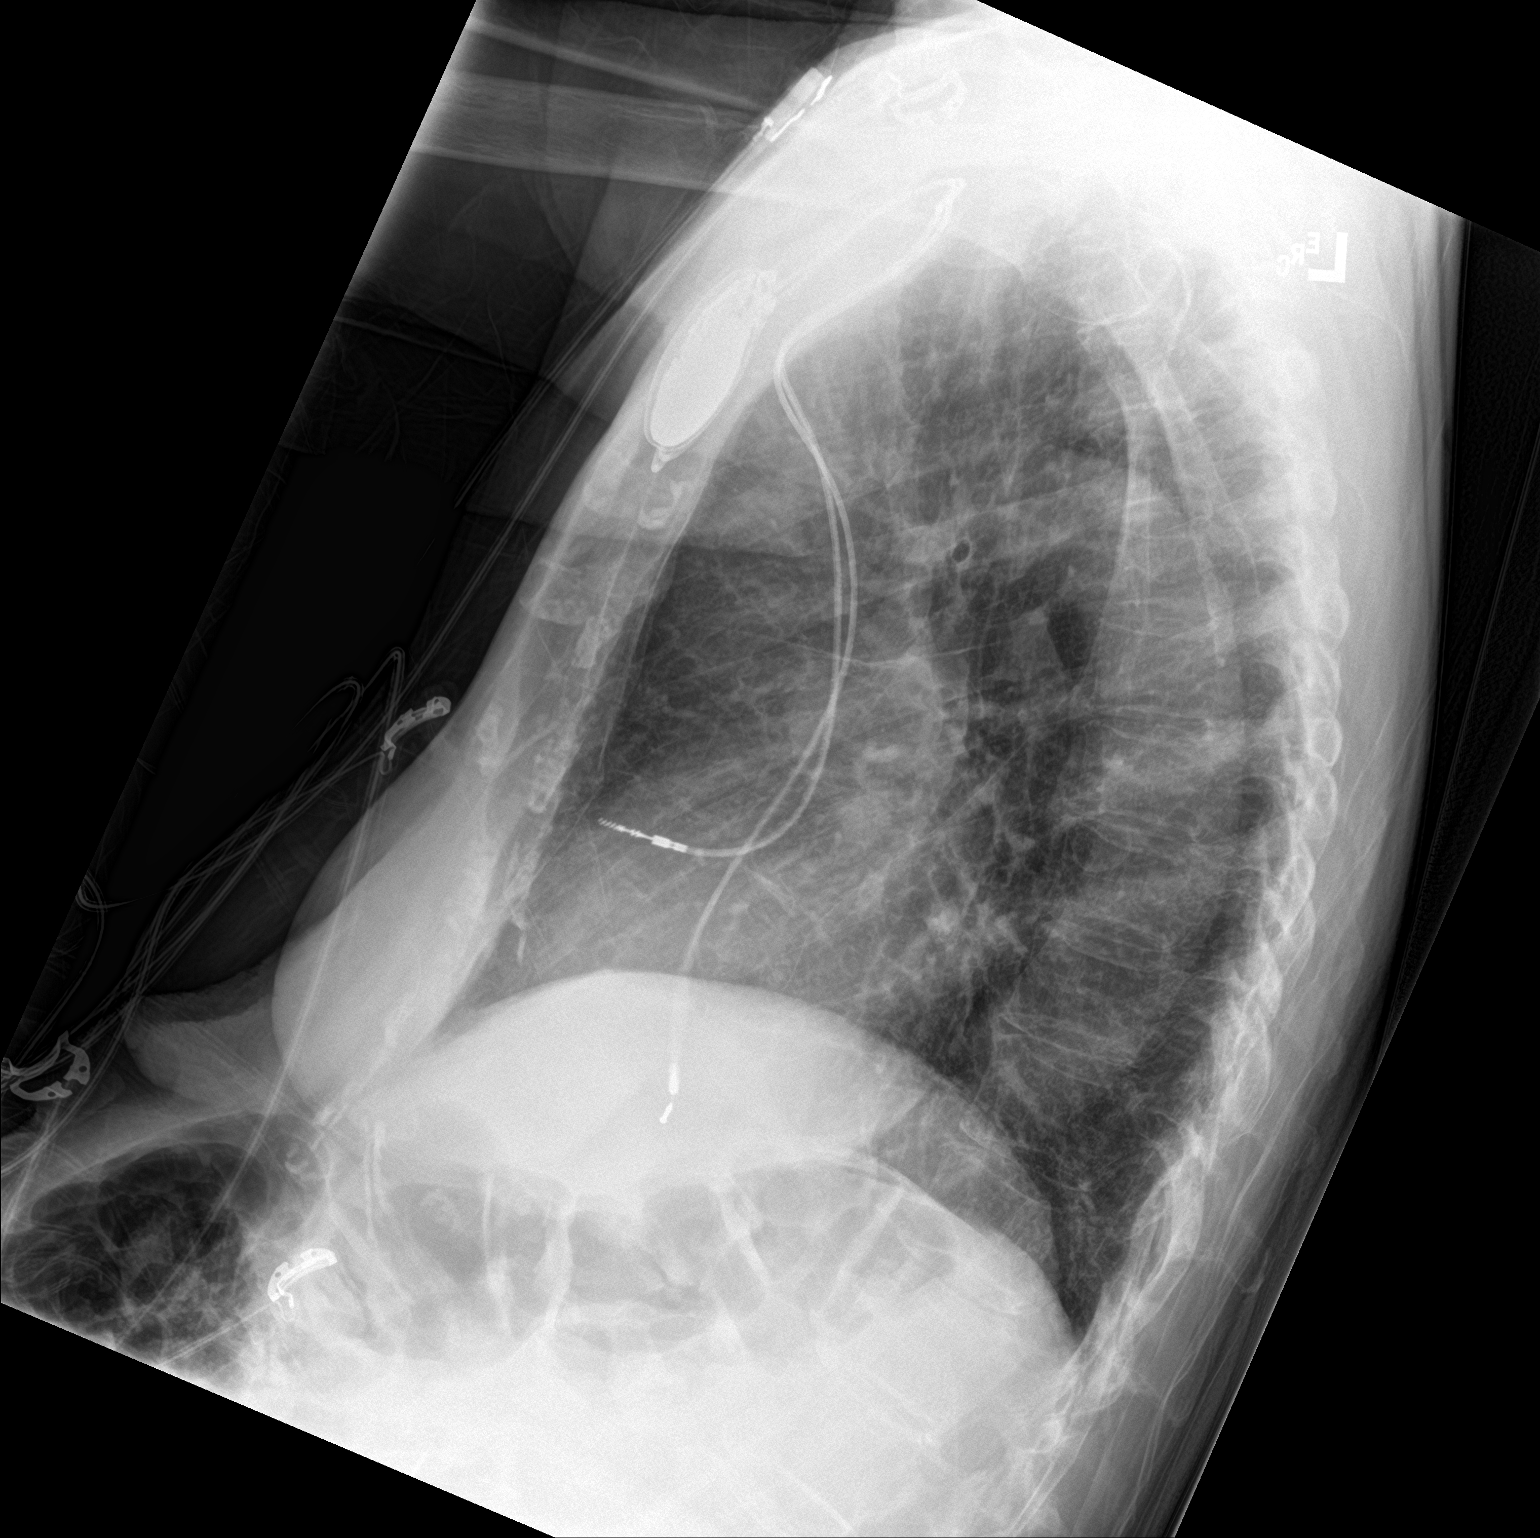
[im 2/2]
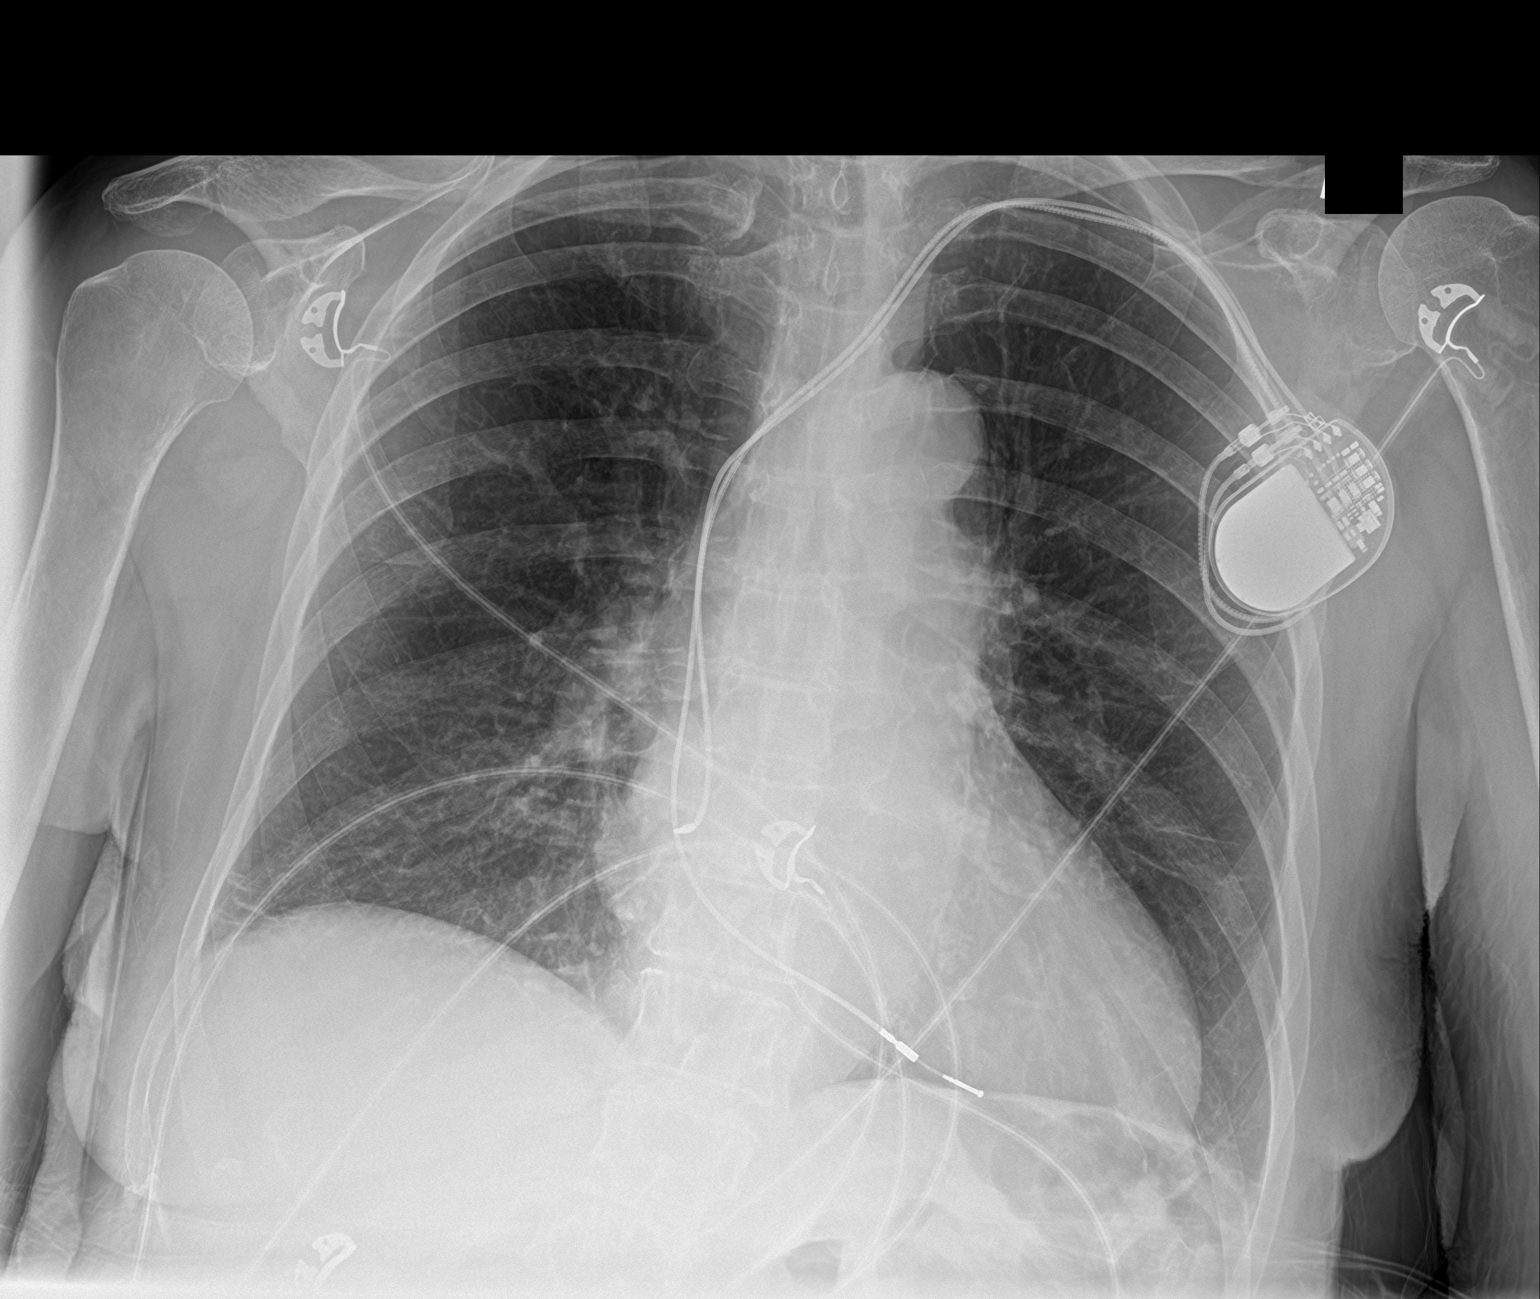

[2 of 2 positions shown; findings below may reference images not displayed]

FINDINGS: The heart size and mediastinal contours are within normal limits.
Both lungs are clear. The visualized skeletal structures are
unremarkable. Expected location of left chest wall pacemaker leads.
IMPRESSION: No active cardiopulmonary disease.

## 2021-06-20 ENCOUNTER — Other Ambulatory Visit: Payer: Self-pay | Admitting: Neurology

## 2021-07-02 ENCOUNTER — Ambulatory Visit (INDEPENDENT_AMBULATORY_CARE_PROVIDER_SITE_OTHER): Payer: Medicare Other

## 2021-07-02 DIAGNOSIS — I495 Sick sinus syndrome: Secondary | ICD-10-CM | POA: Diagnosis not present

## 2021-07-02 LAB — CUP PACEART REMOTE DEVICE CHECK
Battery Impedance: 1635 Ohm
Battery Remaining Longevity: 54 mo
Battery Voltage: 2.75 V
Brady Statistic AP VP Percent: 0 %
Brady Statistic AP VS Percent: 8 %
Brady Statistic AS VP Percent: 0 %
Brady Statistic AS VS Percent: 92 %
Date Time Interrogation Session: 20220707065755
Implantable Lead Implant Date: 20130122
Implantable Lead Implant Date: 20130122
Implantable Lead Location: 753859
Implantable Lead Location: 753860
Implantable Lead Model: 4092
Implantable Lead Model: 5076
Implantable Pulse Generator Implant Date: 20130122
Lead Channel Impedance Value: 387 Ohm
Lead Channel Impedance Value: 692 Ohm
Lead Channel Pacing Threshold Amplitude: 0.5 V
Lead Channel Pacing Threshold Amplitude: 0.5 V
Lead Channel Pacing Threshold Pulse Width: 0.4 ms
Lead Channel Pacing Threshold Pulse Width: 0.4 ms
Lead Channel Setting Pacing Amplitude: 1.5 V
Lead Channel Setting Pacing Amplitude: 2 V
Lead Channel Setting Pacing Pulse Width: 0.4 ms
Lead Channel Setting Sensing Sensitivity: 5.6 mV

## 2021-07-03 IMAGING — CT CT HEAD W/O CM
3 series · 15 of 46 positions shown, 18 images · non-contrast
Comparison: 02/15/2020

CLINICAL DATA: Altered mental status

EXAM:
CT HEAD WITHOUT CONTRAST
TECHNIQUE: Contiguous axial images were obtained from the base of the skull
through the vertex without intravenous contrast.

[Series 2: head wo · axial · 0.47mm/px · z∈[+410,+530]mm · 9 of 29 slices shown, 12 images]
[im 3/29  brain]
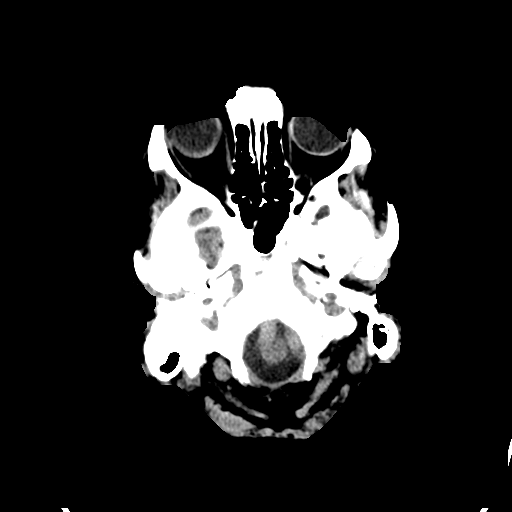
[im 3/29  bone]
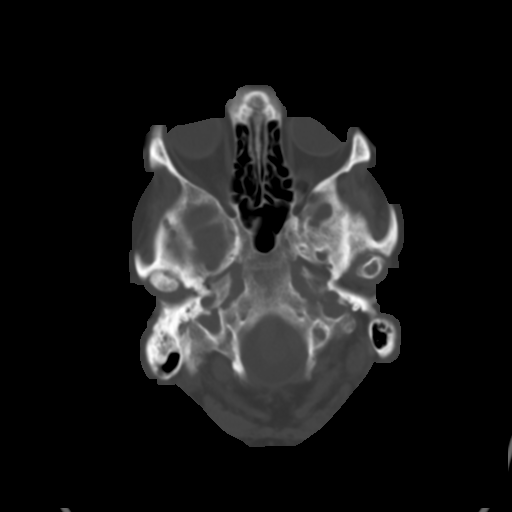
[im 6/29  brain]
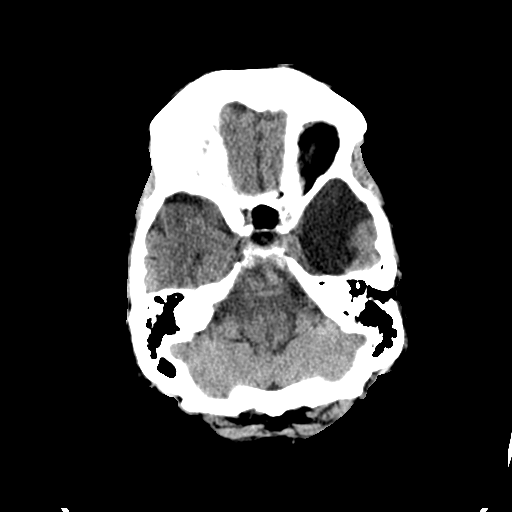
[im 9/29  brain]
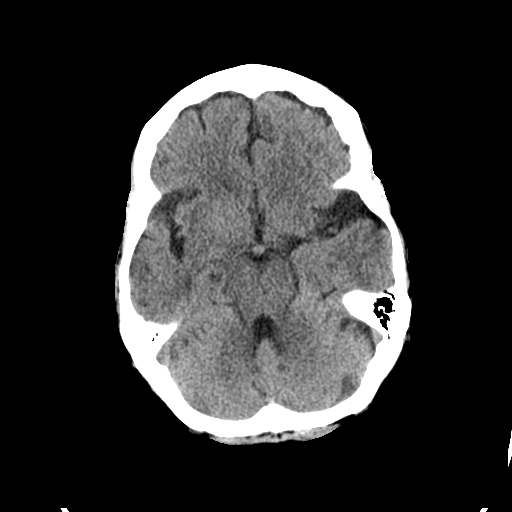
[im 12/29  brain]
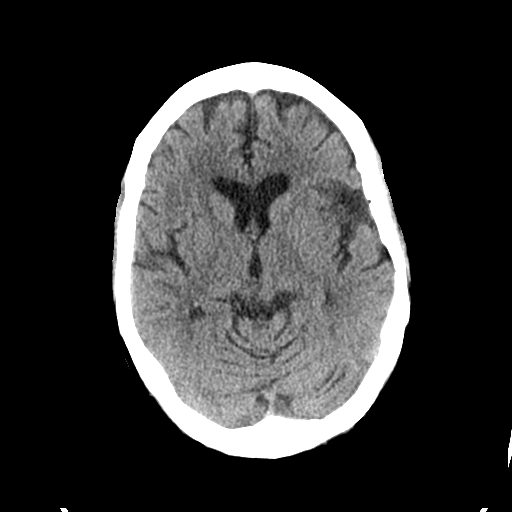
[im 15/29  brain]
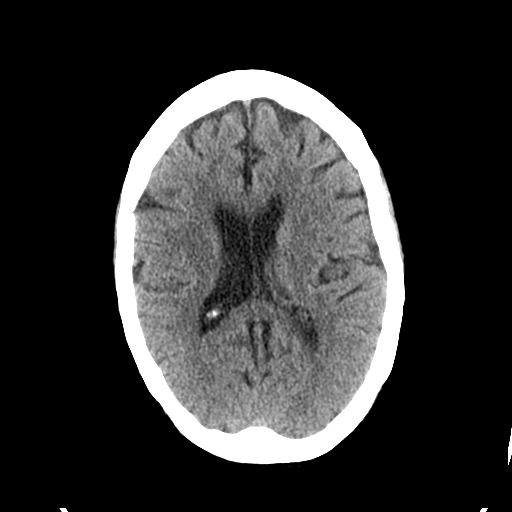
[im 15/29  bone]
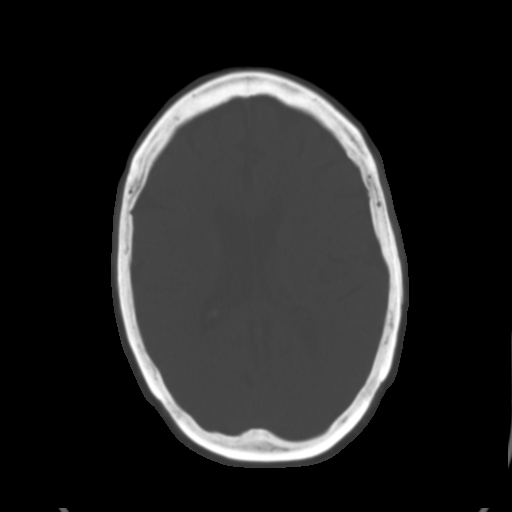
[im 18/29  brain]
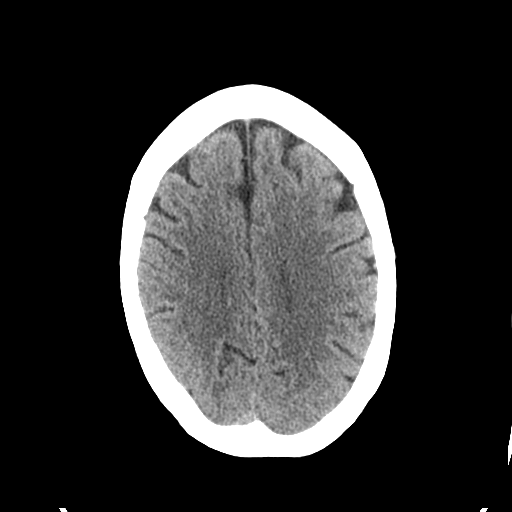
[im 21/29  brain]
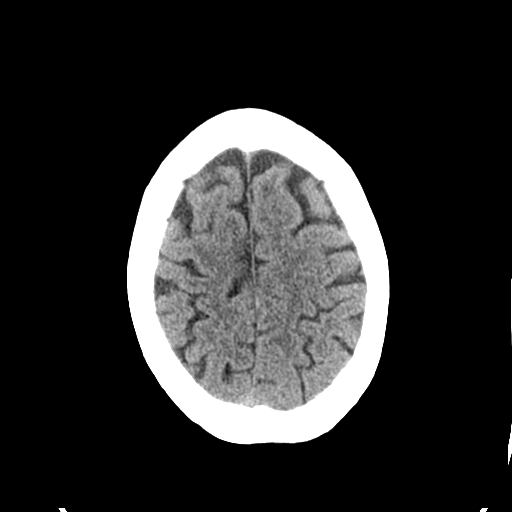
[im 24/29  brain]
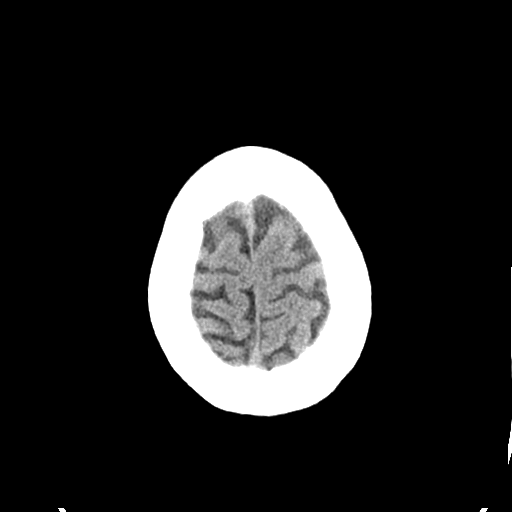
[im 27/29  brain]
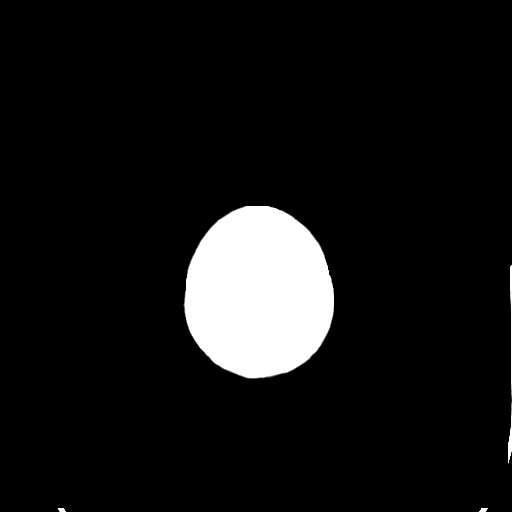
[im 27/29  bone]
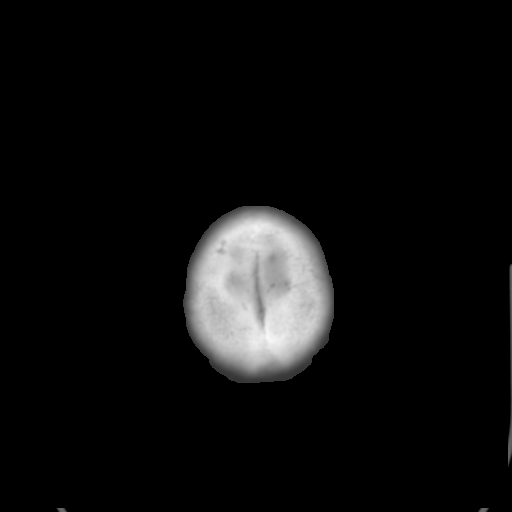

[Series 4: coronal soft tissue · coronal · 0.30mm/px · 3 of 63 slices shown]
[im 21/63  brain]
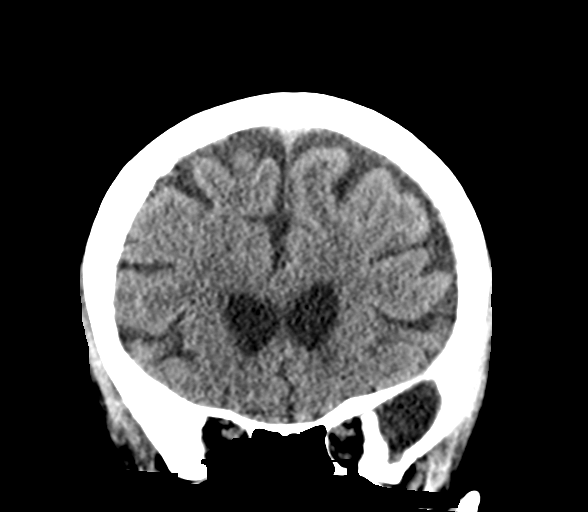
[im 28/63  brain]
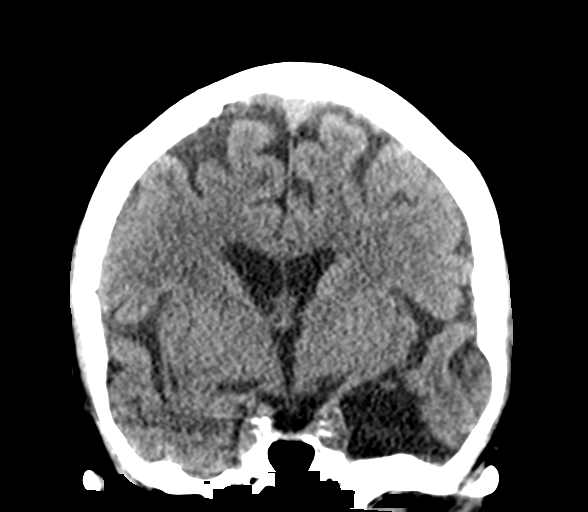
[im 35/63  brain]
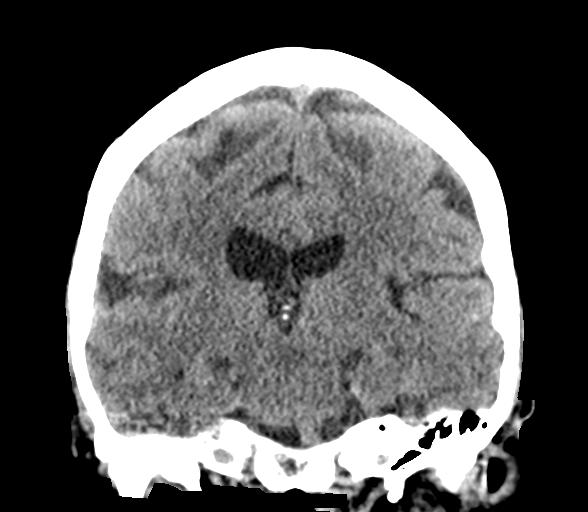

[Series 5: sagittal soft tissue · sagittal · 0.29mm/px · 3 of 46 slices shown]
[im 16/46  brain]
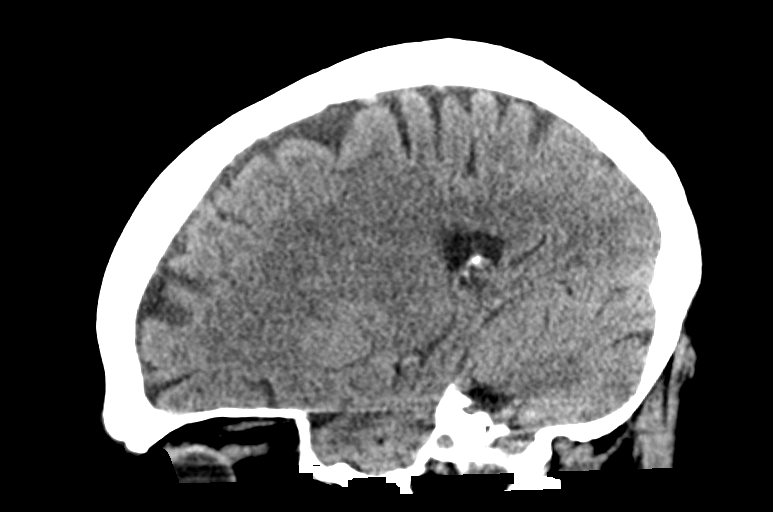
[im 23/46  brain]
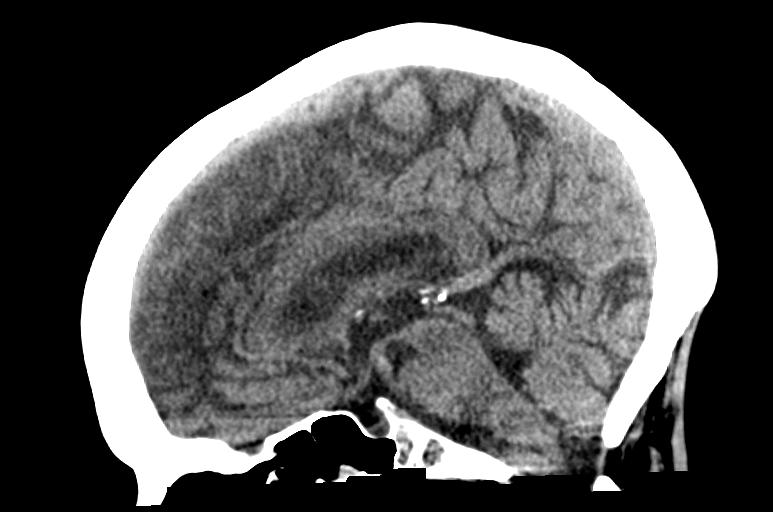
[im 31/46  brain]
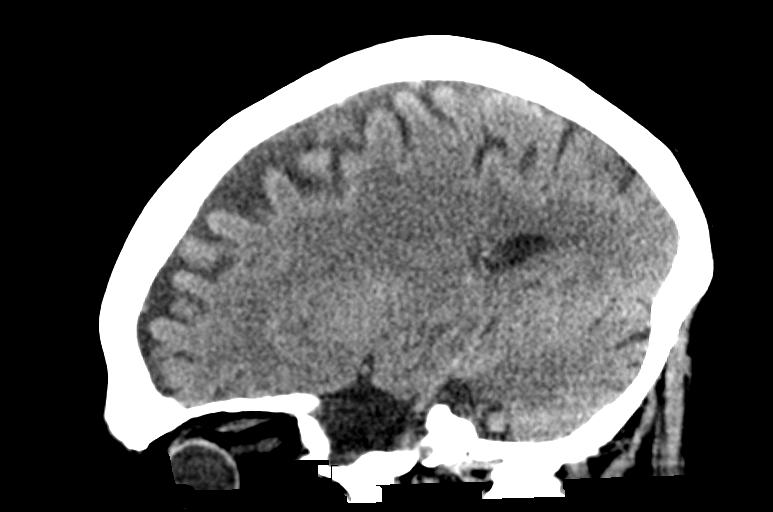

[15 of 46 positions shown; findings below may reference images not displayed]

FINDINGS: Brain: No evidence of acute infarction, hemorrhage, hydrocephalus,
or extra-axial hemorrhage. Stable CSF density structure within the
left middle cranial fossa likely reflecting an arachnoid cyst. Mild
scattered low-density changes within the periventricular and
subcortical white matter compatible with chronic microvascular
ischemic change. Mild diffuse cerebral volume loss.

Vascular: Atherosclerotic calcifications involving the large vessels
of the skull base. No unexpected hyperdense vessel.

Skull: Normal. Negative for fracture or focal lesion.

Sinuses/Orbits: No acute finding.

Other: None.
IMPRESSION: 1. No acute intracranial findings.
2. Chronic microvascular ischemic change and cerebral volume loss.
3. Stable left middle cranial fossa cyst, likely arachnoid cyst.

## 2021-07-03 IMAGING — DX DG CHEST 1V PORT
1 series · 1 of 1 positions shown · non-contrast
Comparison: July 01, 2020

CLINICAL DATA: Syncope

EXAM:
PORTABLE CHEST 1 VIEW

[chest ap]
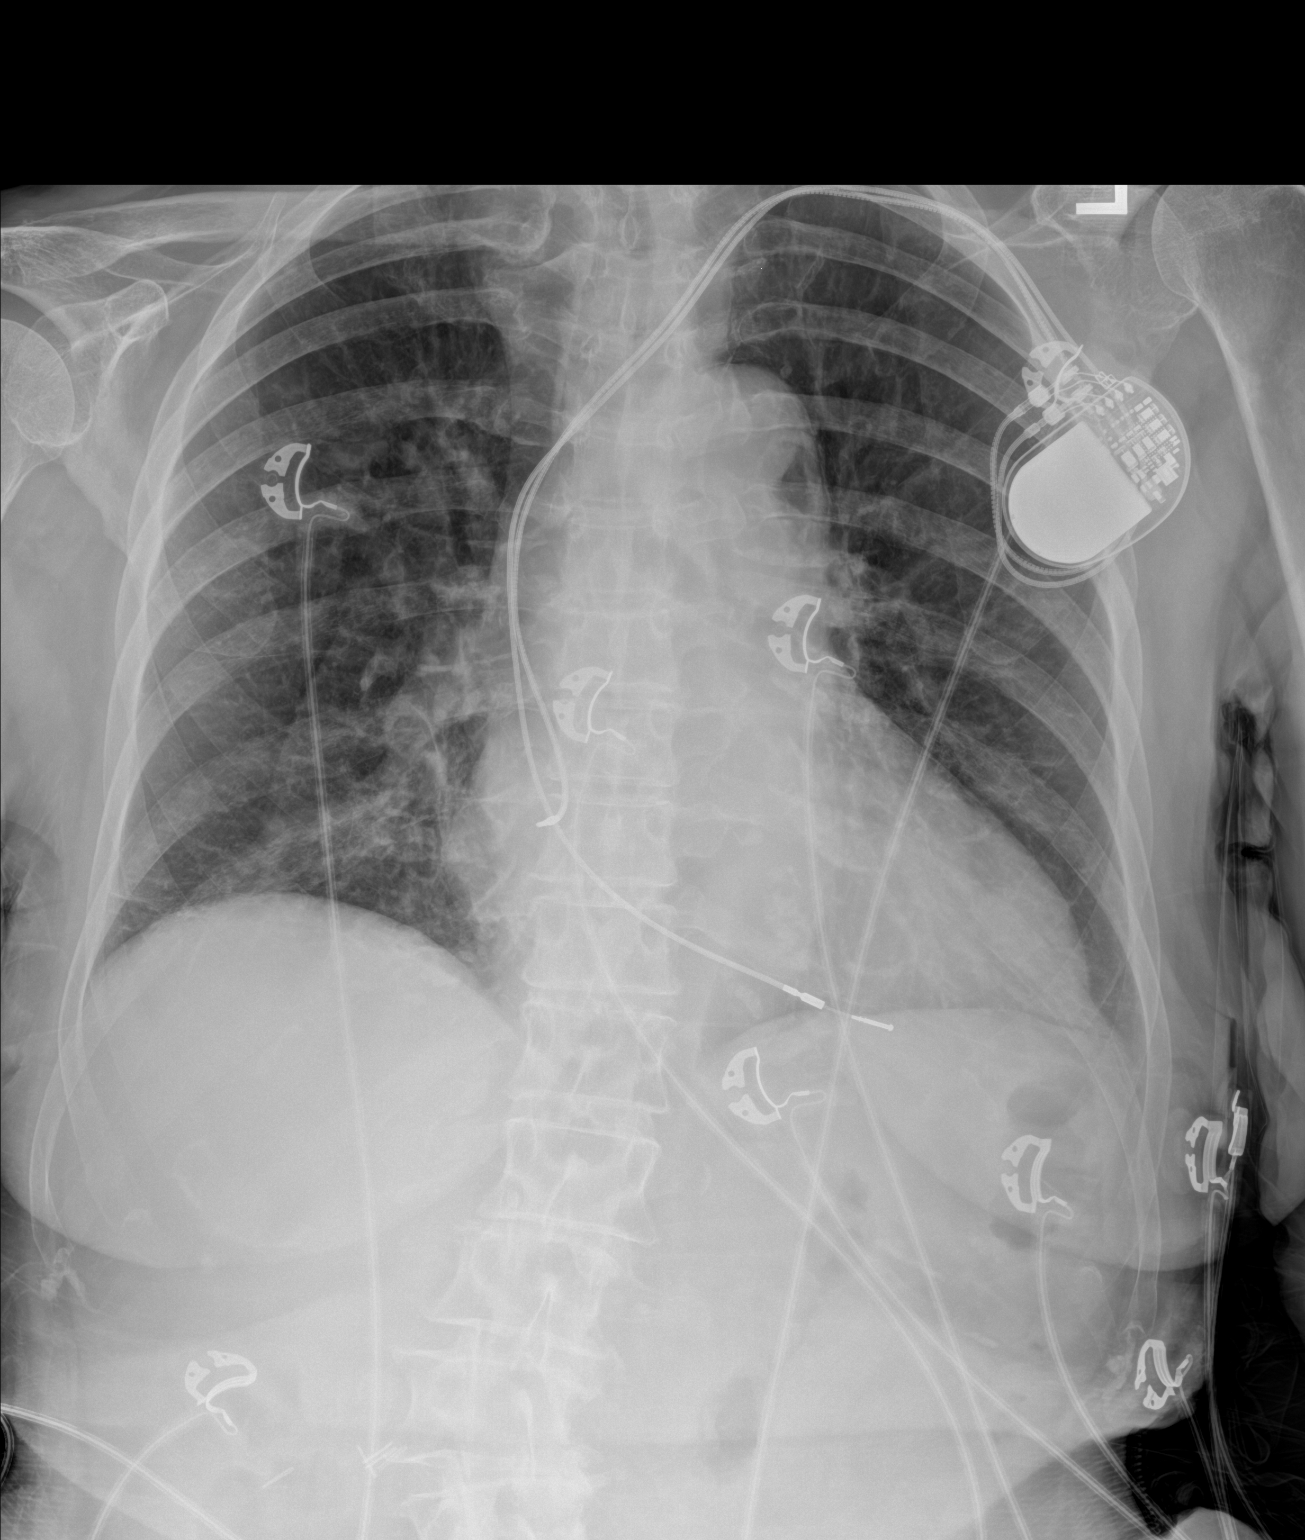

[1 of 1 positions shown; findings below may reference images not displayed]

FINDINGS: There is stable scarring in the right base. Lungs elsewhere are
clear. Heart is upper normal in size with pulmonary vascularity
normal. Pacemaker leads attached to right atrium and right
ventricle. No evident adenopathy. No bone lesions evident.
IMPRESSION: No edema or consolidation. Scarring right base. Stable cardiac
silhouette. Pacemaker leads attached to right atrium and right
ventricle.

## 2021-07-23 LAB — HEPATIC FUNCTION PANEL
ALT: 13 (ref 7–35)
AST: 18 (ref 13–35)
Alkaline Phosphatase: 52 (ref 25–125)
Bilirubin, Total: 0.6

## 2021-07-23 LAB — LIPID PANEL
Cholesterol: 225 — AB (ref 0–200)
HDL: 99 — AB (ref 35–70)
LDL Cholesterol: 106
Triglycerides: 102 (ref 40–160)

## 2021-07-23 LAB — BASIC METABOLIC PANEL
BUN: 15 (ref 4–21)
CO2: 28 — AB (ref 13–22)
Chloride: 102 (ref 99–108)
Creatinine: 0.9 (ref 0.5–1.1)
Glucose: 94
Potassium: 4.3 (ref 3.4–5.3)
Sodium: 139 (ref 137–147)

## 2021-07-23 LAB — CBC AND DIFFERENTIAL
HCT: 43 (ref 36–46)
Hemoglobin: 13.9 (ref 12.0–16.0)
Platelets: 184 (ref 150–399)
WBC: 5.7

## 2021-07-23 LAB — COMPREHENSIVE METABOLIC PANEL
Albumin: 4.2 (ref 3.5–5.0)
Calcium: 10.2 (ref 8.7–10.7)

## 2021-07-23 LAB — CBC: RBC: 4.6 (ref 3.87–5.11)

## 2021-07-24 NOTE — Progress Notes (Signed)
Remote pacemaker transmission.   

## 2021-08-10 ENCOUNTER — Other Ambulatory Visit: Payer: Self-pay

## 2021-08-10 ENCOUNTER — Ambulatory Visit (INDEPENDENT_AMBULATORY_CARE_PROVIDER_SITE_OTHER): Payer: Medicare Other | Admitting: Family Medicine

## 2021-08-10 VITALS — BP 118/98 | HR 91 | Temp 97.3°F | Ht 63.0 in | Wt 140.8 lb

## 2021-08-10 DIAGNOSIS — Z0000 Encounter for general adult medical examination without abnormal findings: Secondary | ICD-10-CM | POA: Diagnosis not present

## 2021-08-10 DIAGNOSIS — Z23 Encounter for immunization: Secondary | ICD-10-CM

## 2021-08-10 NOTE — Progress Notes (Signed)
Subjective:   Regina Hernandez is a 87 y.o. female who presents for Medicare Annual (Subsequent) preventive examination.  Review of Systems    Review of Systems  Constitutional:  Negative for chills and fever.  HENT:  Negative for congestion and sore throat.   Eyes:  Negative for blurred vision and double vision.  Respiratory:  Negative for shortness of breath.   Cardiovascular:  Negative for chest pain.  Gastrointestinal:  Negative for heartburn, nausea and vomiting.  Genitourinary: Negative.   Musculoskeletal: Negative.  Negative for myalgias.  Skin:  Negative for rash.  Neurological:  Negative for dizziness and headaches.  Endo/Heme/Allergies:  Does not bruise/bleed easily.  Psychiatric/Behavioral:  Negative for depression. The patient is not nervous/anxious.    Cardiac Risk Factors include: advanced age (>69mn, >>8women);dyslipidemia;sedentary lifestyle     Objective:    Today's Vitals   08/10/21 1452  BP: (!) 118/98  Pulse: 91  Temp: (!) 97.3 F (36.3 C)  TempSrc: Temporal  SpO2: 99%  Weight: 140 lb 12 oz (63.8 kg)  Height: '5\' 3"'$  (1.6 m)   Body mass index is 24.93 kg/m.  Advanced Directives 08/10/2021 07/22/2020 07/21/2020 07/01/2020 06/22/2020 06/21/2020 06/18/2020  Does Patient Have a Medical Advance Directive? Yes - No Yes Yes Yes No  Type of AParamedicof ARobinson MillLiving will - - Living will;Healthcare Power of [place]Living will HB[place] will [place] Does patient want to make changes to medical advance directive? No - Patient declined - - No - Patient declined No - Patient declined - -  Copy of HCissna Parkin Chart? No - copy requested - - No - copy requested - No - copy requested -  Would patient like information on creating a medical advance directive? - No - Patient declined - No - Patient declined - No - Patient declined No - Patient declined     Current Medications (verified) Outpatient Encounter Medications as of 08/10/2021  Medication Sig   acetaminophen (TYLENOL) 500 MG tablet Take 2 tablets (1,000 mg total) by mouth every 8 (eight) hours as needed for mild pain or moderate pain. (Patient taking differently: Take 500 mg by mouth at bedtime.)   ARIPiprazole (ABILIFY) 2 MG tablet Take 2 mg by mouth every morning.   aspirin EC 81 MG tablet Take 81 mg by mouth daily. Swallow whole.   Cholecalciferol (VITAMIN D3 PO) Take 25 mcg by mouth daily.   diphenhydrAMINE-APAP, sleep, (TYLENOL PM EXTRA STRENGTH PO) Take 500 mg by mouth at bedtime.   docusate sodium (COLACE) 100 MG capsule Take 100 mg by mouth daily.   estradiol (ESTRACE) 0.1 MG/GM vaginal cream Place 1 Applicatorful vaginally as needed.   fluvoxaMINE (LUVOX) 50 MG tablet Take 50 mg by mouth at bedtime.   gabapentin (NEURONTIN) 400 MG capsule Take 1 capsule (400 mg total) by mouth 2 (two) times daily.   memantine (NAMENDA) 10 MG tablet TAKE 1 TABLET BY MOUTH TWICE A DAY   midodrine (PROAMATINE) 5 MG tablet Take 1 tablet (5 mg total) by mouth 3 (three) times daily with meals.   mirabegron ER (MYRBETRIQ) 50 MG TB24 tablet Take 1 tablet (50 mg total) by mouth daily.   primidone (MYSOLINE) 50 MG tablet Take 1 tablet (50 mg total) by mouth at bedtime.   venlafaxine XR (EFFEXOR-XR) 75 MG 24 hr capsule Take 75 mg by mouth 3 (three) times daily.   vitamin B-12 (CYANOCOBALAMIN) 1000 MCG  tablet Take 1,000 mcg by mouth daily.   No facility-administered encounter medications on file as of 08/10/2021.    Allergies (verified) Adhesive [tape], Baclofen, Lyrica [pregabalin], and Sulfa antibiotics   History: Past Medical History:  Diagnosis Date   Anxiety    Cervical dystonia    Essential tremor    GI bleed    Memory loss    Migraine    Occipital neuralgia    Past Surgical History:  Procedure Laterality Date   BOWEL RESECTION     CARDIAC CATHETERIZATION  2016   no  stents/[facility]   ESOPHAGOGASTRODUODENOSCOPY N/A 06/18/2020   Procedure: ESOPHAGOGASTRODUODENOSCOPY (EGD);  Surgeon: Toledo, Benay Pike, MD;  Location: [facility] ENDOSCOPY;  Service: Gastroenterology;  Laterality: N/A;   PACEMAKER IMPLANT     REPLACEMENT TOTAL KNEE Left    Family History  Problem Relation Age of Onset   Other Mother        "old age"   Ulcers Father    Stomach cancer Brother    Social History   Socioeconomic History   Marital status: Widowed    Spouse name: Not on file   Number of children: 5   Years of education: 12th   Highest education level: High school graduate  Occupational History   Occupation: Retired  Tobacco Use   Smoking status: Never   Smokeless tobacco: Never  Vaping Use   Vaping Use: Never used  Substance and Sexual Activity   Alcohol use: Not Currently   Drug use: Never   Sexual activity: Not Currently  Other Topics Concern   Not on file  Social History Narrative   Lives at home with her daughters.   Right-handed.   Two cups caffeine per day.      02/10/21   From: [place] - moved to [facility] [place]   Living: with daughter [name] and Ronny Bacon granddaughter and [name] (daughter)   Work: retired - daycare       Family: Lives with daughters - [name] and [name], 3 other children across the country - too many to count ~15, a few great-grandchildren      Enjoys: play cards, word search books, watch TV, relaxing outside      Exercise: not currently   Diet: good appetite, cauliflower, chicken, likes most things      Safety   Seat belts: Yes    Guns: No   Safe in relationships: Yes    Social Determinants of Radio broadcast assistant Strain: Not on file  Food Insecurity: Not on file  Transportation Needs: Not on file  Physical Activity: Not on file  Stress: Not on file  Social Connections: Not on file    Tobacco Counseling Counseling given: Not Answered   Clinical Intake:  Pre-visit preparation completed: No  Pain :  No/denies pain     BMI - recorded: 24.93 Nutritional Status: BMI of 19-24  Normal Nutritional Risks: None Diabetes: No  How often do you need to have someone help you when you read instructions, pamphlets, or other written materials from your doctor or pharmacy?: 3 - Sometimes What is the last grade level you completed in school?: high school  Diabetic?no  Interpreter Needed?: No      Activities of Daily Living In your present state of health, do you have any difficulty performing the following activities: 08/10/2021  Hearing? N  Vision? N  Difficulty concentrating or making decisions? Y  Comment has support, follows with neuro  Walking or climbing stairs? Darreld Mclean  Dressing or bathing? N  Doing errands, shopping? Y  Comment gets help  Preparing Food and eating ? N  Using the Toilet? N  In the past six months, have you accidently leaked urine? N  Do you have problems with loss of bowel control? N  Managing your Medications? Y  Managing your Finances? Y  Housekeeping or managing your Housekeeping? Y  Some recent data might be hidden    Patient Care Team: Lesleigh Noe, MD as PCP - General (Family Medicine) Marcial Pacas, MD as Consulting Physician (Neurology) Kate Sable, MD as Consulting Physician (Cardiology) Festus Aloe, MD as Consulting Physician (Urology) Norma Fredrickson, MD as Consulting Physician (Psychiatry)  Indicate any recent Medical Services you may have received from other than [facility] providers in the past year (date may be approximate).     Assessment:   This is a routine wellness examination for Regina Hernandez.  Hearing/Vision screen Hearing Screening - Comments:: Pt wears hearing aides  Vision Screening - Comments:: Last eye exam at [facility] center, in December 2021   Dietary issues and exercise activities discussed: Current Exercise Habits: Home exercise routine, Type of exercise: strength training/weights, Time (Minutes): 15,  Frequency (Times/Week): 3, Weekly Exercise (Minutes/Week): 45, Intensity: Mild, Exercise limited by: orthopedic condition(s);neurologic condition(s)   Goals Addressed             This Visit's Progress    Exercise 3x per week       Consider increasing exercise to 4 days a week      Depression Screen PHQ 2/9 Scores 08/10/2021 02/10/2021 06/27/2020 02/11/2020  PHQ - 2 Score 0 0 0 0  PHQ- 9 Score - - 1 -    Fall Risk Fall Risk  08/10/2021 06/27/2020 02/11/2020  Falls in the past year? 1 0 0  Number falls in past yr: 0 0 -  Injury with Fall? 0 0 -    FALL RISK PREVENTION PERTAINING TO THE HOME:  Any stairs in or around the home? No  If so, are there any without handrails? Yes  Home free of loose throw rugs in walkways, pet beds, electrical cords, etc? Yes  Adequate lighting in your home to reduce risk of falls? Yes   ASSISTIVE DEVICES UTILIZED TO PREVENT FALLS:  Life alert? No  Use of a cane, walker or w/c? Yes  Grab bars in the bathroom? Yes  Shower chair or bench in shower? Yes  Elevated toilet seat or a handicapped toilet? Yes     Gait steady and fast with assistive device  Cognitive Function: MMSE - Mini Mental State Exam 04/22/2021 10/22/2020 02/04/2020  Orientation to time '4 5 4  '$ Orientation to Place '5 5 4  '$ Registration 0 3 3  Attention/ Calculation 1 0 5  Recall '3 3 2  '$ Language- name 2 objects '2 2 2  '$ Language- repeat '1 1 1  '$ Language- follow 3 step command '3 3 3  '$ Language- read & follow direction '1 1 1  '$ Write a sentence '1 1 1  '$ Copy design '1 1 1  '$ Total score '22 25 27        '$ Immunizations Immunization History  Administered Date(s) Administered   Fluad Quad(high Dose 65+) 09/10/2020   Influenza Split 09/26/2012   Influenza, High Dose Seasonal PF 10/15/2011, 10/16/2013, 10/22/2014, 12/01/2015, 11/30/2016, 12/01/2017, 09/23/2018   Influenza,inj,Quad PF,6+ Mos 08/28/2019   Influenza-Unspecified 09/26/2012, 11/30/2016, 12/01/2017, 12/12/2017   Moderna SARS-COV2  Booster Vaccination 03/28/2021   Moderna Sars-Covid-2 Vaccination 01/08/2020, 02/05/2020, 10/18/2020  PNEUMOCOCCAL CONJUGATE-20 08/10/2021   Pneumococcal Polysaccharide-23 09/26/2009   Tdap 09/28/2017    TDAP status: Up to date  Flu Vaccine status: Up to date  Pneumococcal vaccine status: Up to date  Covid-19 vaccine status: Completed vaccines  Qualifies for Shingles Vaccine? No   Zostavax completed No   Shingrix Completed?: No.    Education has been provided regarding the importance of this vaccine. Patient has been advised to call insurance company to determine out of pocket expense if they have not yet received this vaccine. Advised may also receive vaccine at local pharmacy or Health Dept. Verbalized acceptance and understanding.  Screening Tests Health Maintenance  Topic Date Due   Zoster Vaccines- Shingrix (1 of 2) Never done   DEXA SCAN  Never done   INFLUENZA VACCINE  07/27/2021   PNA vac Low Risk Adult (2 of 2 - PCV13) 08/10/2022   TETANUS/TDAP  09/29/2027   COVID-19 Vaccine  Completed   HPV VACCINES  Aged Out    Health Maintenance  Health Maintenance Due  Topic Date Due   Zoster Vaccines- Shingrix (1 of 2) Never done   DEXA SCAN  Never done   INFLUENZA VACCINE  07/27/2021    Colorectal cancer screening: No longer required.   Mammogram status: No longer required due to age.  Declines Dexa  Lung Cancer Screening: (Low Dose CT Chest recommended if Age 14-80 years, 30 pack-year currently smoking OR have quit w/in 15years.) does not qualify.   Lung Cancer Screening Referral: n/a  Additional Screening:  Hepatitis C Screening: does qualify; Completed unknown  Vision Screening: Recommended annual ophthalmology exams for early detection of glaucoma and other disorders of the eye. Is the patient up to date with their annual eye exam?  Yes  Who is the provider or what is the name of the office in which the patient attends annual eye exams? [facility]  in [place] If pt is not established with a provider, would they like to be referred to a provider to establish care? No .   Dental Screening: Recommended annual dental exams for proper oral hygiene  Community Resource Referral / Chronic Care Management: CRR required this visit?  No   CCM required this visit?  No      Plan:     I have personally reviewed and noted the following in the patient's chart:   Medical and social history Use of alcohol, tobacco or illicit drugs  Current medications and supplements including opioid prescriptions.  Functional ability and status Nutritional status Physical activity Advanced directives List of other physicians Hospitalizations, surgeries, and ER visits in previous 12 months Vitals Screenings to include cognitive, depression, and falls Referrals and appointments  In addition, I have reviewed and discussed with patient certain preventive protocols, quality metrics, and best practice recommendations. A written personalized care plan for preventive services as well as general preventive health recommendations were provided to patient.     Lesleigh Noe, MD   08/10/2021

## 2021-08-10 NOTE — Patient Instructions (Signed)
Check with pharmacy on the Shingles Vaccine  Pneumonia shot today

## 2021-08-12 ENCOUNTER — Encounter: Payer: Self-pay | Admitting: Psychiatry

## 2021-09-17 ENCOUNTER — Other Ambulatory Visit: Payer: Self-pay

## 2021-09-17 MED ORDER — MIDODRINE HCL 5 MG PO TABS: 5.0000 mg | ORAL_TABLET | Freq: Three times a day (TID) | ORAL | 5 refills | Status: DC

## 2021-10-01 ENCOUNTER — Ambulatory Visit (INDEPENDENT_AMBULATORY_CARE_PROVIDER_SITE_OTHER): Payer: Medicare Other

## 2021-10-01 DIAGNOSIS — I495 Sick sinus syndrome: Secondary | ICD-10-CM

## 2021-10-01 LAB — CUP PACEART REMOTE DEVICE CHECK
Battery Impedance: 1694 Ohm
Battery Remaining Longevity: 52 mo
Battery Voltage: 2.75 V
Brady Statistic AP VP Percent: 0 %
Brady Statistic AP VS Percent: 6 %
Brady Statistic AS VP Percent: 0 %
Brady Statistic AS VS Percent: 93 %
Date Time Interrogation Session: 20221006065418
Implantable Lead Implant Date: 20130122
Implantable Lead Implant Date: 20130122
Implantable Lead Location: 753859
Implantable Lead Location: 753860
Implantable Lead Model: 4092
Implantable Lead Model: 5076
Implantable Pulse Generator Implant Date: 20130122
Lead Channel Impedance Value: 359 Ohm
Lead Channel Impedance Value: 600 Ohm
Lead Channel Pacing Threshold Amplitude: 0.5 V
Lead Channel Pacing Threshold Amplitude: 0.5 V
Lead Channel Pacing Threshold Pulse Width: 0.4 ms
Lead Channel Pacing Threshold Pulse Width: 0.4 ms
Lead Channel Setting Pacing Amplitude: 1.5 V
Lead Channel Setting Pacing Amplitude: 2 V
Lead Channel Setting Pacing Pulse Width: 0.4 ms
Lead Channel Setting Sensing Sensitivity: 5.6 mV

## 2021-10-09 NOTE — Progress Notes (Signed)
Remote pacemaker transmission.   

## 2021-10-21 ENCOUNTER — Other Ambulatory Visit: Payer: Self-pay | Admitting: Neurology

## 2021-10-27 ENCOUNTER — Ambulatory Visit (INDEPENDENT_AMBULATORY_CARE_PROVIDER_SITE_OTHER): Payer: Medicare Other | Admitting: Neurology

## 2021-10-27 ENCOUNTER — Other Ambulatory Visit: Payer: Self-pay

## 2021-10-27 ENCOUNTER — Encounter: Payer: Self-pay | Admitting: Neurology

## 2021-10-27 VITALS — BP 154/107 | HR 91 | Ht 63.0 in | Wt 145.0 lb

## 2021-10-27 DIAGNOSIS — R519 Headache, unspecified: Secondary | ICD-10-CM

## 2021-10-27 DIAGNOSIS — G250 Essential tremor: Secondary | ICD-10-CM

## 2021-10-27 DIAGNOSIS — G3184 Mild cognitive impairment, so stated: Secondary | ICD-10-CM

## 2021-10-27 MED ORDER — GABAPENTIN 400 MG PO CAPS: 400.0000 mg | ORAL_CAPSULE | Freq: Two times a day (BID) | ORAL | 3 refills | Status: DC

## 2021-10-27 MED ORDER — MEMANTINE HCL 10 MG PO TABS: 10.0000 mg | ORAL_TABLET | Freq: Two times a day (BID) | ORAL | 3 refills | Status: DC

## 2021-10-27 NOTE — Progress Notes (Signed)
PATIENT: Regina Hernandez DOB: [birth year]  REASON FOR VISIT: follow up HISTORY FROM: patient  HISTORY OF PRESENT ILLNESS: Today 10/27/21  HISTORY Regina Hernandez is a 87 year old female, seen in request by her primary care physician Alma Friendly, accompanied by her daughter [name] for evaluation of constellation of complaints, try to establish neurology care here on February 04, 2020.   I have reviewed and summarized the referring note from the referring physician.  She had past medical history of pacemaker placement.   I was able to review extensive previous records from [facility] neurology by Doctor Lawrence Marseilles, Brooke Bonito, most recent visit was on June 13, 2018, she was seen for occipital neuralgia, migraine, memory loss, cervical dystonia, also tremor, likely to be essential tremor, had occipital nerve block in the past, which was not helpful, started on primidone 50 mg every night, increase to 2 tablets every night, was offered Vicodin 5 325 mg 6 tablets for occipital neuralgia   Patient had longstanding history of depression, lost her husband in 2019, in December 2020, she overdosed herself on 1 bottle of Ambien, was admitted at [facility],   She now lives with her 2 daughters, the most bothersome symptoms for her is memory loss, mildly unsteady gait.   She retired from daycare job, used to enjoying working the yard, was noted to have gradual onset of memory loss since 2019, getting worse since December 2020, she forgets dates, has difficulty operating TV remote control, microwave, and other household appliances, she hopes to receive treatment for her migraine,   She is on polypharmacy treatment, including primidone 50 mg 3 tablets every night for tremor, Topamax 50 mg 3 times a day for migraine headaches, gabapentin 400 mg 2 tablets 3 times a day for chronic pain, and also Risperdal 2 mg at bedtime, Effexor 75 mg daily, fluvoxamine 50 mg twice a day,   She has a long history of migraine  headaches, overall has been improved,  UPDATE Oct 27 2021: She is accompanied by her daughter at today's visit,, personally reviewed CT head without contrast in July 2021, no acute abnormality, chronic microvascular ischemic change and mild generalized atrophy, stable left middle cranial fossa arachnoid cyst,  Her headache overall has much improved, on gabapentin 400 mg twice a day, may take gabapentin once a day, later as needed, instead of regular basis  She continue has mild bilateral hand essential tremor, no major limitation on her daily function, most noticeable when she is using utensils, this was a decrease from 150 mg to current 50 mg, after discussed with patient, will taper off primidone to avoid polypharmacy side effect  She is already on polypharmacy treatment for her long history of psychiatric disease, depression, currently on Effexor 75 mg 3 tablets daily, Luvox 50 mg, Abilify 2 mg every morning  She has mild cognitive impairment, overall stable   Laboratory evaluation TSH, RPR, B12, CBC, CMP were unremarkable  EEG in February 2021 showed mild background slowing,  REVIEW OF SYSTEMS: Out of a complete 14 system review of symptoms, the patient complains only of the following symptoms, and all other reviewed systems are negative.  Tremor, memory loss  ALLERGIES: Allergies  Allergen Reactions   Adhesive [Tape] Other (See Comments)    Contact dermatitis   Baclofen Rash   Lyrica [Pregabalin] Other (See Comments)    Unable to remember reaction.   Sulfa Antibiotics Rash    HOME MEDICATIONS: Outpatient Medications Prior to Visit  Medication Sig Dispense Refill  acetaminophen (TYLENOL) 500 MG tablet Take 2 tablets (1,000 mg total) by mouth every 8 (eight) hours as needed for mild pain or moderate pain. (Patient taking differently: Take 500 mg by mouth at bedtime.) 30 tablet 0   ARIPiprazole (ABILIFY) 2 MG tablet Take 2 mg by mouth every morning.     aspirin EC 81 MG tablet  Take 81 mg by mouth daily. Swallow whole.     Cholecalciferol (VITAMIN D3 PO) Take 25 mcg by mouth daily.     diphenhydrAMINE-APAP, sleep, (TYLENOL PM EXTRA STRENGTH PO) Take 500 mg by mouth at bedtime.     docusate sodium (COLACE) 100 MG capsule Take 100 mg by mouth daily.     estradiol (ESTRACE) 0.1 MG/GM vaginal cream Place 1 Applicatorful vaginally as needed.     fluvoxaMINE (LUVOX) 50 MG tablet Take 50 mg by mouth at bedtime.     gabapentin (NEURONTIN) 400 MG capsule TAKE 1 CAPSULE BY MOUTH 2 TIMES DAILY. 60 capsule 5   memantine (NAMENDA) 10 MG tablet TAKE 1 TABLET BY MOUTH TWICE A DAY 180 tablet 1   midodrine (PROAMATINE) 5 MG tablet Take 1 tablet (5 mg total) by mouth 3 (three) times daily with meals. 90 tablet 5   mirabegron ER (MYRBETRIQ) 50 MG TB24 tablet Take 1 tablet (50 mg total) by mouth daily. 30 tablet 11   primidone (MYSOLINE) 50 MG tablet Take 1 tablet (50 mg total) by mouth at bedtime. 90 tablet 1   venlafaxine XR (EFFEXOR-XR) 75 MG 24 hr capsule Take 75 mg by mouth 3 (three) times daily.     vitamin B-12 (CYANOCOBALAMIN) 1000 MCG tablet Take 1,000 mcg by mouth daily.     No facility-administered medications prior to visit.    PAST MEDICAL HISTORY: Past Medical History:  Diagnosis Date   Anxiety    Cervical dystonia    Essential tremor    GI bleed    Memory loss    Migraine    Occipital neuralgia     PAST SURGICAL HISTORY: Past Surgical History:  Procedure Laterality Date   BOWEL RESECTION     CARDIAC CATHETERIZATION  2016   no stents/[facility]   ESOPHAGOGASTRODUODENOSCOPY N/A 06/18/2020   Procedure: ESOPHAGOGASTRODUODENOSCOPY (EGD);  Surgeon: Toledo, Benay Pike, MD;  Location: [facility] ENDOSCOPY;  Service: Gastroenterology;  Laterality: N/A;   PACEMAKER IMPLANT     REPLACEMENT TOTAL KNEE Left     FAMILY HISTORY: Family History  Problem Relation Age of Onset   Other Mother        "old age"   Ulcers Father    Stomach cancer Brother      SOCIAL HISTORY: Social History   Socioeconomic History   Marital status: Widowed    Spouse name: Not on file   Number of children: 5   Years of education: 12th   Highest education level: High school graduate  Occupational History   Occupation: Retired  Tobacco Use   Smoking status: Never   Smokeless tobacco: Never  Vaping Use   Vaping Use: Never used  Substance and Sexual Activity   Alcohol use: Not Currently   Drug use: Never   Sexual activity: Not Currently  Other Topics Concern   Not on file  Social History Narrative   Lives at home with her daughters.   Right-handed.   Two cups caffeine per day.      02/10/21   From: [place] - moved to [facility] [place]   Living: with daughter [name] and Ronny Bacon  granddaughter and [name] (daughter)   Work: retired - daycare       Family: Lives with daughters - [name] and [name], 3 other children across the country - too many to count ~15, a few great-grandchildren      Enjoys: play cards, word search books, watch TV, relaxing outside      Exercise: not currently   Diet: good appetite, cauliflower, chicken, likes most things      Safety   Seat belts: Yes    Guns: No   Safe in relationships: Yes    Social Determinants of Radio broadcast assistant Strain: Not on file  Food Insecurity: Not on file  Transportation Needs: Not on file  Physical Activity: Not on file  Stress: Not on file  Social Connections: Not on file  Intimate Partner Violence: Not on file   PHYSICAL EXAM  Vitals:   10/27/21 0939  BP: (!) 154/107  Pulse: 91  Weight: 145 lb (65.8 kg)  Height: 5\' 3"  (1.6 m)   Body mass index is 25.69 kg/m.  Generalized: Well developed, in no acute distress  MMSE - Mini Mental State Exam 04/22/2021 10/22/2020 02/04/2020  Orientation to time 4 5 4   Orientation to Place 5 5 4   Registration 0 3 3  Attention/ Calculation 1 0 5  Recall 3 3 2   Language- name 2 objects 2 2 2   Language- repeat 1 1 1   Language- follow 3 step command  3 3 3   Language- read & follow direction 1 1 1   Write a sentence 1 1 1   Copy design 1 1 1   Total score 22 25 27     PHYSICAL EXAMNIATION:  Gen: NAD, conversant, well nourised, well groomed                     Cardiovascular: Regular rate rhythm, no peripheral edema, warm, nontender. Eyes: Conjunctivae clear without exudates or hemorrhage Neck: Supple, no carotid bruits. Pulmonary: Clear to auscultation bilaterally   NEUROLOGICAL EXAM:  MENTAL STATUS: Speech/Cognition: Awake, alert, normal speech, oriented to history taking and casual conversation.  CRANIAL NERVES: CN II: Visual fields are full to confrontation.  Pupils are round equal and briskly reactive to light. CN III, IV, VI: extraocular movement are normal. No ptosis. CN V: Facial sensation is intact to light touch. CN VII: Face is symmetric with normal eye closure and smile. CN VIII: Hearing is normal to casual conversation CN IX, X: Palate elevates symmetrically. Phonation is normal. CN XI: Head turning and shoulder shrug are intact   MOTOR: Mild bilateral hand posturing tremor, normal strength, no rigidity no bradykinesia  REFLEXES: Reflexes are 2  and symmetric at the biceps, triceps, knees and ankles. Plantar responses are flexor.  SENSORY: Intact to light touch,    COORDINATION: There is no trunk or limb ataxia.    GAIT/STANCE: Need to push-up to get up from seated position, steady,  DIAGNOSTIC DATA (LABS, IMAGING, TESTING) - I reviewed patient records, labs, notes, testing and imaging myself where available.  Lab Results  Component Value Date   WBC 5.7 07/23/2021   HGB 13.9 07/23/2021   HCT 43 07/23/2021   MCV 93.3 04/07/2021   PLT 184 07/23/2021      Component Value Date/Time   NA 139 07/23/2021 0000   K 4.3 07/23/2021 0000   CL 102 07/23/2021 0000   CO2 28 (A) 07/23/2021 0000   GLUCOSE 79 04/13/2021 0836   BUN 15 07/23/2021 0000   CREATININE  0.9 07/23/2021 0000   CREATININE 0.92 04/13/2021  0836   CALCIUM 10.2 07/23/2021 0000   PROT 6.8 07/21/2020 1056   PROT 6.8 02/04/2020 1439   ALBUMIN 4.2 07/23/2021 0000   ALBUMIN 4.3 02/04/2020 1439   AST 18 07/23/2021 0000   ALT 13 07/23/2021 0000   ALKPHOS 52 07/23/2021 0000   BILITOT 1.0 07/21/2020 1056   BILITOT 0.2 02/04/2020 1439   GFRNONAA >60 04/13/2021 0836   GFRAA >60 07/23/2020 0415   Lab Results  Component Value Date   CHOL 225 (A) 07/23/2021   HDL 99 (A) 07/23/2021   LDLCALC 106 07/23/2021   TRIG 102 07/23/2021   No results found for: HGBA1C Lab Results  Component Value Date   VITAMINB12 475 07/01/2020   Lab Results  Component Value Date   TSH 2.548 06/22/2020   ASSESSMENT AND PLAN 87 y.o. year old   Mild cognitive impairment -Stable, MMSE was 22/30 today -Not a candidate for MRI due to pacemaker -Continue Namenda 10 mg twice a day -Laboratory evaluation TSH, RPR, B12, CBC, CMP were unremarkable -CT scan showed no acute abnormality, evidence of mild generalized atrophy, supratentorium small vessel disease, left anterior temporal fossa arachnoid cyst, which is chronic and benign finding -EEG showed mild background slowing Continue moderate exercise,  Essential tremor Tremor is stable, minimal, no limitation in her daily function After discussed with patient, decided to taper her off primidone  Depression, anxiety, polypharmacy treatment -Seeing psychiatry, on Abilify, Luvox, Effexor  Occipital neuralgia/neuropathy Has much improved, on gabapentin 400 mg twice a day, I have suggested her to taper down to daily, and as needed if her headache continues to be under good control  Will continue follow-up with her primary care, only return to clinic for new issues  Marcial Pacas, M.D. Ph.D.  [facility] [place], [place] [address] Phone: [phone] Fax:      [phone]

## 2021-11-25 ENCOUNTER — Other Ambulatory Visit: Payer: Self-pay | Admitting: Neurology

## 2021-12-24 ENCOUNTER — Telehealth: Payer: Medicare Other | Admitting: Physician Assistant

## 2021-12-24 DIAGNOSIS — U071 COVID-19: Secondary | ICD-10-CM

## 2021-12-24 NOTE — Progress Notes (Signed)
Based on the information that you have shared in the e-Visit Questionnaire, we recommend that you convert this visit to a video visit in order for the provider to better assess what is going on.  The provider will be able to give you a more accurate diagnosis and treatment plan if we can more freely discuss your symptoms and with the addition of a virtual examination.   If you desire antiviral treatment, we do require a video visit for this.   If you convert to a video visit, we will bill your insurance (similar to an office visit) and you will not be charged for this e-Visit. You will be able to stay at home and speak with the first available [facility] advanced practice provider. The link to do a video visit is in the drop down Menu tab of your Welcome screen in [place].

## 2021-12-25 ENCOUNTER — Ambulatory Visit (INDEPENDENT_AMBULATORY_CARE_PROVIDER_SITE_OTHER): Payer: Medicare Other | Admitting: Family Medicine

## 2021-12-25 ENCOUNTER — Other Ambulatory Visit (INDEPENDENT_AMBULATORY_CARE_PROVIDER_SITE_OTHER): Payer: Medicare Other

## 2021-12-25 ENCOUNTER — Encounter: Payer: Self-pay | Admitting: Family Medicine

## 2021-12-25 ENCOUNTER — Other Ambulatory Visit: Payer: Self-pay | Admitting: Family Medicine

## 2021-12-25 ENCOUNTER — Telehealth: Payer: Self-pay | Admitting: Family Medicine

## 2021-12-25 ENCOUNTER — Other Ambulatory Visit: Payer: Self-pay

## 2021-12-25 DIAGNOSIS — U071 COVID-19: Secondary | ICD-10-CM

## 2021-12-25 LAB — BASIC METABOLIC PANEL
BUN: 21 mg/dL (ref 6–23)
CO2: 30 mEq/L (ref 19–32)
Calcium: 9.5 mg/dL (ref 8.4–10.5)
Chloride: 100 mEq/L (ref 96–112)
Creatinine, Ser: 0.92 mg/dL (ref 0.40–1.20)
GFR: 56.67 mL/min — ABNORMAL LOW (ref >60.00)
Glucose, Bld: 88 mg/dL (ref 70–99)
Potassium: 4.8 mEq/L (ref 3.5–5.1)
Sodium: 134 mEq/L — ABNORMAL LOW (ref 135–145)

## 2021-12-25 MED ORDER — NIRMATRELVIR/RITONAVIR (PAXLOVID) TABLET (RENAL DOSING): 2.0000 | ORAL_TABLET | Freq: Two times a day (BID) | ORAL | 0 refills | Status: AC

## 2021-12-25 MED ORDER — NIRMATRELVIR/RITONAVIR (PAXLOVID)TABLET: 3.0000 | ORAL_TABLET | Freq: Two times a day (BID) | ORAL | 0 refills | Status: DC

## 2021-12-25 NOTE — Telephone Encounter (Signed)
I sent paxlovid to her pharmacy to start now  It can cause a little dizziness and loose stool  Let us know if any problems   Please check in with her on Tuesday

## 2021-12-25 NOTE — Telephone Encounter (Signed)
Pt notified Rx sent to pharmacy and advised of Dr. Tower's comments  

## 2021-12-25 NOTE — Assessment & Plan Note (Signed)
Day 3 with fever and cough and congestion/head ache  Given age/risk factors discussed option of Paxlovid antiviral and she would like to be treated Set up stat labs for bmet (GFR) before px  Disc poss side effects to watch for  Recommend fluids and rest Symptom control, delsym and try tylenol for fever prn  Watch closely for wheeze or sob Update if not starting to improve in a week or if worsening  ER precautions reviewed

## 2021-12-25 NOTE — Progress Notes (Signed)
Virtual Visit via Telephone Note  I connected with Regina Hernandez on 12/25/21 at 11:30 AM EST by telephone and verified that I am speaking with the correct person using two identifiers.  Location: Patient: home Provider: office   I discussed the limitations, risks, security and privacy concerns of performing an evaluation and management service by telephone and the availability of in person appointments. I also discussed with the patient that there may be a patient responsible charge related to this service. The patient expressed understanding and agreed to proceed.  Parties involved in encounter  Patient: Regina Hernandez   Provider:  Loura Pardon MD   History of Present Illness: 87 yo pt of Dr Einar Pheasant presents with c/o covid 19   Started symptoms since Tuesday night   positive covid test yesterday Did an E visit but they cannot px antivirals  Coughing badly worse at night  Scant phlegm but mostly dry  No wheeze  Not sob   Feels weak all over so uses a walker  Not getting any sleep Headache - worse with cough  Congested   Has had a fever/101  Feels chills  Very achey    Lab Results  Component Value Date   CREATININE 0.9 07/23/2021   BUN 15 07/23/2021   NA 139 07/23/2021   K 4.3 07/23/2021   CL 102 07/23/2021   CO2 28 (A) 07/23/2021    Otc:  Delsym  Has tylenol-has not taken  Fluids   Patient Active Problem List   Diagnosis Date Noted   COVID-19 12/25/2021   Mild cognitive impairment 10/27/2021   Nonintractable headache 10/27/2021   History of stomach ulcers 08/01/2020   Diarrhea 07/21/2020   Ventricular tachycardia 06/22/2020   Syncopal episodes 06/21/2020   Orthostatic hypotension 06/21/2020   Melena 06/17/2020   Essential tremor    Arachnoid cyst 02/11/2020   Chronic headaches 02/11/2020   Coccydynia 02/11/2020   H/O staphylococcal infection 02/11/2020   Migraine 02/11/2020   Vertigo 02/11/2020   Mild cognitive impairment with memory loss 02/04/2020    Gait abnormality 02/04/2020   Depression 02/04/2020   Major depression with psychotic features ([place]) 10/31/2019   History of suicide attempt 10/30/2019   Iron deficiency anemia 05/22/2019   Status post left knee replacement 11/20/2018   Pain of cervical facet joint 07/13/2018   Arthritis 06/01/2018   Tremor 06/01/2018   Spondylosis without myelopathy or radiculopathy, cervical region 01/19/2017   Diverticulosis of large intestine without hemorrhage 11/30/2016   Stage 3 chronic kidney disease ([place]) 11/30/2016   Plantar fasciitis of left foot 09/29/2016   Left knee pain 11/26/2014   Polyp of colon 11/01/2013   Hypertension 03/01/2012   Pacemaker 01/26/2012   History of endocarditis 09/15/2011   Coronary atherosclerosis 08/17/2011   Sick sinus syndrome ([place]) 08/17/2011   Barrett's esophagus without dysplasia 07/20/2010   Microscopic hematuria 07/20/2010   Recurrent UTI 07/20/2010   Vestibulitis, vulvar 07/20/2010   Past Medical History:  Diagnosis Date   Anxiety    Cervical dystonia    Essential tremor    GI bleed    Memory loss    Migraine    Occipital neuralgia    Past Surgical History:  Procedure Laterality Date   BOWEL RESECTION     CARDIAC CATHETERIZATION  2016   no stents/[facility]   ESOPHAGOGASTRODUODENOSCOPY N/A 06/18/2020   Procedure: ESOPHAGOGASTRODUODENOSCOPY (EGD);  Surgeon: Toledo, Benay Pike, MD;  Location: [facility] ENDOSCOPY;  Service: Gastroenterology;  Laterality: N/A;   PACEMAKER IMPLANT  REPLACEMENT TOTAL KNEE Left    Social History   Tobacco Use   Smoking status: Never   Smokeless tobacco: Never  Vaping Use   Vaping Use: Never used  Substance Use Topics   Alcohol use: Not Currently   Drug use: Never   Family History  Problem Relation Age of Onset   Other Mother        "old age"   Ulcers Father    Stomach cancer Brother    Allergies  Allergen Reactions   Adhesive [Tape] Other (See Comments)    Contact dermatitis    Baclofen Rash   Lyrica [Pregabalin] Other (See Comments)    Unable to remember reaction.   Sulfa Antibiotics Rash   Current Outpatient Medications on File Prior to Visit  Medication Sig Dispense Refill   acetaminophen (TYLENOL) 500 MG tablet Take 2 tablets (1,000 mg total) by mouth every 8 (eight) hours as needed for mild pain or moderate pain. (Patient taking differently: Take 500 mg by mouth at bedtime.) 30 tablet 0   ARIPiprazole (ABILIFY) 2 MG tablet Take 2 mg by mouth every morning.     aspirin EC 81 MG tablet Take 81 mg by mouth daily. Swallow whole.     Cholecalciferol (VITAMIN D3 PO) Take 25 mcg by mouth daily.     diphenhydrAMINE-APAP, sleep, (TYLENOL PM EXTRA STRENGTH PO) Take 500 mg by mouth at bedtime.     docusate sodium (COLACE) 100 MG capsule Take 100 mg by mouth daily.     estradiol (ESTRACE) 0.1 MG/GM vaginal cream Place 1 Applicatorful vaginally as needed.     fluvoxaMINE (LUVOX) 50 MG tablet Take 50 mg by mouth at bedtime.     gabapentin (NEURONTIN) 400 MG capsule Take 1 capsule (400 mg total) by mouth 2 (two) times daily. 180 capsule 3   memantine (NAMENDA) 10 MG tablet Take 1 tablet (10 mg total) by mouth 2 (two) times daily. 180 tablet 3   midodrine (PROAMATINE) 5 MG tablet Take 1 tablet (5 mg total) by mouth 3 (three) times daily with meals. 90 tablet 5   mirabegron ER (MYRBETRIQ) 50 MG TB24 tablet Take 1 tablet (50 mg total) by mouth daily. 30 tablet 11   venlafaxine XR (EFFEXOR-XR) 75 MG 24 hr capsule Take 75 mg by mouth 3 (three) times daily.     vitamin B-12 (CYANOCOBALAMIN) 1000 MCG tablet Take 1,000 mcg by mouth daily.     No current facility-administered medications on file prior to visit.   Review of Systems  Constitutional:  Positive for chills, fever and malaise/fatigue.  HENT:  Positive for congestion and sore throat. Negative for ear pain and sinus pain.   Eyes:  Negative for blurred vision, discharge and redness.  Respiratory:  Positive for cough.  Negative for sputum production, shortness of breath, wheezing and stridor.   Cardiovascular:  Negative for chest pain, palpitations and leg swelling.  Gastrointestinal:  Negative for abdominal pain, diarrhea, nausea and vomiting.  Musculoskeletal:  Negative for myalgias.  Skin:  Negative for rash.  Neurological:  Positive for headaches. Negative for dizziness.    Observations/Objective:   Assessment and Plan: Problem List Items Addressed This Visit       Other   COVID-19    Day 3 with fever and cough and congestion/head ache  Given age/risk factors discussed option of Paxlovid antiviral and she would like to be treated Set up stat labs for bmet (GFR) before px  Disc poss side effects to watch for  Recommend fluids and rest Symptom control, delsym and try tylenol for fever prn  Watch closely for wheeze or sob Update if not starting to improve in a week or if worsening  ER precautions reviewed         Follow Up Instructions:    We will set up stat labs for kidney function  Once that returns I can send Paxlovid to your pharmacy   We will let you know as soon as your labs return  Side effects in some people include dizziness and loose stools  Try and keep up with fluids Rest Try tylenol as directed for fever/chills/body aches Delsym is ok for cough   If severe symptoms or if you are short of breath please alert [place] and go to the ER  Update if not starting to improve in a week or if worsening   I discussed the assessment and treatment plan with the patient. The patient was provided an opportunity to ask questions and all were answered. The patient agreed with the plan and demonstrated an understanding of the instructions.   The patient was advised to call back or seek an in-person evaluation if the symptoms worsen or if the condition fails to improve as anticipated.  I provided 17 minutes of non-face-to-face time during this encounter.   Loura Pardon, MD

## 2021-12-25 NOTE — Patient Instructions (Signed)
We will set up stat labs for kidney function  Once that returns I can send Paxlovid to your pharmacy   We will let you know as soon as your labs return  Side effects in some people include dizziness and loose stools  Try and keep up with fluids Rest Try tylenol as directed for fever/chills/body aches Delsym is ok for cough   If severe symptoms or if you are short of breath please alert [place] and go to the ER  Update if not starting to improve in a week or if worsening

## 2021-12-29 NOTE — Telephone Encounter (Signed)
Called pt to check to see how she's feeling. Pt said she is doing "much better" and most of her sxs are resolved. Pt said she has no questions or concerns she needs to ask Dr. Glori Bickers. She thanked [place] for checking in on her. I advised pt if sxs worsen or she has any new sxs or any questions to let us know. Pt verbalized understanding

## 2021-12-31 ENCOUNTER — Ambulatory Visit (INDEPENDENT_AMBULATORY_CARE_PROVIDER_SITE_OTHER): Payer: Medicare Other

## 2021-12-31 DIAGNOSIS — I495 Sick sinus syndrome: Secondary | ICD-10-CM | POA: Diagnosis not present

## 2021-12-31 LAB — CUP PACEART REMOTE DEVICE CHECK
Battery Impedance: 1754 Ohm
Battery Remaining Longevity: 50 mo
Battery Voltage: 2.75 V
Brady Statistic AP VP Percent: 0 %
Brady Statistic AP VS Percent: 6 %
Brady Statistic AS VP Percent: 0 %
Brady Statistic AS VS Percent: 94 %
Date Time Interrogation Session: 20230105065414
Implantable Lead Implant Date: 20130122
Implantable Lead Implant Date: 20130122
Implantable Lead Location: 753859
Implantable Lead Location: 753860
Implantable Lead Model: 4092
Implantable Lead Model: 5076
Implantable Pulse Generator Implant Date: 20130122
Lead Channel Impedance Value: 364 Ohm
Lead Channel Impedance Value: 685 Ohm
Lead Channel Pacing Threshold Amplitude: 0.5 V
Lead Channel Pacing Threshold Amplitude: 0.625 V
Lead Channel Pacing Threshold Pulse Width: 0.4 ms
Lead Channel Pacing Threshold Pulse Width: 0.4 ms
Lead Channel Setting Pacing Amplitude: 1.5 V
Lead Channel Setting Pacing Amplitude: 2 V
Lead Channel Setting Pacing Pulse Width: 0.4 ms
Lead Channel Setting Sensing Sensitivity: 5.6 mV

## 2022-01-11 NOTE — Progress Notes (Signed)
Remote pacemaker transmission.   

## 2022-02-22 ENCOUNTER — Other Ambulatory Visit: Payer: Self-pay | Admitting: Physician Assistant

## 2022-02-22 DIAGNOSIS — R351 Nocturia: Secondary | ICD-10-CM

## 2022-02-23 ENCOUNTER — Other Ambulatory Visit: Payer: Self-pay

## 2022-02-23 MED ORDER — MIDODRINE HCL 5 MG PO TABS: 5.0000 mg | ORAL_TABLET | Freq: Three times a day (TID) | ORAL | 3 refills | Status: DC

## 2022-03-15 ENCOUNTER — Encounter: Payer: Self-pay | Admitting: Urology

## 2022-03-15 ENCOUNTER — Other Ambulatory Visit: Payer: Self-pay

## 2022-03-15 ENCOUNTER — Ambulatory Visit: Payer: Medicare Other | Admitting: Physician Assistant

## 2022-03-15 ENCOUNTER — Ambulatory Visit (INDEPENDENT_AMBULATORY_CARE_PROVIDER_SITE_OTHER): Payer: Medicare Other | Admitting: Urology

## 2022-03-15 VITALS — BP 156/96 | HR 88 | Ht 63.0 in | Wt 145.0 lb

## 2022-03-15 DIAGNOSIS — R3915 Urgency of urination: Secondary | ICD-10-CM

## 2022-03-15 DIAGNOSIS — N3946 Mixed incontinence: Secondary | ICD-10-CM

## 2022-03-15 LAB — BLADDER SCAN AMB NON-IMAGING: Scan Result: 153

## 2022-03-15 NOTE — Progress Notes (Signed)
? ?03/15/2022 ?8:55 AM  ? ?Regina Hernandez ?24-Mar-1934 ?[id number] ? ?Referring provider: Lesleigh Noe, MD ?[facility],  [place] [address] ? ?Chief Complaint  ?Patient presents with  ? Nocturia  ? ? ?HPI: ?Sam: Follow-up for overactive bladder and asymptomatic bacteriuria.  The patient is not having infections.  Wears 1 pad a day.  Wants to stop the medication and live with the problem.  She is pleased with her overall bladder function ? ? ?PMH: ?Past Medical History:  ?Diagnosis Date  ? Anxiety   ? Cervical dystonia   ? Essential tremor   ? GI bleed   ? Memory loss   ? Migraine   ? Occipital neuralgia   ? ? ?Surgical History: ?Past Surgical History:  ?Procedure Laterality Date  ? BOWEL RESECTION    ? CARDIAC CATHETERIZATION  2016  ? no stents/[facility]  ? ESOPHAGOGASTRODUODENOSCOPY N/A 06/18/2020  ? Procedure: ESOPHAGOGASTRODUODENOSCOPY (EGD);  Surgeon: Toledo, Benay Pike, MD;  Location: [facility] ENDOSCOPY;  Service: Gastroenterology;  Laterality: N/A;  ? PACEMAKER IMPLANT    ? REPLACEMENT TOTAL KNEE Left   ? ? ?Home Medications:  ?Allergies as of 03/15/2022   ? ?   Reactions  ? Adhesive [tape] Other (See Comments)  ? Contact dermatitis  ? Baclofen Rash  ? Lyrica [pregabalin] Other (See Comments)  ? Unable to remember reaction.  ? Sulfa Antibiotics Rash  ? ?  ? ?  ?Medication List  ?  ? ?  ? Accurate as of March 15, 2022  8:55 AM. If you have any questions, ask your nurse or doctor.  ?  ?  ? ?  ? ?acetaminophen 500 MG tablet ?Commonly known as: TYLENOL ?Take 2 tablets (1,000 mg total) by mouth every 8 (eight) hours as needed for mild pain or moderate pain. ?What changed:  ?how much to take ?when to take this ?  ?ARIPiprazole 2 MG tablet ?Commonly known as: ABILIFY ?Take 2 mg by mouth every morning. ?  ?aspirin EC 81 MG tablet ?Take 81 mg by mouth daily. Swallow whole. ?  ?docusate sodium 100 MG capsule ?Commonly known as: COLACE ?Take 100 mg by mouth daily. ?  ?estradiol 0.1 MG/GM vaginal  cream ?Commonly known as: ESTRACE ?Place 1 Applicatorful vaginally as needed. ?  ?fluvoxaMINE 50 MG tablet ?Commonly known as: LUVOX ?Take 50 mg by mouth at bedtime. ?  ?gabapentin 400 MG capsule ?Commonly known as: NEURONTIN ?Take 1 capsule (400 mg total) by mouth 2 (two) times daily. ?  ?memantine 10 MG tablet ?Commonly known as: NAMENDA ?Take 1 tablet (10 mg total) by mouth 2 (two) times daily. ?  ?midodrine 5 MG tablet ?Commonly known as: PROAMATINE ?Take 1 tablet (5 mg total) by mouth 3 (three) times daily with meals. ?  ?Myrbetriq 50 MG Tb24 tablet ?Generic drug: mirabegron ER ?TAKE 1 TABLET BY MOUTH EVERY DAY ?  ?TYLENOL PM EXTRA STRENGTH PO ?Take 500 mg by mouth at bedtime. ?  ?venlafaxine XR 75 MG 24 hr capsule ?Commonly known as: EFFEXOR-XR ?Take 75 mg by mouth 3 (three) times daily. ?  ?vitamin B-12 1000 MCG tablet ?Commonly known as: CYANOCOBALAMIN ?Take 1,000 mcg by mouth daily. ?  ?VITAMIN D3 PO ?Take 25 mcg by mouth daily. ?  ? ?  ? ? ?Allergies:  ?Allergies  ?Allergen Reactions  ? Adhesive [Tape] Other (See Comments)  ?  Contact dermatitis  ? Baclofen Rash  ? Lyrica [Pregabalin] Other (See Comments)  ?  Unable to  remember reaction.  ? Sulfa Antibiotics Rash  ? ? ?Family History: ?Family History  ?Problem Relation Age of Onset  ? Other Mother   ?     "old age"  ? Ulcers Father   ? Stomach cancer Brother   ? ? ?Social History:  reports that she has never smoked. She has never used smokeless tobacco. She reports that she does not currently use alcohol. She reports that she does not use drugs. ? ?ROS: ?  ? ?  ? ?  ? ?  ? ?  ? ?  ? ?  ? ?  ? ?  ? ?  ? ?  ? ?  ? ?  ? ?Physical Exam: ?There were no vitals taken for this visit.  ?Constitutional:  Alert and oriented, No acute distress. ?HEENT: [place] AT, moist mucus membranes.  Trachea midline, no masses. ? ? ?Laboratory Data: ?Lab Results  ?Component Value Date  ? WBC 5.7 07/23/2021  ? HGB 13.9 07/23/2021  ? HCT 43 07/23/2021  ? MCV 93.3 04/07/2021  ? PLT 184  07/23/2021  ? ? ?Lab Results  ?Component Value Date  ? CREATININE 0.92 12/25/2021  ? ? ?No results found for: PSA ? ?No results found for: TESTOSTERONE ? ?No results found for: HGBA1C ? ?Urinalysis ?   ?Component Value Date/Time  ? COLORURINE YELLOW (A) 07/21/2020 1130  ? APPEARANCEUR Clear 03/13/2021 0957  ? LABSPEC 1.017 07/21/2020 1130  ? PHURINE 5.0 07/21/2020 1130  ? GLUCOSEU Negative 03/13/2021 0957  ? HGBUR MODERATE (A) 07/21/2020 1130  ? BILIRUBINUR Negative 03/13/2021 0957  ? [place] NEGATIVE 07/21/2020 1130  ? PROTEINUR Negative 03/13/2021 0957  ? PROTEINUR NEGATIVE 07/21/2020 1130  ? NITRITE Negative 03/13/2021 0957  ? NITRITE POSITIVE (A) 07/21/2020 1130  ? LEUKOCYTESUR Trace (A) 03/13/2021 0957  ? LEUKOCYTESUR SMALL (A) 07/21/2020 1130  ? ? ?Pertinent Imaging: ? ? ?Assessment & Plan: Urine sent for culture.  I likely will not treat due to lack of symptoms.  She is known to have asymptomatic bacteriuria.  Stop Myrbetriq ? ?1. Urgency of micturition ? ?- Urinalysis, Complete ?- BLADDER SCAN AMB NON-IMAGING ? ? ?No follow-ups on file. ? ?Reece Packer, MD ? ?[facility] ?[address], [address] ?[place], [place] [address] ?[phone] ?  ?

## 2022-03-16 LAB — URINALYSIS, COMPLETE
Bilirubin, UA: NEGATIVE
Glucose, UA: NEGATIVE
Ketones, UA: NEGATIVE
Nitrite, UA: POSITIVE — AB
Protein,UA: NEGATIVE
Specific Gravity, UA: 1.02 (ref 1.005–1.030)
Urobilinogen, Ur: 0.2 mg/dL (ref 0.2–1.0)
pH, UA: 6 (ref 5.0–7.5)

## 2022-03-16 LAB — MICROSCOPIC EXAMINATION: WBC, UA: >30 /hpf — AB (ref 0–5)

## 2022-03-18 LAB — CULTURE, URINE COMPREHENSIVE

## 2022-03-22 ENCOUNTER — Telehealth: Payer: Self-pay

## 2022-03-22 MED ORDER — NITROFURANTOIN MONOHYD MACRO 100 MG PO CAPS: 100.0000 mg | ORAL_CAPSULE | Freq: Two times a day (BID) | ORAL | 0 refills | Status: DC

## 2022-03-22 NOTE — Telephone Encounter (Signed)
-----   Message from Bjorn Loser, MD sent at 03/22/2022  8:10 AM EDT ----- ?Macrodantin 100 mg twice a day for 7 days ?----- Message ----- ?From: Alvera Novel, CMA ?Sent: 03/18/2022   8:14 AM EDT ?To: Bjorn Loser, MD ? ? ?----- Message ----- ?From: Interface, [facility] Lab Results In ?Sent: 03/16/2022   9:36 AM EDT ?To: [facility] ? ? ? ?

## 2022-03-22 NOTE — Telephone Encounter (Signed)
Left detailed message for pt daughter. Medication sent to [facility]. ?

## 2022-04-01 ENCOUNTER — Ambulatory Visit (INDEPENDENT_AMBULATORY_CARE_PROVIDER_SITE_OTHER): Payer: Medicare Other

## 2022-04-01 ENCOUNTER — Telehealth: Payer: Self-pay

## 2022-04-01 DIAGNOSIS — I495 Sick sinus syndrome: Secondary | ICD-10-CM

## 2022-04-01 LAB — CUP PACEART REMOTE DEVICE CHECK
Battery Impedance: 1840 Ohm
Battery Remaining Longevity: 48 mo
Battery Voltage: 2.75 V
Brady Statistic AP VP Percent: 0 %
Brady Statistic AP VS Percent: 6 %
Brady Statistic AS VP Percent: 0 %
Brady Statistic AS VS Percent: 94 %
Date Time Interrogation Session: 20230406065543
Implantable Lead Implant Date: 20130122
Implantable Lead Implant Date: 20130122
Implantable Lead Location: 753859
Implantable Lead Location: 753860
Implantable Lead Model: 4092
Implantable Lead Model: 5076
Implantable Pulse Generator Implant Date: 20130122
Lead Channel Impedance Value: 379 Ohm
Lead Channel Impedance Value: 702 Ohm
Lead Channel Pacing Threshold Amplitude: 0.5 V
Lead Channel Pacing Threshold Amplitude: 0.5 V
Lead Channel Pacing Threshold Pulse Width: 0.4 ms
Lead Channel Pacing Threshold Pulse Width: 0.4 ms
Lead Channel Setting Pacing Amplitude: 1.5 V
Lead Channel Setting Pacing Amplitude: 2 V
Lead Channel Setting Pacing Pulse Width: 0.4 ms
Lead Channel Setting Sensing Sensitivity: 5.6 mV

## 2022-04-01 NOTE — Telephone Encounter (Signed)
Incoming call on triage line from pts daughter stating pt saw Dr.MacDiarmid on 3/20 and was prescribed an ABX for UTI which she finished on Mon 4/3. Today pt  wiped after urination and there was blood when she wiped. Pt denies any other urinary symptoms currently. Per Sam, ok to add to Mondays schedule. Appt made. Daughter also notes pt does better with a cath specimen vs on her own.  ?

## 2022-04-05 ENCOUNTER — Other Ambulatory Visit
Admission: RE | Admit: 2022-04-05 | Discharge: 2022-04-05 | Disposition: A | Discharge status: Home / Self Care | Payer: Medicare Other | Source: Ambulatory Visit | Attending: Urology | Admitting: Urology

## 2022-04-05 ENCOUNTER — Ambulatory Visit (INDEPENDENT_AMBULATORY_CARE_PROVIDER_SITE_OTHER): Payer: Medicare Other | Admitting: Urology

## 2022-04-05 ENCOUNTER — Other Ambulatory Visit: Payer: Self-pay | Admitting: *Deleted

## 2022-04-05 ENCOUNTER — Encounter: Payer: Self-pay | Admitting: Urology

## 2022-04-05 VITALS — BP 138/86 | HR 99 | Ht 63.0 in | Wt 153.0 lb

## 2022-04-05 DIAGNOSIS — R3915 Urgency of urination: Secondary | ICD-10-CM | POA: Diagnosis present

## 2022-04-05 DIAGNOSIS — R310 Gross hematuria: Secondary | ICD-10-CM | POA: Diagnosis not present

## 2022-04-05 LAB — URINALYSIS, COMPLETE (UACMP) WITH MICROSCOPIC
Bilirubin Urine: NEGATIVE
Glucose, UA: NEGATIVE mg/dL
Ketones, ur: NEGATIVE mg/dL
Nitrite: NEGATIVE
Protein, ur: NEGATIVE mg/dL
Specific Gravity, Urine: 1.01 (ref 1.005–1.030)
pH: 5 (ref 5.0–8.0)

## 2022-04-05 NOTE — Progress Notes (Signed)
04/15/22 ?2:07 PM  ? ?Regina Hernandez ?Sep 20, 1934 ?[id number] ? ?Referring provider:  ?Lesleigh Noe, MD ?[place],  [place] [address] ?  ?Chief Complaint  ?Patient presents with  ? Follow-up  ? Hematuria  ? ? ?Urological history ?OAB  ?- Managed on Myrbetriq  ? ?Asymptomatic Bacteriuria  ?- UA on 03/15/2022 was suspicious for infection. Urine culture grew Klebsiella aerogenes she was treated with Macrodantin  100 mg x2 daily for 7 days.  ? ? ?HPI: ?Regina Hernandez is a 88 y.o.female who presents today for further evaluation of gross hematuria. ? ?She was treated with Macrodantin 100 mg for UTI on 03/15/2022, she finished this antibiotic on 03/29/2022. She reported to have seen a scant amount of blood in her urine on 04/01/2022.  ? ?She is accompanied by her daughter, Regina Hernandez, today. She has the urge to urinate in the morning but when she tried to urinate this afternoon, she was unable to void.  She reports that she drinks a large amount of water.  ? ?She stopped taking Myrbetriq on 03/23/2022. Her pain started after she stopped taking Myrbetriq, she does not want to continue Myrbetriq because she still leaks after starting Myrbetriq and because of the cost.  ? ?She had episodes of gross hematuria that was bright red. This happens after the pain she feels while trying to urinate . She has constipation she takes stool softeners for this.  ? ?She is s/p hysterectomy.  ? ?Patient denies any modifying or aggravating factors.  Patient denies a, dysuria or suprapubic/flank pain.  Patient denies any fevers, chills, nausea or vomiting.  ? ?PMH: ?Past Medical History:  ?Diagnosis Date  ? Anxiety   ? Cervical dystonia   ? Essential tremor   ? GI bleed   ? Memory loss   ? Migraine   ? Occipital neuralgia   ? ? ?Surgical History: ?Past Surgical History:  ?Procedure Laterality Date  ? BOWEL RESECTION    ? CARDIAC CATHETERIZATION  2016  ? no stents/[facility]  ? ESOPHAGOGASTRODUODENOSCOPY N/A 06/18/2020  ?  Procedure: ESOPHAGOGASTRODUODENOSCOPY (EGD);  Surgeon: Toledo, Benay Pike, MD;  Location: [facility] ENDOSCOPY;  Service: Gastroenterology;  Laterality: N/A;  ? PACEMAKER IMPLANT    ? REPLACEMENT TOTAL KNEE Left   ? ? ?Home Medications:  ?Allergies as of 04/05/2022   ? ?   Reactions  ? Adhesive [tape] Other (See Comments)  ? Contact dermatitis  ? Baclofen Rash  ? Lyrica [pregabalin] Other (See Comments)  ? Unable to remember reaction.  ? Sulfa Antibiotics Rash  ? ?  ? ?  ?Medication List  ?  ? ?  ? Accurate as of April 05, 2022 11:59 PM. If you have any questions, ask your nurse or doctor.  ?  ?  ? ?  ? ?STOP taking these medications   ? ?nitrofurantoin (macrocrystal-monohydrate) 100 MG capsule ?Commonly known as: MACROBID ?Stopped by: Despina Hidden, CMA ?  ? ?  ? ?TAKE these medications   ? ?acetaminophen 500 MG tablet ?Commonly known as: TYLENOL ?Take 2 tablets (1,000 mg total) by mouth every 8 (eight) hours as needed for mild pain or moderate pain. ?  ?ARIPiprazole 2 MG tablet ?Commonly known as: ABILIFY ?Take 2 mg by mouth every morning. ?  ?aspirin EC 81 MG tablet ?Take 81 mg by mouth daily. Swallow whole. ?  ?docusate sodium 100 MG capsule ?Commonly known as: COLACE ?Take 100 mg by mouth daily. ?  ?estradiol 0.1 MG/GM vaginal cream ?  Commonly known as: ESTRACE ?Place 1 Applicatorful vaginally as needed. ?  ?fluvoxaMINE 50 MG tablet ?Commonly known as: LUVOX ?Take 50 mg by mouth at bedtime. ?  ?gabapentin 400 MG capsule ?Commonly known as: NEURONTIN ?Take 1 capsule (400 mg total) by mouth 2 (two) times daily. ?  ?memantine 10 MG tablet ?Commonly known as: NAMENDA ?Take 1 tablet (10 mg total) by mouth 2 (two) times daily. ?  ?midodrine 5 MG tablet ?Commonly known as: PROAMATINE ?Take 1 tablet (5 mg total) by mouth 3 (three) times daily with meals. ?  ?Myrbetriq 50 MG Tb24 tablet ?Generic drug: mirabegron ER ?Take 50 mg by mouth daily. ?  ?TYLENOL PM EXTRA STRENGTH PO ?Take 500 mg by mouth at bedtime. ?  ?venlafaxine  XR 75 MG 24 hr capsule ?Commonly known as: EFFEXOR-XR ?Take 75 mg by mouth 3 (three) times daily. ?  ?vitamin B-12 1000 MCG tablet ?Commonly known as: CYANOCOBALAMIN ?Take 1,000 mcg by mouth daily. ?  ?VITAMIN D3 PO ?Take 25 mcg by mouth daily. ?  ? ?  ? ? ?Allergies:  ?Allergies  ?Allergen Reactions  ? Adhesive [Tape] Other (See Comments)  ?  Contact dermatitis  ? Baclofen Rash  ? Lyrica [Pregabalin] Other (See Comments)  ?  Unable to remember reaction.  ? Sulfa Antibiotics Rash  ? ? ?Family History: ?Family History  ?Problem Relation Age of Onset  ? Other Mother   ?     "old age"  ? Ulcers Father   ? Stomach cancer Brother   ? ? ?Social History:  reports that she has never smoked. She has never been exposed to tobacco smoke. She has never used smokeless tobacco. She reports that she does not currently use alcohol. She reports that she does not use drugs. ? ? ?Physical Exam: ?BP 138/86   Pulse 99   Ht '5\' 3"'$  (1.6 m)   Wt 153 lb (69.4 kg)   BMI 27.10 kg/m?   ?Constitutional:  Alert and oriented, No acute distress. ?HEENT: [place] AT, moist mucus membranes.  Trachea midline, no masses. ?Cardiovascular: No clubbing, cyanosis, or edema. ?Respiratory: Normal respiratory effort, no increased work of breathing. ?Neurologic: Grossly intact, no focal deficits, moving all 4 extremities. ?Psychiatric: Normal mood and affect. ? ? ?Laboratory Data: ?Component ?    Latest Ref Rng 12/25/2021  ?Glucose ?    70 - 99 mg/dL 88   ?BUN ?    6 - 23 mg/dL 21   ?Creatinine ?    0.40 - 1.20 mg/dL 0.92   ?Sodium ?    135 - 145 mEq/L 134 (L)   ?Potassium ?    3.5 - 5.1 mEq/L 4.8   ?Chloride ?    96 - 112 mEq/L 100   ?CO2 ?    19 - 32 mEq/L 30   ?Calcium ?    8.4 - 10.5 mg/dL 9.5   ?GFR ?    >60.00 mL/min 56.67 (L)   ? ?Urinalysis ?Component ?    Latest Ref Rng 04/05/2022  ?Glucose, UA ?    NEGATIVE mg/dL NEGATIVE   ?WBC, UA ?    0 - 5 WBC/hpf 6-10   ?Bacteria, UA ?    NONE SEEN  FEW !   ?Color, Urine ?    YELLOW  YELLOW   ?Appearance ?    CLEAR   CLEAR   ?Specific Gravity, Urine ?    1.005 - 1.030  1.010   ?pH ?    5.0 - 8.0  5.0   ?Hgb urine dipstick ?    NEGATIVE  SMALL !   ?Bilirubin Urine ?    NEGATIVE  NEGATIVE   ?Ketones, ur ?    NEGATIVE mg/dL NEGATIVE   ?Protein ?    NEGATIVE mg/dL NEGATIVE   ?Nitrite ?    NEGATIVE  NEGATIVE   ?Leukocytes,Ua ?    NEGATIVE  TRACE !   ?RBC / HPF ?    0 - 5 RBC/hpf 0-5   ?Squamous Epithelial / LPF ?    0 - 5  0-5   ?I have reviewed the labs.  ? ?Pertinent Imaging ?N/A ? ? ? ?Assessment & Plan:   ? ?Gross hematuria  ?- We discussed the differential diagnosis for microscopic hematuria including nephrolithiasis, renal or upper tract tumors, bladder stones, UTIs, or bladder tumors as well as undetermined etiologies. Per AUA guidelines, I did recommend complete microscopic hematuria evaluation including CTU, possible urine cytology, and office cystoscopy. ?- Urine sent for culture to rule out any underlying infections ?- She would like her daughter [name] to be contacted about her results for urinalysis and culture results  ? ?2. Urgency ?-discontinued Myrbetriq due to cost and ineffectiveness  ?  ?Return for cysto with Dr. Matilde Sprang for gross hematuria . ? ?[facility] ?[address] ?[place], [place] [address] ?[phone] ? ?I,Kailey Littlejohn,acting as a Education administrator for [employer], PA-C.,have documented all relevant documentation on the behalf of [place], PA-C,as directed by  [facility], PA-C while in the presence of [place], PA-C. ? ?

## 2022-04-08 LAB — URINE CULTURE: Culture: 50000 — AB

## 2022-04-09 ENCOUNTER — Other Ambulatory Visit: Payer: Self-pay | Admitting: *Deleted

## 2022-04-09 MED ORDER — CEFUROXIME AXETIL 500 MG PO TABS
500.0000 mg | ORAL_TABLET | Freq: Two times a day (BID) | ORAL | 0 refills | Status: AC
Start: 2022-04-09 — End: 2022-04-16

## 2022-04-13 ENCOUNTER — Ambulatory Visit
Admission: RE | Admit: 2022-04-13 | Discharge: 2022-04-13 | Disposition: A | Discharge status: Home / Self Care | Payer: Medicare Other | Attending: Internal Medicine | Admitting: Internal Medicine

## 2022-04-13 ENCOUNTER — Encounter: Payer: Self-pay | Admitting: Internal Medicine

## 2022-04-13 ENCOUNTER — Ambulatory Visit (INDEPENDENT_AMBULATORY_CARE_PROVIDER_SITE_OTHER): Payer: Medicare Other | Admitting: Internal Medicine

## 2022-04-13 ENCOUNTER — Ambulatory Visit
Admission: RE | Admit: 2022-04-13 | Discharge: 2022-04-13 | Disposition: A | Discharge status: Home / Self Care | Payer: Medicare Other | Source: Ambulatory Visit | Attending: Internal Medicine | Admitting: Internal Medicine

## 2022-04-13 VITALS — BP 148/100 | HR 71 | Ht 63.0 in | Wt 153.2 lb

## 2022-04-13 DIAGNOSIS — Z950 Presence of cardiac pacemaker: Secondary | ICD-10-CM | POA: Diagnosis not present

## 2022-04-13 DIAGNOSIS — I495 Sick sinus syndrome: Secondary | ICD-10-CM

## 2022-04-13 DIAGNOSIS — R0602 Shortness of breath: Secondary | ICD-10-CM

## 2022-04-13 LAB — CUP PACEART INCLINIC DEVICE CHECK
Battery Impedance: 1837 Ohm
Battery Remaining Longevity: 48 mo
Battery Voltage: 2.75 V
Brady Statistic AP VP Percent: 0 %
Brady Statistic AP VS Percent: 6 %
Brady Statistic AS VP Percent: 0 %
Brady Statistic AS VS Percent: 94 %
Date Time Interrogation Session: 20230418165311
Implantable Lead Implant Date: 20130122
Implantable Lead Implant Date: 20130122
Implantable Lead Location: 753859
Implantable Lead Location: 753860
Implantable Lead Model: 4092
Implantable Lead Model: 5076
Implantable Pulse Generator Implant Date: 20130122
Lead Channel Impedance Value: 372 Ohm
Lead Channel Impedance Value: 655 Ohm
Lead Channel Pacing Threshold Amplitude: 0.5 V
Lead Channel Pacing Threshold Amplitude: 0.5 V
Lead Channel Pacing Threshold Amplitude: 0.625 V
Lead Channel Pacing Threshold Pulse Width: 0.4 ms
Lead Channel Pacing Threshold Pulse Width: 0.4 ms
Lead Channel Pacing Threshold Pulse Width: 0.4 ms
Lead Channel Sensing Intrinsic Amplitude: 11.2 mV
Lead Channel Sensing Intrinsic Amplitude: 4 mV
Lead Channel Setting Pacing Amplitude: 1.5 V
Lead Channel Setting Pacing Amplitude: 2 V
Lead Channel Setting Pacing Pulse Width: 0.4 ms
Lead Channel Setting Sensing Sensitivity: 5.6 mV

## 2022-04-13 LAB — PACEMAKER DEVICE OBSERVATION

## 2022-04-13 NOTE — Progress Notes (Signed)
? ? ? ? ?Patient Care Team: ?Lesleigh Noe, MD as PCP - General (Family Medicine) ?Marcial Pacas, MD as Consulting Physician (Neurology) ?Kate Sable, MD as Consulting Physician (Cardiology) ?Festus Aloe, MD as Consulting Physician (Urology) ?Norma Fredrickson, MD as Consulting Physician (Psychiatry) ? ? ?HPI ? ?Regina Hernandez is a 88 y.o. female seen in followup for pacemaker Medtronic implanted elsewhere remotely for tachybradycardia syndrome . ?Gen change 2012 Cx by sepsis requiring extraction and reimplantation 9/12  ? ?The patient denies chest pain, nocturnal dyspnea, orthopnea or peripheral edema.  There have been no palpitations or syncope.  Complains of progressive and limiting dyspnea over the last 6 months short of breath at 10-15 feet. ?Lightheadedness with walking.  Known orthostasis..  ? ?  ?  ?DATE TEST EF    ?9/17 MYOVIEW    % No ischemia  ?10/20 Echo  55-65%    ?3/21 Echo 55-60%    ?3/22 Echo  60-65%   ?  ?Date Cr K Hgb  ?2/21 1.0 4.9 12.8  ? 7/21  0.7 3.2<<2.8    ?2/22   12.7  ?12/22 0.92 4.8 13.9  ?  ? ? ?Records and Results Reviewed  ? ?Past Medical History:  ?Diagnosis Date  ? Anxiety   ? Cervical dystonia   ? Essential tremor   ? GI bleed   ? Memory loss   ? Migraine   ? Occipital neuralgia   ? ? ?Past Surgical History:  ?Procedure Laterality Date  ? BOWEL RESECTION    ? CARDIAC CATHETERIZATION  2016  ? no stents/[facility]  ? ESOPHAGOGASTRODUODENOSCOPY N/A 06/18/2020  ? Procedure: ESOPHAGOGASTRODUODENOSCOPY (EGD);  Surgeon: Toledo, Benay Pike, MD;  Location: [facility] ENDOSCOPY;  Service: Gastroenterology;  Laterality: N/A;  ? PACEMAKER IMPLANT    ? REPLACEMENT TOTAL KNEE Left   ? ? ?Current Meds  ?Medication Sig  ? acetaminophen (TYLENOL) 500 MG tablet Take 2 tablets (1,000 mg total) by mouth every 8 (eight) hours as needed for mild pain or moderate pain.  ? ARIPiprazole (ABILIFY) 2 MG tablet Take 2 mg by mouth every morning.  ? aspirin EC 81 MG tablet Take 81 mg by mouth  daily. Swallow whole.  ? cefUROXime (CEFTIN) 500 MG tablet Take 1 tablet (500 mg total) by mouth 2 (two) times daily with a meal for 7 days.  ? Cholecalciferol (VITAMIN D3 PO) Take 25 mcg by mouth daily.  ? diphenhydrAMINE-APAP, sleep, (TYLENOL PM EXTRA STRENGTH PO) Take 500 mg by mouth at bedtime.  ? docusate sodium (COLACE) 100 MG capsule Take 100 mg by mouth daily.  ? fluvoxaMINE (LUVOX) 50 MG tablet Take 50 mg by mouth at bedtime.  ? gabapentin (NEURONTIN) 400 MG capsule Take 1 capsule (400 mg total) by mouth 2 (two) times daily.  ? memantine (NAMENDA) 10 MG tablet Take 1 tablet (10 mg total) by mouth 2 (two) times daily.  ? midodrine (PROAMATINE) 5 MG tablet Take 1 tablet (5 mg total) by mouth 3 (three) times daily with meals.  ? venlafaxine XR (EFFEXOR-XR) 75 MG 24 hr capsule Take 75 mg by mouth 3 (three) times daily.  ? vitamin B-12 (CYANOCOBALAMIN) 1000 MCG tablet Take 1,000 mcg by mouth daily.  ? ? ?Allergies  ?Allergen Reactions  ? Adhesive [Tape] Other (See Comments)  ?  Contact dermatitis  ? Baclofen Rash  ? Lyrica [Pregabalin] Other (See Comments)  ?  Unable to remember reaction.  ? Sulfa Antibiotics Rash  ? ? ? ? ?Review of Systems negative  except from HPI and PMH ? ?Physical Exam ?BP (!) 148/100 (BP Location: Left Arm, Patient Position: Sitting, Cuff Size: Normal)   Pulse 71   Ht '5\' 3"'$  (1.6 m)   Wt 153 lb 4 oz (69.5 kg)   SpO2 98%   BMI 27.15 kg/m?  ?Well developed and well nourished in no acute distress ?HENT normal ?Neck supple with JVP-flat ?Clear ?Device pocket well healed; without hematoma or erythema.  There is no tethering  ?Regular rate and rhythm, no  gallop No murmur ?Abd-soft with active BS ?No Clubbing cyanosis  edema ?Skin-warm and dry ?A & Oriented  Grossly normal sensory and motor function ? ?ECG atrial pacing at 71 ?Interval 25/08/40 ? ?CrCl cannot be calculated (Patient's most recent lab result is older than the maximum 21 days allowed.). ? ? ?Assessment and  Plan ? ?Sinus node  dysfunction-intermittent ?  ?Pacemaker-Medtronic (DOI 9/12) ? ?Dyspnea on exertion ? ?Hypertension ? ?Orthostatic intolerance ? ?Blood pressure is mildly elevated; orthostatic lightheadedness and orthostatic vital signs are quite unimpressive. ? ?Device function is normal without interval atrial fibrillation ? ?Dyspnea is difficult for me to understand; from a cardiovascular point of view, there is no peripheral edema, no neck veins nor recumbent dyspnea.  Makes me concerned that this is pulmonary, we will get a chest x-ray.  With her being largely less ambulatory the possibility of PE ? ?Repeat the echocardiogram ? ?Oxygen dropped to about 90 with walking not too bad ? ?

## 2022-04-13 NOTE — Patient Instructions (Signed)
Medication Instructions:  ?- Your physician recommends that you continue on your current medications as directed. Please refer to the Current Medication list given to you today. ? ?*If you need a refill on your cardiac medications before your next appointment, please call your pharmacy* ? ? ?Lab Work: ?- none ordered ? ?If you have labs (blood work) drawn today and your tests are completely normal, you will receive your results only by: ?MyChart Message (if you have MyChart) OR ?A paper copy in the mail ?If you have any lab test that is abnormal or we need to change your treatment, we will call you to review the results. ? ? ?Testing/Procedures: ? ?1) Echocardiogram: ?- Your physician has requested that you have an echocardiogram. Echocardiography is a painless test that uses sound waves to create images of your heart. It provides your doctor with information about the size and shape of your heart and how well your heart?s chambers and valves are working. This procedure takes approximately one hour. There are no restrictions for this procedure. There is a possibility that an IV may need to be started during your test to inject an image enhancing agent. This is done to obtain more optimal pictures of your heart. Therefore we ask that you do at least drink some water prior to coming in to hydrate your veins.  ? ? ?2) Chest x-ray: ?- A chest x-ray takes a picture of the organs and structures inside the chest, including the heart, lungs, and blood vessels. This test can show several things, including, whether the heart is enlarges; whether fluid is building up in the lungs; and whether pacemaker / defibrillator leads are still in place. ? ?THIS IS DONE ON A WALK IN BASIS ? ?Medical Mall Entrance at [facility] ?1st desk on the right to check in ?Hours: Monday- Friday (7:30 am- 5:30 pm) ? ? ? ?Follow-Up: ?At [facility], you and your health needs are our priority.  As part of our continuing mission to provide you with  exceptional heart care, we have created designated Provider Care Teams.  These Care Teams include your primary Cardiologist (physician) and Advanced Practice Providers (APPs -  Physician Assistants and Nurse Practitioners) who all work together to provide you with the care you need, when you need it. ? ?We recommend signing up for the patient portal called "MyChart".  Sign up information is provided on this After Visit Summary.  MyChart is used to connect with patients for Virtual Visits (Telemedicine).  Patients are able to view lab/test results, encounter notes, upcoming appointments, etc.  Non-urgent messages can be sent to your provider as well.   ?To learn more about what you can do with MyChart, go to [email or web address].   ? ?Your next appointment:   ?1 year(s) ? ?The format for your next appointment:   ?In Person ? ?Provider:   ?Virl Axe, MD  ? ? ?Other Instructions ? ?Echocardiogram ?An echocardiogram is a test that uses sound waves (ultrasound) to produce images of the heart. ?Images from an echocardiogram can provide important information about: ?Heart size and shape. ?The size and thickness and movement of your heart's walls. ?Heart muscle function and strength. ?Heart valve function or if you have stenosis. Stenosis is when the heart valves are too narrow. ?If blood is flowing backward through the heart valves (regurgitation). ?A tumor or infectious growth around the heart valves. ?Areas of heart muscle that are not working well because of poor blood flow or injury from a heart attack. ?  Aneurysm detection. An aneurysm is a weak or damaged part of an artery wall. The wall bulges out from the normal force of blood pumping through the body. ?Tell a health care provider about: ?Any allergies you have. ?All medicines you are taking, including vitamins, herbs, eye drops, creams, and over-the-counter medicines. ?Any blood disorders you have. ?Any surgeries you have had. ?Any medical conditions you  have. ?Whether you are pregnant or may be pregnant. ?What are the risks? ?Generally, this is a safe test. However, problems may occur, including an allergic reaction to dye (contrast) that may be used during the test. ?What happens before the test? ?No specific preparation is needed. You may eat and drink normally. ?What happens during the test? ? ?You will take off your clothes from the waist up and put on a hospital gown. ?Electrodes or electrocardiogram (ECG)patches may be placed on your chest. The electrodes or patches are then connected to a device that monitors your heart rate and rhythm. ?You will lie down on a table for an ultrasound exam. A gel will be applied to your chest to help sound waves pass through your skin. ?A handheld device, called a transducer, will be pressed against your chest and moved over your heart. The transducer produces sound waves that travel to your heart and bounce back (or "echo" back) to the transducer. These sound waves will be captured in real-time and changed into images of your heart that can be viewed on a video monitor. The images will be recorded on a computer and reviewed by your health care provider. ?You may be asked to change positions or hold your breath for a short time. This makes it easier to get different views or better views of your heart. ?In some cases, you may receive contrast through an IV in one of your veins. This can improve the quality of the pictures from your heart. ?The procedure may vary among health care providers and hospitals. ?What can I expect after the test? ?You may return to your normal, everyday life, including diet, activities, and medicines, unless your health care provider tells you not to do that. ?Follow these instructions at home: ?It is up to you to get the results of your test. Ask your health care provider, or the department that is doing the test, when your results will be ready. ?Keep all follow-up visits. This is  important. ?Summary ?An echocardiogram is a test that uses sound waves (ultrasound) to produce images of the heart. ?Images from an echocardiogram can provide important information about the size and shape of your heart, heart muscle function, heart valve function, and other possible heart problems. ?You do not need to do anything to prepare before this test. You may eat and drink normally. ?After the echocardiogram is completed, you may return to your normal, everyday life, unless your health care provider tells you not to do that. ?This information is not intended to replace advice given to you by your health care provider. Make sure you discuss any questions you have with your health care provider. ?Document Revised: 08/26/2021 Document Reviewed: 08/05/2020 ?Elsevier Patient Education ? [employer]. ? ? ?Important Information About Sugar ? ? ? ? ? ? ?

## 2022-04-16 NOTE — Progress Notes (Signed)
Remote pacemaker transmission.   

## 2022-04-21 ENCOUNTER — Ambulatory Visit (INDEPENDENT_AMBULATORY_CARE_PROVIDER_SITE_OTHER): Payer: Medicare Other

## 2022-04-21 DIAGNOSIS — R0602 Shortness of breath: Secondary | ICD-10-CM

## 2022-04-21 LAB — ECHOCARDIOGRAM COMPLETE
AR max vel: 3.5 cm2
AV Area VTI: 3.38 cm2
AV Area mean vel: 3.01 cm2
AV Mean grad: 3 mmHg
AV Peak grad: 5.3 mmHg
Ao pk vel: 1.15 m/s
Area-P 1/2: 3.46 cm2
Calc EF: 57.1 %
S' Lateral: 2.5 cm
Single Plane A2C EF: 58.7 %
Single Plane A4C EF: 61.4 %

## 2022-04-23 ENCOUNTER — Ambulatory Visit: Payer: Medicare Other

## 2022-04-26 NOTE — Progress Notes (Signed)
04/28/22 ?8:20 AM  ? ?Regina Hernandez ?[birth year] ?[id number] ? ?Referring provider:  ?Lesleigh Noe, MD ?[address],  [place] [address] ?  ?Chief Complaint  ?Patient presents with  ? Follow-up  ?  3wk UA recheck  ? ?Urological history: ?1. OAB ?-Contributing factors of age, vaginal atrophy, hypertension, depression, anxiety and memory loss ? ?HPI: ?Regina Hernandez is a 88 y.o.female who presents today for further evaluation of gross hematuria after being treated for an UTI with her daughter, Lovey Newcomer.   ? ?She states the burning with urination returned approximately 3 days after she completed her antibiotic, but she has not had any further gross hematuria.  She still has difficulty initiating her urinary stream in the mornings.  She states when she drinks 4-6 bottles of water daily then the burning will dissipate, but she is concerned of developing hyponatremia with this amount of water intake. ? ?Patient denies any modifying or aggravating factors.  Patient denies any gross hematuria or suprapubic/flank pain.  Patient denies any fevers, chills, nausea or vomiting.   ? ?CATH UA 11-30 WBC's.  PVR 60 mL. ? ?PMH: ?Past Medical History:  ?Diagnosis Date  ? Anxiety   ? Cervical dystonia   ? Essential tremor   ? GI bleed   ? Memory loss   ? Migraine   ? Occipital neuralgia   ? ? ?Surgical History: ?Past Surgical History:  ?Procedure Laterality Date  ? BOWEL RESECTION    ? CARDIAC CATHETERIZATION  2016  ? no stents/[facility]  ? ESOPHAGOGASTRODUODENOSCOPY N/A 06/18/2020  ? Procedure: ESOPHAGOGASTRODUODENOSCOPY (EGD);  Surgeon: Toledo, Benay Pike, MD;  Location: [facility] ENDOSCOPY;  Service: Gastroenterology;  Laterality: N/A;  ? PACEMAKER IMPLANT    ? REPLACEMENT TOTAL KNEE Left   ? ? ?Home Medications:  ?Allergies as of 04/27/2022   ? ?   Reactions  ? Adhesive [tape] Other (See Comments)  ? Contact dermatitis  ? Baclofen Rash  ? Lyrica [pregabalin] Other (See Comments)  ? Unable to remember reaction.  ? Sulfa  Antibiotics Rash  ? ?  ? ?  ?Medication List  ?  ? ?  ? Accurate as of Apr 27, 2022 11:59 PM. If you have any questions, ask your nurse or doctor.  ?  ?  ? ?  ? ?acetaminophen 500 MG tablet ?Commonly known as: TYLENOL ?Take 2 tablets (1,000 mg total) by mouth every 8 (eight) hours as needed for mild pain or moderate pain. ?  ?ARIPiprazole 2 MG tablet ?Commonly known as: ABILIFY ?Take 2 mg by mouth every morning. ?  ?aspirin EC 81 MG tablet ?Take 81 mg by mouth daily. Swallow whole. ?  ?docusate sodium 100 MG capsule ?Commonly known as: COLACE ?Take 100 mg by mouth daily. ?  ?estradiol 0.1 MG/GM vaginal cream ?Commonly known as: ESTRACE ?Place 1 Applicatorful vaginally as needed. ?  ?fluvoxaMINE 50 MG tablet ?Commonly known as: LUVOX ?Take 50 mg by mouth at bedtime. ?  ?gabapentin 400 MG capsule ?Commonly known as: NEURONTIN ?Take 1 capsule (400 mg total) by mouth 2 (two) times daily. ?  ?memantine 10 MG tablet ?Commonly known as: NAMENDA ?Take 1 tablet (10 mg total) by mouth 2 (two) times daily. ?  ?midodrine 5 MG tablet ?Commonly known as: PROAMATINE ?Take 1 tablet (5 mg total) by mouth 3 (three) times daily with meals. ?  ?TYLENOL PM EXTRA STRENGTH PO ?Take 500 mg by mouth at bedtime. ?  ?venlafaxine XR 75 MG 24 hr capsule ?Commonly  known as: EFFEXOR-XR ?Take 75 mg by mouth 3 (three) times daily. ?  ?vitamin B-12 1000 MCG tablet ?Commonly known as: CYANOCOBALAMIN ?Take 1,000 mcg by mouth daily. ?  ?VITAMIN D3 PO ?Take 25 mcg by mouth daily. ?  ? ?  ? ? ?Allergies:  ?Allergies  ?Allergen Reactions  ? Adhesive [Tape] Other (See Comments)  ?  Contact dermatitis  ? Baclofen Rash  ? Lyrica [Pregabalin] Other (See Comments)  ?  Unable to remember reaction.  ? Sulfa Antibiotics Rash  ? ? ?Family History: ?Family History  ?Problem Relation Age of Onset  ? Other Mother   ?     "old age"  ? Ulcers Father   ? Stomach cancer Brother   ? ? ?Social History:  reports that she has never smoked. She has never been exposed to  tobacco smoke. She has never used smokeless tobacco. She reports that she does not currently use alcohol. She reports that she does not use drugs. ? ? ?Physical Exam: ?BP 112/77   Pulse 88   Ht '5\' 4"'$  (1.626 m)   Wt 153 lb (69.4 kg)   BMI 26.26 kg/m?   ?Constitutional:  Well nourished. Alert and oriented, No acute distress. ?HEENT: [place] AT, moist mucus membranes.  Trachea midline, no masses. ?Cardiovascular: No clubbing, cyanosis, or edema. ?Respiratory: Normal respiratory effort, no increased work of breathing. ?GU: No CVA tenderness.  No bladder fullness or masses.  Atrophic external genitalia, normal pubic hair distribution, no lesions.  Normal urethral meatus, no lesions, no prolapse, no discharge.   No urethral masses, tenderness and/or tenderness. No bladder fullness, tenderness or masses. Pale vagina mucosa, fair estrogen effect, no discharge, no lesions.  ?Neurologic: Grossly intact, no focal deficits, moving all 4 extremities. ?Psychiatric: Normal mood and affect.   ? ? ?Laboratory Data: ?Urinalysis ?Component ?    Latest Ref Rng 04/27/2022  ?Specific Gravity, UA ?    1.005 - 1.030  1.015   ?pH, UA ?    5.0 - 7.5  5.5   ?Color, UA ?    Yellow  Yellow   ?Appearance Ur ?    Clear  Clear   ?Leukocytes,UA ?    Negative  Trace !   ?[place] ?    Negative/Trace  Negative   ?Glucose, UA ?    Negative  Negative   ?Ketones, UA ?    Negative  Negative   ?RBC, UA ?    Negative  1+ !   ?Bilirubin, UA ?    Negative  Negative   ?Urobilinogen, Ur ?    0.2 - 1.0 mg/dL 0.2   ?Nitrite, UA ?    Negative  Negative   ?Microscopic Examination See below:   ? ?Component ?    Latest Ref Rng 04/27/2022  ?RBC ?    0 - 2 /hpf 0-2   ?WBC, UA ?    0 - 5 /hpf 11-30 !   ?Epithelial Cells (non renal) ?    0 - 10 /hpf 0-10   ?Bacteria, UA ?    None seen/Few  None seen   ?I have reviewed the labs.  ? ?Pertinent Imaging ?No recent imaging ? ?In and Out Catheterization ?Patient is present today for a I & O catheterization due to rUTI's. Patient  was cleaned and prepped in a sterile fashion with betadine . A 14 FR cath was inserted no complications were noted , 60 ml of urine return was noted, urine was yellow in color. A  clean urine sample was collected for urinalysis and urine culture. Bladder was drained  And catheter was removed with out difficulty.   ? ?Performed by: Zara Council, PA-C  ? ?Assessment & Plan:   ? ?Gross hematuria  ?-UA neg for micro heme ?-no reports of gross heme ?-CTU on hold at this time ? ?2. Suspected UTI ?-UA with pyuria ?-Urine sent for culture ?-Cystoscopy pending further further evaluation of symptoms of dysuria and inability to void  ? ?3. Urgency ?-discontinued Myrbetriq due to cost and ineffectiveness  ? ?4. Vaginal atrophy ?-Encouraged patient to apply the vaginal estrogen cream as prescribed by her previous urologist ?  ?Return for return for cysto . ? ?Neleh Muldoon, PA-C  ? ?[facility] ?[address], [address] ?[place], [place] [address] ?[phone] ? ? ? ?

## 2022-04-27 ENCOUNTER — Ambulatory Visit (INDEPENDENT_AMBULATORY_CARE_PROVIDER_SITE_OTHER): Payer: Medicare Other | Admitting: Urology

## 2022-04-27 ENCOUNTER — Encounter: Payer: Self-pay | Admitting: Urology

## 2022-04-27 VITALS — BP 112/77 | HR 88 | Ht 64.0 in | Wt 153.0 lb

## 2022-04-27 DIAGNOSIS — R3989 Other symptoms and signs involving the genitourinary system: Secondary | ICD-10-CM

## 2022-04-27 DIAGNOSIS — R3915 Urgency of urination: Secondary | ICD-10-CM

## 2022-04-27 DIAGNOSIS — N952 Postmenopausal atrophic vaginitis: Secondary | ICD-10-CM | POA: Diagnosis not present

## 2022-04-27 DIAGNOSIS — Z87440 Personal history of urinary (tract) infections: Secondary | ICD-10-CM | POA: Diagnosis not present

## 2022-04-27 DIAGNOSIS — R8281 Pyuria: Secondary | ICD-10-CM | POA: Diagnosis not present

## 2022-04-27 DIAGNOSIS — R310 Gross hematuria: Secondary | ICD-10-CM

## 2022-04-27 LAB — URINALYSIS, COMPLETE
Bilirubin, UA: NEGATIVE
Glucose, UA: NEGATIVE
Ketones, UA: NEGATIVE
Nitrite, UA: NEGATIVE
Protein,UA: NEGATIVE
Specific Gravity, UA: 1.015 (ref 1.005–1.030)
Urobilinogen, Ur: 0.2 mg/dL (ref 0.2–1.0)
pH, UA: 5.5 (ref 5.0–7.5)

## 2022-04-27 LAB — MICROSCOPIC EXAMINATION: Bacteria, UA: NONE SEEN

## 2022-04-28 ENCOUNTER — Encounter: Payer: Self-pay | Admitting: Internal Medicine

## 2022-04-30 ENCOUNTER — Telehealth: Payer: Self-pay

## 2022-04-30 LAB — CULTURE, URINE COMPREHENSIVE

## 2022-04-30 MED ORDER — CIPROFLOXACIN HCL 250 MG PO TABS
250.0000 mg | ORAL_TABLET | Freq: Two times a day (BID) | ORAL | 0 refills | Status: AC
Start: 2022-04-30 — End: 2022-05-07

## 2022-04-30 NOTE — Telephone Encounter (Signed)
-----   Message from Nori Riis, PA-C sent at 04/30/2022  8:29 AM EDT ----- ?Please let Mrs. Clinkscales know that her urine culture was positive for infection and she needs to start Cipro 250 mg twice daily for seven days.  ?

## 2022-04-30 NOTE — Telephone Encounter (Signed)
Notified pt as advised, sent in Rx, pt expressed understanding.  ?

## 2022-05-03 ENCOUNTER — Ambulatory Visit (INDEPENDENT_AMBULATORY_CARE_PROVIDER_SITE_OTHER): Payer: Medicare Other | Admitting: Cardiology

## 2022-05-03 ENCOUNTER — Encounter: Payer: Self-pay | Admitting: Cardiology

## 2022-05-03 VITALS — BP 150/100 | HR 77 | Ht 64.0 in | Wt 156.0 lb

## 2022-05-03 DIAGNOSIS — Z950 Presence of cardiac pacemaker: Secondary | ICD-10-CM

## 2022-05-03 DIAGNOSIS — I951 Orthostatic hypotension: Secondary | ICD-10-CM

## 2022-05-03 MED ORDER — MIDODRINE HCL 5 MG PO TABS: 5.0000 mg | ORAL_TABLET | Freq: Three times a day (TID) | ORAL | 3 refills | Status: DC

## 2022-05-03 NOTE — Progress Notes (Signed)
?Cardiology Office Note:   ? ?Date:  05/03/2022  ? ?ID:  Regina Hernandez, DOB 1934/02/20, MRN [id number] ? ?PCP:  Lesleigh Noe, MD  ?Cardiologist:  None  ?Electrophysiologist:  None  ? ?Referring MD: Lesleigh Noe, MD  ? ?Chief Complaint  ?Patient presents with  ? OTher  ?  12 month follow up -- Meds reviewed verbally with patient.   ? ? ?History of Present Illness:   ? ?Regina Hernandez is a 88 y.o. female with a hx of anxiety, bradycardia, orthostatic hypotension, sick sinus syndrome s/p permanent pacemaker (medtronic in 2013) who presents for follow-up. ? ?Previously seen for orthostatic hypotension.  Started on midodrine, titrated to 5 mg 3 times daily.  Symptoms improved/resolved with midodrine.  She feels well, would like a parking permit as she is not able to ambulate for long distances.  Uses a walker. ? ? ?Prior notes ?Echo 03/2022 EF 60 to 65%, mild aortic valve sclerosis with no stenosis ? ?She was previously seen at [facility] cardiology in [place].  Patient recently moved to the area from [place].  She denies any symptoms of chest pain or shortness of breath at rest or with exertion.  She is able to do all activities of daily living without chest pain or breathing problems.  She ambulates with a walker.  She states having a history of cardiac valve leakage.  Her pacemaker was placed roughly 7 years ago.  Left heart cath in 2012 at [facility] showed minimal irregularities in the LAD and RCA. ? ?Pacemaker lastly interrogated on 01/16/2020, report states pacemaker was functioning appropriately.Patient recently admitted to the hospital 06/2020 due to weakness, diarrhea and dizziness.  Diagnosed with sepsis likely from colitis.  Managed with IV fluids and antibiotics. Patient was  admitted to the hospital in June 2021 due to bloody stools, diagnosed with peptic ulcer disease, received 2 units of packed red blood cells.  Aspirin was stopped.   ? ?Echocardiogram 05/2020 showed normal systolic function,  impaired relaxation, EF 65%. ? ?Past Medical History:  ?Diagnosis Date  ? Anxiety   ? Cervical dystonia   ? Essential tremor   ? GI bleed   ? Memory loss   ? Migraine   ? Occipital neuralgia   ? ? ?Past Surgical History:  ?Procedure Laterality Date  ? BOWEL RESECTION    ? CARDIAC CATHETERIZATION  2016  ? no stents/[facility]  ? ESOPHAGOGASTRODUODENOSCOPY N/A 06/18/2020  ? Procedure: ESOPHAGOGASTRODUODENOSCOPY (EGD);  Surgeon: Toledo, Benay Pike, MD;  Location: [facility] ENDOSCOPY;  Service: Gastroenterology;  Laterality: N/A;  ? PACEMAKER IMPLANT    ? REPLACEMENT TOTAL KNEE Left   ? ? ?Current Medications: ?Current Meds  ?Medication Sig  ? acetaminophen (TYLENOL) 500 MG tablet Take 2 tablets (1,000 mg total) by mouth every 8 (eight) hours as needed for mild pain or moderate pain.  ? ARIPiprazole (ABILIFY) 2 MG tablet Take 2 mg by mouth every morning.  ? aspirin EC 81 MG tablet Take 81 mg by mouth daily. Swallow whole.  ? Cholecalciferol (VITAMIN D3 PO) Take 25 mcg by mouth daily.  ? ciprofloxacin (CIPRO) 250 MG tablet Take 1 tablet (250 mg total) by mouth 2 (two) times daily for 7 days.  ? diphenhydrAMINE-APAP, sleep, (TYLENOL PM EXTRA STRENGTH PO) Take 500 mg by mouth at bedtime.  ? docusate sodium (COLACE) 100 MG capsule Take 100 mg by mouth daily.  ? estradiol (ESTRACE) 0.1 MG/GM vaginal cream Place 1 Applicatorful vaginally as needed.  ? fluvoxaMINE (  LUVOX) 50 MG tablet Take 50 mg by mouth at bedtime.  ? gabapentin (NEURONTIN) 400 MG capsule Take 1 capsule (400 mg total) by mouth 2 (two) times daily.  ? memantine (NAMENDA) 10 MG tablet Take 1 tablet (10 mg total) by mouth 2 (two) times daily.  ? venlafaxine XR (EFFEXOR-XR) 75 MG 24 hr capsule Take 75 mg by mouth 3 (three) times daily.  ? vitamin B-12 (CYANOCOBALAMIN) 1000 MCG tablet Take 1,000 mcg by mouth daily.  ? [DISCONTINUED] midodrine (PROAMATINE) 5 MG tablet Take 1 tablet (5 mg total) by mouth 3 (three) times daily with meals.  ?  ? ?Allergies:    Adhesive [tape], Baclofen, Lyrica [pregabalin], and Sulfa antibiotics  ? ?Social History  ? ?Socioeconomic History  ? Marital status: Widowed  ?  Spouse name: Not on file  ? Number of children: 5  ? Years of education: 12th  ? Highest education level: High school graduate  ?Occupational History  ? Occupation: Retired  ?Tobacco Use  ? Smoking status: Never  ?  Passive exposure: Never  ? Smokeless tobacco: Never  ?Vaping Use  ? Vaping Use: Never used  ?Substance and Sexual Activity  ? Alcohol use: Not Currently  ? Drug use: Never  ? Sexual activity: Not Currently  ?Other Topics Concern  ? Not on file  ?Social History Narrative  ? Lives at home with her daughters.  ? Right-handed.  ? Two cups caffeine per day.  ?   ? 02/10/21  ? From: [place] - moved to [facility] 2004  ? Living: with daughter [name] and Ronny Bacon granddaughter and [name] (daughter)  ? Work: retired - daycare   ?   ? Family: Lives with daughters - [name] and [name], 3 other children across the country - too many to count ~15, a few great-grandchildren  ?   ? Enjoys: play cards, word search books, watch TV, relaxing outside  ?   ? Exercise: not currently  ? Diet: good appetite, cauliflower, chicken, likes most things  ?   ? Safety  ? Seat belts: Yes   ? Guns: No  ? Safe in relationships: Yes   ? ?Social Determinants of Health  ? ?Financial Resource Strain: Not on file  ?Food Insecurity: Not on file  ?Transportation Needs: Not on file  ?Physical Activity: Not on file  ?Stress: Not on file  ?Social Connections: Not on file  ?  ? ?Family History: ?The patient's family history includes Other in her mother; Stomach cancer in her brother; Ulcers in her father. ? ?ROS:   ?Please see the history of present illness.    ? All other systems reviewed and are negative. ? ?EKGs/Labs/Other Studies Reviewed:   ? ?The following studies were reviewed today: ? ? ?EKG:  EKG is  ordered today.  EG shows atrial paced rhythm ? ?Recent Labs: ?07/23/2021: ALT 13; Hemoglobin 13.9; Platelets  184 ?12/25/2021: BUN 21; Creatinine, Ser 0.92; Potassium 4.8; Sodium 134  ?Recent Lipid Panel ?   ?Component Value Date/Time  ? CHOL 225 (A) 07/23/2021 0000  ? TRIG 102 07/23/2021 0000  ? HDL 99 (A) 07/23/2021 0000  ? [place] 106 07/23/2021 0000  ? ? ?Physical Exam:   ? ?VS:  BP (!) 150/100 (BP Location: Left Arm, Patient Position: Sitting, Cuff Size: Normal)   Pulse 77   Ht '5\' 4"'$  (1.626 m)   Wt 156 lb (70.8 kg)   SpO2 97%   BMI 26.78 kg/m?    ? ?Wt Readings from Last 3  Encounters:  ?05/03/22 156 lb (70.8 kg)  ?04/27/22 153 lb (69.4 kg)  ?04/13/22 153 lb 4 oz (69.5 kg)  ?  ? ?GEN:  Well nourished, well developed in no acute distress ?HEENT: Normal ?NECK: No JVD; No carotid bruits ?LYMPHATICS: No lymphadenopathy ?CARDIAC: RRR, 1/6 systolic murmur ?RESPIRATORY:  Clear to auscultation without rales, wheezing or rhonchi  ?ABDOMEN: Soft, non-tender, non-distended ?MUSCULOSKELETAL:  No edema; No deformity  ?SKIN: Warm and dry ?NEUROLOGIC:  Alert and oriented x 3 ?PSYCHIATRIC:  Normal affect  ? ?ASSESSMENT:   ? ?1. Orthostatic hypotension   ?2. Pacemaker   ? ? ?PLAN:   ? ?In order of problems listed above: ? ?dizziness, orthostatic hypotension.  Symptoms have resolved on midodrine.  Continue midodrine 5 mg 3 times daily.  ?Hx of bradycardia, sick sinus syndrome s/p pacemaker.  keep appointments with device clinic for frequent pacemaker checks.   ?. ?Follow-up in 1 year ? ?This note was generated in part or whole with voice recognition software. Voice recognition is usually quite accurate but there are transcription errors that can and very often do occur. I apologize for any typographical errors that were not detected and corrected. ? ?Medication Adjustments/Labs and Tests Ordered: ?Current medicines are reviewed at length with the patient today.  Concerns regarding medicines are outlined above.  ?Orders Placed This Encounter  ?Procedures  ? EKG 12-Lead  ? ?Meds ordered this encounter  ?Medications  ? midodrine  (PROAMATINE) 5 MG tablet  ?  Sig: Take 1 tablet (5 mg total) by mouth 3 (three) times daily with meals.  ?  Dispense:  270 tablet  ?  Refill:  3  ? ? ?Patient Instructions  ?Medication Instructions:  ?Your physician

## 2022-05-03 NOTE — Patient Instructions (Signed)

## 2022-05-10 ENCOUNTER — Encounter: Payer: Self-pay | Admitting: Urology

## 2022-05-10 ENCOUNTER — Ambulatory Visit (INDEPENDENT_AMBULATORY_CARE_PROVIDER_SITE_OTHER): Payer: Medicare Other | Admitting: Urology

## 2022-05-10 VITALS — BP 153/88 | HR 88 | Ht 64.0 in | Wt 156.0 lb

## 2022-05-10 DIAGNOSIS — N3020 Other chronic cystitis without hematuria: Secondary | ICD-10-CM | POA: Diagnosis not present

## 2022-05-10 DIAGNOSIS — R310 Gross hematuria: Secondary | ICD-10-CM

## 2022-05-10 DIAGNOSIS — R8271 Bacteriuria: Secondary | ICD-10-CM | POA: Diagnosis not present

## 2022-05-10 LAB — MICROSCOPIC EXAMINATION

## 2022-05-10 LAB — URINALYSIS, COMPLETE
Bilirubin, UA: NEGATIVE
Glucose, UA: NEGATIVE
Ketones, UA: NEGATIVE
Leukocytes,UA: NEGATIVE
Nitrite, UA: NEGATIVE
Protein,UA: NEGATIVE
Specific Gravity, UA: 1.025 (ref 1.005–1.030)
Urobilinogen, Ur: 0.2 mg/dL (ref 0.2–1.0)
pH, UA: 5.5 (ref 5.0–7.5)

## 2022-05-10 MED ORDER — NITROFURANTOIN MACROCRYSTAL 100 MG PO CAPS: 100.0000 mg | ORAL_CAPSULE | Freq: Every day | ORAL | 2 refills | Status: DC

## 2022-05-10 NOTE — Progress Notes (Signed)
? ?05/10/2022 ?9:11 AM  ? ?Regina Hernandez ?1934-03-03 ?[id number] ? ?Referring provider: Lesleigh Noe, MD ?[place],  [place] [address] ? ?Chief Complaint  ?Patient presents with  ? Cysto  ? ? ?HPI: ?Follow-up for overactive bladder and asymptomatic bacteriuria.  The patient is not having infections.  Wears 1 pad a day.  Wants to stop the medication and live with the problem.  She is pleased with her overall bladder function  Urine sent for culture.  I likely will not treat due to lack of symptoms.  She is known to have asymptomatic bacteriuria.  Stop Myrbetriq ? ?I last saw the patient March 15, 2022.  She did have a positive culture March 15, 2022.  She had a small amount of blood April 01, 2022.  She had a positive culture at this time in April and then another positive culture in May 2023.  Normal renal ultrasound 2021 ? ?Clinically not infected today.  No burning or frequency. ? ?Cystoscopy: Patient underwent flexible cystoscopy.  Bladder mucosa and trigone were normal.  No stitch foreign body or carcinoma.  Patient has never smoked ? ? ?PMH: ?Past Medical History:  ?Diagnosis Date  ? Anxiety   ? Cervical dystonia   ? Essential tremor   ? GI bleed   ? Memory loss   ? Migraine   ? Occipital neuralgia   ? ? ?Surgical History: ?Past Surgical History:  ?Procedure Laterality Date  ? BOWEL RESECTION    ? CARDIAC CATHETERIZATION  2016  ? no stents/[facility]  ? ESOPHAGOGASTRODUODENOSCOPY N/A 06/18/2020  ? Procedure: ESOPHAGOGASTRODUODENOSCOPY (EGD);  Surgeon: Toledo, Benay Pike, MD;  Location: [facility] ENDOSCOPY;  Service: Gastroenterology;  Laterality: N/A;  ? PACEMAKER IMPLANT    ? REPLACEMENT TOTAL KNEE Left   ? ? ?Home Medications:  ?Allergies as of 05/10/2022   ? ?   Reactions  ? Adhesive [tape] Other (See Comments)  ? Contact dermatitis  ? Baclofen Rash  ? Lyrica [pregabalin] Other (See Comments)  ? Unable to remember reaction.  ? Sulfa Antibiotics Rash  ? ?  ? ?  ?Medication List  ?  ? ?  ?  Accurate as of May 10, 2022  9:11 AM. If you have any questions, ask your nurse or doctor.  ?  ?  ? ?  ? ?acetaminophen 500 MG tablet ?Commonly known as: TYLENOL ?Take 2 tablets (1,000 mg total) by mouth every 8 (eight) hours as needed for mild pain or moderate pain. ?  ?ARIPiprazole 2 MG tablet ?Commonly known as: ABILIFY ?Take 2 mg by mouth every morning. ?  ?aspirin EC 81 MG tablet ?Take 81 mg by mouth daily. Swallow whole. ?  ?docusate sodium 100 MG capsule ?Commonly known as: COLACE ?Take 100 mg by mouth daily. ?  ?estradiol 0.1 MG/GM vaginal cream ?Commonly known as: ESTRACE ?Place 1 Applicatorful vaginally as needed. ?  ?fluvoxaMINE 50 MG tablet ?Commonly known as: LUVOX ?Take 50 mg by mouth at bedtime. ?  ?gabapentin 400 MG capsule ?Commonly known as: NEURONTIN ?Take 1 capsule (400 mg total) by mouth 2 (two) times daily. ?  ?memantine 10 MG tablet ?Commonly known as: NAMENDA ?Take 1 tablet (10 mg total) by mouth 2 (two) times daily. ?  ?midodrine 5 MG tablet ?Commonly known as: PROAMATINE ?Take 1 tablet (5 mg total) by mouth 3 (three) times daily with meals. ?  ?TYLENOL PM EXTRA STRENGTH PO ?Take 500 mg by mouth at bedtime. ?  ?venlafaxine XR 75  MG 24 hr capsule ?Commonly known as: EFFEXOR-XR ?Take 75 mg by mouth 3 (three) times daily. ?  ?vitamin B-12 1000 MCG tablet ?Commonly known as: CYANOCOBALAMIN ?Take 1,000 mcg by mouth daily. ?  ?VITAMIN D3 PO ?Take 25 mcg by mouth daily. ?  ? ?  ? ? ?Allergies:  ?Allergies  ?Allergen Reactions  ? Adhesive [Tape] Other (See Comments)  ?  Contact dermatitis  ? Baclofen Rash  ? Lyrica [Pregabalin] Other (See Comments)  ?  Unable to remember reaction.  ? Sulfa Antibiotics Rash  ? ? ?Family History: ?Family History  ?Problem Relation Age of Onset  ? Other Mother   ?     "old age"  ? Ulcers Father   ? Stomach cancer Brother   ? ? ?Social History:  reports that she has never smoked. She has never been exposed to tobacco smoke. She has never used smokeless tobacco. She  reports that she does not currently use alcohol. She reports that she does not use drugs. ? ?ROS: ?  ? ?  ? ?  ? ?  ? ?  ? ?  ? ?  ? ?  ? ?  ? ?  ? ?  ? ?  ? ?  ? ?Physical Exam: ?There were no vitals taken for this visit.  ?Constitutional:  Alert and oriented, No acute distress. ?HEENT: [place] AT, moist mucus membranes.  Trachea midline, no masses. ?Cardiovascular: No clubbing, cyanosis, or edema. ? ? ?Laboratory Data: ?Lab Results  ?Component Value Date  ? WBC 5.7 07/23/2021  ? HGB 13.9 07/23/2021  ? HCT 43 07/23/2021  ? MCV 93.3 04/07/2021  ? PLT 184 07/23/2021  ? ? ?Lab Results  ?Component Value Date  ? CREATININE 0.92 12/25/2021  ? ? ?No results found for: PSA ? ?No results found for: TESTOSTERONE ? ?No results found for: HGBA1C ? ?Urinalysis ?   ?Component Value Date/Time  ? COLORURINE YELLOW 04/05/2022 1432  ? APPEARANCEUR Clear 04/27/2022 1314  ? LABSPEC 1.010 04/05/2022 1432  ? PHURINE 5.0 04/05/2022 1432  ? GLUCOSEU Negative 04/27/2022 1314  ? HGBUR SMALL (A) 04/05/2022 1432  ? BILIRUBINUR Negative 04/27/2022 1314  ? KETONESUR NEGATIVE 04/05/2022 1432  ? PROTEINUR Negative 04/27/2022 1314  ? PROTEINUR NEGATIVE 04/05/2022 1432  ? NITRITE Negative 04/27/2022 1314  ? NITRITE NEGATIVE 04/05/2022 1432  ? LEUKOCYTESUR Trace (A) 04/27/2022 1314  ? LEUKOCYTESUR TRACE (A) 04/05/2022 1432  ? ? ?Pertinent Imaging: ?No microscopic hematuria.  Urine sent for culture ? ?Assessment & Plan: We talked about recurrent urinary tract infections and the role of prophylaxis ?Reassess in 3 months on Macrodantin 100 mg 3x11 call if culture positive ? ?1. Gross hematuria ? ?- Urinalysis, Complete ? ? ?No follow-ups on file. ? ?Reece Packer, MD ? ?[place] ?[address] ?[place], [place] [address] ?[phone]  ?

## 2022-05-13 LAB — CULTURE, URINE COMPREHENSIVE

## 2022-06-02 ENCOUNTER — Emergency Department
Admission: EM | Admit: 2022-06-02 | Discharge: 2022-06-04 | Disposition: A | Discharge status: Other Acute Inpatient Hospital | Payer: Medicare Other | Attending: Emergency Medicine | Admitting: Emergency Medicine

## 2022-06-02 ENCOUNTER — Encounter: Payer: Self-pay | Admitting: Emergency Medicine

## 2022-06-02 DIAGNOSIS — M1380 Other specified arthritis, unspecified site: Secondary | ICD-10-CM | POA: Insufficient documentation

## 2022-06-02 DIAGNOSIS — R519 Headache, unspecified: Secondary | ICD-10-CM | POA: Diagnosis not present

## 2022-06-02 DIAGNOSIS — R251 Tremor, unspecified: Secondary | ICD-10-CM | POA: Insufficient documentation

## 2022-06-02 DIAGNOSIS — F32A Depression, unspecified: Secondary | ICD-10-CM | POA: Diagnosis present

## 2022-06-02 DIAGNOSIS — R45851 Suicidal ideations: Secondary | ICD-10-CM | POA: Diagnosis not present

## 2022-06-02 DIAGNOSIS — K2270 Barrett's esophagus without dysplasia: Secondary | ICD-10-CM | POA: Diagnosis not present

## 2022-06-02 DIAGNOSIS — I2510 Atherosclerotic heart disease of native coronary artery without angina pectoris: Secondary | ICD-10-CM | POA: Diagnosis present

## 2022-06-02 DIAGNOSIS — G8929 Other chronic pain: Secondary | ICD-10-CM | POA: Diagnosis present

## 2022-06-02 DIAGNOSIS — R42 Dizziness and giddiness: Secondary | ICD-10-CM | POA: Diagnosis not present

## 2022-06-02 DIAGNOSIS — Z20822 Contact with and (suspected) exposure to covid-19: Secondary | ICD-10-CM | POA: Insufficient documentation

## 2022-06-02 DIAGNOSIS — Z9151 Personal history of suicidal behavior: Secondary | ICD-10-CM

## 2022-06-02 DIAGNOSIS — U071 COVID-19: Secondary | ICD-10-CM | POA: Diagnosis present

## 2022-06-02 DIAGNOSIS — G3184 Mild cognitive impairment, so stated: Secondary | ICD-10-CM | POA: Insufficient documentation

## 2022-06-02 DIAGNOSIS — Z8616 Personal history of COVID-19: Secondary | ICD-10-CM | POA: Insufficient documentation

## 2022-06-02 DIAGNOSIS — M1990 Unspecified osteoarthritis, unspecified site: Secondary | ICD-10-CM | POA: Diagnosis present

## 2022-06-02 DIAGNOSIS — F323 Major depressive disorder, single episode, severe with psychotic features: Secondary | ICD-10-CM | POA: Diagnosis present

## 2022-06-02 DIAGNOSIS — N390 Urinary tract infection, site not specified: Secondary | ICD-10-CM | POA: Insufficient documentation

## 2022-06-02 DIAGNOSIS — D509 Iron deficiency anemia, unspecified: Secondary | ICD-10-CM | POA: Insufficient documentation

## 2022-06-02 LAB — CBC WITH DIFFERENTIAL/PLATELET
Abs Immature Granulocytes: 0.02 10*3/uL (ref 0.00–0.07)
Basophils Absolute: 0.1 10*3/uL (ref 0.0–0.1)
Basophils Relative: 1 %
Eosinophils Absolute: 0.4 10*3/uL (ref 0.0–0.5)
Eosinophils Relative: 5 %
HCT: 40.6 % (ref 36.0–46.0)
HCT: 40.8 % (ref 36.0–46.0)
Hemoglobin: 12.9 g/dL (ref 12.0–15.0)
Hemoglobin: 13 g/dL (ref 12.0–15.0)
Immature Granulocytes: 0 %
Lymphocytes Relative: 39 %
Lymphs Abs: 3 10*3/uL (ref 0.7–4.0)
MCH: 28 pg (ref 26.0–34.0)
MCH: 28.3 pg (ref 26.0–34.0)
MCHC: 31.6 g/dL (ref 30.0–36.0)
MCHC: 32 g/dL (ref 30.0–36.0)
MCV: 88.5 fL (ref 80.0–100.0)
MCV: 88.5 fL (ref 80.0–100.0)
Monocytes Absolute: 0.6 10*3/uL (ref 0.1–1.0)
Monocytes Relative: 8 %
Neutro Abs: 3.6 10*3/uL (ref 1.7–7.7)
Neutrophils Relative %: 47 %
Platelets: 174 10*3/uL (ref 150–400)
Platelets: 188 10*3/uL (ref 150–400)
RBC: 4.59 MIL/uL (ref 3.87–5.11)
RBC: 4.61 MIL/uL (ref 3.87–5.11)
RDW: 16.6 % — ABNORMAL HIGH (ref 11.5–15.5)
RDW: 16.6 % — ABNORMAL HIGH (ref 11.5–15.5)
WBC: 7.8 10*3/uL (ref 4.0–10.5)
WBC: 7.8 10*3/uL (ref 4.0–10.5)
nRBC: 0 % (ref 0.0–0.2)
nRBC: 0 % (ref 0.0–0.2)

## 2022-06-02 LAB — URINE DRUG SCREEN, QUALITATIVE (ARMC ONLY)
Amphetamines, Ur Screen: NOT DETECTED
Barbiturates, Ur Screen: POSITIVE — AB
Benzodiazepine, Ur Scrn: NOT DETECTED
Cannabinoid 50 Ng, Ur ~~LOC~~: NOT DETECTED
Cocaine Metabolite,Ur ~~LOC~~: NOT DETECTED
MDMA (Ecstasy)Ur Screen: NOT DETECTED
Methadone Scn, Ur: NOT DETECTED
Opiate, Ur Screen: NOT DETECTED
Phencyclidine (PCP) Ur S: NOT DETECTED
Tricyclic, Ur Screen: NOT DETECTED

## 2022-06-02 LAB — COMPREHENSIVE METABOLIC PANEL
ALT: 13 U/L (ref 0–44)
AST: 20 U/L (ref 15–41)
Albumin: 4.2 g/dL (ref 3.5–5.0)
Alkaline Phosphatase: 64 U/L (ref 38–126)
Anion gap: 7 (ref 5–15)
BUN: 26 mg/dL — ABNORMAL HIGH (ref 8–23)
CO2: 26 mmol/L (ref 22–32)
Calcium: 9.7 mg/dL (ref 8.9–10.3)
Chloride: 102 mmol/L (ref 98–111)
Creatinine, Ser: 0.93 mg/dL (ref 0.44–1.00)
GFR, Estimated: 60 mL/min — ABNORMAL LOW (ref >60)
Glucose, Bld: 104 mg/dL — ABNORMAL HIGH (ref 70–99)
Potassium: 4.4 mmol/L (ref 3.5–5.1)
Sodium: 135 mmol/L (ref 135–145)
Total Bilirubin: 0.5 mg/dL (ref 0.3–1.2)
Total Protein: 7.5 g/dL (ref 6.5–8.1)

## 2022-06-02 LAB — CBC
HCT: 40.5 % (ref 36.0–46.0)
Hemoglobin: 12.9 g/dL (ref 12.0–15.0)
MCH: 28.4 pg (ref 26.0–34.0)
MCHC: 31.9 g/dL (ref 30.0–36.0)
MCV: 89 fL (ref 80.0–100.0)
Platelets: 160 10*3/uL (ref 150–400)
RBC: 4.55 MIL/uL (ref 3.87–5.11)
RDW: 16.6 % — ABNORMAL HIGH (ref 11.5–15.5)
WBC: 7.9 10*3/uL (ref 4.0–10.5)
nRBC: 0 % (ref 0.0–0.2)

## 2022-06-02 LAB — ETHANOL: Alcohol, Ethyl (B): <10 mg/dL (ref <10)

## 2022-06-02 LAB — ACETAMINOPHEN LEVEL: Acetaminophen (Tylenol), Serum: 27 ug/mL (ref 10–30)

## 2022-06-02 LAB — SALICYLATE LEVEL: Salicylate Lvl: <7 mg/dL — ABNORMAL LOW (ref 7.0–30.0)

## 2022-06-02 MED ORDER — ACETAMINOPHEN 500 MG PO TABS
1000.0000 mg | ORAL_TABLET | Freq: Once | ORAL | Status: AC
  Administered 2022-06-02: 1000 mg via ORAL
  Filled 2022-06-02: qty 2

## 2022-06-02 NOTE — ED Notes (Signed)
Pt refused sandwich tray and drink

## 2022-06-02 NOTE — ED Notes (Signed)
Call pts daughter, Maurine Minister [phone] with any questions, concerns, changes and status.  Pt lives with daughter.

## 2022-06-02 NOTE — ED Provider Notes (Signed)
[facility] Provider Note    Event Date/Time   First MD Initiated Contact with Patient 06/02/22 2208     (approximate)   History   Suicidal   HPI  Regina Hernandez is a 88 y.o. female  who presents to the emergency department today because of concerns for suicidal ideation.  Patient lives with her family.  She says that the family keeps the house to cold.  She does not like cold air.  Because of this she becomes quite upset.  Feels she is tired of it all.  Had some scissors today.  Was thinking about stabbing herself.  Did not actually cut or lacerated her self.  Says that she is attempted suicide once in the past. Does have medical complaint of headache, states she gets them frequently.   Physical Exam   Triage Vital Signs: ED Triage Vitals  Enc Vitals Group     BP 06/02/22 1920 (!) 169/122     Pulse Rate 06/02/22 1920 87     Resp 06/02/22 1920 18     Temp 06/02/22 1920 98.4 F (36.9 C)     Temp Source 06/02/22 1920 Oral     SpO2 06/02/22 1920 97 %     Weight 06/02/22 1920 155 lb (70.3 kg)     Height 06/02/22 1920 '5\' 3"'$  (1.6 m)     Head Circumference --      Peak Flow --      Pain Score 06/02/22 1924 0     Pain Loc --      Pain Edu? --      Excl. in [place]? --     Most recent vital signs: Vitals:   06/02/22 1920  BP: (!) 169/122  Pulse: 87  Resp: 18  Temp: 98.4 F (36.9 C)  SpO2: 97%   General: Awake, alert, oriented. CV:  Good peripheral perfusion. Regular rate and rhythm. Resp:  Normal effort.  Abd:  No distention.  Psych:  Depressed   ED Results / Procedures / Treatments   Labs (all labs ordered are listed, but only abnormal results are displayed) Labs Reviewed  COMPREHENSIVE METABOLIC PANEL - Abnormal; Notable for the following components:      Result Value   Glucose, Bld 104 (*)    BUN 26 (*)    GFR, Estimated 60 (*)    All other components within normal limits  SALICYLATE LEVEL - Abnormal; Notable for the following  components:   Salicylate Lvl <5.4 (*)    All other components within normal limits  CBC - Abnormal; Notable for the following components:   RDW 16.6 (*)    All other components within normal limits  URINE DRUG SCREEN, QUALITATIVE ([facility] ONLY) - Abnormal; Notable for the following components:   Barbiturates, Ur Screen POSITIVE (*)    All other components within normal limits  CBC WITH DIFFERENTIAL/PLATELET - Abnormal; Notable for the following components:   RDW 16.6 (*)    All other components within normal limits  CBC WITH DIFFERENTIAL/PLATELET - Abnormal; Notable for the following components:   RDW 16.6 (*)    All other components within normal limits  ETHANOL  ACETAMINOPHEN LEVEL  DIFFERENTIAL     EKG  None   RADIOLOGY None    PROCEDURES:  Critical Care performed: No  Procedures   MEDICATIONS ORDERED IN ED: Medications - No data to display   IMPRESSION / MDM / [place] / ED COURSE  I reviewed the triage vital  signs and the nursing notes.                              Differential diagnosis includes, but is not limited to, depression, anemia, electrolyte abnormality.  Patient's presentation is most consistent with acute presentation with potential threat to life or bodily function.  Patient presented to the emergency department today because of concerns for depression and suicidal ideation.  The time my exam patient does appear depressed.  She states she is tired of everything.  Did complain of some headache which she states she gets frequently.  Will give Tylenol for headache.  Will have psychiatry team evaluate.  The patient has been placed in psychiatric observation due to the need to provide a safe environment for the patient while obtaining psychiatric consultation and evaluation, as well as ongoing medical and medication management to treat the patient's condition.  The patient has not been placed under full IVC at this time.   FINAL CLINICAL  IMPRESSION(S) / ED DIAGNOSES   Final diagnoses:  Depression, unspecified depression type       Note:  This document was prepared using Dragon voice recognition software and may include unintentional dictation errors.    Nance Pear, MD 06/02/22 2240

## 2022-06-02 NOTE — ED Notes (Signed)
Pt dressed out by this RN & EDT Lattie Haw) belongings include:   1 pair of black shoes 1 pair of white socks 1 pink top 1 pair of pink pants 1 black mask 1 pair of tan underwear

## 2022-06-02 NOTE — ED Notes (Signed)
Per family, the patient has a DNR form at their house and they will have a family bring it to the hospital when they get time.

## 2022-06-02 NOTE — ED Triage Notes (Signed)
Pt presents via POV with her daughter following a suicide attempt. The patient lives with her daughter and she noted that the patient was hiding in the closet with a pair of scissors trying to stab her self. Pt made an attempt 2 years ago by overdosing on Ambien. Per daughter, the patient had been doing better but something "changed in her today". Pt cooperative in triage.

## 2022-06-03 ENCOUNTER — Other Ambulatory Visit: Payer: Self-pay

## 2022-06-03 DIAGNOSIS — F323 Major depressive disorder, single episode, severe with psychotic features: Secondary | ICD-10-CM | POA: Diagnosis not present

## 2022-06-03 LAB — URINALYSIS, COMPLETE (UACMP) WITH MICROSCOPIC
Bacteria, UA: NONE SEEN
Bilirubin Urine: NEGATIVE
Glucose, UA: NEGATIVE mg/dL
Hgb urine dipstick: NEGATIVE
Ketones, ur: NEGATIVE mg/dL
Leukocytes,Ua: NEGATIVE
Nitrite: NEGATIVE
Protein, ur: NEGATIVE mg/dL
Specific Gravity, Urine: 1.013 (ref 1.005–1.030)
pH: 6 (ref 5.0–8.0)

## 2022-06-03 LAB — SARS CORONAVIRUS 2 BY RT PCR: SARS Coronavirus 2 by RT PCR: NEGATIVE

## 2022-06-03 MED ORDER — ACETAMINOPHEN 500 MG PO TABS
500.0000 mg | ORAL_TABLET | Freq: Three times a day (TID) | ORAL | Status: DC | PRN
  Administered 2022-06-03: 500 mg via ORAL
  Filled 2022-06-03: qty 1

## 2022-06-03 MED ORDER — VENLAFAXINE HCL ER 75 MG PO CP24
75.0000 mg | ORAL_CAPSULE | Freq: Three times a day (TID) | ORAL | Status: DC
  Administered 2022-06-03 (×3): 75 mg via ORAL
  Filled 2022-06-03 (×5): qty 1

## 2022-06-03 MED ORDER — ARIPIPRAZOLE 2 MG PO TABS
2.0000 mg | ORAL_TABLET | Freq: Every morning | ORAL | Status: DC
  Administered 2022-06-03: 2 mg via ORAL
  Filled 2022-06-03 (×2): qty 1

## 2022-06-03 MED ORDER — NITROFURANTOIN MACROCRYSTAL 50 MG PO CAPS
100.0000 mg | ORAL_CAPSULE | Freq: Every day | ORAL | Status: DC
  Administered 2022-06-03: 100 mg via ORAL
  Filled 2022-06-03 (×2): qty 2

## 2022-06-03 MED ORDER — ASPIRIN 81 MG PO TBEC
81.0000 mg | DELAYED_RELEASE_TABLET | Freq: Every day | ORAL | Status: DC
  Administered 2022-06-03: 81 mg via ORAL
  Filled 2022-06-03: qty 1

## 2022-06-03 MED ORDER — GABAPENTIN 300 MG PO CAPS
400.0000 mg | ORAL_CAPSULE | Freq: Two times a day (BID) | ORAL | Status: DC
  Administered 2022-06-03 (×2): 400 mg via ORAL
  Filled 2022-06-03 (×2): qty 1

## 2022-06-03 MED ORDER — VITAMIN B-12 1000 MCG PO TABS
1000.0000 ug | ORAL_TABLET | Freq: Every day | ORAL | Status: DC
  Administered 2022-06-03: 1000 ug via ORAL
  Filled 2022-06-03 (×2): qty 1

## 2022-06-03 MED ORDER — PRIMIDONE 50 MG PO TABS
100.0000 mg | ORAL_TABLET | Freq: Every day | ORAL | Status: DC
  Administered 2022-06-03: 100 mg via ORAL
  Filled 2022-06-03 (×2): qty 2

## 2022-06-03 MED ORDER — MIDODRINE HCL 5 MG PO TABS: 5.0000 mg | ORAL_TABLET | Freq: Three times a day (TID) | ORAL | Status: DC

## 2022-06-03 MED ORDER — DOCUSATE SODIUM 100 MG PO CAPS
100.0000 mg | ORAL_CAPSULE | Freq: Every day | ORAL | Status: DC
  Administered 2022-06-03: 100 mg via ORAL
  Filled 2022-06-03: qty 1

## 2022-06-03 MED ORDER — VITAMIN D 25 MCG (1000 UNIT) PO TABS
1000.0000 [IU] | ORAL_TABLET | Freq: Every day | ORAL | Status: DC
  Administered 2022-06-03: 1000 [IU] via ORAL
  Filled 2022-06-03: qty 1

## 2022-06-03 MED ORDER — MELATONIN 5 MG PO TABS: 5.0000 mg | ORAL_TABLET | Freq: Every evening | ORAL | Status: DC | PRN

## 2022-06-03 MED ORDER — MEMANTINE HCL 5 MG PO TABS
10.0000 mg | ORAL_TABLET | Freq: Two times a day (BID) | ORAL | Status: DC
  Administered 2022-06-03 (×2): 10 mg via ORAL
  Filled 2022-06-03 (×2): qty 2

## 2022-06-03 MED ORDER — MIDODRINE HCL 5 MG PO TABS: 5.0000 mg | ORAL_TABLET | Freq: Three times a day (TID) | ORAL | Status: DC | PRN

## 2022-06-03 NOTE — ED Notes (Signed)
VOL/pending inpatient psych admission when medically cleared 

## 2022-06-03 NOTE — BH Assessment (Signed)
Referral information for Psychiatric Hospitalization re-faxed to;    [facility] [phone]- [phone]),    [facility] [phone]   [phone],   [phone],    [facility] [phone] [phone]   [facility]([phone],    [phone]- [phone],    [facility] ([phone]-- [phone])   [phone] [phone]    [phone]

## 2022-06-03 NOTE — ED Notes (Signed)
Hospital meal provided.  100% consumed, pt tolerated w/o complaints.  Waste discarded appropriately.   

## 2022-06-03 NOTE — ED Notes (Signed)
VOL  PENDING  PLACEMENT 

## 2022-06-03 NOTE — ED Notes (Signed)
Daughter visiting at bedside.

## 2022-06-03 NOTE — ED Notes (Addendum)
Pt is siting in the doorway, pt is refusing to go into the room because it is too cold. Pt asking to turn off the a/c, explained that we cant control the a/c in the room.

## 2022-06-03 NOTE — BH Assessment (Signed)
PATIENT BED AVAILABLE AFTER 8AM ON 06/04/22  Patient has been accepted to [facility].  Patient assigned to [facility] Accepting physician is Dr. Juliane Lack.  Call report to [phone].  Representative was [employer].   ER Staff is aware of it:  Scientist, forensic  Dr. Ellender Hose, ER MD  [name] Patient's Nurse

## 2022-06-03 NOTE — ED Notes (Signed)
Pt daughter brought lower dentures, glasses and DNR. DNR placed on chart.

## 2022-06-03 NOTE — ED Notes (Signed)
Pt refused snack and drink 

## 2022-06-03 NOTE — BH Assessment (Signed)
Referral information for Psychiatric Hospitalization faxed to:  [facility] [phone]),   [facility] [phone]) [phone]  [facility]([phone],  [facility]([phone]-or- [phone],   [phone]  [facility]([phone] [phone],   [phone]

## 2022-06-03 NOTE — Consult Note (Signed)
[facility] Face-to-Face Psychiatry Consult   Reason for Consult: Suicidal Referring Physician: Dr. Archie Balboa Patient Identification: Regina Hernandez MRN:  [id number] Principal Diagnosis: <principal problem not specified> Diagnosis:  Active Problems:   Depression   Arthritis   Chronic headaches   Coronary atherosclerosis   Barrett's esophagus without dysplasia   History of suicide attempt   Iron deficiency anemia   Major depression with psychotic features (HCC)   Recurrent UTI   Tremor   Vertigo   Mild cognitive impairment   COVID-19   Total Time spent with patient: 1 hour  Subjective: "I am being picked on by my daughter."  Regina Hernandez is a 88 y.o. female patient presented to [facility] ED via [place] with her daughter voluntarily following a suicide attempt. Per the ED triage nurses note, Pt presents via POV with her daughter following a suicide attempt. The patient lives with her daughter and she noted that the patient was hiding in the closet with a pair of scissors trying to stab her self. Pt made an attempt 2 years ago by overdosing on Ambien. Per daughter, the patient had been doing better but something "changed in her today". Pt cooperative in triage.   This provider saw The patient face-to-face; the chart was reviewed, and consulted with Dr. Archie Balboa on 06/02/2022 due to the patient's care. It was discussed with the EDP that the patient does meet the criteria to be admitted to the geriatric-psychiatric inpatient unit. On evaluation, the patient is alert and oriented x 4, angry, anxious but cooperative, and mood-congruent with affect. The patient does not appear to be responding to internal or external stimuli. The patient is presenting with some delusional thinking. That her daughters "hate her" The patient denies auditory or visual hallucinations. The patient admits to suicidal ideation. The patient shared, "I don't know why he doesn't just let me die. I am willing to trade places with a child with  cancer or some illness. I which they can let that child live and let me die." The patient denies homicidal or self-harm ideations. The patient is not presenting with any psychotic or paranoid behaviors. During an encounter with the patient, she could answer questions appropriately.  HPI: Per Dr. Archie Balboa, Regina Hernandez is a 88 y.o. female  who presents to the emergency department today because of concerns for suicidal ideation.  Patient lives with her family.  She says that the family keeps the house to cold.  She does not like cold air.  Because of this she becomes quite upset.  Feels she is tired of it all.  Had some scissors today.  Was thinking about stabbing herself.  Did not actually cut or lacerated her self.  Says that she is attempted suicide once in the past. Does have medical complaint of headache, states she gets them frequently.   Past Psychiatric History:  Anxiety  Risk to Self:   Risk to Others:   Prior Inpatient Therapy:   Prior Outpatient Therapy:    Past Medical History:  Past Medical History:  Diagnosis Date   Anxiety    Cervical dystonia    Essential tremor    GI bleed    Memory loss    Migraine    Occipital neuralgia     Past Surgical History:  Procedure Laterality Date   BOWEL RESECTION     CARDIAC CATHETERIZATION  2016   no stents/[facility]   ESOPHAGOGASTRODUODENOSCOPY N/A 06/18/2020   Procedure: ESOPHAGOGASTRODUODENOSCOPY (EGD);  Surgeon: Alice Reichert, Benay Pike, MD;  Location:  [facility] ENDOSCOPY;  Service: Gastroenterology;  Laterality: N/A;   PACEMAKER IMPLANT     REPLACEMENT TOTAL KNEE Left    Family History:  Family History  Problem Relation Age of Onset   Other Mother        "old age"   Ulcers Father    Stomach cancer Brother    Family Psychiatric  History:  Social History:  Social History   Substance and Sexual Activity  Alcohol Use Not Currently     Social History   Substance and Sexual Activity  Drug Use Never    Social History    Socioeconomic History   Marital status: Widowed    Spouse name: Not on file   Number of children: 5   Years of education: 12th   Highest education level: High school graduate  Occupational History   Occupation: Retired  Tobacco Use   Smoking status: Never    Passive exposure: Never   Smokeless tobacco: Never  Vaping Use   Vaping Use: Never used  Substance and Sexual Activity   Alcohol use: Not Currently   Drug use: Never   Sexual activity: Not Currently  Other Topics Concern   Not on file  Social History Narrative   Lives at home with her daughters.   Right-handed.   Two cups caffeine per day.      02/10/21   From: [place] - moved to [facility] 2004   Living: with daughter [name] and Ronny Bacon granddaughter and [name] (daughter)   Work: retired - daycare       Family: Lives with daughters - [name] and [name], 3 other children across the country - too many to count ~15, a few great-grandchildren      Enjoys: play cards, word search books, watch TV, relaxing outside      Exercise: not currently   Diet: good appetite, cauliflower, chicken, likes most things      Safety   Seat belts: Yes    Guns: No   Safe in relationships: Yes    Social Determinants of Radio broadcast assistant Strain: Not on file  Food Insecurity: Not on file  Transportation Needs: Not on file  Physical Activity: Not on file  Stress: Not on file  Social Connections: Not on file   Additional Social History:    Allergies:   Allergies  Allergen Reactions   Adhesive [Tape] Other (See Comments)    Contact dermatitis   Baclofen Rash   Lyrica [Pregabalin] Other (See Comments)    Unable to remember reaction.   Sulfa Antibiotics Rash    Labs:  Results for orders placed or performed during the hospital encounter of 06/02/22 (from the past 48 hour(s))  Comprehensive metabolic panel     Status: Abnormal   Collection Time: 06/02/22  7:21 PM  Result Value Ref Range   Sodium 135 135 - 145 mmol/L    Potassium 4.4 3.5 - 5.1 mmol/L   Chloride 102 98 - 111 mmol/L   CO2 26 22 - 32 mmol/L   Glucose, Bld 104 (H) 70 - 99 mg/dL    Comment: Glucose reference range applies only to samples taken after fasting for at least 8 hours.   BUN 26 (H) 8 - 23 mg/dL   Creatinine, Ser 0.93 0.44 - 1.00 mg/dL   Calcium 9.7 8.9 - 10.3 mg/dL   Total Protein 7.5 6.5 - 8.1 g/dL   Albumin 4.2 3.5 - 5.0 g/dL   AST 20 15 - 41 U/L   ALT  13 0 - 44 U/L   Alkaline Phosphatase 64 38 - 126 U/L   Total Bilirubin 0.5 0.3 - 1.2 mg/dL   GFR, Estimated 60 (L) >60 mL/min    Comment: (NOTE) Calculated using the CKD-EPI Creatinine Equation (2021)    Anion gap 7 5 - 15    Comment: Performed at [facility], [address] [place], [place] [address]  Ethanol     Status: None   Collection Time: 06/02/22  7:21 PM  Result Value Ref Range   Alcohol, Ethyl (B) <10 <10 mg/dL    Comment: (NOTE) Lowest detectable limit for serum alcohol is 10 mg/dL.  For medical purposes only. Performed at [facility], [address] [place], [place] [address]   Salicylate level     Status: Abnormal   Collection Time: 06/02/22  7:21 PM  Result Value Ref Range   Salicylate Lvl <6.7 (L) 7.0 - 30.0 mg/dL    Comment: Performed at [facility], [address] [place], [place] [address]  Acetaminophen level     Status: None   Collection Time: 06/02/22  7:21 PM  Result Value Ref Range   Acetaminophen (Tylenol), Serum 27 10 - 30 ug/mL    Comment: (NOTE) Therapeutic concentrations vary significantly. A range of 10-30 ug/mL  may be an effective concentration for many patients. However, some  are best treated at concentrations outside of this range. Acetaminophen concentrations >150 ug/mL at 4 hours after ingestion  and >50 ug/mL at 12 hours after ingestion are often associated with  toxic reactions.  Performed at [facility], [address] [place], [place] [address]   cbc     Status: Abnormal    Collection Time: 06/02/22  7:21 PM  Result Value Ref Range   WBC 7.9 4.0 - 10.5 K/uL   RBC 4.55 3.87 - 5.11 MIL/uL   Hemoglobin 12.9 12.0 - 15.0 g/dL   HCT 40.5 36.0 - 46.0 %   MCV 89.0 80.0 - 100.0 fL   MCH 28.4 26.0 - 34.0 pg   MCHC 31.9 30.0 - 36.0 g/dL   RDW 16.6 (H) 11.5 - 15.5 %   Platelets 160 150 - 400 K/uL   nRBC 0.0 0.0 - 0.2 %    Comment: Performed at [facility], [address] [place], [place] [address]  Urine Drug Screen, Qualitative     Status: Abnormal   Collection Time: 06/02/22  7:21 PM  Result Value Ref Range   Tricyclic, Ur Screen NONE DETECTED NONE DETECTED   Amphetamines, Ur Screen NONE DETECTED NONE DETECTED   MDMA (Ecstasy)Ur Screen NONE DETECTED NONE DETECTED   Cocaine Metabolite,Ur [place] NONE DETECTED NONE DETECTED   Opiate, Ur Screen NONE DETECTED NONE DETECTED   Phencyclidine (PCP) Ur S NONE DETECTED NONE DETECTED   Cannabinoid 50 Ng, Ur  NONE DETECTED NONE DETECTED   Barbiturates, Ur Screen POSITIVE (A) NONE DETECTED   Benzodiazepine, Ur Scrn NONE DETECTED NONE DETECTED   Methadone Scn, Ur NONE DETECTED NONE DETECTED    Comment: (NOTE) Tricyclics + metabolites, urine    Cutoff 1000 ng/mL Amphetamines + metabolites, urine  Cutoff 1000 ng/mL MDMA (Ecstasy), urine              Cutoff 500 ng/mL Cocaine Metabolite, urine          Cutoff 300 ng/mL Opiate + metabolites, urine        Cutoff 300 ng/mL Phencyclidine (PCP), urine  Cutoff 25 ng/mL Cannabinoid, urine                 Cutoff 50 ng/mL Barbiturates + metabolites, urine  Cutoff 200 ng/mL Benzodiazepine, urine              Cutoff 200 ng/mL Methadone, urine                   Cutoff 300 ng/mL  The urine drug screen provides only a preliminary, unconfirmed analytical test result and should not be used for non-medical purposes. Clinical consideration and professional judgment should be applied to any positive drug screen result due to possible interfering substances. A more specific  alternate chemical method must be used in order to obtain a confirmed analytical result. Gas chromatography / mass spectrometry (GC/MS) is the preferred confirm atory method. Performed at [facility], [address] [place], [place] [address]   CBC with Differential/Platelet     Status: Abnormal   Collection Time: 06/02/22  7:21 PM  Result Value Ref Range   WBC 7.8 4.0 - 10.5 K/uL   RBC 4.61 3.87 - 5.11 MIL/uL   Hemoglobin 12.9 12.0 - 15.0 g/dL   HCT 40.8 36.0 - 46.0 %   MCV 88.5 80.0 - 100.0 fL   MCH 28.0 26.0 - 34.0 pg   MCHC 31.6 30.0 - 36.0 g/dL   RDW 16.6 (H) 11.5 - 15.5 %   Platelets 188 150 - 400 K/uL   nRBC 0.0 0.0 - 0.2 %    Comment: Performed at [facility], [address] [place], [place] [address]   Neutrophils Relative % ORDER MODIFIED OR RESCHEDULED %   Neutro Abs ORDER MODIFIED OR RESCHEDULED 1.7 - 7.7 K/uL   Band Neutrophils ORDER MODIFIED OR RESCHEDULED %   Lymphocytes Relative ORDER MODIFIED OR RESCHEDULED %   Lymphs Abs ORDER MODIFIED OR RESCHEDULED 0.7 - 4.0 K/uL   Monocytes Relative ORDER MODIFIED OR RESCHEDULED %   Monocytes Absolute ORDER MODIFIED OR RESCHEDULED 0.1 - 1.0 K/uL   Eosinophils Relative ORDER MODIFIED OR RESCHEDULED %   Eosinophils Absolute ORDER MODIFIED OR RESCHEDULED 0.0 - 0.5 K/uL   Basophils Relative ORDER MODIFIED OR RESCHEDULED %   Basophils Absolute ORDER MODIFIED OR RESCHEDULED 0.0 - 0.1 K/uL   WBC Morphology ORDER MODIFIED OR RESCHEDULED    RBC Morphology ORDER MODIFIED OR RESCHEDULED    Smear Review ORDER MODIFIED OR RESCHEDULED    Other ORDER MODIFIED OR RESCHEDULED %   nRBC ORDER MODIFIED OR RESCHEDULED 0 /100 WBC   Metamyelocytes Relative ORDER MODIFIED OR RESCHEDULED %   Myelocytes ORDER MODIFIED OR RESCHEDULED %   Promyelocytes Relative ORDER MODIFIED OR RESCHEDULED %   Blasts ORDER MODIFIED OR RESCHEDULED %   Immature Granulocytes ORDER MODIFIED OR RESCHEDULED %   Abs Immature Granulocytes ORDER  MODIFIED OR RESCHEDULED 0.00 - 0.07 K/uL  CBC with Differential/Platelet     Status: Abnormal   Collection Time: 06/02/22  7:21 PM  Result Value Ref Range   WBC 7.8 4.0 - 10.5 K/uL   RBC 4.59 3.87 - 5.11 MIL/uL   Hemoglobin 13.0 12.0 - 15.0 g/dL   HCT 40.6 36.0 - 46.0 %   MCV 88.5 80.0 - 100.0 fL   MCH 28.3 26.0 - 34.0 pg   MCHC 32.0 30.0 - 36.0 g/dL   RDW 16.6 (H) 11.5 - 15.5 %   Platelets 174 150 - 400 K/uL   nRBC 0.0 0.0 - 0.2 %   Neutrophils  Relative % 47 %   Neutro Abs 3.6 1.7 - 7.7 K/uL   Lymphocytes Relative 39 %   Lymphs Abs 3.0 0.7 - 4.0 K/uL   Monocytes Relative 8 %   Monocytes Absolute 0.6 0.1 - 1.0 K/uL   Eosinophils Relative 5 %   Eosinophils Absolute 0.4 0.0 - 0.5 K/uL   Basophils Relative 1 %   Basophils Absolute 0.1 0.0 - 0.1 K/uL   Immature Granulocytes 0 %   Abs Immature Granulocytes 0.02 0.00 - 0.07 K/uL    Comment: Performed at [facility], [address] [place], [place] [address]    No current facility-administered medications for this encounter.   Current Outpatient Medications  Medication Sig Dispense Refill   acetaminophen (TYLENOL) 500 MG tablet Take 2 tablets (1,000 mg total) by mouth every 8 (eight) hours as needed for mild pain or moderate pain. 30 tablet 0   ARIPiprazole (ABILIFY) 2 MG tablet Take 2 mg by mouth every morning.     aspirin EC 81 MG tablet Take 81 mg by mouth daily. Swallow whole.     Cholecalciferol (VITAMIN D3 PO) Take 25 mcg by mouth daily.     diphenhydrAMINE-APAP, sleep, (TYLENOL PM EXTRA STRENGTH PO) Take 500 mg by mouth at bedtime.     docusate sodium (COLACE) 100 MG capsule Take 100 mg by mouth daily.     fluvoxaMINE (LUVOX) 50 MG tablet Take 50 mg by mouth at bedtime.     gabapentin (NEURONTIN) 400 MG capsule Take 1 capsule (400 mg total) by mouth 2 (two) times daily. 180 capsule 3   melatonin 5 MG TABS Take 5 mg by mouth.     memantine (NAMENDA) 10 MG tablet Take 1 tablet (10 mg total) by mouth 2 (two) times  daily. 180 tablet 3   midodrine (PROAMATINE) 5 MG tablet Take 1 tablet (5 mg total) by mouth 3 (three) times daily with meals. 270 tablet 3   nitrofurantoin (MACRODANTIN) 100 MG capsule Take 1 capsule (100 mg total) by mouth daily. 90 capsule 2   primidone (MYSOLINE) 50 MG tablet Take 100 mg by mouth at bedtime.     venlafaxine XR (EFFEXOR-XR) 75 MG 24 hr capsule Take 75 mg by mouth 3 (three) times daily.     vitamin B-12 (CYANOCOBALAMIN) 1000 MCG tablet Take 1,000 mcg by mouth daily.     estradiol (ESTRACE) 0.1 MG/GM vaginal cream Place 1 Applicatorful vaginally as needed. (Patient not taking: Reported on 06/02/2022)      Musculoskeletal: Strength & Muscle Tone: within normal limits Gait & Station: normal Patient leans: N/A  Psychiatric Specialty Exam:  Presentation  General Appearance: Appropriate for Environment  Eye Contact:Fair  Speech:Clear and Coherent  Speech Volume:Normal  Handedness:Right   Mood and Affect  Mood:Anxious; Irritable; Depressed  Affect:Depressed; Inappropriate   Thought Process  Thought Processes:Coherent  Descriptions of Associations:Intact  Orientation:Full (Time, Place and Person)  Thought Content:Logical  History of Schizophrenia/Schizoaffective disorder:No data recorded Duration of Psychotic Symptoms:No data recorded Hallucinations:Hallucinations: None  Ideas of Reference:None  Suicidal Thoughts:Suicidal Thoughts: Yes, Active SI Active Intent and/or Plan: With Intent; With Plan; With Means to [place]; With Access to Means  Homicidal Thoughts:Homicidal Thoughts: No   Sensorium  Memory:Immediate Fair; Recent Fair; Remote Fair  Judgment:Poor  Insight:Poor   Executive Functions  Concentration:No data recorded Attention Span:No data recorded Speed   Psychomotor Activity  Psychomotor Activity:Psychomotor Activity: Normal   Assets  Assets:Communication Skills; Desire for  Improvement; Physical Health; Resilience; Social Support   Sleep  Sleep:Sleep: Good   Physical Exam: Physical Exam Vitals and nursing note reviewed.  Constitutional:      Appearance: Normal appearance. She is normal weight.  HENT:     Head: Normocephalic and atraumatic.     Right Ear: External ear normal.     Left Ear: External ear normal.     Nose: Nose normal.  Cardiovascular:     Rate and Rhythm: Normal rate.     Pulses: Normal pulses.  Pulmonary:     Effort: Pulmonary effort is normal.  Musculoskeletal:        General: Normal range of motion.     Cervical back: Normal range of motion and neck supple.  Neurological:     Mental Status: She is alert and oriented to person, place, and time. Mental status is at baseline.  Psychiatric:        Attention and Perception: Attention normal.        Mood and Affect: Mood is anxious and depressed. Affect is angry and inappropriate.        Speech: Speech is tangential.        Behavior: Behavior is cooperative.        Thought Content: Thought content includes suicidal ideation. Thought content includes suicidal plan.        Cognition and Memory: Cognition and memory normal.        Judgment: Judgment is impulsive and inappropriate.   Review of Systems  Psychiatric/Behavioral:  Positive for depression and suicidal ideas. The patient is nervous/anxious.   All other systems reviewed and are negative. Blood pressure (!) 169/122, pulse 87, temperature 98.4 F (36.9 C), temperature source Oral, resp. rate 18, height '5\' 3"'$  (1.6 m), weight 70.3 kg, SpO2 97 %. Body mass index is 27.46 kg/m.  Treatment Plan Summary: Medication management and Patient does meet criteria for geriatric-psychiatric inpatient admission  Disposition: Recommend psychiatric Inpatient admission when medically cleared. Supportive therapy provided about ongoing stressors.  Caroline Sauger, NP 06/03/2022 12:22 AM

## 2022-06-03 NOTE — BH Assessment (Addendum)
Referral checks;    [facility] ([phone]- [phone]),    [facility] [phone]) [phone] Currently under review with [employer]([phone],   FMikel Cella([phone],    [facility]([phone]   [facility]([phone],    [facility]([phone]-or- [phone], THelyn [facility] patient has been denied   [facility] ([phone]-- [phone])   ThBoykin [phone]    RoMayer [phone]

## 2022-06-03 NOTE — BH Assessment (Signed)
Comprehensive Clinical Assessment (CCA) Note  06/03/2022 Regina Hernandez [id number] Recommendations for Services/Supports/Treatments: Consulted with Lynder Parents., NP, who determined pt. meets inpatient psychiatric criteria. Notified Dr. Archie Balboa and Leonette Most, RN of disposition recommendation. Regina Hernandez is an 88 year old, English speaking, Caucasian female with PMH hx of recurrent major depression and anxiety. Per triage note: Pt presents via POV with her daughter following a suicide attempt. The patient lives with her daughter and she noted that the patient was hiding in the closet with a pair of scissors trying to stab herself. Pt made an attempt 2 years ago by overdosing on Ambien. Per daughter, the patient had been doing better but something "changed in her today". Upon assessment pt. when pt. was asked why she presented to the ED, the pt. stated, "I'm tired of the bologna at home." Pt identified being cold and being picked on by her live-in daughter and granddaughter as her main stressors. Pt reported that they purposely turn on the air, but later identified that she'd also forgotten to turn on her space heater. Pt continued to endorse suicidal thoughts stating, "I ask myself daily, why am I here" and expressed that she is tired of living. Pt reported that she has felt this way for a while. Pt was oriented x4 and had impaired reality testing. Pt admitted to being isolative and feeling overwhelming feelings of hopelessness. Pt's rationale for her actions included believing that she feels in the way and is always complaining. Pt was anxious throughout the assessment. Pt had impaired concentration and clear/coherent speech. Pt had poor insight and impaired judgment. Pt had a hopeless, worthless mood and a flat affect. Pt denied HI, A/VH.   Chief Complaint:  Chief Complaint  Patient presents with   Suicidal   Principal Diagnosis: <principal problem not specified> Visit Diagnosis: Depression   Arthritis    Chronic headaches   Coronary atherosclerosis   Barrett's esophagus without dysplasia   History of suicide attempt   Iron deficiency anemia   Major depression with psychotic features ([facility])   Recurrent UTI   Tremor   Vertigo   Mild cognitive impairment   COVID-19      CCA Screening, Triage and Referral (STR)  Patient Reported Information How did you hear about [place]? Family/Friend  Referral name: No data recorded Referral phone number: No data recorded  Whom do you see for routine medical problems? No data recorded Practice/Facility Name: No data recorded Practice/Facility Phone Number: No data recorded Name of Contact: No data recorded Contact Number: No data recorded Contact Fax Number: No data recorded Prescriber Name: No data recorded Prescriber Address (if known): No data recorded  What Is the Reason for Your Visit/Call Today? Pt presents via POV with her daughter following a suicide attempt. The patient lives with her daughter and she noted that the patient was hiding in the closet with a pair of scissors trying to stab her self. Pt made an attempt 2 years ago by overdosing on Ambien. Per daughter, the patient had been doing better but something "changed in her today".  How Long Has This Been Causing You Problems? > than 6 months  What Do You Feel Would Help You the Most Today? Treatment for Depression or other mood problem   Have You Recently Been in Any Inpatient Treatment (Hospital/Detox/Crisis Center/28-Day Program)? No data recorded Name/Location of Program/Hospital:No data recorded How Long Were You There? No data recorded When Were You Discharged? No data recorded  Have You Ever Received Services From [facility] Before?  No data recorded Who Do You See at [facility]? No data recorded  Have You Recently Had Any Thoughts About Hurting Yourself? Yes  Are You Planning to Commit Suicide/Harm Yourself At This time? Yes   Have you Recently Had Thoughts About  Hurting Someone Gu[place]upe Dawn? No  Explanation: No data recorded  Have You Used Any Alcohol or Drugs in the Past 24 Hours? No  How Long Ago Did You Use Drugs or Alcohol? No data recorded What Did You Use and How Much? No data recorded  Do You Currently Have a Therapist/Psychiatrist? No  Name of Therapist/Psychiatrist: No data recorded  Have You Been Recently Discharged From Any Office Practice or Programs? No  Explanation of Discharge From Practice/Program: No data recorded    CCA Screening Triage Referral Assessment Type of Contact: Face-to-Face  Is this Initial or Reassessment? No data recorded Date Telepsych consult ordered in [facility]:  No data recorded Time Telepsych consult ordered in [facility]:  No data recorded  Patient Reported Information Reviewed? No data recorded Patient Left Without Being Seen? No data recorded Reason for Not Completing Assessment: No data recorded  Collateral Involvement: None provided   Does Patient Have a [place]? No data recorded Name and Contact of Legal Guardian: No data recorded If Minor and Not Living with Parent(s), Who has Custody? n/a  Is CPS involved or ever been involved? Never  Is APS involved or ever been involved? Never   Patient Determined To Be At Risk for Harm To Self or Others Based on Review of Patient Reported Information or Presenting Complaint? Yes, for Self-Harm  Method: No data recorded Availability of Means: No data recorded Intent: No data recorded Notification Required: No data recorded Additional Information for Danger to Others Potential: No data recorded Additional Comments for Danger to Others Potential: No data recorded Are There Guns or Other Weapons in Your Home? No data recorded Types of Guns/Weapons: No data recorded Are These Weapons Safely Secured?                            No data recorded Who Could Verify You Are Able To Have These Secured: No data recorded Do You Have any Outstanding  Charges, Pending Court Dates, Parole/Probation? No data recorded Contacted To Inform of Risk of Harm To Self or Others: No data recorded  Location of Assessment: [facility]   Does Patient Present under Involuntary Commitment? No  IVC Papers Initial File Date: No data recorded  [place] of Residence: [place]   Patient Currently Receiving the Following Services: Medication Management   Determination of Need: Emergent (2 hours)   Options For Referral: Inpatient Hospitalization     CCA Biopsychosocial Intake/Chief Complaint:  No data recorded Current Symptoms/Problems: No data recorded  Patient Reported Schizophrenia/Schizoaffective Diagnosis in Past: No   Strengths: Pt is able to ask for health, pt has a supportive family, pt has stable housing.  Preferences: No data recorded Abilities: No data recorded  Type of Services Patient Feels are Needed: No data recorded  Initial Clinical Notes/Concerns: No data recorded  Mental Health Symptoms Depression:   Hopelessness; Worthlessness; Difficulty Concentrating; Irritability   Duration of Depressive symptoms:  Greater than two weeks   Mania:   None   Anxiety:    Worrying; Tension; Difficulty concentrating   Psychosis:   None   Duration of Psychotic symptoms: No data recorded  Trauma:   Detachment from others; Guilt/shame; Hypervigilance  Obsessions:   N/A   Compulsions:   None   Inattention:   None   Hyperactivity/Impulsivity:   None   Oppositional/Defiant Behaviors:   Easily annoyed   Emotional Irregularity:   Potentially harmful impulsivity; Recurrent suicidal behaviors/gestures/threats; Transient, stress-related paranoia/disassociation   Other Mood/Personality Symptoms:  No data recorded   Mental Status Exam Appearance and self-care  Stature:   Average   Weight:   Average weight   Clothing:   -- (In scrubs)   Grooming:   Normal   Cosmetic use:   None   Posture/gait:   Normal    Motor activity:   Not Remarkable   Sensorium  Attention:   Distractible   Concentration:   Anxiety interferes   Orientation:   Object; Person; Place; Situation   Recall/memory:   Normal   Affect and Mood  Affect:   Congruent; Appropriate   Mood:   Depressed; Hopeless; Pessimistic; Worthless   Relating  Eye contact:   Normal   Facial expression:   Depressed   Attitude toward examiner:   Cooperative   Thought and Language  Speech flow:  Clear and Coherent   Thought content:   Appropriate to Mood and Circumstances   Preoccupation:   Ruminations   Hallucinations:   None   Organization:  No data recorded  [employer] of Knowledge:   Average   Intelligence:   Average   Abstraction:   Normal   Judgement:   Poor   Reality Testing:   Variable   Insight:   Poor   Decision Making:   Impulsive   Social Functioning  Social Maturity:   Isolates   Social Judgement:   Heedless   Stress  Stressors:   Family conflict   Coping Ability:   Deficient supports; Exhausted   Skill Deficits:   Decision making; Self-control   Supports:   Family; Support needed     Religion: Religion/Spirituality Are You A Religious Person?:  (Not assessed)  Leisure/Recreation:    Exercise/Diet: Exercise/Diet Do You Exercise?: No Have You Gained or Lost A Significant Amount of Weight in the Past Six Months?: No Do You Follow a Special Diet?: No Do You Have Any Trouble Sleeping?: No   CCA Employment/Education Employment/Work Situation: Employment / Work Nurse, children's Situation: Retired Social research officer, government has Been Impacted by Current Illness: No Has Patient ever Been in Passenger transport manager?: No  Education: Education Is Patient Currently Attending School?: No Did You Nutritional therapist?:  (UTA) Did You Have An Individualized Education Program (IIEP):  (UTA) Did You Have Any Difficulty At School?:  (UTA) Patient's Education Has Been  Impacted by Current Illness: No   CCA Family/Childhood History Family and Relationship History: Family history Marital status: Widowed Widowed, when?: Not assessed Does patient have children?: Yes How many children?: 5 How is patient's relationship with their children?: Pt reported that her relationship with her live in daughter is strained. Pt expressed that her feelings are often invalidated by her other children.  Childhood History:  Childhood History By whom was/is the patient raised?: Both parents Did patient suffer any verbal/emotional/physical/sexual abuse as a child?: No Did patient suffer from severe childhood neglect?: No Has patient ever been sexually abused/assaulted/raped as an adolescent or adult?: No Was the patient ever a victim of a crime or a disaster?: No Witnessed domestic violence?: No Has patient been affected by domestic violence as an adult?: No  Child/Adolescent Assessment:     CCA Substance Use Alcohol/Drug Use: Alcohol / Drug  Use Pain Medications: See MAR Prescriptions: See MAR Over the Counter: See MAR History of alcohol / drug use?: No history of alcohol / drug abuse Longest period of sobriety (when/how long): N/A                         ASAM's:  Six Dimensions of Multidimensional Assessment  Dimension 1:  Acute Intoxication and/or Withdrawal Potential:      Dimension 2:  Biomedical Conditions and Complications:      Dimension 3:  Emotional, Behavioral, or Cognitive Conditions and Complications:     Dimension 4:  Readiness to Change:     Dimension 5:  Relapse, Continued use, or Continued Problem Potential:     Dimension 6:  Recovery/Living Environment:     ASAM Severity Score:    ASAM Recommended Level of Treatment:     Substance use Disorder (SUD)    Recommendations for Services/Supports/Treatments:    DSM5 Diagnoses: Patient Active Problem List   Diagnosis Date Noted   COVID-19 12/25/2021   Mild cognitive impairment  10/27/2021   Nonintractable headache 10/27/2021   History of stomach ulcers 08/01/2020   Diarrhea 07/21/2020   Ventricular tachycardia (HCC) 06/22/2020   Syncopal episodes 06/21/2020   Orthostatic hypotension 06/21/2020   Melena 06/17/2020   Essential tremor    Arachnoid cyst 02/11/2020   Chronic headaches 02/11/2020   Coccydynia 02/11/2020   H/O staphylococcal infection 02/11/2020   Migraine 02/11/2020   Vertigo 02/11/2020   Mild cognitive impairment with memory loss 02/04/2020   Gait abnormality 02/04/2020   Depression 02/04/2020   Major depression with psychotic features ([place]) 10/31/2019   History of suicide attempt 10/30/2019   Iron deficiency anemia 05/22/2019   Status post left knee replacement 11/20/2018   Pain of cervical facet joint 07/13/2018   Arthritis 06/01/2018   Tremor 06/01/2018   Spondylosis without myelopathy or radiculopathy, cervical region 01/19/2017   Diverticulosis of large intestine without hemorrhage 11/30/2016   Stage 3 chronic kidney disease ([place]) 11/30/2016   Plantar fasciitis of left foot 09/29/2016   Left knee pain 11/26/2014   Polyp of colon 11/01/2013   Hypertension 03/01/2012   Pacemaker 01/26/2012   History of endocarditis 09/15/2011   Coronary atherosclerosis 08/17/2011   Sick sinus syndrome ([place]) 08/17/2011   Barrett's esophagus without dysplasia 07/20/2010   Microscopic hematuria 07/20/2010   Recurrent UTI 07/20/2010   Vestibulitis, vulvar 07/20/2010   Vania Rosero R Macedonio Scallon, LCAS

## 2022-06-04 NOTE — ED Notes (Signed)
[facility]  Dept  called  for   transport  to  [facility]

## 2022-06-04 NOTE — ED Notes (Signed)
IVC/Accepted to [facility] Acute Unit

## 2022-06-04 NOTE — ED Notes (Signed)
Daughter brought suitcase of clothes for patient when transferred to [facility].

## 2022-06-04 NOTE — ED Notes (Signed)
Report called to [facility] to Jodie Echevaria, Therapist, sports.

## 2022-06-04 NOTE — ED Notes (Signed)
EMTALA review by charge RN

## 2022-06-04 NOTE — ED Provider Notes (Addendum)
Emergency Medicine Observation Re-evaluation Note  Mayar Whittier is a 88 y.o. female, seen on rounds today.  Pt initially presented to the ED for complaints of Suicidal Currently, the patient is resting.  Physical Exam  BP (!) 152/102   Pulse 98   Temp 98.2 F (36.8 C) (Oral)   Resp 15   Ht '5\' 3"'$  (1.6 m)   Wt 70.3 kg   SpO2 98%   BMI 27.46 kg/m  Physical Exam Gen: No acute distress  Resp: Normal rise and fall of chest Neuro: Moving all four extremities Psych: Resting currently, calm and cooperative when awake    ED Course / MDM  EKG:   I have reviewed the labs performed to date as well as medications administered while in observation.  Recent changes in the last 24 hours include no acute events overnight.  Plan  Current plan is for transfer to [facility] in the morning accepted by Dr. Darene Lamer Roehrich is under involuntary commitment.      Jahmeir Geisen, Delice Bison, DO 06/04/22 [place], Delice Bison, DO 06/04/22 [phone]

## 2022-07-05 ENCOUNTER — Encounter: Payer: Self-pay | Admitting: Cardiology

## 2022-07-12 ENCOUNTER — Other Ambulatory Visit: Payer: Self-pay

## 2022-07-12 MED ORDER — AMLODIPINE BESYLATE 2.5 MG PO TABS: 2.5000 mg | ORAL_TABLET | Freq: Every day | ORAL | 0 refills | Status: DC

## 2022-07-12 NOTE — Progress Notes (Signed)
See MyChart message for Dr. Thereasa Solo recommendation to refill Norvasc 2.5 MG QD for 30 days with no refills til office visit.

## 2022-07-19 ENCOUNTER — Encounter: Payer: Self-pay | Admitting: Medical

## 2022-07-19 ENCOUNTER — Ambulatory Visit (INDEPENDENT_AMBULATORY_CARE_PROVIDER_SITE_OTHER): Payer: Medicare Other | Admitting: Medical

## 2022-07-19 VITALS — BP 160/106 | HR 68 | Ht 63.0 in | Wt 154.2 lb

## 2022-07-19 DIAGNOSIS — I495 Sick sinus syndrome: Secondary | ICD-10-CM

## 2022-07-19 DIAGNOSIS — I951 Orthostatic hypotension: Secondary | ICD-10-CM | POA: Diagnosis not present

## 2022-07-19 DIAGNOSIS — I10 Essential (primary) hypertension: Secondary | ICD-10-CM

## 2022-07-19 MED ORDER — AMLODIPINE BESYLATE 2.5 MG PO TABS: 2.5000 mg | ORAL_TABLET | Freq: Every day | ORAL | 1 refills | Status: DC

## 2022-07-19 NOTE — Progress Notes (Signed)
Cardiology Office Note:    Date:  07/19/2022   ID:  Regina Hernandez, DOB 1934/11/16, MRN [id number]  PCP:  Lesleigh Noe, MD  [facility] Cardiologist:  None  [facility] Electrophysiologist:  None   Referring MD: Lesleigh Noe, MD   Chief Complaint: f/u post-psych hospitalization  History of Present Illness:    Regina Hernandez is a 88 y.o. female with a hx of anxiety, bradycardia, orthostatic hypotension, SSS s/p PPM (medtronic 203) who presents for follow-up.   LHC in 2012 at [place] showed minimal irregularities in the LAD and RCA.   She was previously seen for orthostatic hypotension and was started on midodrine '5mg'$  TID. Symptoms had resolved with midodrine.   Echo 03/2022 showed LVEF 60-65%, mild aortic sclerosis with no stenosis.   Recently seen in the ER 6/9 23 for complaints of suicidal. Plan was to transfer to [place]. Midodrine was stopped.   Today, the patient reported she tried to end her life and this is why she was admitted for 2 weeks in [place]. Said her BP was too high and midodrine was stopped. She was also having headaches. She was started on amlodipine mg 2.'5mg'$  daily for BP control. Headaches seemed to improve after this. She denies chest pain or SOB. She is living with family, and is overall feeling much better. She is filling her time with activities. No lower leg edema, orthopnea, pnd.   Past Medical History:  Diagnosis Date   Anxiety    Cervical dystonia    Essential tremor    GI bleed    Memory loss    Migraine    Occipital neuralgia     Past Surgical History:  Procedure Laterality Date   BOWEL RESECTION     CARDIAC CATHETERIZATION  2016   no stents/[facility]   ESOPHAGOGASTRODUODENOSCOPY N/A 06/18/2020   Procedure: ESOPHAGOGASTRODUODENOSCOPY (EGD);  Surgeon: Toledo, Benay Pike, MD;  Location: [facility] ENDOSCOPY;  Service: Gastroenterology;  Laterality: N/A;   PACEMAKER IMPLANT     REPLACEMENT TOTAL KNEE Left     Current  Medications: Current Meds  Medication Sig   acetaminophen (TYLENOL) 500 MG tablet Take 2 tablets (1,000 mg total) by mouth every 8 (eight) hours as needed for mild pain or moderate pain.   ARIPiprazole (ABILIFY) 2 MG tablet Take 2 mg by mouth every morning.   aspirin EC 81 MG tablet Take 81 mg by mouth daily. Swallow whole.   Cholecalciferol (VITAMIN D3 PO) Take 25 mcg by mouth daily.   diphenhydrAMINE-APAP, sleep, (TYLENOL PM EXTRA STRENGTH PO) Take 500 mg by mouth at bedtime.   docusate sodium (COLACE) 100 MG capsule Take 100 mg by mouth daily.   estradiol (ESTRACE) 0.1 MG/GM vaginal cream Place 1 Applicatorful vaginally as needed.   fluvoxaMINE (LUVOX) 50 MG tablet Take 50 mg by mouth at bedtime.   gabapentin (NEURONTIN) 400 MG capsule Take 1 capsule (400 mg total) by mouth 2 (two) times daily.   melatonin 5 MG TABS Take 5 mg by mouth.   memantine (NAMENDA) 10 MG tablet Take 1 tablet (10 mg total) by mouth 2 (two) times daily.   nitrofurantoin (MACRODANTIN) 100 MG capsule Take 1 capsule (100 mg total) by mouth daily.   primidone (MYSOLINE) 50 MG tablet Take 100 mg by mouth at bedtime.   venlafaxine XR (EFFEXOR-XR) 75 MG 24 hr capsule Take 75 mg by mouth 3 (three) times daily.   vitamin B-12 (CYANOCOBALAMIN) 1000 MCG tablet Take 1,000 mcg by mouth daily.   [  DISCONTINUED] amLODipine (NORVASC) 2.5 MG tablet Take 1 tablet (2.5 mg total) by mouth daily.     Allergies:   Adhesive [tape], Baclofen, Lyrica [pregabalin], and Sulfa antibiotics   Social History   Socioeconomic History   Marital status: Widowed    Spouse name: Not on file   Number of children: 5   Years of education: 12th   Highest education level: High school graduate  Occupational History   Occupation: Retired  Tobacco Use   Smoking status: Never    Passive exposure: Never   Smokeless tobacco: Never  Vaping Use   Vaping Use: Never used  Substance and Sexual Activity   Alcohol use: Not Currently   Drug use: Never    Sexual activity: Not Currently  Other Topics Concern   Not on file  Social History Narrative   Lives at home with her daughters.   Right-handed.   Two cups caffeine per day.      02/10/21   From: [place] - moved to [facility] 2004   Living: with daughter Regina Hernandez and Regina Hernandez granddaughter and Regina Hernandez (daughter)   Work: retired - daycare       Family: Lives with daughters - Regina Hernandez and Regina Hernandez, 3 other children across the country - too many to count ~15, a few great-grandchildren      Enjoys: play cards, word search books, watch TV, relaxing outside      Exercise: not currently   Diet: good appetite, cauliflower, chicken, likes most things      Safety   Seat belts: Yes    Guns: No   Safe in relationships: Yes    Social Determinants of Radio broadcast assistant Strain: Not on file  Food Insecurity: Not on file  Transportation Needs: Not on file  Physical Activity: Not on file  Stress: Not on file  Social Connections: Not on file     Family History: The patient's family history includes Other in her mother; Stomach cancer in her brother; Ulcers in her father.  ROS:   Please see the history of present illness.     All other systems reviewed and are negative.  EKGs/Labs/Other Studies Reviewed:    The following studies were reviewed today:  Echo 03/2022  1. Left ventricular ejection fraction, by estimation, is 60 to 65%. The  left ventricle has normal function. The left ventricle has no regional  wall motion abnormalities. Left ventricular diastolic parameters are  consistent with Grade I diastolic  dysfunction (impaired relaxation).   2. Right ventricular systolic function is normal. The right ventricular  size is normal.   3. The mitral valve is normal in structure. Trivial mitral valve  regurgitation.   4. The aortic valve was not well visualized. Aortic valve regurgitation  is not visualized. Aortic valve sclerosis/calcification is present,  without any evidence of aortic  stenosis.   5. The inferior vena cava is normal in size with greater than 50%  respiratory variability, suggesting right atrial pressure of 3 mmHg.   Comparison(s): LVEF 60-65%.   EKG:  EKG is  ordered today.  The ekg ordered today demonstrates A paced rhythm 68bpm, LAD, inferior q waves  Recent Labs: 06/02/2022: ALT 13; BUN 26; Creatinine, Ser 0.93; Hemoglobin 12.9; Hemoglobin 12.9; Hemoglobin 13.0; Platelets 160; Platelets 188; Platelets 174; Potassium 4.4; Sodium 135  Recent Lipid Panel    Component Value Date/Time   CHOL 225 (A) 07/23/2021 0000   TRIG 102 07/23/2021 0000   HDL 99 (A) 07/23/2021 0000  [place] 106 07/23/2021 0000     Physical Exam:    VS:  BP (!) 160/106 (BP Location: Left Arm, Patient Position: Sitting, Cuff Size: Normal)   Pulse 68   Ht '5\' 3"'$  (1.6 m)   Wt 154 lb 3.2 oz (69.9 kg)   SpO2 98%   BMI 27.32 kg/m     Wt Readings from Last 3 Encounters:  07/19/22 154 lb 3.2 oz (69.9 kg)  06/02/22 155 lb (70.3 kg)  05/10/22 156 lb (70.8 kg)     GEN:  Well nourished, well developed in no acute distress HEENT: Normal NECK: No JVD; No carotid bruits LYMPHATICS: No lymphadenopathy CARDIAC: RRR, no murmurs, rubs, gallops RESPIRATORY:  Clear to auscultation without rales, wheezing or rhonchi  ABDOMEN: Soft, non-tender, non-distended MUSCULOSKELETAL:  No edema; No deformity  SKIN: Warm and dry NEUROLOGIC:  Alert and oriented x 3 PSYCHIATRIC:  Normal affect   ASSESSMENT:    1. Orthostatic hypotension   2. Essential hypertension   3. Sinus node dysfunction (HCC)    PLAN:    In order of problems listed above:  HTN H/o Orthostatic hypotension During most recent psych hospitalization, BP readings were consistently high and midodrine was stopped. She also was having headaches. She was started on amlodipine 2.'5mg'$  daily and is overall feeling better. Orthostatics are negative today. BP is on the higher side, 160/106. She denies dizziness or lightheadedness. We  will refill amlodipine 2.'5mg'$  daily. I am weary of escalation of amlodipine due to history of orthostasis. We will see her back in a month for BP check.   H/o bradycardia SSS s/p PPM Last device check showed a normally functioning device. She is followed by Dr. Caryl Comes.  Disposition: Follow up in 1 month(s) with MD/APP    Signed, Katelen Luepke Ninfa Meeker, PA-C  07/19/2022 11:38 AM    [facility]

## 2022-07-19 NOTE — Patient Instructions (Signed)
Medication Instructions:   1) STOP/ (continue to stay off of) midodrine  2) CONTINUE Amlodipine 2.5 mg: - take 1 tablet by mouth once daily  This has been refilled at the pharmacy  *If you need a refill on your cardiac medications before your next appointment, please call your pharmacy*   Lab Work: - none ordered  If you have labs (blood work) drawn today and your tests are completely normal, you will receive your results only by: [place] (if you have MyChart) OR A paper copy in the mail If you have any lab test that is abnormal or we need to change your treatment, we will call you to review the results.   Testing/Procedures: - none ordered   Follow-Up: At [facility], you and your health needs are our priority.  As part of our continuing mission to provide you with exceptional heart care, we have created designated Provider Care Teams.  These Care Teams include your primary Cardiologist (physician) and Advanced Practice Providers (APPs -  Physician Assistants and Nurse Practitioners) who all work together to provide you with the care you need, when you need it.  We recommend signing up for the patient portal called "MyChart".  Sign up information is provided on this After Visit Summary.  MyChart is used to connect with patients for Virtual Visits (Telemedicine).  Patients are able to view lab/test results, encounter notes, upcoming appointments, etc.  Non-urgent messages can be sent to your provider as well.   To learn more about what you can do with MyChart, go to [email or web address].    Your next appointment:   1 month(s)  The format for your next appointment:   In Person  Provider:   You may see Kate Sable, MD or one of the following Advanced Practice Providers on your designated Care Team:    Cadence Kathlen Mody, [place]    Other Instructions N/a  Important Information About Sugar

## 2022-08-11 ENCOUNTER — Ambulatory Visit (INDEPENDENT_AMBULATORY_CARE_PROVIDER_SITE_OTHER): Payer: Medicare Other | Admitting: Family Medicine

## 2022-08-11 ENCOUNTER — Encounter: Payer: Self-pay | Admitting: Family Medicine

## 2022-08-11 VITALS — BP 128/80 | HR 75 | Temp 97.3°F | Ht 62.5 in | Wt 155.4 lb

## 2022-08-11 DIAGNOSIS — G3184 Mild cognitive impairment, so stated: Secondary | ICD-10-CM

## 2022-08-11 DIAGNOSIS — E782 Mixed hyperlipidemia: Secondary | ICD-10-CM | POA: Diagnosis not present

## 2022-08-11 DIAGNOSIS — Z0000 Encounter for general adult medical examination without abnormal findings: Secondary | ICD-10-CM

## 2022-08-11 LAB — LIPID PANEL
Cholesterol: 203 mg/dL — ABNORMAL HIGH (ref 0–200)
HDL: 89.3 mg/dL (ref >39.00)
LDL Cholesterol: 96 mg/dL (ref 0–99)
NonHDL: 113.31
Total CHOL/HDL Ratio: 2
Triglycerides: 87 mg/dL (ref 0.0–149.0)
VLDL: 17.4 mg/dL (ref 0.0–40.0)

## 2022-08-11 NOTE — Progress Notes (Signed)
Subjective:   Regina Hernandez is a 88 y.o. female who presents for Medicare Annual (Subsequent) preventive examination.  Review of Systems    Review of Systems  Constitutional:  Negative for chills and fever.  HENT:  Negative for congestion and sore throat.   Eyes:  Negative for blurred vision and double vision.  Respiratory:  Negative for shortness of breath.   Cardiovascular:  Negative for chest pain.  Gastrointestinal:  Negative for heartburn, nausea and vomiting.  Genitourinary: Negative.   Musculoskeletal: Negative.  Negative for myalgias.  Skin:  Negative for rash.  Neurological:  Negative for dizziness and headaches.  Endo/Heme/Allergies:  Does not bruise/bleed easily.  Psychiatric/Behavioral:  Negative for depression. The patient is not nervous/anxious.     Cardiac Risk Factors include: advanced age (>56mn, >>46women);dyslipidemia;hypertension;sedentary lifestyle     Objective:    Today's Vitals   08/11/22 1121  BP: 128/80  Pulse: 75  Temp: (!) 97.3 F (36.3 C)  TempSrc: Temporal  SpO2: 97%  Weight: 155 lb 6 oz (70.5 kg)  Height: 5' 2.5" (1.588 m)   Body mass index is 27.97 kg/m.     08/11/2022   11:45 AM 06/02/2022    7:24 PM 08/10/2021    3:03 PM 07/22/2020    8:00 PM 07/21/2020   11:05 AM 07/01/2020    9:52 AM 06/22/2020    4:45 AM  Advanced Directives  Does Patient Have a Medical Advance Directive? Yes No Yes  No Yes Yes  Type of Advance Directive Healthcare Power of Attorney Living will;Out of facility DNR (pink MOST or yellow form) [place]   Living will;Healthcare Power of [place]  Does patient want to make changes to medical advance directive? No - Patient declined  No - Patient declined   No - Patient declined No - Patient declined  Copy of [place] Chart? No - copy requested  No - copy requested   No - copy requested   Would patient like information on  creating a medical advance directive?    No - Patient declined  No - Patient declined     Current Medications (verified) Outpatient Encounter Medications as of 08/11/2022  Medication Sig   acetaminophen (TYLENOL) 500 MG tablet Take 2 tablets (1,000 mg total) by mouth every 8 (eight) hours as needed for mild pain or moderate pain.   amLODipine (NORVASC) 2.5 MG tablet Take 1 tablet (2.5 mg total) by mouth daily.   ARIPiprazole (ABILIFY) 2 MG tablet Take 2 mg by mouth every morning.   aspirin EC 81 MG tablet Take 81 mg by mouth daily. Swallow whole.   Cholecalciferol (VITAMIN D3 PO) Take 25 mcg by mouth daily.   docusate sodium (COLACE) 100 MG capsule Take 100 mg by mouth daily.   estradiol (ESTRACE) 0.1 MG/GM vaginal cream Place 1 Applicatorful vaginally as needed.   fluvoxaMINE (LUVOX) 50 MG tablet Take 50 mg by mouth at bedtime.   gabapentin (NEURONTIN) 400 MG capsule Take 1 capsule (400 mg total) by mouth 2 (two) times daily.   melatonin 5 MG TABS Take 5 mg by mouth.   memantine (NAMENDA) 10 MG tablet Take 1 tablet (10 mg total) by mouth 2 (two) times daily.   nitrofurantoin (MACRODANTIN) 100 MG capsule Take 1 capsule (100 mg total) by mouth daily.   primidone (MYSOLINE) 50 MG tablet Take 100 mg by mouth at bedtime.   venlafaxine XR (EFFEXOR-XR) 75 MG 24  hr capsule Take 75 mg by mouth 3 (three) times daily.   vitamin B-12 (CYANOCOBALAMIN) 1000 MCG tablet Take 1,000 mcg by mouth daily.   [DISCONTINUED] diphenhydrAMINE-APAP, sleep, (TYLENOL PM EXTRA STRENGTH PO) Take 500 mg by mouth at bedtime.   No facility-administered encounter medications on file as of 08/11/2022.    Allergies (verified) Adhesive [tape], Baclofen, Lyrica [pregabalin], and Sulfa antibiotics   History: Past Medical History:  Diagnosis Date   Anxiety    Cervical dystonia    Essential tremor    GI bleed    Memory loss    Migraine    Occipital neuralgia    Past Surgical History:  Procedure Laterality Date    BOWEL RESECTION     CARDIAC CATHETERIZATION  2016   no stents/[facility]   ESOPHAGOGASTRODUODENOSCOPY N/A 06/18/2020   Procedure: ESOPHAGOGASTRODUODENOSCOPY (EGD);  Surgeon: Toledo, Benay Pike, MD;  Location: [facility] ENDOSCOPY;  Service: Gastroenterology;  Laterality: N/A;   PACEMAKER IMPLANT     REPLACEMENT TOTAL KNEE Left    Family History  Problem Relation Age of Onset   Other Mother        "old age"   Ulcers Father    Stomach cancer Brother    Social History   Socioeconomic History   Marital status: Widowed    Spouse name: Not on file   Number of children: 5   Years of education: 12th   Highest education level: High school graduate  Occupational History   Occupation: Retired  Tobacco Use   Smoking status: Never    Passive exposure: Never   Smokeless tobacco: Never  Vaping Use   Vaping Use: Never used  Substance and Sexual Activity   Alcohol use: Not Currently   Drug use: Never   Sexual activity: Not Currently  Other Topics Concern   Not on file  Social History Narrative   Lives at home with her daughters.   Right-handed.   Two cups caffeine per day.      02/10/21   From: [place] - moved to [facility] 2004   Living: with daughter [name] and Ronny Bacon granddaughter and [name] (daughter)   Work: retired - daycare       Family: Lives with daughters - [name] and [name], 3 other children across the country - too many to count ~15, a few great-grandchildren      Enjoys: play cards, word search books, watch TV, relaxing outside      Exercise: not currently   Diet: good appetite, cauliflower, chicken, likes most things      Safety   Seat belts: Yes    Guns: No   Safe in relationships: Yes    Social Determinants of Radio broadcast assistant Strain: Not on file  Food Insecurity: Not on file  Transportation Needs: Not on file  Physical Activity: Not on file  Stress: Not on file  Social Connections: Not on file    Tobacco Counseling Counseling given: Not  Answered   Clinical Intake:  Pre-visit preparation completed: No  Pain : No/denies pain     BMI - recorded: 27.97 Nutritional Status: BMI 25 -29 Overweight Nutritional Risks: None Diabetes: No  How often do you need to have someone help you when you read instructions, pamphlets, or other written materials from your doctor or pharmacy?: 3 - Sometimes  Diabetic?no  Interpreter Needed?: No      Activities of Daily Living    08/11/2022   11:46 AM  In your present state of health,  do you have any difficulty performing the following activities:  Hearing? 0  Vision? 0  Difficulty concentrating or making decisions? 1  Walking or climbing stairs? 1  Dressing or bathing? 0  Doing errands, shopping? 0  Preparing Food and eating ? N  Using the Toilet? N  In the past six months, have you accidently leaked urine? Y  Comment during UTI  Do you have problems with loss of bowel control? N  Managing your Medications? Y  Managing your Finances? Y  Housekeeping or managing your Housekeeping? Y    Patient Care Team: Lesleigh Noe, MD as PCP - General (Family Medicine) Marcial Pacas, MD as Consulting Physician (Neurology) Kate Sable, MD as Consulting Physician (Cardiology) Festus Aloe, MD as Consulting Physician (Urology) Norma Fredrickson, MD as Consulting Physician (Psychiatry)  Indicate any recent Medical Services you may have received from other than [facility] providers in the past year (date may be approximate).     Assessment:   This is a routine wellness examination for [place].  Hearing/Vision screen Hearing Screening - Comments:: No concerns Vision Screening - Comments:: See eye dr at [facility] on [address].   Dietary issues and exercise activities discussed: Current Exercise Habits: The patient does not participate in regular exercise at present, Exercise limited by: orthopedic condition(s)   Goals Addressed             This Visit's Progress     Exercise 3x per week (30 min per time)       Stay active      Depression Screen    08/10/2021    3:27 PM 08/10/2021    3:04 PM 02/10/2021    2:34 PM 06/27/2020   11:47 AM 02/11/2020    9:21 AM  PHQ 2/9 Scores  PHQ - 2 Score 0 0 0 0 0  PHQ- 9 Score 1   1     Fall Risk    08/11/2022   11:18 AM 08/10/2021    2:51 PM 06/27/2020   11:47 AM 02/11/2020    9:21 AM  [place] in the past year? 0 1 0 0  Number falls in past yr: 0 0 0   Injury with Fall?  0 0   Risk for fall due to : Impaired balance/gait       FALL RISK PREVENTION PERTAINING TO THE HOME:  Any stairs in or around the home? No  If so, are there any without handrails?  N/a Home free of loose throw rugs in walkways, pet beds, electrical cords, etc? Yes  Adequate lighting in your home to reduce risk of falls? Yes   ASSISTIVE DEVICES UTILIZED TO PREVENT FALLS:  Life alert? No  Use of a cane, walker or w/c? Yes  Grab bars in the bathroom? Yes  Shower chair or bench in shower? Yes  Elevated toilet seat or a handicapped toilet? Yes   Cognitive Function:    08/11/2022   11:50 AM 04/22/2021    9:52 AM 10/22/2020   10:10 AM 02/04/2020    1:36 PM  MMSE - Mini Mental State Exam  Orientation to time '5 4 5 4  '$ Orientation to Place '5 5 5 4  '$ Registration 3 0 3 3  Attention/ Calculation 5 1 0 5  Recall '3 3 3 2  '$ Language- name 2 objects '2 2 2 2  '$ Language- repeat '1 1 1 1  '$ Language- follow 3 step command '3 3 3 3  '$ Language- read & follow  direction '1 1 1 1  '$ Write a sentence '1 1 1 1  '$ Copy design 0 '1 1 1  '$ Total score '29 22 25 27        '$ Immunizations Immunization History  Administered Date(s) Administered   Fluad Quad(high Dose 65+) 09/10/2020   Influenza Split 09/26/2012   Influenza, High Dose Seasonal PF 10/15/2011, 10/16/2013, 10/22/2014, 12/01/2015, 11/30/2016, 12/01/2017, 09/23/2018   Influenza,inj,Quad PF,6+ Mos 08/28/2019   Influenza-Unspecified 09/26/2012, 11/30/2016, 12/01/2017, 12/12/2017   Moderna  SARS-COV2 Booster Vaccination 03/28/2021   Moderna Sars-Covid-2 Vaccination 01/08/2020, 02/05/2020, 10/18/2020, 03/28/2021   PNEUMOCOCCAL CONJUGATE-20 08/10/2021   Pneumococcal Polysaccharide-23 09/26/2009   Tdap 09/28/2017    TDAP status: Up to date  Flu Vaccine status: Up to date  Pneumococcal vaccine status: Up to date  Covid-19 vaccine status: Completed vaccines  Qualifies for Shingles Vaccine? Yes   Zostavax completed No   Shingrix Completed?: No.    Education has been provided regarding the importance of this vaccine. Patient has been advised to call insurance company to determine out of pocket expense if they have not yet received this vaccine. Advised may also receive vaccine at local pharmacy or Health Dept. Verbalized acceptance and understanding.  Screening Tests Health Maintenance  Topic Date Due   Zoster Vaccines- Shingrix (1 of 2) Never done   DEXA SCAN  Never done   COVID-19 Vaccine (5 - Moderna series) 05/23/2021   INFLUENZA VACCINE  07/27/2022   TETANUS/TDAP  09/29/2027   Pneumonia Vaccine 31+ Years old  Completed   HPV VACCINES  Aged Out    Health Maintenance  Health Maintenance Due  Topic Date Due   Zoster Vaccines- Shingrix (1 of 2) Never done   DEXA SCAN  Never done   COVID-19 Vaccine (5 - Moderna series) 05/23/2021   INFLUENZA VACCINE  07/27/2022    Colorectal cancer screening: No longer required.   Mammogram status: No longer required due to age.  Bone density - declined  Lung Cancer Screening: (Low Dose CT Chest recommended if Age 30-80 years, 30 pack-year currently smoking OR have quit w/in 15years.) does not qualify.   Lung Cancer Screening Referral: n/a  Additional Screening:  Hepatitis C Screening: does qualify; completed  Vision Screening: Recommended annual ophthalmology exams for early detection of glaucoma and other disorders of the eye. Is the patient up to date with their annual eye exam?  Yes    Dental Screening:  Recommended annual dental exams for proper oral hygiene  Community Resource Referral / Chronic Care Management: CRR required this visit?  No   CCM required this visit?  No      Plan:     I have personally reviewed and noted the following in the patient's chart:   Medical and social history Use of alcohol, tobacco or illicit drugs  Current medications and supplements including opioid prescriptions.  Functional ability and status Nutritional status Physical activity Advanced directives List of other physicians Hospitalizations, surgeries, and ER visits in previous 12 months Vitals Screenings to include cognitive, depression, and falls Referrals and appointments  In addition, I have reviewed and discussed with patient certain preventive protocols, quality metrics, and best practice recommendations. A written personalized care plan for preventive services as well as general preventive health recommendations were provided to patient.     Lesleigh Noe, MD   08/11/2022

## 2022-08-11 NOTE — Patient Instructions (Addendum)
Shingles vaccine - at the pharmacy   Would recommend the following for bone health:   1) 800 units of Vitamin D daily 2) Get 1200 mg of elemental calcium --- this is best from your diet. Try to track how much calcium you get on a typical day. You could find ways to add more (dairy products, leafy greens). Take a supplement for whatever you don't typically get so you reach 1200 mg of calcium.  3) Physical activity (ideally weight bearing) - like walking briskly 30 minutes 5 days a week.    Ok to try Voltaren gel on the hands

## 2022-08-12 ENCOUNTER — Encounter: Payer: Self-pay | Admitting: Family Medicine

## 2022-08-12 DIAGNOSIS — E785 Hyperlipidemia, unspecified: Secondary | ICD-10-CM

## 2022-08-12 DIAGNOSIS — I2510 Atherosclerotic heart disease of native coronary artery without angina pectoris: Secondary | ICD-10-CM

## 2022-08-12 MED ORDER — ATORVASTATIN CALCIUM 10 MG PO TABS: 10.0000 mg | ORAL_TABLET | Freq: Every day | ORAL | 1 refills | Status: DC

## 2022-08-16 ENCOUNTER — Encounter: Payer: Self-pay | Admitting: Urology

## 2022-08-16 ENCOUNTER — Ambulatory Visit (INDEPENDENT_AMBULATORY_CARE_PROVIDER_SITE_OTHER): Payer: Medicare Other | Admitting: Urology

## 2022-08-16 VITALS — Ht 62.0 in | Wt 155.0 lb

## 2022-08-16 DIAGNOSIS — N3020 Other chronic cystitis without hematuria: Secondary | ICD-10-CM | POA: Diagnosis not present

## 2022-08-16 DIAGNOSIS — I2510 Atherosclerotic heart disease of native coronary artery without angina pectoris: Secondary | ICD-10-CM | POA: Diagnosis not present

## 2022-08-16 DIAGNOSIS — N3946 Mixed incontinence: Secondary | ICD-10-CM | POA: Diagnosis not present

## 2022-08-16 LAB — URINALYSIS, COMPLETE
Bilirubin, UA: NEGATIVE
Glucose, UA: NEGATIVE
Ketones, UA: NEGATIVE
Leukocytes,UA: NEGATIVE
Nitrite, UA: NEGATIVE
Protein,UA: NEGATIVE
Specific Gravity, UA: <1.005 — ABNORMAL LOW (ref 1.005–1.030)
Urobilinogen, Ur: 0.2 mg/dL (ref 0.2–1.0)
pH, UA: 6 (ref 5.0–7.5)

## 2022-08-16 LAB — MICROSCOPIC EXAMINATION: Bacteria, UA: NONE SEEN

## 2022-08-16 MED ORDER — NITROFURANTOIN MACROCRYSTAL 100 MG PO CAPS: 100.0000 mg | ORAL_CAPSULE | Freq: Every day | ORAL | 3 refills | Status: DC

## 2022-08-16 MED ORDER — NITROFURANTOIN MACROCRYSTAL 100 MG PO CAPS: 100.0000 mg | ORAL_CAPSULE | Freq: Every day | ORAL | 2 refills | Status: DC

## 2022-08-16 NOTE — Progress Notes (Signed)
08/16/2022 9:41 AM   Regina Hernandez 1934/11/18 [id number]  Referring provider: Lesleigh Noe, MD [place],  [place] [id number]  Chief Complaint  Patient presents with   Follow-up   Hematuria    HPI: Follow-up for overactive bladder and asymptomatic bacteriuria.  The patient is not having infections.  Wears 1 pad a day.  Wants to stop the medication and live with the problem.  She is pleased with her overall bladder function  Urine sent for culture.  I likely will not treat due to lack of symptoms.  She is known to have asymptomatic bacteriuria.  Stop Myrbetriq   I last saw the patient March 15, 2022.  She did have a positive culture March 15, 2022.  She had a small amount of blood April 01, 2022.  She had a positive culture at this time in April and then another positive culture in May 2023.  Normal renal ultrasound 2021   Clinically not infected today.  No burning or frequency.  Cystoscopy: Patient underwent flexible cystoscopy.  Bladder mucosa and trigone were normal.  No stitch foreign body or carcinoma.  Patient has never smoked    We talked about recurrent urinary tract infections and the role of prophylaxis Reassess in 3 months on Macrodantin 100 mg 3x11 call if culture positive  Today Frequency stable.  Last culture negative Infection free and very pleased on daily Macrodantin.  Clinically not infected.    PMH: Past Medical History:  Diagnosis Date   Anxiety    Cervical dystonia    Essential tremor    GI bleed    Memory loss    Migraine    Occipital neuralgia     Surgical History: Past Surgical History:  Procedure Laterality Date   BOWEL RESECTION     CARDIAC CATHETERIZATION  2016   no stents/[facility]   ESOPHAGOGASTRODUODENOSCOPY N/A 06/18/2020   Procedure: ESOPHAGOGASTRODUODENOSCOPY (EGD);  Surgeon: Toledo, Benay Pike, MD;  Location: [facility] ENDOSCOPY;  Service: Gastroenterology;  Laterality: N/A;   PACEMAKER IMPLANT      REPLACEMENT TOTAL KNEE Left     Home Medications:  Allergies as of 08/16/2022       Reactions   Adhesive [tape] Other (See Comments)   Contact dermatitis   Baclofen Rash   Lyrica [pregabalin] Other (See Comments)   Unable to remember reaction.   Sulfa Antibiotics Rash        Medication List        Accurate as of August 16, 2022  9:41 AM. If you have any questions, ask your nurse or doctor.          acetaminophen 500 MG tablet Commonly known as: TYLENOL Take 2 tablets (1,000 mg total) by mouth every 8 (eight) hours as needed for mild pain or moderate pain.   amLODipine 2.5 MG tablet Commonly known as: Norvasc Take 1 tablet (2.5 mg total) by mouth daily.   ARIPiprazole 2 MG tablet Commonly known as: ABILIFY Take 2 mg by mouth every morning.   aspirin EC 81 MG tablet Take 81 mg by mouth daily. Swallow whole.   atorvastatin 10 MG tablet Commonly known as: LIPITOR Take 1 tablet (10 mg total) by mouth daily.   cyanocobalamin 1000 MCG tablet Commonly known as: VITAMIN B12 Take 1,000 mcg by mouth daily.   docusate sodium 100 MG capsule Commonly known as: COLACE Take 100 mg by mouth daily.   estradiol 0.1 MG/GM vaginal cream Commonly known as: ESTRACE Place  1 Applicatorful vaginally as needed.   fluvoxaMINE 50 MG tablet Commonly known as: LUVOX Take 50 mg by mouth at bedtime.   gabapentin 400 MG capsule Commonly known as: NEURONTIN Take 1 capsule (400 mg total) by mouth 2 (two) times daily.   melatonin 5 MG Tabs Take 5 mg by mouth.   memantine 10 MG tablet Commonly known as: NAMENDA Take 1 tablet (10 mg total) by mouth 2 (two) times daily.   nitrofurantoin 100 MG capsule Commonly known as: Macrodantin Take 1 capsule (100 mg total) by mouth daily.   primidone 50 MG tablet Commonly known as: MYSOLINE Take 100 mg by mouth at bedtime.   venlafaxine XR 75 MG 24 hr capsule Commonly known as: EFFEXOR-XR Take 75 mg by mouth 3 (three) times daily.    VITAMIN D3 PO Take 25 mcg by mouth daily.        Allergies:  Allergies  Allergen Reactions   Adhesive [Tape] Other (See Comments)    Contact dermatitis   Baclofen Rash   Lyrica [Pregabalin] Other (See Comments)    Unable to remember reaction.   Sulfa Antibiotics Rash    Family History: Family History  Problem Relation Age of Onset   Other Mother        "old age"   Ulcers Father    Stomach cancer Brother     Social History:  reports that she has never smoked. She has never been exposed to tobacco smoke. She has never used smokeless tobacco. She reports that she does not currently use alcohol. She reports that she does not use drugs.  ROS:                                        Physical Exam: There were no vitals taken for this visit.  Constitutional:  Alert and oriented, No acute distress. HEENT: [place] AT, moist mucus membranes.  Trachea midline, no   Laboratory Data: Lab Results  Component Value Date   WBC 7.9 06/02/2022   WBC 7.8 06/02/2022   WBC 7.8 06/02/2022   HGB 12.9 06/02/2022   HGB 12.9 06/02/2022   HGB 13.0 06/02/2022   HCT 40.5 06/02/2022   HCT 40.8 06/02/2022   HCT 40.6 06/02/2022   MCV 89.0 06/02/2022   MCV 88.5 06/02/2022   MCV 88.5 06/02/2022   PLT 160 06/02/2022   PLT 188 06/02/2022   PLT 174 06/02/2022    Lab Results  Component Value Date   CREATININE 0.93 06/02/2022    No results found for: "PSA"  No results found for: "TESTOSTERONE"  No results found for: "HGBA1C"  Urinalysis    Component Value Date/Time   COLORURINE YELLOW (A) 06/02/2022 1921   APPEARANCEUR CLEAR (A) 06/02/2022 1921   APPEARANCEUR Clear 05/10/2022 0904   LABSPEC 1.013 06/02/2022 1921   PHURINE 6.0 06/02/2022 1921   GLUCOSEU NEGATIVE 06/02/2022 1921   HGBUR NEGATIVE 06/02/2022 1921   BILIRUBINUR NEGATIVE 06/02/2022 1921   BILIRUBINUR Negative 05/10/2022 0904   KETONESUR NEGATIVE 06/02/2022 1921   PROTEINUR NEGATIVE 06/02/2022  1921   NITRITE NEGATIVE 06/02/2022 1921   LEUKOCYTESUR NEGATIVE 06/02/2022 1921    Pertinent Imaging:   Assessment & Plan: 30x11 Macrodantin sent to pharmacy and I will see in 1 year  1. Mixed incontinence    No follow-ups on file.  Reece Packer, MD  [facility] [address], [address]  [place], [place] [address] [phone]

## 2022-08-19 ENCOUNTER — Telehealth: Payer: Self-pay | Admitting: Family Medicine

## 2022-08-19 NOTE — Progress Notes (Signed)
Cardiology Office Note:    Date:  08/20/2022   ID:  Regina Hernandez, DOB 1934-10-13, MRN [id number]  PCP:  Lesleigh Noe, MD  [facility] Cardiologist:  Kate Sable, MD  [facility] Electrophysiologist:  None   Referring MD: Lesleigh Noe, MD   Chief Complaint: 1 month follow-up  History of Present Illness:    Regina Hernandez is a 88 y.o. female with a hx of anxiety, bradycardia, orthostatic hypotension, SSS s/p PPM medtronic device who presents for 1 month follow-up.  LHC in 2012 at [place] showed minimal irregularities in the LAD and RCA.    She was previously seen for orthostatic hypotension and was started on midodrine '5mg'$  TID. Symptoms had resolved with midodrine.    Echo 03/2022 showed LVEF 60-65%, mild aortic sclerosis with no stenosis.    Seen in the ER 6/9 23 for complaints of suicidal. Plan was to transfer to [place]. Midodrine was stopped.   Last seen 07/19/22 and reported she was in a psych hospital for 2 weeks for attempted suicide. She was on amlodipine. Orthostatics were negative and BP was high, so Amlodipine was refilled.   Today, the patient is overall doing ell. BP is a little high, but she just walked in. She denies orthostatic symptoms. She is on the lowest dose of amlodipine. No chest pain or SOB. NO LLE. She is short of breath with very long distances. But in general, breathing is normal. No orthopnea or pnd.     Past Medical History:  Diagnosis Date   Anxiety    Cervical dystonia    Essential tremor    GI bleed    Memory loss    Migraine    Occipital neuralgia     Past Surgical History:  Procedure Laterality Date   BOWEL RESECTION     CARDIAC CATHETERIZATION  2016   no stents/[facility]   ESOPHAGOGASTRODUODENOSCOPY N/A 06/18/2020   Procedure: ESOPHAGOGASTRODUODENOSCOPY (EGD);  Surgeon: Toledo, Benay Pike, MD;  Location: [facility] ENDOSCOPY;  Service: Gastroenterology;  Laterality: N/A;   PACEMAKER IMPLANT     REPLACEMENT  TOTAL KNEE Left     Current Medications: Current Meds  Medication Sig   acetaminophen (TYLENOL) 500 MG tablet Take 2 tablets (1,000 mg total) by mouth every 8 (eight) hours as needed for mild pain or moderate pain.   amLODipine (NORVASC) 2.5 MG tablet Take 1 tablet (2.5 mg total) by mouth daily.   ARIPiprazole (ABILIFY) 2 MG tablet Take 2 mg by mouth every morning.   aspirin EC 81 MG tablet Take 81 mg by mouth daily. Swallow whole.   atorvastatin (LIPITOR) 10 MG tablet Take 1 tablet (10 mg total) by mouth daily.   Cholecalciferol (VITAMIN D3 PO) Take 25 mcg by mouth daily.   docusate sodium (COLACE) 100 MG capsule Take 100 mg by mouth daily.   estradiol (ESTRACE) 0.1 MG/GM vaginal cream Place 1 Applicatorful vaginally as needed.   fluvoxaMINE (LUVOX) 50 MG tablet Take 50 mg by mouth at bedtime.   gabapentin (NEURONTIN) 400 MG capsule Take 1 capsule (400 mg total) by mouth 2 (two) times daily.   melatonin 5 MG TABS Take 5 mg by mouth.   memantine (NAMENDA) 10 MG tablet Take 1 tablet (10 mg total) by mouth 2 (two) times daily.   nitrofurantoin (MACRODANTIN) 100 MG capsule Take 1 capsule (100 mg total) by mouth daily.   primidone (MYSOLINE) 50 MG tablet Take 100 mg by mouth at bedtime.   venlafaxine XR (EFFEXOR-XR) 75  MG 24 hr capsule Take 75 mg by mouth 3 (three) times daily.   vitamin B-12 (CYANOCOBALAMIN) 1000 MCG tablet Take 1,000 mcg by mouth daily.     Allergies:   Adhesive [tape], Baclofen, Lyrica [pregabalin], and Sulfa antibiotics   Social History   Socioeconomic History   Marital status: Widowed    Spouse name: Not on file   Number of children: 5   Years of education: 12th   Highest education level: High school graduate  Occupational History   Occupation: Retired  Tobacco Use   Smoking status: Never    Passive exposure: Never   Smokeless tobacco: Never  Vaping Use   Vaping Use: Never used  Substance and Sexual Activity   Alcohol use: Not Currently   Drug use: Never    Sexual activity: Not Currently  Other Topics Concern   Not on file  Social History Narrative   Lives at home with her daughters.   Right-handed.   Two cups caffeine per day.      02/10/21   From: [place] - moved to [facility] 2004   Living: with daughter [name] and Ronny Bacon granddaughter and [name] (daughter)   Work: retired - daycare       Family: Lives with daughters - [name] and [name], 3 other children across the country - too many to count ~15, a few great-grandchildren      Enjoys: play cards, word search books, watch TV, relaxing outside      Exercise: not currently   Diet: good appetite, cauliflower, chicken, likes most things      Safety   Seat belts: Yes    Guns: No   Safe in relationships: Yes    Social Determinants of Radio broadcast assistant Strain: Not on file  Food Insecurity: Not on file  Transportation Needs: Not on file  Physical Activity: Not on file  Stress: Not on file  Social Connections: Not on file     Family History: The patient's family history includes Other in her mother; Stomach cancer in her brother; Ulcers in her father.  ROS:   Please see the history of present illness.     All other systems reviewed and are negative.  EKGs/Labs/Other Studies Reviewed:    The following studies were reviewed today:  Echo 03/2022  1. Left ventricular ejection fraction, by estimation, is 60 to 65%. The  left ventricle has normal function. The left ventricle has no regional  wall motion abnormalities. Left ventricular diastolic parameters are  consistent with Grade I diastolic  dysfunction (impaired relaxation).   2. Right ventricular systolic function is normal. The right ventricular  size is normal.   3. The mitral valve is normal in structure. Trivial mitral valve  regurgitation.   4. The aortic valve was not well visualized. Aortic valve regurgitation  is not visualized. Aortic valve sclerosis/calcification is present,  without any evidence of aortic  stenosis.   5. The inferior vena cava is normal in size with greater than 50%  respiratory variability, suggesting right atrial pressure of 3 mmHg.   Comparison(s): LVEF 60-65%.   EKG:  EKG is ordered today.  The ekg ordered today demonstrates AV paced rhythm, LAD, nonspecific T wave changes  Recent Labs: 06/02/2022: ALT 13; BUN 26; Creatinine, Ser 0.93; Hemoglobin 12.9; Hemoglobin 12.9; Hemoglobin 13.0; Platelets 160; Platelets 188; Platelets 174; Potassium 4.4; Sodium 135  Recent Lipid Panel    Component Value Date/Time   CHOL 203 (H) 08/11/2022 1216   TRIG 87.0  08/11/2022 1216   HDL 89.30 08/11/2022 1216   CHOLHDL 2 08/11/2022 1216   VLDL 17.4 08/11/2022 1216   LDLCALC 96 08/11/2022 1216     Physical Exam:    VS:  BP (!) 138/90 (BP Location: Left Arm, Patient Position: Sitting, Cuff Size: Normal)   Pulse 75   Ht '5\' 2"'$  (1.575 m)   Wt 155 lb 6.4 oz (70.5 kg)   SpO2 97%   BMI 28.42 kg/m     Wt Readings from Last 3 Encounters:  08/20/22 155 lb 6.4 oz (70.5 kg)  08/16/22 155 lb (70.3 kg)  08/11/22 155 lb 6 oz (70.5 kg)     GEN:  Well nourished, well developed in no acute distress HEENT: Normal NECK: No JVD; No carotid bruits LYMPHATICS: No lymphadenopathy CARDIAC: RRR, no murmurs, rubs, gallops RESPIRATORY:  Clear to auscultation without rales, wheezing or rhonchi  ABDOMEN: Soft, non-tender, non-distended MUSCULOSKELETAL:  No edema; No deformity  SKIN: Warm and dry NEUROLOGIC:  Alert and oriented x 3 PSYCHIATRIC:  Normal affect   ASSESSMENT:    1. Essential hypertension   2. Sinus node dysfunction (HCC)   3. Sick sinus syndrome (HCC)    PLAN:    In order of problems listed above:  HTN H/o orthostatic hypotension Patient is tolerating amlodipine well. She denies orthostatic symptoms. BP a little high, but reasonable. I recommended she check  her BP at home and let us know if it's persistently high. No further changes, continue amlodipine 2.'5mg'$  daily.   H/o  bradycardia SSS s/p PPM Last device check showed a normally functioning device. She is followed by Dr. Caryl Comes.  Disposition: Follow up in 6 month(s) with MD/APP    Signed, Blain Hunsucker Ninfa Meeker, PA-C  08/20/2022 10:09 AM    [facility]

## 2022-08-19 NOTE — Telephone Encounter (Signed)
Copied from [place] [id number] [id number]. Topic: Medicare AWV >> Aug 19, 2022  1:42 PM Jae Dire wrote: Reason for CRM:  Left message for patient to call back and schedule Medicare Annual Wellness Visit (AWV).   Please offer to do virtually or by telephone.   Last AWV:: 08/10/2021  Please schedule at anytime with [facility] Advisor schedule    45 minute appointent  If any questions, please contact me at [phone]

## 2022-08-20 ENCOUNTER — Encounter: Payer: Self-pay | Admitting: Medical

## 2022-08-20 ENCOUNTER — Ambulatory Visit (INDEPENDENT_AMBULATORY_CARE_PROVIDER_SITE_OTHER): Payer: Medicare Other | Admitting: Medical

## 2022-08-20 VITALS — BP 138/90 | HR 75 | Ht 62.0 in | Wt 155.4 lb

## 2022-08-20 DIAGNOSIS — I10 Essential (primary) hypertension: Secondary | ICD-10-CM

## 2022-08-20 DIAGNOSIS — I495 Sick sinus syndrome: Secondary | ICD-10-CM

## 2022-08-20 NOTE — Patient Instructions (Signed)
Medication Instructions:  Your physician recommends that you continue on your current medications as directed. Please refer to the Current Medication list given to you today.  *If you need a refill on your cardiac medications before your next appointment, please call your pharmacy*   Lab Work: None ordered  If you have labs (blood work) drawn today and your tests are completely normal, you will receive your results only by: [place] (if you have MyChart) OR A paper copy in the mail If you have any lab test that is abnormal or we need to change your treatment, we will call you to review the results.   Testing/Procedures: None ordered  Follow-Up: At [facility], you and your health needs are our priority.  As part of our continuing mission to provide you with exceptional heart care, we have created designated Provider Care Teams.  These Care Teams include your primary Cardiologist (physician) and Advanced Practice Providers (APPs -  Physician Assistants and Nurse Practitioners) who all work together to provide you with the care you need, when you need it.  We recommend signing up for the patient portal called "MyChart".  Sign up information is provided on this After Visit Summary.  MyChart is used to connect with patients for Virtual Visits (Telemedicine).  Patients are able to view lab/test results, encounter notes, upcoming appointments, etc.  Non-urgent messages can be sent to your provider as well.   To learn more about what you can do with MyChart, go to [email or web address].    Your next appointment:   6 month(s)  The format for your next appointment:   In Person  Provider:   You may see Dr. Garen Lah or one of the following Advanced Practice Providers on your designated Care Team:   Murray Hodgkins, NP Christell Faith, PA-C Cadence Kathlen Mody, PA-C{  Important Information About Sugar

## 2022-08-27 ENCOUNTER — Inpatient Hospital Stay: Payer: Medicare Other

## 2022-08-27 ENCOUNTER — Inpatient Hospital Stay: Payer: Medicare Other | Admitting: Anesthesiology

## 2022-08-27 ENCOUNTER — Inpatient Hospital Stay
Admission: EM | Admit: 2022-08-27 | Discharge: 2022-08-28 | DRG: 378 | Disposition: A | Discharge status: Other Acute Inpatient Hospital | Payer: Medicare Other | Attending: Family Medicine | Admitting: Family Medicine

## 2022-08-27 ENCOUNTER — Encounter
Admission: EM | Disposition: A | Discharge status: Other Acute Inpatient Hospital | Payer: Self-pay | Source: Home / Self Care | Attending: Internal Medicine

## 2022-08-27 ENCOUNTER — Other Ambulatory Visit: Payer: Self-pay

## 2022-08-27 ENCOUNTER — Encounter: Payer: Self-pay | Admitting: Emergency Medicine

## 2022-08-27 DIAGNOSIS — Z96652 Presence of left artificial knee joint: Secondary | ICD-10-CM | POA: Diagnosis present

## 2022-08-27 DIAGNOSIS — I959 Hypotension, unspecified: Secondary | ICD-10-CM | POA: Diagnosis present

## 2022-08-27 DIAGNOSIS — Z79899 Other long term (current) drug therapy: Secondary | ICD-10-CM | POA: Diagnosis not present

## 2022-08-27 DIAGNOSIS — K922 Gastrointestinal hemorrhage, unspecified: Secondary | ICD-10-CM | POA: Diagnosis present

## 2022-08-27 DIAGNOSIS — I472 Ventricular tachycardia, unspecified: Secondary | ICD-10-CM | POA: Diagnosis present

## 2022-08-27 DIAGNOSIS — K260 Acute duodenal ulcer with hemorrhage: Principal | ICD-10-CM | POA: Diagnosis present

## 2022-08-27 DIAGNOSIS — Y829 Unspecified medical devices associated with adverse incidents: Secondary | ICD-10-CM | POA: Diagnosis present

## 2022-08-27 DIAGNOSIS — Z950 Presence of cardiac pacemaker: Secondary | ICD-10-CM

## 2022-08-27 DIAGNOSIS — Z7982 Long term (current) use of aspirin: Secondary | ICD-10-CM

## 2022-08-27 DIAGNOSIS — G3184 Mild cognitive impairment, so stated: Secondary | ICD-10-CM | POA: Diagnosis present

## 2022-08-27 DIAGNOSIS — I10 Essential (primary) hypertension: Secondary | ICD-10-CM | POA: Diagnosis present

## 2022-08-27 DIAGNOSIS — Z888 Allergy status to other drugs, medicaments and biological substances status: Secondary | ICD-10-CM

## 2022-08-27 DIAGNOSIS — I495 Sick sinus syndrome: Secondary | ICD-10-CM | POA: Diagnosis present

## 2022-08-27 DIAGNOSIS — F0393 Unspecified dementia, unspecified severity, with mood disturbance: Secondary | ICD-10-CM | POA: Diagnosis present

## 2022-08-27 DIAGNOSIS — Z8616 Personal history of COVID-19: Secondary | ICD-10-CM

## 2022-08-27 DIAGNOSIS — N1831 Chronic kidney disease, stage 3a: Secondary | ICD-10-CM | POA: Diagnosis present

## 2022-08-27 DIAGNOSIS — Z91048 Other nonmedicinal substance allergy status: Secondary | ICD-10-CM | POA: Diagnosis not present

## 2022-08-27 DIAGNOSIS — F03A4 Unspecified dementia, mild, with anxiety: Secondary | ICD-10-CM | POA: Diagnosis present

## 2022-08-27 DIAGNOSIS — I129 Hypertensive chronic kidney disease with stage 1 through stage 4 chronic kidney disease, or unspecified chronic kidney disease: Secondary | ICD-10-CM | POA: Diagnosis present

## 2022-08-27 DIAGNOSIS — T80818A Extravasation of other vesicant agent, initial encounter: Secondary | ICD-10-CM | POA: Diagnosis present

## 2022-08-27 DIAGNOSIS — Z66 Do not resuscitate: Secondary | ICD-10-CM | POA: Diagnosis present

## 2022-08-27 DIAGNOSIS — Z882 Allergy status to sulfonamides status: Secondary | ICD-10-CM

## 2022-08-27 DIAGNOSIS — F323 Major depressive disorder, single episode, severe with psychotic features: Secondary | ICD-10-CM | POA: Diagnosis present

## 2022-08-27 DIAGNOSIS — D62 Acute posthemorrhagic anemia: Secondary | ICD-10-CM | POA: Diagnosis present

## 2022-08-27 DIAGNOSIS — I2510 Atherosclerotic heart disease of native coronary artery without angina pectoris: Secondary | ICD-10-CM | POA: Diagnosis present

## 2022-08-27 HISTORY — PX: ESOPHAGOGASTRODUODENOSCOPY: SHX5428

## 2022-08-27 LAB — COMPREHENSIVE METABOLIC PANEL
ALT: 13 U/L (ref 0–44)
AST: 23 U/L (ref 15–41)
Albumin: 3.8 g/dL (ref 3.5–5.0)
Alkaline Phosphatase: 53 U/L (ref 38–126)
Anion gap: 5 (ref 5–15)
BUN: 41 mg/dL — ABNORMAL HIGH (ref 8–23)
CO2: 26 mmol/L (ref 22–32)
Calcium: 9.8 mg/dL (ref 8.9–10.3)
Chloride: 108 mmol/L (ref 98–111)
Creatinine, Ser: 1.01 mg/dL — ABNORMAL HIGH (ref 0.44–1.00)
GFR, Estimated: 54 mL/min — ABNORMAL LOW (ref >60)
Glucose, Bld: 122 mg/dL — ABNORMAL HIGH (ref 70–99)
Potassium: 5.1 mmol/L (ref 3.5–5.1)
Sodium: 139 mmol/L (ref 135–145)
Total Bilirubin: 1 mg/dL (ref 0.3–1.2)
Total Protein: 6.9 g/dL (ref 6.5–8.1)

## 2022-08-27 LAB — PREPARE RBC (CROSSMATCH)

## 2022-08-27 LAB — CBC WITH DIFFERENTIAL/PLATELET
Abs Immature Granulocytes: 0.07 10*3/uL (ref 0.00–0.07)
Basophils Absolute: 0.1 10*3/uL (ref 0.0–0.1)
Basophils Relative: 1 %
Eosinophils Absolute: 0.1 10*3/uL (ref 0.0–0.5)
Eosinophils Relative: 1 %
HCT: 36.6 % (ref 36.0–46.0)
Hemoglobin: 11.4 g/dL — ABNORMAL LOW (ref 12.0–15.0)
Immature Granulocytes: 1 %
Lymphocytes Relative: 9 %
Lymphs Abs: 1.1 10*3/uL (ref 0.7–4.0)
MCH: 29.3 pg (ref 26.0–34.0)
MCHC: 31.1 g/dL (ref 30.0–36.0)
MCV: 94.1 fL (ref 80.0–100.0)
Monocytes Absolute: 0.4 10*3/uL (ref 0.1–1.0)
Monocytes Relative: 4 %
Neutro Abs: 10.2 10*3/uL — ABNORMAL HIGH (ref 1.7–7.7)
Neutrophils Relative %: 84 %
Platelets: 194 10*3/uL (ref 150–400)
RBC: 3.89 MIL/uL (ref 3.87–5.11)
RDW: 14.6 % (ref 11.5–15.5)
WBC: 11.9 10*3/uL — ABNORMAL HIGH (ref 4.0–10.5)
nRBC: 0 % (ref 0.0–0.2)

## 2022-08-27 LAB — HEMOGLOBIN AND HEMATOCRIT, BLOOD
HCT: 28.1 % — ABNORMAL LOW (ref 36.0–46.0)
HCT: 29.5 % — ABNORMAL LOW (ref 36.0–46.0)
Hemoglobin: 8.6 g/dL — ABNORMAL LOW (ref 12.0–15.0)
Hemoglobin: 9.7 g/dL — ABNORMAL LOW (ref 12.0–15.0)

## 2022-08-27 LAB — APTT: aPTT: 30 seconds (ref 24–36)

## 2022-08-27 LAB — TYPE AND SCREEN

## 2022-08-27 LAB — PROTIME-INR
INR: 1.1 (ref 0.8–1.2)
Prothrombin Time: 13.8 seconds (ref 11.4–15.2)

## 2022-08-27 LAB — LIPASE, BLOOD: Lipase: 31 U/L (ref 11–51)

## 2022-08-27 LAB — MRSA NEXT GEN BY PCR, NASAL: MRSA by PCR Next Gen: NOT DETECTED

## 2022-08-27 SURGERY — EGD (ESOPHAGOGASTRODUODENOSCOPY)
Anesthesia: General

## 2022-08-27 MED ORDER — PROPOFOL 10 MG/ML IV BOLUS
INTRAVENOUS | Status: DC | PRN
  Administered 2022-08-27: 40 mg via INTRAVENOUS

## 2022-08-27 MED ORDER — PANTOPRAZOLE 80MG IVPB - SIMPLE MED
80.0000 mg | Freq: Once | INTRAVENOUS | Status: AC
  Administered 2022-08-27: 80 mg via INTRAVENOUS
  Filled 2022-08-27: qty 100

## 2022-08-27 MED ORDER — FENTANYL CITRATE PF 50 MCG/ML IJ SOSY
PREFILLED_SYRINGE | INTRAMUSCULAR | Status: AC
  Administered 2022-08-27: 25 ug
  Filled 2022-08-27: qty 1

## 2022-08-27 MED ORDER — SODIUM CHLORIDE 0.9 % IV SOLN
INTRAVENOUS | Status: DC
Start: 2022-08-27 — End: 2022-08-28

## 2022-08-27 MED ORDER — FENTANYL CITRATE (PF) 50 MCG/ML IJ SOLN: 25.0000 ug | Freq: Once | INTRAMUSCULAR | Status: DC

## 2022-08-27 MED ORDER — SODIUM CHLORIDE 0.9 % IV BOLUS
1000.0000 mL | Freq: Once | INTRAVENOUS | Status: AC
  Administered 2022-08-27: 1000 mL via INTRAVENOUS

## 2022-08-27 MED ORDER — METOCLOPRAMIDE HCL 5 MG/ML IJ SOLN
10.0000 mg | Freq: Once | INTRAMUSCULAR | Status: AC
  Administered 2022-08-27: 10 mg via INTRAVENOUS
  Filled 2022-08-27: qty 2

## 2022-08-27 MED ORDER — PANTOPRAZOLE INFUSION (NEW) - SIMPLE MED
8.0000 mg/h | INTRAVENOUS | Status: DC
  Administered 2022-08-27 – 2022-08-28 (×4): 8 mg/h via INTRAVENOUS
  Filled 2022-08-27 (×4): qty 100

## 2022-08-27 MED ORDER — SODIUM CHLORIDE 0.9 % IV SOLN
250.0000 mg | INTRAVENOUS | Status: AC
  Filled 2022-08-27: qty 5

## 2022-08-27 MED ORDER — SODIUM CHLORIDE (PF) 0.9 % IJ SOLN
PREFILLED_SYRINGE | INTRAMUSCULAR | Status: DC | PRN
  Administered 2022-08-27: 1 mL
  Administered 2022-08-27: 2 mL

## 2022-08-27 MED ORDER — ONDANSETRON HCL 4 MG PO TABS: 4.0000 mg | ORAL_TABLET | Freq: Four times a day (QID) | ORAL | Status: DC | PRN

## 2022-08-27 MED ORDER — ESMOLOL HCL 100 MG/10ML IV SOLN
INTRAVENOUS | Status: DC | PRN
  Administered 2022-08-27: 10 mg via INTRAVENOUS

## 2022-08-27 MED ORDER — METOCLOPRAMIDE HCL 5 MG/ML IJ SOLN
10.0000 mg | Freq: Three times a day (TID) | INTRAMUSCULAR | Status: DC
  Administered 2022-08-27 – 2022-08-28 (×4): 10 mg via INTRAVENOUS
  Filled 2022-08-27 (×4): qty 2

## 2022-08-27 MED ORDER — SODIUM CHLORIDE 0.9 % IV SOLN
INTRAVENOUS | Status: DC
Start: 2022-08-27 — End: 2022-08-27

## 2022-08-27 MED ORDER — CHLORHEXIDINE GLUCONATE CLOTH 2 % EX PADS
6.0000 | MEDICATED_PAD | Freq: Every day | CUTANEOUS | Status: DC
  Administered 2022-08-27 – 2022-08-28 (×2): 6 via TOPICAL

## 2022-08-27 MED ORDER — SODIUM CHLORIDE 0.9% IV SOLUTION
Freq: Once | INTRAVENOUS | Status: AC
  Filled 2022-08-27: qty 250

## 2022-08-27 MED ORDER — ONDANSETRON HCL 4 MG/2ML IJ SOLN
INTRAMUSCULAR | Status: AC
  Administered 2022-08-27: 4 mg
  Filled 2022-08-27: qty 2

## 2022-08-27 MED ORDER — EPINEPHRINE 1 MG/10ML IJ SOSY
PREFILLED_SYRINGE | INTRAMUSCULAR | Status: AC
  Filled 2022-08-27: qty 10

## 2022-08-27 MED ORDER — PROPOFOL 500 MG/50ML IV EMUL
INTRAVENOUS | Status: DC | PRN
  Administered 2022-08-27: 50 ug/kg/min via INTRAVENOUS

## 2022-08-27 MED ORDER — PANTOPRAZOLE SODIUM 40 MG IV SOLR: 40.0000 mg | Freq: Two times a day (BID) | INTRAVENOUS | Status: DC

## 2022-08-27 MED ORDER — ONDANSETRON HCL 4 MG/2ML IJ SOLN: 4.0000 mg | Freq: Four times a day (QID) | INTRAMUSCULAR | Status: DC | PRN

## 2022-08-27 NOTE — Assessment & Plan Note (Addendum)
Secondary to GI bleed Patient presents for evaluation of several episodes of hematemesis and passage of melena stools and noted to have a drop in her H&H from 11.4 >> 8.6 Patient became hypotensive with systolic blood pressure in the 80s We will transfuse 1 unit of packed RBC Continue aggressive IV fluid resuscitation Check serial H&H and transfuse as needed

## 2022-08-27 NOTE — ED Notes (Signed)
Pt had another dark BM, pt was incontinent at this time.

## 2022-08-27 NOTE — OR Nursing (Signed)
TRANSFERRED TO ICU 8 ON MONITOR. REPORTS NO PAIN. RESTING QUIETLY. NO FURTHER BMS

## 2022-08-27 NOTE — Anesthesia Procedure Notes (Signed)
Date/Time: 08/27/2022 12:31 PM  Performed by: Nelda Marseille, CRNAPre-anesthesia Checklist: Patient identified, Emergency Drugs available, Suction available, Patient being monitored and Timeout performed Oxygen Delivery Method: Nasal cannula

## 2022-08-27 NOTE — Assessment & Plan Note (Signed)
Resume Namenda when appropriate

## 2022-08-27 NOTE — Consult Note (Signed)
[facility] GI Inpatient Consult Note   Kathline Magic, M.D.  Reason for Consult: Upper GI bleeding, GI blood loss anemia   Attending Requesting Consult: Jimmy Footman, M.D.   History of Present Illness: Regina Hernandez is a 88 y.o. female with a history of early dementia, accompanied by her daughter Regina Hernandez) for recent onset of coffee-ground emesis early this morning around 4 AM.  Patient has had progressive "burning in the abdomen" since early this morning as well as mild to moderate nausea.  She had several episodes according to the daughter of coffee-ground emesis at home.  She was given Reglan in the emergency room and then had a large bowel movement B" with several clots in it".  Patient has mild to moderate nagging epigastric discomfort but otherwise denies severe pain. Patient's history is significant for history of "gastric ulcers" which were diagnosed either at [facility] or [facility] around 2 years ago.  Patient takes a baby aspirin daily without any mucosal protective agent.  Patient takes no prescription anticoagulants. Patient appears to be alert and oriented at present and gives at adequate recent and remote medical history. Patient currently has a pacemaker implanted but denies any personal history of myocardial infarction, congestive heart failure, seizure or stroke.  Patient denies any personal history of chronic lung disease and has no history of tobacco use.  Past Medical History:  Past Medical History:  Diagnosis Date   Anxiety    Cervical dystonia    Essential tremor    GI bleed    Memory loss    Migraine    Occipital neuralgia     Problem List: Patient Active Problem List   Diagnosis Date Noted   GI bleed 08/27/2022   Acute blood loss anemia (ABLA) 08/27/2022   ABLA (acute blood loss anemia) 08/27/2022   COVID-19 12/25/2021   Mild cognitive impairment 10/27/2021   Nonintractable headache 10/27/2021   History of stomach ulcers 08/01/2020    Diarrhea 07/21/2020   Ventricular tachycardia ([place]) 06/22/2020   Syncopal episodes 06/21/2020   Orthostatic hypotension 06/21/2020   Melena 06/17/2020   Essential tremor    Arachnoid cyst 02/11/2020   Chronic headaches 02/11/2020   Coccydynia 02/11/2020   H/O staphylococcal infection 02/11/2020   Migraine 02/11/2020   Vertigo 02/11/2020   Mild cognitive impairment with memory loss 02/04/2020   Gait abnormality 02/04/2020   Depression 02/04/2020   Major depression with psychotic features ([place]) 10/31/2019   History of suicide attempt 10/30/2019   Iron deficiency anemia 05/22/2019   Status post left knee replacement 11/20/2018   Pain of cervical facet joint 07/13/2018   Arthritis 06/01/2018   Tremor 06/01/2018   Spondylosis without myelopathy or radiculopathy, cervical region 01/19/2017   Diverticulosis of large intestine without hemorrhage 11/30/2016   Stage 3 chronic kidney disease ([place]) 11/30/2016   Plantar fasciitis of left foot 09/29/2016   Left knee pain 11/26/2014   Polyp of colon 11/01/2013   Essential hypertension 03/01/2012   Pacemaker 01/26/2012   History of endocarditis 09/15/2011   Coronary atherosclerosis 08/17/2011   Sick sinus syndrome ([place]) 08/17/2011   Barrett's esophagus without dysplasia 07/20/2010   Microscopic hematuria 07/20/2010   Recurrent UTI 07/20/2010   Vestibulitis, vulvar 07/20/2010    Past Surgical History: Past Surgical History:  Procedure Laterality Date   BOWEL RESECTION     CARDIAC CATHETERIZATION  2016   no stents/[facility]   ESOPHAGOGASTRODUODENOSCOPY N/A 06/18/2020   Procedure: ESOPHAGOGASTRODUODENOSCOPY (EGD);  Surgeon: [place], [place],  MD;  Location: [facility] ENDOSCOPY;  Service: Gastroenterology;  Laterality: N/A;   PACEMAKER IMPLANT     REPLACEMENT TOTAL KNEE Left     Allergies: Allergies  Allergen Reactions   Adhesive [Tape] Other (See Comments)    Contact dermatitis   Baclofen Rash   Lyrica [Pregabalin]  Other (See Comments)    Unable to remember reaction.   Sulfa Antibiotics Rash    Home Medications: (Not in a hospital admission)  Home medication reconciliation was completed with the patient.   Scheduled Inpatient Medications:    sodium chloride   Intravenous Once   sodium chloride   Intravenous Once   sodium chloride   Intravenous Once   metoCLOPramide (REGLAN) injection  10 mg Intravenous Q8H   [START ON 08/30/2022] pantoprazole  40 mg Intravenous Q12H    Continuous Inpatient Infusions:    sodium chloride     erythromycin     pantoprazole 8 mg/hr (08/27/22 0734)    PRN Inpatient Medications:  ondansetron **OR** ondansetron (ZOFRAN) IV  Family History: family history includes Other in her mother; Stomach cancer in her brother; Ulcers in her father.   GI Family History: Patient's father had "ulcers".  Social History:   reports that she has never smoked. She has never been exposed to tobacco smoke. She has never used smokeless tobacco. She reports that she does not currently use alcohol. She reports that she does not use drugs. The patient denies ETOH, tobacco, or drug use.    Review of Systems: Review of Systems - Negative except that in HPI  Physical Examination: BP (!) 131/92   Pulse (!) 110   Temp 98 F (36.7 C) (Oral)   Resp 15   Ht '5\' 2"'$  (1.575 m)   Wt 70.3 kg   SpO2 99%   BMI 28.35 kg/m  Physical Exam Constitutional:      General: She is not in acute distress.    Appearance: She is normal weight. She is ill-appearing and diaphoretic. She is not toxic-appearing.  HENT:     Head: Normocephalic.     Nose: Nose normal.  Eyes:     General: No scleral icterus.       Right eye: No discharge.        Left eye: No discharge.     Extraocular Movements: Extraocular movements intact.     Pupils: Pupils are equal, round, and reactive to light.  Cardiovascular:     Rate and Rhythm: Normal rate.     Pulses: Normal pulses.     Heart sounds:     No gallop.   Pulmonary:     Effort: Pulmonary effort is normal. No respiratory distress.     Breath sounds: Normal breath sounds.  Abdominal:     General: Bowel sounds are normal. There is no distension.     Palpations: Abdomen is soft. There is no mass.     Tenderness: There is abdominal tenderness. There is no right CVA tenderness, left CVA tenderness, guarding or rebound.     Hernia: No hernia is present.  Musculoskeletal:     Cervical back: Normal range of motion.  Skin:    Coloration: Skin is pale.  Neurological:     General: No focal deficit present.     Mental Status: She is alert.  Psychiatric:        Mood and Affect: Mood normal.        Thought Content: Thought content normal.        Judgment: Judgment  normal.     Data: Lab Results  Component Value Date   WBC 11.9 (H) 08/27/2022   HGB 8.6 (L) 08/27/2022   HCT 28.1 (L) 08/27/2022   MCV 94.1 08/27/2022   PLT 194 08/27/2022   Recent Labs  Lab 08/27/22 0606 08/27/22 0932  HGB 11.4* 8.6*   Lab Results  Component Value Date   NA 139 08/27/2022   K 5.1 08/27/2022   CL 108 08/27/2022   CO2 26 08/27/2022   BUN 41 (H) 08/27/2022   CREATININE 1.01 (H) 08/27/2022   GLU 94 07/23/2021   Lab Results  Component Value Date   ALT 13 08/27/2022   AST 23 08/27/2022   ALKPHOS 53 08/27/2022   BILITOT 1.0 08/27/2022   Recent Labs  Lab 08/27/22 0606  APTT 30  INR 1.1      Latest Ref Rng & Units 08/27/2022    9:32 AM 08/27/2022    6:06 AM 06/02/2022    7:21 PM  CBC  WBC 4.0 - 10.5 K/uL  11.9  7.9    7.8    7.8   Hemoglobin 12.0 - 15.0 g/dL 8.6  11.4  12.9    12.9    13.0   Hematocrit 36.0 - 46.0 % 28.1  36.6  40.5    40.8    40.6   Platelets 150 - 400 K/uL  194  160    188    174     STUDIES: No results found. '@IMAGES'$ @  Assessment::  Acute upper Gastrointestinal bleeding - Ddx includes PUD, Mallory weiss tear,GI malignancy (less likely), severe erosive esophagitis. Acute post-hemorragic anemia. Mild  dementia. History of arrhythmia s/p pacemaker.     Recommendations:  2 IV's, IV protonix gtt. Serial CBC. IV erythromycin ordered to help empty blood from stomach. Urgent EGD. The patient and her daughter, Regina Hernandez, understand the nature of the planned procedure, indications, risks, alternatives and potential complications including but not limited to bleeding, infection, perforation, damage to internal organs and possible oversedation/side effects from anesthesia. The patient agrees and gives consent to proceed.  Please refer to procedure notes for findings, recommendations and patient disposition/instructions.  Thank you for the consult. Please call with questions or concerns.  Olean Ree, "Lanny Hurst MD [facility] [place], [place] [address] [phone]  08/27/2022 11:30 AM

## 2022-08-27 NOTE — Assessment & Plan Note (Signed)
Most likely upper GI bleed from PUD Patient presents to the ER for evaluation of coffee-ground emesis and melena stools Has a history of PUD Continue Protonix drip initiated in the ER Check serial H&H and transfuse as needed GI consult

## 2022-08-27 NOTE — ED Notes (Signed)
Pt vomited bright red blood [place], MD made aware and asked to come to bedside. Emergent blood ordered.

## 2022-08-27 NOTE — ED Triage Notes (Signed)
Pt to triage via w/c with no distress noted; reports vomiting coffee ground emesis tonight; hx of same with GI bleed and blood transfusions; pt denies any pain

## 2022-08-27 NOTE — Progress Notes (Signed)
Dr. Alice Reichert aware of pain and patient just had large black/red stool incontinent.

## 2022-08-27 NOTE — Progress Notes (Signed)
Patient continued to have active bleeding in the ER with multiple episodes of hematemesis (bright red blood) as well as melena stools.  She was transfused 2 units of packed RBC and was taken emergently to the Endo suite for further evaluation

## 2022-08-27 NOTE — ED Notes (Signed)
Pt placed on bedpan and pt had a medium sized black tarry stool BM with obvious gross blood as well, MD aware.

## 2022-08-27 NOTE — H&P (View-Only) (Signed)
[facility] GI Inpatient Consult Note   Kathline Magic, M.D.  Reason for Consult: Upper GI bleeding, GI blood loss anemia   Attending Requesting Consult: Jimmy Footman, M.D.   History of Present Illness: Regina Hernandez is a 88 y.o. female with a history of early dementia, accompanied by her daughter [name]) for recent onset of coffee-ground emesis early this morning around 4 AM.  Patient has had progressive "burning in the abdomen" since early this morning as well as mild to moderate nausea.  She had several episodes according to the daughter of coffee-ground emesis at home.  She was given Reglan in the emergency room and then had a large bowel movement B" with several clots in it".  Patient has mild to moderate nagging epigastric discomfort but otherwise denies severe pain. Patient's history is significant for history of "gastric ulcers" which were diagnosed either at [facility] or [facility] around 2 years ago.  Patient takes a baby aspirin daily without any mucosal protective agent.  Patient takes no prescription anticoagulants. Patient appears to be alert and oriented at present and gives at adequate recent and remote medical history. Patient currently has a pacemaker implanted but denies any personal history of myocardial infarction, congestive heart failure, seizure or stroke.  Patient denies any personal history of chronic lung disease and has no history of tobacco use.  Past Medical History:  Past Medical History:  Diagnosis Date   Anxiety    Cervical dystonia    Essential tremor    GI bleed    Memory loss    Migraine    Occipital neuralgia     Problem List: Patient Active Problem List   Diagnosis Date Noted   GI bleed 08/27/2022   Acute blood loss anemia (ABLA) 08/27/2022   ABLA (acute blood loss anemia) 08/27/2022   COVID-19 12/25/2021   Mild cognitive impairment 10/27/2021   Nonintractable headache 10/27/2021   History of stomach ulcers 08/01/2020    Diarrhea 07/21/2020   Ventricular tachycardia ([place]) 06/22/2020   Syncopal episodes 06/21/2020   Orthostatic hypotension 06/21/2020   Melena 06/17/2020   Essential tremor    Arachnoid cyst 02/11/2020   Chronic headaches 02/11/2020   Coccydynia 02/11/2020   H/O staphylococcal infection 02/11/2020   Migraine 02/11/2020   Vertigo 02/11/2020   Mild cognitive impairment with memory loss 02/04/2020   Gait abnormality 02/04/2020   Depression 02/04/2020   Major depression with psychotic features ([place]) 10/31/2019   History of suicide attempt 10/30/2019   Iron deficiency anemia 05/22/2019   Status post left knee replacement 11/20/2018   Pain of cervical facet joint 07/13/2018   Arthritis 06/01/2018   Tremor 06/01/2018   Spondylosis without myelopathy or radiculopathy, cervical region 01/19/2017   Diverticulosis of large intestine without hemorrhage 11/30/2016   Stage 3 chronic kidney disease ([place]) 11/30/2016   Plantar fasciitis of left foot 09/29/2016   Left knee pain 11/26/2014   Polyp of colon 11/01/2013   Essential hypertension 03/01/2012   Pacemaker 01/26/2012   History of endocarditis 09/15/2011   Coronary atherosclerosis 08/17/2011   Sick sinus syndrome ([place]) 08/17/2011   Barrett's esophagus without dysplasia 07/20/2010   Microscopic hematuria 07/20/2010   Recurrent UTI 07/20/2010   Vestibulitis, vulvar 07/20/2010    Past Surgical History: Past Surgical History:  Procedure Laterality Date   BOWEL RESECTION     CARDIAC CATHETERIZATION  2016   no stents/[facility]   ESOPHAGOGASTRODUODENOSCOPY N/A 06/18/2020   Procedure: ESOPHAGOGASTRODUODENOSCOPY (EGD);  Surgeon: [place], Miguel Barrera,  MD;  Location: [facility] ENDOSCOPY;  Service: Gastroenterology;  Laterality: N/A;   PACEMAKER IMPLANT     REPLACEMENT TOTAL KNEE Left     Allergies: Allergies  Allergen Reactions   Adhesive [Tape] Other (See Comments)    Contact dermatitis   Baclofen Rash   Lyrica [Pregabalin]  Other (See Comments)    Unable to remember reaction.   Sulfa Antibiotics Rash    Home Medications: (Not in a hospital admission)  Home medication reconciliation was completed with the patient.   Scheduled Inpatient Medications:    sodium chloride   Intravenous Once   sodium chloride   Intravenous Once   sodium chloride   Intravenous Once   metoCLOPramide (REGLAN) injection  10 mg Intravenous Q8H   [START ON 08/30/2022] pantoprazole  40 mg Intravenous Q12H    Continuous Inpatient Infusions:    sodium chloride     erythromycin     pantoprazole 8 mg/hr (08/27/22 0734)    PRN Inpatient Medications:  ondansetron **OR** ondansetron (ZOFRAN) IV  Family History: family history includes Other in her mother; Stomach cancer in her brother; Ulcers in her father.   GI Family History: Patient's father had "ulcers".  Social History:   reports that she has never smoked. She has never been exposed to tobacco smoke. She has never used smokeless tobacco. She reports that she does not currently use alcohol. She reports that she does not use drugs. The patient denies ETOH, tobacco, or drug use.    Review of Systems: Review of Systems - Negative except that in HPI  Physical Examination: BP (!) 131/92   Pulse (!) 110   Temp 98 F (36.7 C) (Oral)   Resp 15   Ht '5\' 2"'$  (1.575 m)   Wt 70.3 kg   SpO2 99%   BMI 28.35 kg/m  Physical Exam Constitutional:      General: She is not in acute distress.    Appearance: She is normal weight. She is ill-appearing and diaphoretic. She is not toxic-appearing.  HENT:     Head: Normocephalic.     Nose: Nose normal.  Eyes:     General: No scleral icterus.       Right eye: No discharge.        Left eye: No discharge.     Extraocular Movements: Extraocular movements intact.     Pupils: Pupils are equal, round, and reactive to light.  Cardiovascular:     Rate and Rhythm: Normal rate.     Pulses: Normal pulses.     Heart sounds:     No gallop.   Pulmonary:     Effort: Pulmonary effort is normal. No respiratory distress.     Breath sounds: Normal breath sounds.  Abdominal:     General: Bowel sounds are normal. There is no distension.     Palpations: Abdomen is soft. There is no mass.     Tenderness: There is abdominal tenderness. There is no right CVA tenderness, left CVA tenderness, guarding or rebound.     Hernia: No hernia is present.  Musculoskeletal:     Cervical back: Normal range of motion.  Skin:    Coloration: Skin is pale.  Neurological:     General: No focal deficit present.     Mental Status: She is alert.  Psychiatric:        Mood and Affect: Mood normal.        Thought Content: Thought content normal.        Judgment: Judgment  normal.     Data: Lab Results  Component Value Date   WBC 11.9 (H) 08/27/2022   HGB 8.6 (L) 08/27/2022   HCT 28.1 (L) 08/27/2022   MCV 94.1 08/27/2022   PLT 194 08/27/2022   Recent Labs  Lab 08/27/22 0606 08/27/22 0932  HGB 11.4* 8.6*   Lab Results  Component Value Date   NA 139 08/27/2022   K 5.1 08/27/2022   CL 108 08/27/2022   CO2 26 08/27/2022   BUN 41 (H) 08/27/2022   CREATININE 1.01 (H) 08/27/2022   GLU 94 07/23/2021   Lab Results  Component Value Date   ALT 13 08/27/2022   AST 23 08/27/2022   ALKPHOS 53 08/27/2022   BILITOT 1.0 08/27/2022   Recent Labs  Lab 08/27/22 0606  APTT 30  INR 1.1      Latest Ref Rng & Units 08/27/2022    9:32 AM 08/27/2022    6:06 AM 06/02/2022    7:21 PM  CBC  WBC 4.0 - 10.5 K/uL  11.9  7.9    7.8    7.8   Hemoglobin 12.0 - 15.0 g/dL 8.6  11.4  12.9    12.9    13.0   Hematocrit 36.0 - 46.0 % 28.1  36.6  40.5    40.8    40.6   Platelets 150 - 400 K/uL  194  160    188    174     STUDIES: No results found. '@IMAGES'$ @  Assessment::  Acute upper Gastrointestinal bleeding - Ddx includes PUD, Mallory weiss tear,GI malignancy (less likely), severe erosive esophagitis. Acute post-hemorragic anemia. Mild  dementia. History of arrhythmia s/p pacemaker.     Recommendations:  2 IV's, IV protonix gtt. Serial CBC. IV erythromycin ordered to help empty blood from stomach. Urgent EGD. The patient and her daughter, [name], understand the nature of the planned procedure, indications, risks, alternatives and potential complications including but not limited to bleeding, infection, perforation, damage to internal organs and possible oversedation/side effects from anesthesia. The patient agrees and gives consent to proceed.  Please refer to procedure notes for findings, recommendations and patient disposition/instructions.  Thank you for the consult. Please call with questions or concerns.  Olean Ree, "Lanny Hurst MD [facility] [place], [place] [address] [phone]  08/27/2022 11:30 AM

## 2022-08-27 NOTE — ED Notes (Signed)
Pt's BP soft at this time, pt maintaining a good MAP at this time. Pt denies dizziness or lightheadedness, MD aware, new orders for 1000 ml bolus.

## 2022-08-27 NOTE — ED Provider Notes (Signed)
[facility] Provider Note    Event Date/Time   First MD Initiated Contact with Patient 08/27/22 [phone]     (approximate)   History   Hematemesis   HPI  Regina Hernandez is a 88 y.o. female who presents to the ED for evaluation of Hematemesis   I reviewed cardiology clinic visit from 8/25.  SSS with [employer] pacer. I review a GI clinic visit from 01/2021.  History of gastric ulcers and C. difficile.  No history of varices on endoscopy from June 2021 Aspirin 81 is only documented thinner.  Patient presents to the ED with her daughter for evaluation multiple episodes of coffee-ground emesis this morning.  Daughter provides majority of history as patient lives with her.  Daughter reports that patient has been fine recently without any recent illnesses or episodes until about 3 AM this morning when she developed nausea and multiple episodes of coffee-ground emesis.  Dizziness without syncope or falls.  No melena, hematochezia   Physical Exam   Triage Vital Signs: ED Triage Vitals  Enc Vitals Group     BP 08/27/22 0515 (!) 106/90     Pulse Rate 08/27/22 0515 (!) 121     Resp 08/27/22 0515 18     Temp --      Temp src --      SpO2 08/27/22 0515 98 %     Weight 08/27/22 0512 155 lb (70.3 kg)     Height 08/27/22 0512 '5\' 2"'$  (1.575 m)     Head Circumference --      Peak Flow --      Pain Score 08/27/22 0512 0     Pain Loc --      Pain Edu? --      Excl. in [place]? --     Most recent vital signs: Vitals:   08/27/22 0515 08/27/22 0600  BP: (!) 106/90 124/86  Pulse: (!) 121 (!) 102  Resp: 18 16  Temp:  98.2 F (36.8 C)  SpO2: 98% 96%    General: Awake, no distress.  CV:  Good peripheral perfusion.  Tachycardic Resp:  Normal effort.  Abd:  No distention.  Epigastric tenderness without peritoneal features MSK:  No deformity noted.  Neuro:  No focal deficits appreciated. Other:     ED Results / Procedures / Treatments   Labs (all labs ordered are  listed, but only abnormal results are displayed) Labs Reviewed  CBC WITH DIFFERENTIAL/PLATELET - Abnormal; Notable for the following components:      Result Value   WBC 11.9 (*)    Hemoglobin 11.4 (*)    Neutro Abs 10.2 (*)    All other components within normal limits  PROTIME-INR  APTT  COMPREHENSIVE METABOLIC PANEL  LIPASE, BLOOD  URINALYSIS, ROUTINE W REFLEX MICROSCOPIC  TYPE AND SCREEN  TYPE AND SCREEN    EKG Sinus tachycardia with a rate of 118 bpm.  Normal axis.  Incomplete left bundle.  No STEMI.  RADIOLOGY   Official radiology report(s): No results found.  PROCEDURES and INTERVENTIONS:  .1-3 Lead EKG Interpretation  Performed by: Vladimir Crofts, MD Authorized by: Vladimir Crofts, MD     Interpretation: abnormal     ECG rate:  111   ECG rate assessment: tachycardic     Rhythm: sinus tachycardia     Ectopy: none     Conduction: normal   .Critical Care  Performed by: Vladimir Crofts, MD Authorized by: Vladimir Crofts, MD   Critical care provider statement:  Critical care time (minutes):  30   Critical care time was exclusive of:  Separately billable procedures and treating other patients   Critical care was necessary to treat or prevent imminent or life-threatening deterioration of the following conditions:  Circulatory failure   Critical care was time spent personally by me on the following activities:  Development of treatment plan with patient or surrogate, discussions with consultants, evaluation of patient's response to treatment, examination of patient, ordering and review of laboratory studies, ordering and review of radiographic studies, ordering and performing treatments and interventions, pulse oximetry, re-evaluation of patient's condition and review of old charts   Medications  pantoprozole (PROTONIX) 80 mg /NS 100 mL infusion (has no administration in time range)  pantoprazole (PROTONIX) injection 40 mg (has no administration in time range)  pantoprazole  (PROTONIX) 80 mg /NS 100 mL IVPB (0 mg Intravenous Stopped 08/27/22 0655)  sodium chloride 0.9 % bolus 1,000 mL (1,000 mLs Intravenous New Bag/Given 08/27/22 0630)  metoCLOPramide (REGLAN) injection 10 mg (10 mg Intravenous Given 08/27/22 0631)     IMPRESSION / MDM / [place] / ED COURSE  I reviewed the triage vital signs and the nursing notes.  Differential diagnosis includes, but is not limited to, upper GI bleed, lower GI bleed, hemoptysis  {Patient presents with symptoms of an acute illness or injury that is potentially life-threatening.  88 year old woman with previous history of gastric ulcers, currently not on a PPI, presents with upper GI bleeding requiring medical admission.  She is tachycardic but hemodynamically stable.  Hemoglobin the slight decrease from normal.  Normal INR and PTT.  We will admit after metabolic panel returns.  PPI started.      FINAL CLINICAL IMPRESSION(S) / ED DIAGNOSES   Final diagnoses:  Upper GI bleed     Rx / DC Orders   ED Discharge Orders     None        Note:  This document was prepared using Dragon voice recognition software and may include unintentional dictation errors.   Vladimir Crofts, MD 08/27/22 [phone]

## 2022-08-27 NOTE — ED Notes (Signed)
Dr. Toledo at bedside.  

## 2022-08-27 NOTE — Assessment & Plan Note (Signed)
Hold amlodipine for now.

## 2022-08-27 NOTE — Progress Notes (Signed)
C/o 8/10 abd pain. Blood pressure elevated.Dr. Janeann Forehand present. Encouraged passing gas. Fentanyl 25 mcg IV slow.

## 2022-08-27 NOTE — Interval H&P Note (Signed)
History and Physical Interval Note:  08/27/2022 12:03 PM  Regina Hernandez  has presented today for surgery, with the diagnosis of ugi bleed, acute blood loss anemia.  The various methods of treatment have been discussed with the patient and family. After consideration of risks, benefits and other options for treatment, the patient has consented to  Procedure(s): ESOPHAGOGASTRODUODENOSCOPY (EGD) (N/A) as a surgical intervention.  The patient's history has been reviewed, patient examined, no change in status, stable for surgery.  I have reviewed the patient's chart and labs.  Questions were answered to the patient's satisfaction.     [place], [place]

## 2022-08-27 NOTE — Assessment & Plan Note (Signed)
Patient is on multiple antipsychotic agents which are currently on hold since she is n.p.o. and will be resumed when appropriate.

## 2022-08-27 NOTE — Anesthesia Postprocedure Evaluation (Signed)
Anesthesia Post Note  Patient: Regina Hernandez  Procedure(s) Performed: ESOPHAGOGASTRODUODENOSCOPY (EGD)  Patient location during evaluation: PACU Anesthesia Type: General Level of consciousness: sedated Pain management: pain level not controlled Vital Signs Assessment: post-procedure vital signs reviewed and stable Respiratory status: spontaneous breathing and respiratory function stable Cardiovascular status: stable Anesthetic complications: no   No notable events documented.   Last Vitals:  Vitals:   08/27/22 1305 08/27/22 1314  BP: (!) 182/93 (!) 137/125  Pulse: 89 84  Resp: 16 20  Temp: (!) 35.8 C   SpO2: 100% 96%    Last Pain:  Vitals:   08/27/22 1314  TempSrc:   PainSc: 8                  VAN STAVEREN,Ashlinn Hemrick

## 2022-08-27 NOTE — Progress Notes (Signed)
Blood PRBC finished infusing, site clear and no reaction.

## 2022-08-27 NOTE — ED Notes (Signed)
Emergent blood started. Rate started at 500 ml/hr due to Emergency.

## 2022-08-27 NOTE — Op Note (Signed)
[facility] Patient Name: Regina Hernandez Procedure Date: 08/27/2022 11:55 AM MRN: [id number] Account #: [id number] Date of Birth: 01-May-1934 Admit Type: Inpatient Age: 88 Room: [facility] ENDO ROOM 3 Gender: Female Note Status: Finalized Instrument Name: Bleederscope [id number] Procedure:             Upper GI endoscopy Indications:           Acute post hemorrhagic anemia, Hematemesis, Active                         gastrointestinal bleeding Providers:             Benay Pike. Alice Reichert MD, MD Referring MD:          Jobe Marker. Einar Pheasant (Referring MD) Medicines:             Propofol per Anesthesia Complications:         No immediate complications. Estimated blood loss:                         Minimal. Procedure:             Pre-Anesthesia Assessment:                        - The risks and benefits of the procedure and the                         sedation options and risks were discussed with the                         patient. All questions were answered and informed                         consent was obtained.                        - Patient identification and proposed procedure were                         verified prior to the procedure by the nurse. The                         procedure was verified in the procedure room.                        - ASA Grade Assessment: III - A patient with severe                         systemic disease.                        - After reviewing the risks and benefits, the patient                         was deemed in satisfactory condition to undergo the                         procedure.                        After obtaining informed consent, the endoscope  was                         passed under direct vision. Throughout the procedure,                         the patient's blood pressure, pulse, and oxygen                         saturations were monitored continuously. The Endoscope                         was introduced  through the mouth, and advanced to the                         third part of duodenum. The upper GI endoscopy was                         somewhat difficult due to excessive bleeding.                         Successful completion of the procedure was aided by                         lavage. The patient tolerated the procedure well. Findings:      The esophagus was normal.      Hematin (altered blood/coffee-ground-like material) was found in the       gastric fundus. Clearing of fluid via suctioning with endoscope,.       Estimated blood loss: none.      Two spurting cratered duodenal ulcers with a visible vessel were found       in the second portion of the duodenum. The largest lesion was 10 mm in       largest dimension. Area was unsuccessfully injected with 3 mL of a       1:10,000 solution of epinephrine for hemostasis. Coagulation for       hemostasis using bipolar probe was successful. Estimated blood loss was       minimal.      The exam was otherwise without abnormality. Impression:            - Normal esophagus.                        - Hematin (altered blood/coffee-ground-like material)                         in the gastric fundus.                        - Spurting duodenal ulcers with a visible vessel.                         Treatment not successful. Treated with bipolar cautery.                        - The examination was otherwise normal.                        - No specimens collected. Recommendation:        -  Return patient to ICU for ongoing care.                        - NPO today.                        - Serial H/H, Serial exams                        - The findings and recommendations were discussed with                         the patient's family.                        - Give Protonix (pantoprazole): initiate therapy with                         80 mg IV bolus, then 8 mg/hr IV by continuous infusion. Procedure Code(s):     --- Professional ---                         [phone], Esophagogastroduodenoscopy, flexible,                         transoral; with control of bleeding, any method Diagnosis Code(s):     --- Professional ---                        K92.0, Hematemesis                        D62, Acute posthemorrhagic anemia                        K26.4, Chronic or unspecified duodenal ulcer with                         hemorrhage                        K92.2, Gastrointestinal hemorrhage, unspecified CPT copyright 2019 American Medical Association. All rights reserved. The codes documented in this report are preliminary and upon coder review may  be revised to meet current compliance requirements. Efrain Sella MD, MD 08/27/2022 1:09:06 PM This report has been signed electronically. Number of Addenda: 0 Note Initiated On: 08/27/2022 11:55 AM Estimated Blood Loss:  Estimated blood loss was minimal.      [facility]

## 2022-08-27 NOTE — Transfer of Care (Signed)
Immediate Anesthesia Transfer of Care Note  Patient: Regina Hernandez  Procedure(s) Performed: ESOPHAGOGASTRODUODENOSCOPY (EGD)  Patient Location: PACU  Anesthesia Type:General  Level of Consciousness: awake and alert   Airway & Oxygen Therapy: Patient Spontanous Breathing and Patient connected to nasal cannula oxygen  Post-op Assessment: Report given to RN and Post -op Vital signs reviewed and stable  Post vital signs: Reviewed and stable  Last Vitals:  Vitals Value Taken Time  BP 182/93 08/27/22 1305  Temp 35.8 C 08/27/22 1305  Pulse 89 08/27/22 1305  Resp 16 08/27/22 1305  SpO2 100 % 08/27/22 1305    Last Pain:  Vitals:   08/27/22 1305  TempSrc: Temporal  PainSc:          Complications: No notable events documented.

## 2022-08-27 NOTE — ED Notes (Signed)
Regina Hernandez from endo here to get pt.

## 2022-08-27 NOTE — Progress Notes (Signed)
Portable KUB completed. Pain level 3/10. Daughter at bedside, patient resting with eyes closed.

## 2022-08-27 NOTE — ED Notes (Addendum)
Pt vomited bright red blood 200-300 ml and also had another bright red bloody BM, MD aware and states to give another unit of blood emergently

## 2022-08-27 NOTE — H&P (Signed)
History and Physical    PatientAdisyn Hernandez FKC:[id number] DOB: [birth year] DOA: 08/27/2022 DOS: the patient was seen and examined on 08/27/2022 PCP: Lesleigh Noe, MD  Patient coming from: Home  Chief Complaint:  Chief Complaint  Patient presents with   Hematemesis   Most of the history was obtained from patient's daughter who was at the bedside. HPI: Regina Hernandez is a 88 y.o. female with medical history significant for depression, anxiety, essential tremor, bradycardia status post permanent pacemaker insertion, history of peptic ulcer disease who was brought into the ER by her daughter for evaluation of coffee-ground emesis. Patient's daughter states that she woke her up in the morning at about 3 AM with complaints of feeling nauseous.  She gave her oral Zofran and assumed her symptoms are related to anxiety since they had a trip planned for the morning.  Not too long after that patient had an episode of coffee-ground emesis and complained of abdominal pain mostly in the periumbilical area.  Pain is non radiating and patient rated about a 5 x 10 in intensity at its worst.  She only takes aspirin 81 mg daily and denies any NSAID use. Patient had 1 more episode of coffee-ground emesis at home prior to coming to the ER and since she has been in the ER she has had several more episodes of vomiting bright red blood as well as 1 large melena stool. She complains of feeling dizzy and did have a blood pressure in the 74B systolic.  She has received 2 fluid boluses and will be transfused 1 unit of packed RBC. She denies having any chest pain, no shortness of breath, no headache, no blurred vision, no focal deficits, no recent falls. She has been started on a Protonix drip.   Review of Systems: As mentioned in the history of present illness. All other systems reviewed and are negative. Past Medical History:  Diagnosis Date   Anxiety    Cervical dystonia    Essential tremor    GI bleed     Memory loss    Migraine    Occipital neuralgia    Past Surgical History:  Procedure Laterality Date   BOWEL RESECTION     CARDIAC CATHETERIZATION  2016   no stents/[facility]   ESOPHAGOGASTRODUODENOSCOPY N/A 06/18/2020   Procedure: ESOPHAGOGASTRODUODENOSCOPY (EGD);  Surgeon: Toledo, Benay Pike, MD;  Location: [facility] ENDOSCOPY;  Service: Gastroenterology;  Laterality: N/A;   PACEMAKER IMPLANT     REPLACEMENT TOTAL KNEE Left    Social History:  reports that she has never smoked. She has never been exposed to tobacco smoke. She has never used smokeless tobacco. She reports that she does not currently use alcohol. She reports that she does not use drugs.  Allergies  Allergen Reactions   Adhesive [Tape] Other (See Comments)    Contact dermatitis   Baclofen Rash   Lyrica [Pregabalin] Other (See Comments)    Unable to remember reaction.   Sulfa Antibiotics Rash    Family History  Problem Relation Age of Onset   Other Mother        "old age"   Ulcers Father    Stomach cancer Brother     Prior to Admission medications   Medication Sig Start Date End Date Taking? Authorizing Provider  acetaminophen (TYLENOL) 500 MG tablet Take 2 tablets (1,000 mg total) by mouth every 8 (eight) hours as needed for mild pain or moderate pain. 06/19/20  Yes Barb Merino, MD  amLODipine (NORVASC) 2.5  MG tablet Take 1 tablet (2.5 mg total) by mouth daily. 07/19/22  Yes Furth, Cadence H, PA-C  ARIPiprazole (ABILIFY) 2 MG tablet Take 2 mg by mouth every morning. 02/03/21  Yes [provider]  aspirin EC 81 MG tablet Take 81 mg by mouth daily. Swallow whole.   Yes [provider]  atorvastatin (LIPITOR) 10 MG tablet Take 1 tablet (10 mg total) by mouth daily. 08/12/22  Yes Lesleigh Noe, MD  busPIRone (BUSPAR) 10 MG tablet Take 10 mg by mouth 2 (two) times daily. 08/15/22  Yes [provider]  Cholecalciferol (VITAMIN D3 PO) Take 25 mcg by mouth daily.   Yes [provider]  docusate sodium (COLACE) 100 MG capsule Take 100 mg by mouth daily.   Yes [provider]  estradiol (ESTRACE) 0.1 MG/GM vaginal cream Place 1 Applicatorful vaginally as needed.   Yes [provider]  gabapentin (NEURONTIN) 400 MG capsule Take 1 capsule (400 mg total) by mouth 2 (two) times daily. 10/27/21  Yes Marcial Pacas, MD  melatonin 5 MG TABS Take 5 mg by mouth.   Yes [provider]  memantine (NAMENDA) 10 MG tablet Take 1 tablet (10 mg total) by mouth 2 (two) times daily. 10/27/21  Yes Marcial Pacas, MD  nitrofurantoin (MACRODANTIN) 100 MG capsule Take 1 capsule (100 mg total) by mouth daily. 08/16/22  Yes MacDiarmid, Nicki Reaper, MD  primidone (MYSOLINE) 50 MG tablet Take 100 mg by mouth at bedtime.   Yes [provider]  venlafaxine XR (EFFEXOR-XR) 75 MG 24 hr capsule Take 150 mg by mouth daily with breakfast. 06/28/20  Yes [provider]  vitamin B-12 (CYANOCOBALAMIN) 1000 MCG tablet Take 1,000 mcg by mouth daily.   Yes [provider]  fluvoxaMINE (LUVOX) 50 MG tablet Take 50 mg by mouth at bedtime. Patient not taking: Reported on 08/27/2022    [provider]    Physical Exam: Vitals:   08/27/22 0945 08/27/22 0958 08/27/22 1000 08/27/22 1015  BP: (!) 131/92  110/84 110/77  Pulse: (!) 106 (!) 113 (!) 114 (!) 112  Resp: 17 (!) 24 (!) 25 20  Temp: 97.9 F (36.6 C)  97.9 F (36.6 C) 97.9 F (36.6 C)  TempSrc: Oral  Oral Oral  SpO2: 98% 97% 98% 98%  Weight:      Height:       Physical Exam Vitals and nursing note reviewed.  Constitutional:      Comments: Patient appears very pale and anxious.  HENT:     Head: Normocephalic and atraumatic.     Nose: Nose normal.     Mouth/Throat:     Mouth: Mucous membranes are moist.  Eyes:     Comments: Pale conjunctiva  Cardiovascular:     Rate and Rhythm: Tachycardia present.  Pulmonary:     Effort: Pulmonary effort is normal.     Breath sounds: Normal breath sounds.   Abdominal:     General: Bowel sounds are normal.     Palpations: Abdomen is soft.     Tenderness: There is abdominal tenderness.     Comments: Tenderness in the periumbilical area  Musculoskeletal:        General: Normal range of motion.     Cervical back: Normal range of motion and neck supple.  Skin:    General: Skin is warm and dry.  Neurological:     Mental Status: She is alert.     Motor: Weakness present.  Psychiatric:  Comments: Anxious     Data Reviewed: Relevant notes from primary care and specialist visits, past discharge summaries as available in EHR, including Care Everywhere. Prior diagnostic testing as pertinent to current admission diagnoses Updated medications and problem lists for reconciliation ED course, including vitals, labs, imaging, treatment and response to treatment Triage notes, nursing and pharmacy notes and ED provider's notes Notable results as noted in HPI Labs reviewed.  Hemoglobin 11.4 >> 8.6, white count 11.9, hematocrit 36, platelet count 194, sodium 139, potassium 5.1, chloride 108, bicarb 26, glucose 122, BUN 41, creatinine 1.01, calcium 9.8, total protein 6.9, AST 23, ALT 13, alkaline phosphatase 53 Twelve-lead EKG reviewed by me shows sinus tachycardia There are no new results to review at this time.  Assessment and Plan: * Acute blood loss anemia (ABLA) Secondary to GI bleed Patient presents for evaluation of several episodes of hematemesis and passage of melena stools and noted to have a drop in her H&H from 11.4 >> 8.6 Patient became hypotensive with systolic blood pressure in the 80s We will transfuse 1 unit of packed RBC Continue aggressive IV fluid resuscitation Check serial H&H and transfuse as needed  GI bleed Most likely upper GI bleed from PUD Patient presents to the ER for evaluation of coffee-ground emesis and melena stools Has a history of PUD Continue Protonix drip initiated in the ER Check serial H&H and transfuse as  needed GI consult  Major depression with psychotic features [facility]) Patient is on multiple antipsychotic agents which are currently on hold since she is n.p.o. and will be resumed when appropriate.  Essential hypertension Hold amlodipine for now  Mild cognitive impairment with memory loss Resume Namenda when appropriate      Advance Care Planning:   Code Status: DNR   Consults: Gastroenterology, critical care  Family Communication: Greater than 50% of time was spent discussing patient's condition and plan of care with her daughter at the bedside.  All questions and concerns have been addressed.  She verbalizes understanding and agrees with the plan.  CODE STATUS was discussed and patient is a DO NOT RESUSCITATE.  Severity of Illness: The appropriate patient status for this patient is INPATIENT. Inpatient status is judged to be reasonable and necessary in order to provide the required intensity of service to ensure the patient's safety. The patient's presenting symptoms, physical exam findings, and initial radiographic and laboratory data in the context of their chronic comorbidities is felt to place them at high risk for further clinical deterioration. Furthermore, it is not anticipated that the patient will be medically stable for discharge from the hospital within 2 midnights of admission.   * I certify that at the point of admission it is my clinical judgment that the patient will require inpatient hospital care spanning beyond 2 midnights from the point of admission due to high intensity of service, high risk for further deterioration and high frequency of surveillance required.*  Author: Collier Bullock, MD 08/27/2022 10:25 AM  For on call review [email or web address].

## 2022-08-27 NOTE — Anesthesia Preprocedure Evaluation (Signed)
Anesthesia Evaluation  Patient identified by MRN, date of birth, ID band Patient awake and Patient confused    Reviewed: Allergy & Precautions, NPO status , Patient's Chart, lab work & pertinent test results  Airway Mallampati: III  TM Distance: >3 FB Neck ROM: Limited    Dental  (+) Upper Dentures, Lower Dentures   Pulmonary neg pulmonary ROS,    Pulmonary exam normal  + decreased breath sounds      Cardiovascular Exercise Tolerance: Poor hypertension, Pt. on medications + CAD  negative cardio ROS Normal cardiovascular exam+ pacemaker  Rhythm:Regular     Neuro/Psych  Headaches, Dementia negative neurological ROS  negative psych ROS   GI/Hepatic negative GI ROS, Neg liver ROS, GI bleed   Endo/Other  negative endocrine ROS  Renal/GU negative Renal ROS  negative genitourinary   Musculoskeletal   Abdominal (+) + obese,   Peds negative pediatric ROS (+)  Hematology negative hematology ROS (+) Blood dyscrasia, anemia ,   Anesthesia Other Findings Past Medical History: No date: Anxiety No date: Cervical dystonia No date: Essential tremor No date: GI bleed No date: Memory loss No date: Migraine No date: Occipital neuralgia  Past Surgical History: No date: BOWEL RESECTION 2016: CARDIAC CATHETERIZATION     Comment:  no stents/[facility] 06/18/2020: ESOPHAGOGASTRODUODENOSCOPY; N/A     Comment:  Procedure: ESOPHAGOGASTRODUODENOSCOPY (EGD);  Surgeon:               Toledo, Benay Pike, MD;  Location: [facility] ENDOSCOPY;                Service: Gastroenterology;  Laterality: N/A; No date: PACEMAKER IMPLANT No date: REPLACEMENT TOTAL KNEE; Left  BMI    Body Mass Index: 28.35 kg/m      Reproductive/Obstetrics negative OB ROS                             Anesthesia Physical Anesthesia Plan  ASA: 3 and emergent  Anesthesia Plan: General   Post-op Pain Management:     Induction: Intravenous  PONV Risk Score and Plan: Propofol infusion and TIVA  Airway Management Planned: Natural Airway  Additional Equipment:   Intra-op Plan:   Post-operative Plan:   Informed Consent: I have reviewed the patients History and Physical, chart, labs and discussed the procedure including the risks, benefits and alternatives for the proposed anesthesia with the patient or authorized representative who has indicated his/her understanding and acceptance.     Dental Advisory Given  Plan Discussed with: CRNA and Surgeon  Anesthesia Plan Comments:         Anesthesia Quick Evaluation

## 2022-08-28 ENCOUNTER — Inpatient Hospital Stay: Payer: Medicare Other | Admitting: Certified Registered"

## 2022-08-28 ENCOUNTER — Encounter (HOSPITAL_COMMUNITY): Payer: Self-pay

## 2022-08-28 ENCOUNTER — Inpatient Hospital Stay (HOSPITAL_COMMUNITY)
Admit: 2022-08-28 | Discharge: 2022-09-03 | DRG: 356 | Disposition: A | Discharge status: Home Health | Payer: Medicare Other | Source: Ambulatory Visit | Attending: Internal Medicine | Admitting: Internal Medicine

## 2022-08-28 ENCOUNTER — Other Ambulatory Visit (HOSPITAL_COMMUNITY): Payer: Self-pay | Admitting: Interventional Radiology

## 2022-08-28 ENCOUNTER — Encounter: Payer: Self-pay | Admitting: Internal Medicine

## 2022-08-28 ENCOUNTER — Inpatient Hospital Stay: Payer: Medicare Other

## 2022-08-28 ENCOUNTER — Encounter
Admission: EM | Disposition: A | Discharge status: Other Acute Inpatient Hospital | Payer: Self-pay | Source: Home / Self Care | Attending: Internal Medicine

## 2022-08-28 DIAGNOSIS — K260 Acute duodenal ulcer with hemorrhage: Secondary | ICD-10-CM | POA: Diagnosis present

## 2022-08-28 DIAGNOSIS — E8729 Other acidosis: Secondary | ICD-10-CM | POA: Diagnosis present

## 2022-08-28 DIAGNOSIS — R578 Other shock: Secondary | ICD-10-CM | POA: Diagnosis present

## 2022-08-28 DIAGNOSIS — K922 Gastrointestinal hemorrhage, unspecified: Secondary | ICD-10-CM | POA: Diagnosis present

## 2022-08-28 DIAGNOSIS — Z91048 Other nonmedicinal substance allergy status: Secondary | ICD-10-CM

## 2022-08-28 DIAGNOSIS — Z66 Do not resuscitate: Secondary | ICD-10-CM | POA: Diagnosis present

## 2022-08-28 DIAGNOSIS — E878 Other disorders of electrolyte and fluid balance, not elsewhere classified: Secondary | ICD-10-CM | POA: Diagnosis present

## 2022-08-28 DIAGNOSIS — E8770 Fluid overload, unspecified: Secondary | ICD-10-CM | POA: Diagnosis not present

## 2022-08-28 DIAGNOSIS — K264 Chronic or unspecified duodenal ulcer with hemorrhage: Secondary | ICD-10-CM

## 2022-08-28 DIAGNOSIS — E440 Moderate protein-calorie malnutrition: Secondary | ICD-10-CM | POA: Diagnosis present

## 2022-08-28 DIAGNOSIS — E876 Hypokalemia: Secondary | ICD-10-CM | POA: Diagnosis not present

## 2022-08-28 DIAGNOSIS — Z950 Presence of cardiac pacemaker: Secondary | ICD-10-CM

## 2022-08-28 DIAGNOSIS — I10 Essential (primary) hypertension: Secondary | ICD-10-CM | POA: Diagnosis present

## 2022-08-28 DIAGNOSIS — R600 Localized edema: Secondary | ICD-10-CM | POA: Diagnosis present

## 2022-08-28 DIAGNOSIS — Z96652 Presence of left artificial knee joint: Secondary | ICD-10-CM | POA: Diagnosis present

## 2022-08-28 DIAGNOSIS — I495 Sick sinus syndrome: Secondary | ICD-10-CM | POA: Diagnosis present

## 2022-08-28 DIAGNOSIS — R54 Age-related physical debility: Secondary | ICD-10-CM | POA: Diagnosis present

## 2022-08-28 DIAGNOSIS — R7989 Other specified abnormal findings of blood chemistry: Secondary | ICD-10-CM | POA: Diagnosis not present

## 2022-08-28 DIAGNOSIS — Z6830 Body mass index (BMI) 30.0-30.9, adult: Secondary | ICD-10-CM

## 2022-08-28 DIAGNOSIS — G3184 Mild cognitive impairment, so stated: Secondary | ICD-10-CM | POA: Diagnosis present

## 2022-08-28 DIAGNOSIS — R531 Weakness: Secondary | ICD-10-CM | POA: Diagnosis not present

## 2022-08-28 DIAGNOSIS — K449 Diaphragmatic hernia without obstruction or gangrene: Secondary | ICD-10-CM | POA: Diagnosis present

## 2022-08-28 DIAGNOSIS — Z888 Allergy status to other drugs, medicaments and biological substances status: Secondary | ICD-10-CM

## 2022-08-28 DIAGNOSIS — I482 Chronic atrial fibrillation, unspecified: Secondary | ICD-10-CM | POA: Diagnosis present

## 2022-08-28 DIAGNOSIS — D696 Thrombocytopenia, unspecified: Secondary | ICD-10-CM | POA: Diagnosis present

## 2022-08-28 DIAGNOSIS — F419 Anxiety disorder, unspecified: Secondary | ICD-10-CM | POA: Diagnosis present

## 2022-08-28 DIAGNOSIS — Z79899 Other long term (current) drug therapy: Secondary | ICD-10-CM | POA: Diagnosis not present

## 2022-08-28 DIAGNOSIS — Z882 Allergy status to sulfonamides status: Secondary | ICD-10-CM

## 2022-08-28 DIAGNOSIS — Z515 Encounter for palliative care: Secondary | ICD-10-CM | POA: Diagnosis not present

## 2022-08-28 DIAGNOSIS — F32A Depression, unspecified: Secondary | ICD-10-CM | POA: Diagnosis present

## 2022-08-28 DIAGNOSIS — D62 Acute posthemorrhagic anemia: Secondary | ICD-10-CM | POA: Diagnosis present

## 2022-08-28 DIAGNOSIS — K920 Hematemesis: Secondary | ICD-10-CM | POA: Diagnosis not present

## 2022-08-28 HISTORY — PX: ESOPHAGOGASTRODUODENOSCOPY (EGD) WITH PROPOFOL: SHX5813

## 2022-08-28 LAB — HEMOGLOBIN AND HEMATOCRIT, BLOOD
HCT: 27.1 % — ABNORMAL LOW (ref 36.0–46.0)
HCT: 26.7 % — ABNORMAL LOW (ref 36.0–46.0)
HCT: 28.4 % — ABNORMAL LOW (ref 36.0–46.0)
HCT: 26 % — ABNORMAL LOW (ref 36.0–46.0)
Hemoglobin: 8.7 g/dL — ABNORMAL LOW (ref 12.0–15.0)
Hemoglobin: 8.7 g/dL — ABNORMAL LOW (ref 12.0–15.0)
Hemoglobin: 9.2 g/dL — ABNORMAL LOW (ref 12.0–15.0)
Hemoglobin: 8.6 g/dL — ABNORMAL LOW (ref 12.0–15.0)

## 2022-08-28 LAB — BASIC METABOLIC PANEL
Anion gap: 4 — ABNORMAL LOW (ref 5–15)
BUN: 29 mg/dL — ABNORMAL HIGH (ref 8–23)
CO2: 23 mmol/L (ref 22–32)
Calcium: 7.9 mg/dL — ABNORMAL LOW (ref 8.9–10.3)
Chloride: 114 mmol/L — ABNORMAL HIGH (ref 98–111)
Creatinine, Ser: 0.74 mg/dL (ref 0.44–1.00)
GFR, Estimated: >60 mL/min (ref >60)
Glucose, Bld: 99 mg/dL (ref 70–99)
Potassium: 4.3 mmol/L (ref 3.5–5.1)
Sodium: 141 mmol/L (ref 135–145)

## 2022-08-28 SURGERY — ESOPHAGOGASTRODUODENOSCOPY (EGD) WITH PROPOFOL
Anesthesia: General

## 2022-08-28 MED ORDER — ACETAMINOPHEN 325 MG PO TABS
650.0000 mg | ORAL_TABLET | Freq: Four times a day (QID) | ORAL | Status: DC | PRN
  Administered 2022-08-28: 650 mg via ORAL
  Filled 2022-08-28: qty 2

## 2022-08-28 MED ORDER — PROPOFOL 10 MG/ML IV BOLUS
INTRAVENOUS | Status: DC | PRN
  Administered 2022-08-28: 40 mg via INTRAVENOUS

## 2022-08-28 MED ORDER — PROPOFOL 10 MG/ML IV BOLUS: INTRAVENOUS | Status: DC | PRN

## 2022-08-28 MED ORDER — PROPOFOL 1000 MG/100ML IV EMUL
INTRAVENOUS | Status: AC
  Filled 2022-08-28: qty 100

## 2022-08-28 MED ORDER — ESMOLOL HCL 100 MG/10ML IV SOLN
INTRAVENOUS | Status: AC
  Filled 2022-08-28: qty 10

## 2022-08-28 MED ORDER — LIDOCAINE HCL (CARDIAC) PF 100 MG/5ML IV SOSY
PREFILLED_SYRINGE | INTRAVENOUS | Status: DC | PRN
  Administered 2022-08-28: 100 mg via INTRAVENOUS

## 2022-08-28 MED ORDER — HYDRALAZINE HCL 20 MG/ML IJ SOLN
10.0000 mg | Freq: Four times a day (QID) | INTRAMUSCULAR | Status: DC | PRN
Start: 2022-08-28 — End: 2022-08-28
  Administered 2022-08-28: 10 mg via INTRAVENOUS
  Filled 2022-08-28: qty 1

## 2022-08-28 MED ORDER — PROPOFOL 500 MG/50ML IV EMUL
INTRAVENOUS | Status: DC | PRN
  Administered 2022-08-28: 50 ug/kg/min via INTRAVENOUS

## 2022-08-28 MED ORDER — SODIUM CHLORIDE 0.9 % IV SOLN: INTRAVENOUS | Status: DC

## 2022-08-28 MED ORDER — SODIUM CHLORIDE 0.9 % IV BOLUS
500.0000 mL | Freq: Once | INTRAVENOUS | Status: AC
  Administered 2022-08-28: 500 mL via INTRAVENOUS

## 2022-08-28 MED ORDER — IOHEXOL 350 MG/ML SOLN
100.0000 mL | Freq: Once | INTRAVENOUS | Status: AC | PRN
Start: 2022-08-28 — End: 2022-08-28
  Administered 2022-08-28: 100 mL via INTRAVENOUS

## 2022-08-28 NOTE — Transfer of Care (Signed)
Immediate Anesthesia Transfer of Care Note  Patient: Regina Hernandez  Procedure(s) Performed: ESOPHAGOGASTRODUODENOSCOPY (EGD) WITH PROPOFOL  Patient Location: PACU and ICU  Anesthesia Type:General  Level of Consciousness: awake and patient cooperative  Airway & Oxygen Therapy: Patient Spontanous Breathing and Patient connected to nasal cannula oxygen  Post-op Assessment: Report given to RN and Post -op Vital signs reviewed and stable  Post vital signs: Reviewed  Last Vitals:  Vitals Value Taken Time  BP 184/110 08/28/22 1306  Temp    Pulse 100 08/28/22 1318  Resp 14 08/28/22 1318  SpO2 100 % 08/28/22 1318  Vitals shown include unvalidated device data.  Last Pain:  Vitals:   08/28/22 1235  TempSrc: Temporal  PainSc: 0-No pain         Complications: No notable events documented.

## 2022-08-28 NOTE — Progress Notes (Addendum)
Patient was transported back to ICU at 1305 by Endo RN and CRNA post procedure due to a brief episode of ventricular tachycardia during the procedure. Patient was assessed by Anesthesiologist. Patient's primary RN was notified by telephone prior to transport.

## 2022-08-28 NOTE — Op Note (Signed)
[facility] Gastroenterology Patient Name: Regina Hernandez Procedure Date: 08/28/2022 12:36 PM MRN: [id number] Account #: [id number] Date of Birth: 02-Jul-1934 Admit Type: Inpatient Age: 88 Room: [facility] ENDO ROOM 4 Gender: Female Note Status: Finalized Instrument Name: Upper Endoscope [id number] Procedure:             Upper GI endoscopy Indications:           Recent gastrointestinal bleeding, Follow-up of acute                         duodenal ulcer with hemorrhage Providers:             Benay Pike. Alice Reichert MD, MD Referring MD:          Jobe Marker. Einar Pheasant (Referring MD) Medicines:             Propofol per Anesthesia Complications:         Non-sustained Ventricular Tachycardia Procedure:             Pre-Anesthesia Assessment:                        - The risks and benefits of the procedure and the                         sedation options and risks were discussed with the                         patient. All questions were answered and informed                         consent was obtained.                        - Patient identification and proposed procedure were                         verified prior to the procedure by the nurse. The                         procedure was verified in the procedure room.                        - ASA Grade Assessment: IV - A patient with severe                         systemic disease that is a constant threat to life.                        - After reviewing the risks and benefits, the patient                         was deemed in satisfactory condition to undergo the                         procedure.                        After obtaining informed consent, the endoscope was  passed under direct vision. Throughout the procedure,                         the patient's blood pressure, pulse, and oxygen                         saturations were monitored continuously. The Endoscope                         was introduced  through the mouth, and advanced to the                         third part of duodenum. The upper GI endoscopy was                         accomplished without difficulty. The patient tolerated                         the procedure fairly well. Findings:      The esophagus was normal.      The entire examined stomach was normal.      One oozing linear duodenal ulcer (previously hemostased on 08/28/22) with       a visible vessel was found in the second portion of the duodenum. The       lesion was 9 mm in largest dimension. Coagulation for hemostasis using       bipolar probe was successful. Estimated blood loss was minimal.      One non-bleeding cratered duodenal ulcer with no stigmata of bleeding       was found in the second portion of the duodenum. The lesion was 11 mm in       largest dimension. Powder burn area on this ulcer suggesting recent       endoscopic thermal hemostasis. Again, no stigmata of bleeding. No       intervention required on second ulcer.      The exam was otherwise without abnormality. Impression:            - Normal esophagus.                        - Normal stomach.                        - Oozing duodenal ulcer with a visible vessel. Treated                         with bipolar cautery.                        - Non-bleeding duodenal ulcer with no stigmata of                         bleeding.                        - The examination was otherwise normal.                        - No specimens collected. Recommendation:        - Return patient to ICU for ongoing care.                        -  patient being IV loaded with Lidocaine for NSVT.                         Further recommendations per ICU team.                        - Serial H/H, Serial exams                        - Continue present medications.                        - Clear liquid diet.                        - The findings and recommendations were discussed with                         the patient  and their family. Procedure Code(s):     --- Professional ---                        [phone], Esophagogastroduodenoscopy, flexible,                         transoral; with control of bleeding, any method Diagnosis Code(s):     --- Professional ---                        K26.0, Acute duodenal ulcer with hemorrhage                        K92.2, Gastrointestinal hemorrhage, unspecified                        K26.9, Duodenal ulcer, unspecified as acute or                         chronic, without hemorrhage or perforation                        K26.4, Chronic or unspecified duodenal ulcer with                         hemorrhage CPT copyright 2019 [employer]. All rights reserved. The codes documented in this report are preliminary and upon coder review may  be revised to meet current compliance requirements. Efrain Sella MD, MD 08/28/2022 1:13:16 PM This report has been signed electronically. Number of Addenda: 0 Note Initiated On: 08/28/2022 12:36 PM Estimated Blood Loss:  Estimated blood loss was minimal. Estimated blood loss                         was minimal. Estimated blood loss was minimal.      [facility]

## 2022-08-28 NOTE — Anesthesia Postprocedure Evaluation (Signed)
Anesthesia Post Note  Patient: Regina Hernandez  Procedure(s) Performed: ESOPHAGOGASTRODUODENOSCOPY (EGD) WITH PROPOFOL  Patient location during evaluation: PACU Anesthesia Type: General Level of consciousness: awake and alert Pain management: pain level controlled Vital Signs Assessment: post-procedure vital signs reviewed and stable Respiratory status: spontaneous breathing, nonlabored ventilation and respiratory function stable Cardiovascular status: blood pressure returned to baseline and stable Postop Assessment: no apparent nausea or vomiting Anesthetic complications: no   No notable events documented.   Last Vitals:  Vitals:   08/28/22 1900 08/28/22 2000  BP: 101/73 101/71  Pulse: (!) 131 (!) 124  Resp: (!) 21 (!) 26  Temp:    SpO2: 98% 97%    Last Pain:  Vitals:   08/28/22 1500  TempSrc: Oral  PainSc: [place]

## 2022-08-28 NOTE — Progress Notes (Addendum)
PROGRESS NOTE    Regina Hernandez  RJJ:[id number] DOB: [birth year] DOA: 08/27/2022  PCP: Lesleigh Noe, MD   Brief Narrative:  This 88 years old female with PMH significant for depression, anxiety, essential tremor, bradycardia s/p permanent pacemaker insertion, history of peptic ulcer disease presented to the ED for the evaluation of coffee-ground emesis.  Daughter reports she woke up in the morning around 3 AM with complaints of feeling nauseous and she has given her Zofran and assumed her symptoms are related to anxiety since they had a trip planned for next morning.  Subsequently patient had an episode of coffee-ground emesis and she complains of having abdominal pain mostly in the periumbilical area.  Patient was brought in the ED and had several more episodes of vomiting bright red blood as well as large black-colored stools.  Patient also reports dizziness and systolic blood pressure was 80 in the ED. Patient was given 2 fluid boluses and was given 2 units of blood transfusion.  GI is consulted,  Patient underwent EGD found to have bleeding ulcers which was cauterized.  She continues to have ongoing bright red vomitus.  GI was notified , Patient underwent repeat EGD.   Assessment & Plan:   Principal Problem:   Acute blood loss anemia (ABLA) Active Problems:   GI bleed   Mild cognitive impairment with memory loss   Essential hypertension   Major depression with psychotic features ([place])   ABLA (acute blood loss anemia)  Acute blood loss anemia secondary to GI bleed: Patient presented for the evaluation of several episodes of hematemesis and passage of black stools and noted to have a hemoglobin drop from 11.4->8.6. Patient was also found to be hypotensive with systolic blood pressure in the 80s , She was given 2 units of packed red blood cells. Continue aggressive IV hydration.   Check H&H q 8hrs.  Patient underwent EGD found to have bleeding duodenal ulcer which was cauterized. She  continues to have large amount of hematemesis.  Patient underwent repeat EGD. Continue Protonix IV gtt.  GI bleed: Most likely upper GI bleed from peptic ulcer disease. Patient does have a history of peptic ulcer disease.   Continue pantoprazole infusion. Monitor H&H.   Major depression: Patient on multiple antipsychotic medications at home which are currently on hold because of n.p.o.  Essential hypertension : Continue amlodipine  Mild cognitive impairment with memory loss: Resume Namenda when appropriate and patient able to take oral.    DVT prophylaxis: SCDs Code Status: DNR Family Communication: No family at bed side. Disposition Plan:    Status is: Inpatient Remains inpatient appropriate because: Admitted for upper GI bleed requiring IV infusion and blood transfusion.  Patient underwent EGD twice,  found to have bleeding duodenal ulcer which was cauterized.  Continue to monitor H&H.    Consultants:  Gastroenterology  Procedures: EGD x2 Antimicrobials: None  Subjective: Patient was seen and examined at bedside.  Overnight events noted.   Patient continues to have large bloody vomiting.  Underwent EGD twice , H and H remains stable.  Objective: Vitals:   08/28/22 0700 08/28/22 0800 08/28/22 1100 08/28/22 1235  BP: (!) 140/93 (!) 149/91 (!) 180/113 (!) 178/92  Pulse: (!) 103 (!) 104 99 97  Resp: (!) 21 (!) 21 (!) 22 20  Temp:  98.9 F (37.2 C)  98.7 F (37.1 C)  TempSrc:  Oral  Temporal  SpO2: 100% 97% 97% 99%  Weight:      Height:  Intake/Output Summary (Last 24 hours) at 08/28/2022 1340 Last data filed at 08/28/2022 0900 Gross per 24 hour  Intake 2343.3 ml  Output 1025 ml  Net 1318.3 ml   Filed Weights   08/27/22 0512  Weight: 70.3 kg    Examination:  General exam: Appears deconditioned, comfortable, not in any acute distress. Respiratory system: CTA bilaterally, no wheezing, no crackles, normal respiratory effort. Cardiovascular system:  S1 & S2 heard, regular rate and rhythm, no murmur. Gastrointestinal system: Abdomen is soft, non tender, non distended, BS+ Central nervous system: Alert and oriented x 3. No focal neurological deficits. Extremities: Edema+, no cyanosis, no clubbing Skin: No rashes, lesions or ulcers Psychiatry: Judgement and insight appear normal. Mood & affect appropriate.     Data Reviewed: I have personally reviewed following labs and imaging studies  CBC: Recent Labs  Lab 08/27/22 0606 08/27/22 0932 08/27/22 2013 08/28/22 0159 08/28/22 0848 08/28/22 1158  WBC 11.9*  --   --   --   --   --   NEUTROABS 10.2*  --   --   --   --   --   HGB 11.4* 8.6* 9.7* 8.7* 8.7* 9.2*  HCT 36.6 28.1* 29.5* 27.1* 26.7* 28.4*  MCV 94.1  --   --   --   --   --   PLT 194  --   --   --   --   --    Basic Metabolic Panel: Recent Labs  Lab 08/27/22 0606 08/28/22 0159  NA 139 141  K 5.1 4.3  CL 108 114*  CO2 26 23  GLUCOSE 122* 99  BUN 41* 29*  CREATININE 1.01* 0.74  CALCIUM 9.8 7.9*   GFR: Estimated Creatinine Clearance: 46.4 mL/min (by C-G formula based on SCr of 0.74 mg/dL). Liver Function Tests: Recent Labs  Lab 08/27/22 0606  AST 23  ALT 13  ALKPHOS 53  BILITOT 1.0  PROT 6.9  ALBUMIN 3.8   Recent Labs  Lab 08/27/22 0606  LIPASE 31   No results for input(s): "AMMONIA" in the last 168 hours. Coagulation Profile: Recent Labs  Lab 08/27/22 0606  INR 1.1   Cardiac Enzymes: No results for input(s): "CKTOTAL", "CKMB", "CKMBINDEX", "TROPONINI" in the last 168 hours. BNP (last 3 results) No results for input(s): "PROBNP" in the last 8760 hours. HbA1C: No results for input(s): "HGBA1C" in the last 72 hours. CBG: No results for input(s): "GLUCAP" in the last 168 hours. Lipid Profile: No results for input(s): "CHOL", "HDL", "LDLCALC", "TRIG", "CHOLHDL", "LDLDIRECT" in the last 72 hours. Thyroid Function Tests: No results for input(s): "TSH", "T4TOTAL", "FREET4", "T3FREE", "THYROIDAB"  in the last 72 hours. Anemia Panel: No results for input(s): "VITAMINB12", "FOLATE", "FERRITIN", "TIBC", "IRON", "RETICCTPCT" in the last 72 hours. Sepsis Labs: No results for input(s): "PROCALCITON", "LATICACIDVEN" in the last 168 hours.  Recent Results (from the past 240 hour(s))  MRSA Next Gen by PCR, Nasal     Status: None   Collection Time: 08/27/22  3:38 PM   Specimen: Nasal Mucosa; Nasal Swab  Result Value Ref Range Status   MRSA by PCR Next Gen NOT DETECTED NOT DETECTED Final    Comment: (NOTE) The GeneXpert MRSA Assay (FDA approved for NASAL specimens only), is one component of a comprehensive MRSA colonization surveillance program. It is not intended to diagnose MRSA infection nor to guide or monitor treatment for MRSA infections. Test performance is not FDA approved in patients less than 38 years old. Performed at [facility]  Lab, [address], [place], [place] [address]     Radiology Studies: DG Abd Portable 2V  Result Date: 08/27/2022 CLINICAL DATA:  Abdominal pain EXAM: PORTABLE ABDOMEN - 2 VIEW COMPARISON:  None Available. FINDINGS: Multiple gas-filled loops of large and small bowel. No abnormal distension. Gas in the rectum. No intraperitoneal free air. Degenerative change of the lumbar spine with dextroscoliosis. No pathologic calcifications. IMPRESSION: 1. Gas filled colon and small bowel without evidence obstruction. No pathologic dilatation. 2. No free air. Electronically Signed   By: Suzy Bouchard M.D.   On: 08/27/2022 14:03    Scheduled Meds:  Chlorhexidine Gluconate Cloth  6 each Topical Daily   metoCLOPramide (REGLAN) injection  10 mg Intravenous Q8H   [START ON 08/30/2022] pantoprazole  40 mg Intravenous Q12H   Continuous Infusions:  sodium chloride 125 mL/hr at 08/28/22 1237   pantoprazole 8 mg/hr (08/28/22 0900)     LOS: 1 day    Time spent: 50 mins    Nitza Schmid, MD [facility]   If 7PM-7AM, please contact night-coverage

## 2022-08-28 NOTE — Progress Notes (Addendum)
0900: Pt had a large, bloody bowel movement with no clots. Dwyane Dee, MD and Alice Reichert, MD notified.  1130: Second bloody bowel movement; maroon colored, with clots. Alice Reichert, MD notified.  1730: Pt with a medium bloody bowel movement; Alice Reichert, MD notified. Pt tachycardic in the 120s-130s; blood pressure decreased to 94/65; Dwyane Dee, MD notified. Orders placed for 521m NS bolus, currently infusing.

## 2022-08-28 NOTE — Discharge Summary (Signed)
Physician Discharge Summary   Patient: Regina Hernandez MRN: [id number] DOB: 12/14/1934  Admit date:     08/27/2022  Discharge date: 08/28/2022  Discharge Physician: Sharion Settler   PCP: Lesleigh Noe, MD   Discharge Diagnoses: Principal Problem:   Acute blood loss anemia (ABLA) Active Problems:   Mild cognitive impairment with memory loss   Essential hypertension   Major depression with psychotic features ([place])   GI bleed   ABLA (acute blood loss anemia)  Resolved Problems:   * No resolved hospital problems. [facility] Course This 88 years old female with PMH significant for depression, anxiety, essential tremor, bradycardia s/p permanent pacemaker insertion, history of peptic ulcer disease presented to the ED for the evaluation of coffee-ground emesis.  Daughter reports she woke up in the morning around 3 AM with complaints of feeling nauseous and she has given her Zofran and assumed her symptoms are related to anxiety since they had a trip planned for next morning.  Subsequently patient had an episode of coffee-ground emesis and she complains of having abdominal pain mostly in the periumbilical area.  Patient was brought in the ED and had several more episodes of vomiting bright red blood as well as large black-colored stools.  Patient also reports dizziness and systolic blood pressure was 80 in the ED. Patient was given 2 fluid boluses and was given 2 units of blood transfusion.  GI is consulted,  Patient underwent EGD found to have bleeding ulcers which was cauterized.  She continues to have ongoing bright red vomitus.  GI was notified , Patient underwent repeat EGD. Patient continued to have  bleeding post EGD with cauterization to the duodenal ulcer. Bleeding scan confirms continued ulcer bleeding.  Dr Bobetta Lime with general surgery consulted who discussed with interventional radiology who have accepted patient at for procedure at [place] and Plan:  Consultants:  Interventional radiology, Gastroenterology, General Surgery,  Procedures performd: EGD  [facility] Diet recommendation:  NPO   DISCHARGE MEDICATION:   Discharge Exam: Filed Weights   08/27/22 0512  Weight: 70.3 kg    Condition at discharge: stable  The results of significant diagnostics from this hospitalization (including imaging, microbiology, ancillary and laboratory) are listed below for reference.   Imaging Studies: CT ANGIO GI BLEED  Result Date: 08/28/2022 CLINICAL DATA:  Duodenal ulcer, gastrointestinal hemorrhage, coffee-ground emesis EXAM: CTA ABDOMEN AND PELVIS WITHOUT AND WITH CONTRAST TECHNIQUE: Multidetector CT imaging of the abdomen and pelvis was performed using the standard protocol during bolus administration of intravenous contrast. Multiplanar reconstructed images and MIPs were obtained and reviewed to evaluate the vascular anatomy. RADIATION DOSE REDUCTION: This exam was performed according to the departmental dose-optimization program which includes automated exposure control, adjustment of the mA and/or kV according to patient size and/or use of iterative reconstruction technique. CONTRAST:  167m OMNIPAQUE IOHEXOL 350 MG/ML SOLN COMPARISON:  None Available. FINDINGS: VASCULAR Aorta: Normal caliber aorta without aneurysm, dissection, vasculitis or significant stenosis. Mild atherosclerotic calcification Celiac: Mild mass effect related to the median arcuate ligament without hemodynamically significant stenosis. Variant anatomy with the common hepatic artery giving rise to the gastroduodenal artery and left hepatic artery. Accessory left hepatic artery noted. No aneurysm or dissection. SMA: Widely patent. Replaced right hepatic artery. No aneurysm or dissection. Renals: Dual right and single left renal arteries. Superior most right renal artery demonstrates a high-grade (greater than 75%) stenosis at its origin. Additionally, the vessel demonstrates a  beaded appearance within its mid  segment in keeping with changes of fibromuscular dysplasia. Remaining renal arteries are widely patent and demonstrate normal vascular morphology. No aneurysm or dissection. IMA: Diminutive, but patent. Inflow: Patent without evidence of aneurysm, dissection, vasculitis or significant stenosis. Proximal Outflow: Bilateral common femoral and visualized portions of the superficial and profunda femoral arteries are patent without evidence of aneurysm, dissection, vasculitis or significant stenosis. Veins: Unremarkable. Review of the MIP images confirms the above findings. NON-VASCULAR Lower chest: Visualized lung bases are clear bilaterally. Pacemaker leads are seen within the right heart. Small hiatal hernia. Hepatobiliary: No focal liver abnormality is seen. Status post cholecystectomy. No biliary dilatation. Pancreas: 14 mm simple cyst noted within the body of the pancreas demonstrating no enhancement or internal architecture. The pancreas is otherwise unremarkable. Spleen: Unremarkable Adrenals/Urinary Tract: Bilateral benign adrenal adenomas measuring 13 mm in greatest dimension. The kidneys are normal in size and position. No hydronephrosis. No intrarenal or ureteral calculi. The bladder is unremarkable. Stomach/Bowel: There is active extravasation involving the second portion of the duodenum just above the ampulla. This is best seen on coronal image # 57, series 9. Surgical changes of right hemicolectomy are identified. The stomach, small bowel, and large bowel are otherwise unremarkable. No free intraperitoneal gas or fluid. Lymphatic: No pathologic adenopathy within the abdomen and pelvis. Reproductive: Status post hysterectomy. No adnexal masses. Other: No abdominal wall hernia Musculoskeletal: No acute bone abnormality. Osseous structures are age-appropriate. IMPRESSION: Active extravasation involving second portion of the duodenum. Fibromuscular dysplasia involving a  superior accessory right renal artery. Superimposed hemodynamically significant stenosis this vessel origin 14 mm simple cyst within the body of the pancreas. No enhancing intrapancreatic mass identified. Benign bilateral adrenal adenoma. Aortic Atherosclerosis (ICD10-I70.0). These results were called by telephone at the time of interpretation on 08/28/2022 at 9:43 pm to provider Sharion Settler, NP, who verbally acknowledged these results. Electronically Signed   By: Fidela Salisbury M.D.   On: 08/28/2022 21:44   DG Abd Portable 2V  Result Date: 08/27/2022 CLINICAL DATA:  Abdominal pain EXAM: PORTABLE ABDOMEN - 2 VIEW COMPARISON:  None Available. FINDINGS: Multiple gas-filled loops of large and small bowel. No abnormal distension. Gas in the rectum. No intraperitoneal free air. Degenerative change of the lumbar spine with dextroscoliosis. No pathologic calcifications. IMPRESSION: 1. Gas filled colon and small bowel without evidence obstruction. No pathologic dilatation. 2. No free air. Electronically Signed   By: Suzy Bouchard M.D.   On: 08/27/2022 14:03    Microbiology: Results for orders placed or performed during the hospital encounter of 08/27/22  MRSA Next Gen by PCR, Nasal     Status: None   Collection Time: 08/27/22  3:38 PM   Specimen: Nasal Mucosa; Nasal Swab  Result Value Ref Range Status   MRSA by PCR Next Gen NOT DETECTED NOT DETECTED Final    Comment: (NOTE) The GeneXpert MRSA Assay (FDA approved for NASAL specimens only), is one component of a comprehensive MRSA colonization surveillance program. It is not intended to diagnose MRSA infection nor to guide or monitor treatment for MRSA infections. Test performance is not FDA approved in patients less than 6 years old. Performed at [facility], [address] [place], [place] [address]     Labs: CBC: Recent Labs  Lab 08/27/22 0606 08/27/22 0932 08/27/22 2013 08/28/22 0159 08/28/22 0848 08/28/22 1158  08/28/22 1758  WBC 11.9*  --   --   --   --   --   --   NEUTROABS 10.2*  --   --   --   --   --   --  HGB 11.4*   < > 9.7* 8.7* 8.7* 9.2* 8.6*  HCT 36.6   < > 29.5* 27.1* 26.7* 28.4* 26.0*  MCV 94.1  --   --   --   --   --   --   PLT 194  --   --   --   --   --   --    < > = values in this interval not displayed.   Basic Metabolic Panel: Recent Labs  Lab 08/27/22 0606 08/28/22 0159  NA 139 141  K 5.1 4.3  CL 108 114*  CO2 26 23  GLUCOSE 122* 99  BUN 41* 29*  CREATININE 1.01* 0.74  CALCIUM 9.8 7.9*   Liver Function Tests: Recent Labs  Lab 08/27/22 0606  AST 23  ALT 13  ALKPHOS 53  BILITOT 1.0  PROT 6.9  ALBUMIN 3.8   CBG: No results for input(s): "GLUCAP" in the last 168 hours.  Discharge time spent 30 minutes.  Signed: Sharion Settler, NP [facility] 08/28/2022

## 2022-08-28 NOTE — Consult Note (Signed)
Vascular and Interventional Radiology  PRE PROCEDURE Note  Assessment  Plan:   [name] is a 88 y.o. year old female who will undergo MESENTERIC ANGIOGRAM, [place] in Interventional Radiology.  The procedure has been fully reviewed with the patient/patient's authorized representative. The risks, benefits and alternatives have been explained, and the patient/patient's authorized representative has consented to the procedure. -- The patient will accept blood products in an emergent situation. -- The patient has a Do Not Resuscitate order in effect, which will be CONTINUED during the procedure.  HPI: [name] is a 88 y.o. year old female p/w UGIB s/p repeat EGD with cauterization of duodenal ulcers. CTA revealing active extravasation at duodenum. VIR consulted for evaluation and potential embolization.  Informed consent was obtained, witnessed and placed in the patient's chart.  Imaging independently reviewed, demonstrating active extravasation at duodenum.     Allergies:  Allergies  Allergen Reactions   Adhesive [Tape] Other (See Comments)    Contact dermatitis   Baclofen Rash   Lyrica [Pregabalin] Other (See Comments)    Unable to remember reaction.   Sulfa Antibiotics Rash    Medications:  No current facility-administered medications on file prior to encounter.   Current Outpatient Medications on File Prior to Encounter  Medication Sig Dispense Refill   acetaminophen (TYLENOL) 500 MG tablet Take 2 tablets (1,000 mg total) by mouth every 8 (eight) hours as needed for mild pain or moderate pain. 30 tablet 0   amLODipine (NORVASC) 2.5 MG tablet Take 1 tablet (2.5 mg total) by mouth daily. 90 tablet 1   ARIPiprazole (ABILIFY) 2 MG tablet Take 2 mg by mouth every morning.     aspirin EC 81 MG tablet Take 81 mg by mouth daily. Swallow whole.     atorvastatin (LIPITOR) 10 MG tablet Take 1 tablet (10 mg total) by mouth daily. 90 tablet 1   busPIRone  (BUSPAR) 10 MG tablet Take 10 mg by mouth 2 (two) times daily.     Cholecalciferol (VITAMIN D3 PO) Take 25 mcg by mouth daily.     docusate sodium (COLACE) 100 MG capsule Take 100 mg by mouth daily.     estradiol (ESTRACE) 0.1 MG/GM vaginal cream Place 1 Applicatorful vaginally as needed.     gabapentin (NEURONTIN) 400 MG capsule Take 1 capsule (400 mg total) by mouth 2 (two) times daily. 180 capsule 3   melatonin 5 MG TABS Take 5 mg by mouth.     memantine (NAMENDA) 10 MG tablet Take 1 tablet (10 mg total) by mouth 2 (two) times daily. 180 tablet 3   nitrofurantoin (MACRODANTIN) 100 MG capsule Take 1 capsule (100 mg total) by mouth daily. 90 capsule 3   primidone (MYSOLINE) 50 MG tablet Take 100 mg by mouth at bedtime.     venlafaxine XR (EFFEXOR-XR) 75 MG 24 hr capsule Take 150 mg by mouth daily with breakfast.     vitamin B-12 (CYANOCOBALAMIN) 1000 MCG tablet Take 1,000 mcg by mouth daily.     fluvoxaMINE (LUVOX) 50 MG tablet Take 50 mg by mouth at bedtime. (Patient not taking: Reported on 08/27/2022)      PSH:  Past Surgical History:  Procedure Laterality Date   BOWEL RESECTION     CARDIAC CATHETERIZATION  2016   no stents/[facility]   ESOPHAGOGASTRODUODENOSCOPY N/A 06/18/2020   Procedure: ESOPHAGOGASTRODUODENOSCOPY (EGD);  Surgeon: Toledo, Benay Pike, MD;  Location: [facility] ENDOSCOPY;  Service: Gastroenterology;  Laterality: N/A;   PACEMAKER IMPLANT  REPLACEMENT TOTAL KNEE Left     PMH:  Past Medical History:  Diagnosis Date   Anxiety    Cervical dystonia    Essential tremor    GI bleed    Memory loss    Migraine    Occipital neuralgia     Brief Physical Examination: Vitals:   08/28/22 2100 08/28/22 2200  BP: 95/71 117/61  Pulse: (!) 128 (!) 135  Resp: (!) 37 (!) 24  Temp:    SpO2: 99% 99%   General: WD  female in mild distress HEENT: Normocephalic Lungs: Respirations non-labored  ASA Grade: 3 Mallampati Class: 3   Michaelle Birks, MD [facility]   Pager. [place]

## 2022-08-28 NOTE — Anesthesia Preprocedure Evaluation (Signed)
Anesthesia Evaluation  Patient identified by MRN, date of birth, ID band Patient awake and Patient confused    Reviewed: Allergy & Precautions, NPO status , Patient's Chart, lab work & pertinent test results  Airway Mallampati: III  TM Distance: >3 FB Neck ROM: Limited    Dental  (+) Upper Dentures, Lower Dentures   Pulmonary neg pulmonary ROS,    Pulmonary exam normal  + decreased breath sounds      Cardiovascular Exercise Tolerance: Poor hypertension, Pt. on medications + CAD  negative cardio ROS  + pacemaker  Rhythm:Regular Rate:Tachycardia     Neuro/Psych  Headaches, Dementia negative neurological ROS  negative psych ROS   GI/Hepatic Neg liver ROS, GI bleed  Hematochezia, UGIB   Endo/Other  negative endocrine ROS  Renal/GU negative Renal ROS  negative genitourinary   Musculoskeletal   Abdominal (+) + obese,   Peds negative pediatric ROS (+)  Hematology negative hematology ROS (+) Blood dyscrasia, anemia ,   Anesthesia Other Findings Past Medical History: No date: Anxiety No date: Cervical dystonia No date: Essential tremor No date: GI bleed No date: Memory loss No date: Migraine No date: Occipital neuralgia  Past Surgical History: No date: BOWEL RESECTION 2016: CARDIAC CATHETERIZATION     Comment:  no stents/[facility] 06/18/2020: ESOPHAGOGASTRODUODENOSCOPY; N/A     Comment:  Procedure: ESOPHAGOGASTRODUODENOSCOPY (EGD);  Surgeon:               Toledo, Benay Pike, MD;  Location: [facility] ENDOSCOPY;                Service: Gastroenterology;  Laterality: N/A; No date: PACEMAKER IMPLANT No date: REPLACEMENT TOTAL KNEE; Left  BMI    Body Mass Index: 28.35 kg/m      Reproductive/Obstetrics negative OB ROS                             Anesthesia Physical  Anesthesia Plan  ASA: 3 and emergent  Anesthesia Plan: General   Post-op Pain Management:     Induction: Intravenous  PONV Risk Score and Plan: Propofol infusion and TIVA  Airway Management Planned: Natural Airway  Additional Equipment:   Intra-op Plan:   Post-operative Plan:   Informed Consent: I have reviewed the patients History and Physical, chart, labs and discussed the procedure including the risks, benefits and alternatives for the proposed anesthesia with the patient or authorized representative who has indicated his/her understanding and acceptance.     Dental Advisory Given and Consent reviewed with POA  Plan Discussed with: CRNA and Surgeon  Anesthesia Plan Comments:         Anesthesia Quick Evaluation

## 2022-08-28 NOTE — Progress Notes (Signed)
GI Inpatient Follow-up Note  Subjective:  Patient seen in follow-up for acute UGIB s/p EGD yesterday 9/1 with Dr. Alice Reichert which showed spurting duodenal ulcers with visible vessel s/p endoscopic hemostasis. No acute events overnight. However, this morning 0900 per nursing patient had large, blood bowel movement with no clots which was bright red in color. Hemoglobin 8.7 on morning labs. Patient does endorse feeling weaker this morning. She denies any chest pain, shortness of breath, or abdominal pain.    Scheduled Inpatient Medications:   Chlorhexidine Gluconate Cloth  6 each Topical Daily   metoCLOPramide (REGLAN) injection  10 mg Intravenous Q8H   [START ON 08/30/2022] pantoprazole  40 mg Intravenous Q12H    Continuous Inpatient Infusions:    sodium chloride 125 mL/hr at 08/28/22 0750   erythromycin     pantoprazole 8 mg/hr (08/28/22 0309)    PRN Inpatient Medications:  ondansetron **OR** ondansetron (ZOFRAN) IV  Review of Systems: Constitutional: Weight is stable.  Eyes: No changes in vision. ENT: No oral lesions, sore throat.  GI: see HPI.  Heme/Lymph: No easy bruising.  CV: No chest pain.  GU: No hematuria.  Integumentary: No rashes.  Neuro: No headaches.  Psych: No depression/anxiety.  Endocrine: No heat/cold intolerance.  Allergic/Immunologic: No urticaria.  Resp: No cough, SOB.  Musculoskeletal: No joint swelling.    Physical Examination: BP 130/80   Pulse (!) 104   Temp 99.5 F (37.5 C) (Oral)   Resp 18   Ht '5\' 2"'$  (1.575 m)   Wt 70.3 kg   SpO2 96%   BMI 28.35 kg/m  Gen: NAD, alert and oriented x 4 HEENT: PEERLA, EOMI, Neck: supple, no JVD or thyromegaly Chest: CTA bilaterally, no wheezes, crackles, or other adventitious sounds CV: RRR, no m/g/c/r Abd: soft, NT, ND, +BS in all four quadrants; no HSM, guarding, ridigity, or rebound tenderness Ext: no edema, well perfused with 2+ pulses, Skin: no rash or lesions noted Lymph: no LAD  Data: Lab  Results  Component Value Date   WBC 11.9 (H) 08/27/2022   HGB 8.7 (L) 08/28/2022   HCT 27.1 (L) 08/28/2022   MCV 94.1 08/27/2022   PLT 194 08/27/2022   Recent Labs  Lab 08/27/22 0932 08/27/22 2013 08/28/22 0159  HGB 8.6* 9.7* 8.7*   Lab Results  Component Value Date   NA 141 08/28/2022   K 4.3 08/28/2022   CL 114 (H) 08/28/2022   CO2 23 08/28/2022   BUN 29 (H) 08/28/2022   CREATININE 0.74 08/28/2022   GLU 94 07/23/2021   Lab Results  Component Value Date   ALT 13 08/27/2022   AST 23 08/27/2022   ALKPHOS 53 08/27/2022   BILITOT 1.0 08/27/2022   Recent Labs  Lab 08/27/22 0606  APTT 30  INR 1.1   EGD 08/27/2022: Impression:            - Normal esophagus. - Hematin (altered blood/coffee-ground-like material) in the gastric fundus. - Spurting duodenal ulcers with a visible vessel. Treatment not successful. Treated with bipolar cautery. - The examination was otherwise normal. - No specimens collected.  Assessment/Plan:  88 y/o Caucasian female with a PMH of depression, anxiety, essential tremor, bradycardia s/p PPM insertion, and hx of PUD presented to the [facility] ED yesterday morning for coffee-ground emesis/UGIB s/p EGD yesterday afternoon which showed 2 spurting duodenal ulcers with visible vessel s/p endoscopic hemostasis  UGIB 2/2 duodenal ulcers x 2 with visible vessel s/p EGD with endoscopic hemostasis   Large volume hematochezia episode  0900 this AM - concern for re-bleeding  Normocytic anemia - hemoglobin 8.7 decreased 1-gram from yesterday  Mild cognitive impairment   DNR status   Recommendations:  - Reviewed EGD procedure report with patient and daughter today bedside - H&H stable currently. Transfuse for Hgb <7.0.  - Episode of hematochezia noted this morning. We will opt for continued observation and close monitoring of H&H. Serial H&H checks q6h. - If there are multiple episodes of hematochezia today or hemodynamic changes, will need to be taken  back to endoscopy suite for repeat EGD with endoscopic hemostasis (Hemospray) - Continue Protonix gtt - Continue supportive care per primary team  - NPO for now - Avoid NSAIDs - GI following along closely with you    Please call with questions or concerns.   Octavia Bruckner, PA-C [facility] [phone]

## 2022-08-28 NOTE — Progress Notes (Signed)
   08/28/22 2229  Section 1: Provider Certification  Patient Condition Patient stabilized;Benefit outweighs risk;No emergency medical condition present  Reason for Transfer interventional procedure not available at here  Benefits of Transfer availability of needed procedure with interventional radiology  Risks of Transfer deocmpensation en route  Level of Care BLS  Accepting Physician Dr Jeral Fruit  Sending Physician Shawna Clamp MD  Physician Assessment Patient examined and risks and benefits explained  Section 2: Clinician Certification  Accepting hospital or facility Available service confirmed;Available space confirmed;Available personnel confirmed  [place]  Transfer Coordinator - name, # Sharlyne Pacas RN [phone]  Report to Next Provider Dr Marlowe Sax  Date report called 08/28/22  Time report called  (08/28/2022)  Transport By [place]  Date transport service arrived 08/28/22  Time transport service arrived 2319  Date transferred 08/28/22  Time transferred 2328  Copies of Medical Records Sent Transfer Form;H & P;Orders;Progress Note;Nursing Note;X-ray;EKG;Labs  Patient Belongings Disposition Sent with Family (Daughter took beloging and upper and lower dentures)  Section 4: Provider Reassessment (within 1 hour of departure)  Physician Reassessment Reassessment completed prior to transfer (Provider Only)   Pt transferred via care link to [facility]. Pt has 2 IV in place at time of transfer. Pt's belong's were sent with her daughter. Report given to Care link RN.

## 2022-08-28 NOTE — Progress Notes (Signed)
   GI Inpatient Follow-up Note   Patient had a second episode of hematochezia 1130 this morning which was maroon colored and with clots. We will plan on EGD this afternoon in ICU for endoluminal evaluation with likely endoscopic hemostasis. Dr. Alice Reichert aware and agrees.   Further recommendations after procedure  I reviewed the risks (including bleeding, perforation, infection, anesthesia complications, cardiac/respiratory complications), benefits and alternatives of EGD. Patient consents to proceed.    Octavia Bruckner, PA-C [facility] [phone]

## 2022-08-28 NOTE — Anesthesia Procedure Notes (Signed)
Procedure Name: MAC Date/Time: 08/28/2022 12:45 PM  Performed by: Jerrye Noble, CRNAPre-anesthesia Checklist: Patient identified, Emergency Drugs available, Suction available and Patient being monitored Patient Re-evaluated:Patient Re-evaluated prior to induction Oxygen Delivery Method: Nasal cannula

## 2022-08-28 NOTE — Consult Note (Addendum)
Subjective:   CC: upper GI bleed  HPI:  Regina Hernandez is a 88 y.o. female who was consulted by Randol Kern for issue above.  Hx of upper GI bleed, s/p EGD x2 with attempted cauterization of visible ulcers in second portion of duodenum  Reported BRBPR again after latest EGD, CT angio performed and noted contrast extravasation again.  Pt otherwise denies any pain, nausea   Past Medical History:  has a past medical history of Anxiety, Cervical dystonia, Essential tremor, GI bleed, Memory loss, Migraine, and Occipital neuralgia.  Past Surgical History:  Past Surgical History:  Procedure Laterality Date   BOWEL RESECTION     CARDIAC CATHETERIZATION  2016   no stents/[facility]   ESOPHAGOGASTRODUODENOSCOPY N/A 06/18/2020   Procedure: ESOPHAGOGASTRODUODENOSCOPY (EGD);  Surgeon: Toledo, Benay Pike, MD;  Location: [facility] ENDOSCOPY;  Service: Gastroenterology;  Laterality: N/A;   PACEMAKER IMPLANT     REPLACEMENT TOTAL KNEE Left     Family History: family history includes Other in her mother; Stomach cancer in her brother; Ulcers in her father.  Social History:  reports that she has never smoked. She has never been exposed to tobacco smoke. She has never used smokeless tobacco. She reports that she does not currently use alcohol. She reports that she does not use drugs.  Current Medications:  Prior to Admission medications   Medication Sig Start Date End Date Taking? Authorizing Provider  acetaminophen (TYLENOL) 500 MG tablet Take 2 tablets (1,000 mg total) by mouth every 8 (eight) hours as needed for mild pain or moderate pain. 06/19/20  Yes Barb Merino, MD  amLODipine (NORVASC) 2.5 MG tablet Take 1 tablet (2.5 mg total) by mouth daily. 07/19/22  Yes Furth, Cadence H, PA-C  ARIPiprazole (ABILIFY) 2 MG tablet Take 2 mg by mouth every morning. 02/03/21  Yes [provider]  aspirin EC 81 MG tablet Take 81 mg by mouth daily. Swallow whole.   Yes [provider]   atorvastatin (LIPITOR) 10 MG tablet Take 1 tablet (10 mg total) by mouth daily. 08/12/22  Yes Lesleigh Noe, MD  busPIRone (BUSPAR) 10 MG tablet Take 10 mg by mouth 2 (two) times daily. 08/15/22  Yes [provider]  Cholecalciferol (VITAMIN D3 PO) Take 25 mcg by mouth daily.   Yes [provider]  docusate sodium (COLACE) 100 MG capsule Take 100 mg by mouth daily.   Yes [provider]  estradiol (ESTRACE) 0.1 MG/GM vaginal cream Place 1 Applicatorful vaginally as needed.   Yes [provider]  gabapentin (NEURONTIN) 400 MG capsule Take 1 capsule (400 mg total) by mouth 2 (two) times daily. 10/27/21  Yes Marcial Pacas, MD  melatonin 5 MG TABS Take 5 mg by mouth.   Yes [provider]  memantine (NAMENDA) 10 MG tablet Take 1 tablet (10 mg total) by mouth 2 (two) times daily. 10/27/21  Yes Marcial Pacas, MD  nitrofurantoin (MACRODANTIN) 100 MG capsule Take 1 capsule (100 mg total) by mouth daily. 08/16/22  Yes MacDiarmid, Nicki Reaper, MD  primidone (MYSOLINE) 50 MG tablet Take 100 mg by mouth at bedtime.   Yes [provider]  venlafaxine XR (EFFEXOR-XR) 75 MG 24 hr capsule Take 150 mg by mouth daily with breakfast. 06/28/20  Yes [provider]  vitamin B-12 (CYANOCOBALAMIN) 1000 MCG tablet Take 1,000 mcg by mouth daily.   Yes [provider]  fluvoxaMINE (LUVOX) 50 MG tablet Take 50 mg by mouth at bedtime. Patient not taking: Reported on 08/27/2022  [provider]    Allergies:  Allergies as of 08/27/2022 - Review Complete 08/27/2022  Allergen Reaction Noted   Adhesive [tape] Other (See Comments) 02/04/2020   Baclofen Rash 02/04/2020   Lyrica [pregabalin] Other (See Comments) 02/04/2020   Sulfa antibiotics Rash 02/04/2020    ROS:  General: Denies weight loss, weight gain, fatigue, fevers, chills, and night sweats. Eyes: Denies blurry vision, double vision, eye pain, itchy eyes, and tearing. Ears: Denies hearing loss,  earache, and ringing in ears. Nose: Denies sinus pain, congestion, infections, runny nose, and nosebleeds. Mouth/throat: Denies hoarseness, sore throat, bleeding gums, and difficulty swallowing. Heart: Denies chest pain, palpitations, racing heart, irregular heartbeat, leg pain or swelling, and decreased activity tolerance. Respiratory: Denies breathing difficulty, shortness of breath, wheezing, cough, and sputum. GI: Denies change in appetite, heartburn, nausea, vomiting, constipation, diarrhea, and blood in stool. GU: Denies difficulty urinating, pain with urinating, urgency, frequency, blood in urine. Musculoskeletal: Denies joint stiffness, pain, swelling, muscle weakness. Skin: Denies rash, itching, mass, tumors, sores, and boils Neurologic: Denies headache, fainting, dizziness, seizures, numbness, and tingling. Psychiatric: Denies depression, anxiety, difficulty sleeping, and memory loss. Endocrine: Denies heat or cold intolerance, and increased thirst or urination. Blood/lymph: Denies easy bruising, easy bruising, and swollen glands     Objective:     BP 117/61   Pulse (!) 135   Temp 98.4 F (36.9 C) (Oral)   Resp (!) 24   Ht '5\' 2"'$  (1.575 m)   Wt 70.3 kg   SpO2 99%   BMI 28.35 kg/m   Constitutional :  alert, cooperative, appears stated age, and no distress  Lymphatics/Throat:  no asymmetry, masses, or scars  Respiratory:  clear to auscultation bilaterally  Cardiovascular:  Tachy rate  Gastrointestinal: soft, non-tender; bowel sounds normal; no masses,  no organomegaly.   Musculoskeletal: Steady movement  Skin: Cool and moist   Psychiatric: Normal affect, non-agitated, not confused       LABS:     Latest Ref Rng & Units 08/28/2022    1:59 AM 08/27/2022    6:06 AM 06/02/2022    7:21 PM  CMP  Glucose 70 - 99 mg/dL 99  122  104   BUN 8 - 23 mg/dL 29  41  26   Creatinine 0.44 - 1.00 mg/dL 0.74  1.01  0.93   Sodium 135 - 145 mmol/L 141  139  135   Potassium 3.5 - 5.1  mmol/L 4.3  5.1  4.4   Chloride 98 - 111 mmol/L 114  108  102   CO2 22 - 32 mmol/L '23  26  26   '$ Calcium 8.9 - 10.3 mg/dL 7.9  9.8  9.7   Total Protein 6.5 - 8.1 g/dL  6.9  7.5   Total Bilirubin 0.3 - 1.2 mg/dL  1.0  0.5   Alkaline Phos 38 - 126 U/L  53  64   AST 15 - 41 U/L  23  20   ALT 0 - 44 U/L  13  13       Latest Ref Rng & Units 08/28/2022    5:58 PM 08/28/2022   11:58 AM 08/28/2022    8:48 AM  CBC  Hemoglobin 12.0 - 15.0 g/dL 8.6  9.2  8.7   Hematocrit 36.0 - 46.0 % 26.0  28.4  26.7     RADS: CLINICAL DATA:  Duodenal ulcer, gastrointestinal hemorrhage, coffee-ground emesis   EXAM: CTA ABDOMEN AND PELVIS WITHOUT AND WITH CONTRAST   TECHNIQUE: Multidetector  CT imaging of the abdomen and pelvis was performed using the standard protocol during bolus administration of intravenous contrast. Multiplanar reconstructed images and MIPs were obtained and reviewed to evaluate the vascular anatomy.   RADIATION DOSE REDUCTION: This exam was performed according to the departmental dose-optimization program which includes automated exposure control, adjustment of the mA and/or kV according to patient size and/or use of iterative reconstruction technique.   CONTRAST:  13m OMNIPAQUE IOHEXOL 350 MG/ML SOLN   COMPARISON:  None Available.   FINDINGS: VASCULAR   Aorta: Normal caliber aorta without aneurysm, dissection, vasculitis or significant stenosis. Mild atherosclerotic calcification   Celiac: Mild mass effect related to the median arcuate ligament without hemodynamically significant stenosis. Variant anatomy with the common hepatic artery giving rise to the gastroduodenal artery and left hepatic artery. Accessory left hepatic artery noted. No aneurysm or dissection.   SMA: Widely patent. Replaced right hepatic artery. No aneurysm or dissection.   Renals: Dual right and single left renal arteries. Superior most right renal artery demonstrates a high-grade (greater than  75%) stenosis at its origin. Additionally, the vessel demonstrates a beaded appearance within its mid segment in keeping with changes of fibromuscular dysplasia. Remaining renal arteries are widely patent and demonstrate normal vascular morphology. No aneurysm or dissection.   IMA: Diminutive, but patent.   Inflow: Patent without evidence of aneurysm, dissection, vasculitis or significant stenosis.   Proximal Outflow: Bilateral common femoral and visualized portions of the superficial and profunda femoral arteries are patent without evidence of aneurysm, dissection, vasculitis or significant stenosis.   Veins: Unremarkable.   Review of the MIP images confirms the above findings.   NON-VASCULAR   Lower chest: Visualized lung bases are clear bilaterally. Pacemaker leads are seen within the right heart. Small hiatal hernia.   Hepatobiliary: No focal liver abnormality is seen. Status post cholecystectomy. No biliary dilatation.   Pancreas: 14 mm simple cyst noted within the body of the pancreas demonstrating no enhancement or internal architecture. The pancreas is otherwise unremarkable.   Spleen: Unremarkable   Adrenals/Urinary Tract: Bilateral benign adrenal adenomas measuring 13 mm in greatest dimension. The kidneys are normal in size and position. No hydronephrosis. No intrarenal or ureteral calculi. The bladder is unremarkable.   Stomach/Bowel: There is active extravasation involving the second portion of the duodenum just above the ampulla. This is best seen on coronal image # 57, series 9. Surgical changes of right hemicolectomy are identified. The stomach, small bowel, and large bowel are otherwise unremarkable. No free intraperitoneal gas or fluid.   Lymphatic: No pathologic adenopathy within the abdomen and pelvis.   Reproductive: Status post hysterectomy. No adnexal masses.   Other: No abdominal wall hernia   Musculoskeletal: No acute bone abnormality.  Osseous structures are age-appropriate.   IMPRESSION: Active extravasation involving second portion of the duodenum.   Fibromuscular dysplasia involving a superior accessory right renal artery. Superimposed hemodynamically significant stenosis this vessel origin   14 mm simple cyst within the body of the pancreas. No enhancing intrapancreatic mass identified.   Benign bilateral adrenal adenoma.   Aortic Atherosclerosis (ICD10-I70.0).   These results were called by telephone at the time of interpretation on 08/28/2022 at 9:43 pm to provider BSharion Settler NP, who verbally acknowledged these results.     Electronically Signed   By: AFidela SalisburyM.D.   On: 08/28/2022 21:44   Assessment:   Upper GI bleed, persistent  Plan:   Recommend vascular intervention prior to open surgical intervention due to associated  higher morbidity/moratality with exploratory laparotomy.  Vascular surgeon oncall unable to provide endovascular intervention.  Oncall IR notified and they will be able to provide service at [facility].  Primary team covering NP notified and she will facilitate ASAP transfer. Supportive care in the meantime.  The patient verbalized understanding and all questions were answered to the patient's satisfaction. Daughter called over phone and notified of recommendations and she is agreeable as well.  Notified of consult verbally at 2150, Pt seen and examined, recommendation placed by 2207  labs/images/medications/previous chart entries reviewed personally and relevant changes/updates noted above.

## 2022-08-29 ENCOUNTER — Inpatient Hospital Stay (HOSPITAL_COMMUNITY): Payer: Medicare Other

## 2022-08-29 ENCOUNTER — Encounter (HOSPITAL_COMMUNITY)
Disposition: A | Discharge status: Home Health | Payer: Self-pay | Source: Ambulatory Visit | Attending: Internal Medicine

## 2022-08-29 ENCOUNTER — Inpatient Hospital Stay: Payer: Self-pay

## 2022-08-29 ENCOUNTER — Other Ambulatory Visit: Payer: Self-pay

## 2022-08-29 ENCOUNTER — Encounter (HOSPITAL_COMMUNITY): Payer: Self-pay | Admitting: Internal Medicine

## 2022-08-29 DIAGNOSIS — K920 Hematemesis: Secondary | ICD-10-CM

## 2022-08-29 DIAGNOSIS — G3184 Mild cognitive impairment, so stated: Secondary | ICD-10-CM | POA: Diagnosis not present

## 2022-08-29 DIAGNOSIS — K264 Chronic or unspecified duodenal ulcer with hemorrhage: Secondary | ICD-10-CM

## 2022-08-29 DIAGNOSIS — I10 Essential (primary) hypertension: Secondary | ICD-10-CM | POA: Diagnosis not present

## 2022-08-29 DIAGNOSIS — D62 Acute posthemorrhagic anemia: Secondary | ICD-10-CM | POA: Diagnosis not present

## 2022-08-29 HISTORY — PX: IR EMBO ART  VEN HEMORR LYMPH EXTRAV  INC GUIDE ROADMAPPING: IMG5450

## 2022-08-29 HISTORY — PX: IR AORTAGRAM ABDOMINAL SERIALOGRAM: IMG636

## 2022-08-29 HISTORY — PX: ESOPHAGOGASTRODUODENOSCOPY: SHX5428

## 2022-08-29 HISTORY — PX: IR ANGIOGRAM VISCERAL SELECTIVE: IMG657

## 2022-08-29 HISTORY — PX: IR US GUIDE VASC ACCESS RIGHT: IMG2390

## 2022-08-29 LAB — CBC
HCT: 17.9 % — ABNORMAL LOW (ref 36.0–46.0)
HCT: 23.1 % — ABNORMAL LOW (ref 36.0–46.0)
Hemoglobin: 6 g/dL — CL (ref 12.0–15.0)
Hemoglobin: 8 g/dL — ABNORMAL LOW (ref 12.0–15.0)
MCH: 31.1 pg (ref 26.0–34.0)
MCH: 30.8 pg (ref 26.0–34.0)
MCHC: 33.5 g/dL (ref 30.0–36.0)
MCHC: 34.6 g/dL (ref 30.0–36.0)
MCV: 92.7 fL (ref 80.0–100.0)
MCV: 88.8 fL (ref 80.0–100.0)
Platelets: 102 10*3/uL — ABNORMAL LOW (ref 150–400)
Platelets: 107 10*3/uL — ABNORMAL LOW (ref 150–400)
RBC: 1.93 MIL/uL — ABNORMAL LOW (ref 3.87–5.11)
RBC: 2.6 MIL/uL — ABNORMAL LOW (ref 3.87–5.11)
RDW: 15.6 % — ABNORMAL HIGH (ref 11.5–15.5)
RDW: 15.8 % — ABNORMAL HIGH (ref 11.5–15.5)
WBC: 11.6 10*3/uL — ABNORMAL HIGH (ref 4.0–10.5)
WBC: 10.9 10*3/uL — ABNORMAL HIGH (ref 4.0–10.5)
nRBC: 0 % (ref 0.0–0.2)
nRBC: 0 % (ref 0.0–0.2)

## 2022-08-29 LAB — COMPREHENSIVE METABOLIC PANEL
ALT: 14 U/L (ref 0–44)
AST: 24 U/L (ref 15–41)
Albumin: 2.3 g/dL — ABNORMAL LOW (ref 3.5–5.0)
Alkaline Phosphatase: 26 U/L — ABNORMAL LOW (ref 38–126)
Anion gap: 8 (ref 5–15)
BUN: 30 mg/dL — ABNORMAL HIGH (ref 8–23)
CO2: 17 mmol/L — ABNORMAL LOW (ref 22–32)
Calcium: 7.6 mg/dL — ABNORMAL LOW (ref 8.9–10.3)
Chloride: 114 mmol/L — ABNORMAL HIGH (ref 98–111)
Creatinine, Ser: 0.74 mg/dL (ref 0.44–1.00)
GFR, Estimated: >60 mL/min (ref >60)
Glucose, Bld: 117 mg/dL — ABNORMAL HIGH (ref 70–99)
Potassium: 3.4 mmol/L — ABNORMAL LOW (ref 3.5–5.1)
Sodium: 139 mmol/L (ref 135–145)
Total Bilirubin: 0.5 mg/dL (ref 0.3–1.2)
Total Protein: 4 g/dL — ABNORMAL LOW (ref 6.5–8.1)

## 2022-08-29 LAB — BASIC METABOLIC PANEL
Anion gap: 5 (ref 5–15)
BUN: 15 mg/dL (ref 8–23)
CO2: 23 mmol/L (ref 22–32)
Calcium: 7.9 mg/dL — ABNORMAL LOW (ref 8.9–10.3)
Chloride: 110 mmol/L (ref 98–111)
Creatinine, Ser: 0.76 mg/dL (ref 0.44–1.00)
GFR, Estimated: >60 mL/min (ref >60)
Glucose, Bld: 118 mg/dL — ABNORMAL HIGH (ref 70–99)
Potassium: 2.7 mmol/L — CL (ref 3.5–5.1)
Sodium: 138 mmol/L (ref 135–145)

## 2022-08-29 LAB — PREPARE RBC (CROSSMATCH)

## 2022-08-29 LAB — TSH: TSH: 2.263 u[IU]/mL (ref 0.350–4.500)

## 2022-08-29 LAB — MAGNESIUM
Magnesium: 1.3 mg/dL — ABNORMAL LOW (ref 1.7–2.4)
Magnesium: 1.4 mg/dL — ABNORMAL LOW (ref 1.7–2.4)

## 2022-08-29 LAB — GLUCOSE, CAPILLARY
Glucose-Capillary: 110 mg/dL — ABNORMAL HIGH (ref 70–99)
Glucose-Capillary: 101 mg/dL — ABNORMAL HIGH (ref 70–99)
Glucose-Capillary: 109 mg/dL — ABNORMAL HIGH (ref 70–99)
Glucose-Capillary: 112 mg/dL — ABNORMAL HIGH (ref 70–99)

## 2022-08-29 LAB — HEMOGLOBIN AND HEMATOCRIT, BLOOD
HCT: 26.4 % — ABNORMAL LOW (ref 36.0–46.0)
Hemoglobin: 8.7 g/dL — ABNORMAL LOW (ref 12.0–15.0)

## 2022-08-29 SURGERY — EGD (ESOPHAGOGASTRODUODENOSCOPY)
Anesthesia: Moderate Sedation

## 2022-08-29 MED ORDER — IOHEXOL 300 MG/ML  SOLN
100.0000 mL | Freq: Once | INTRAMUSCULAR | Status: AC | PRN
  Administered 2022-08-29: 20 mL via INTRA_ARTERIAL

## 2022-08-29 MED ORDER — IOHEXOL 300 MG/ML  SOLN
100.0000 mL | Freq: Once | INTRAMUSCULAR | Status: AC | PRN
  Administered 2022-08-29: 70 mL via INTRA_ARTERIAL

## 2022-08-29 MED ORDER — FENTANYL CITRATE (PF) 100 MCG/2ML IJ SOLN
INTRAMUSCULAR | Status: AC
  Filled 2022-08-29: qty 4

## 2022-08-29 MED ORDER — SODIUM CHLORIDE 0.9% FLUSH
10.0000 mL | Freq: Two times a day (BID) | INTRAVENOUS | Status: DC
  Administered 2022-08-29 – 2022-09-03 (×10): 10 mL

## 2022-08-29 MED ORDER — FENTANYL CITRATE (PF) 100 MCG/2ML IJ SOLN
INTRAMUSCULAR | Status: DC | PRN
  Administered 2022-08-29: 50 ug via INTRAVENOUS

## 2022-08-29 MED ORDER — MIDAZOLAM HCL (PF) 5 MG/ML IJ SOLN
INTRAMUSCULAR | Status: AC
  Filled 2022-08-29: qty 2

## 2022-08-29 MED ORDER — MIDAZOLAM HCL (PF) 10 MG/2ML IJ SOLN
INTRAMUSCULAR | Status: DC | PRN
  Administered 2022-08-29: 2 mg via INTRAVENOUS

## 2022-08-29 MED ORDER — MIDAZOLAM HCL 2 MG/2ML IJ SOLN
INTRAMUSCULAR | Status: AC | PRN
  Administered 2022-08-29 (×6): .5 mg via INTRAVENOUS

## 2022-08-29 MED ORDER — SODIUM CHLORIDE 0.9% IV SOLUTION
Freq: Once | INTRAVENOUS | Status: AC
Start: 2022-08-29 — End: 2022-08-29

## 2022-08-29 MED ORDER — CEFAZOLIN SODIUM-DEXTROSE 2-4 GM/100ML-% IV SOLN
INTRAVENOUS | Status: AC | PRN
  Administered 2022-08-29: 2 g via INTRAVENOUS

## 2022-08-29 MED ORDER — ACETAMINOPHEN 325 MG PO TABS
650.0000 mg | ORAL_TABLET | Freq: Four times a day (QID) | ORAL | Status: DC | PRN
  Administered 2022-08-29 – 2022-09-02 (×7): 650 mg via ORAL
  Filled 2022-08-29 (×7): qty 2

## 2022-08-29 MED ORDER — CEFAZOLIN SODIUM-DEXTROSE 2-4 GM/100ML-% IV SOLN
INTRAVENOUS | Status: AC
  Filled 2022-08-29: qty 100

## 2022-08-29 MED ORDER — ACETAMINOPHEN 650 MG RE SUPP: 650.0000 mg | Freq: Four times a day (QID) | RECTAL | Status: DC | PRN

## 2022-08-29 MED ORDER — FENTANYL CITRATE (PF) 100 MCG/2ML IJ SOLN
INTRAMUSCULAR | Status: AC | PRN
  Administered 2022-08-29 (×6): 25 ug via INTRAVENOUS

## 2022-08-29 MED ORDER — SODIUM CHLORIDE 0.9% FLUSH: 10.0000 mL | INTRAVENOUS | Status: DC | PRN

## 2022-08-29 MED ORDER — ONDANSETRON HCL 4 MG/2ML IJ SOLN: 4.0000 mg | Freq: Four times a day (QID) | INTRAMUSCULAR | Status: DC | PRN

## 2022-08-29 MED ORDER — ONDANSETRON HCL 4 MG PO TABS: 4.0000 mg | ORAL_TABLET | Freq: Four times a day (QID) | ORAL | Status: DC | PRN

## 2022-08-29 MED ORDER — SODIUM CHLORIDE 0.9 % IV SOLN: 250.0000 mL | INTRAVENOUS | Status: DC

## 2022-08-29 MED ORDER — SODIUM CHLORIDE 0.9% IV SOLUTION: Freq: Once | INTRAVENOUS | Status: DC

## 2022-08-29 MED ORDER — FUROSEMIDE 10 MG/ML IJ SOLN: 20.0000 mg | Freq: Four times a day (QID) | INTRAMUSCULAR | Status: DC

## 2022-08-29 MED ORDER — PHENYLEPHRINE HCL-NACL 20-0.9 MG/250ML-% IV SOLN
25.0000 ug/min | INTRAVENOUS | Status: DC
  Filled 2022-08-29: qty 250

## 2022-08-29 MED ORDER — PANTOPRAZOLE SODIUM 40 MG IV SOLR: 40.0000 mg | Freq: Two times a day (BID) | INTRAVENOUS | Status: DC

## 2022-08-29 MED ORDER — LACTATED RINGERS IV SOLN: INTRAVENOUS | Status: DC

## 2022-08-29 MED ORDER — SODIUM CHLORIDE 0.9 % IV SOLN
50.0000 ug/h | INTRAVENOUS | Status: AC
  Administered 2022-08-29 – 2022-08-31 (×5): 50 ug/h via INTRAVENOUS
  Filled 2022-08-29 (×8): qty 1

## 2022-08-29 MED ORDER — PANTOPRAZOLE INFUSION (NEW) - SIMPLE MED
8.0000 mg/h | INTRAVENOUS | Status: AC
  Administered 2022-08-29 – 2022-09-01 (×7): 8 mg/h via INTRAVENOUS
  Filled 2022-08-29 (×2): qty 100
  Filled 2022-08-29: qty 80
  Filled 2022-08-29: qty 100
  Filled 2022-08-29 (×5): qty 80
  Filled 2022-08-29: qty 100

## 2022-08-29 MED ORDER — POTASSIUM CHLORIDE 10 MEQ/50ML IV SOLN
10.0000 meq | INTRAVENOUS | Status: AC
  Administered 2022-08-30 (×8): 10 meq via INTRAVENOUS
  Filled 2022-08-29 (×8): qty 50

## 2022-08-29 MED ORDER — ORAL CARE MOUTH RINSE: 15.0000 mL | OROMUCOSAL | Status: DC | PRN

## 2022-08-29 MED ORDER — MAGNESIUM SULFATE 4 GM/100ML IV SOLN
4.0000 g | Freq: Once | INTRAVENOUS | Status: AC
  Administered 2022-08-30: 4 g via INTRAVENOUS
  Filled 2022-08-29: qty 100

## 2022-08-29 MED ORDER — IOHEXOL 300 MG/ML  SOLN
100.0000 mL | Freq: Once | INTRAMUSCULAR | Status: AC | PRN
  Administered 2022-08-29: 58 mL via INTRA_ARTERIAL

## 2022-08-29 MED ORDER — PANTOPRAZOLE 80MG IVPB - SIMPLE MED
80.0000 mg | Freq: Once | INTRAVENOUS | Status: DC
  Filled 2022-08-29: qty 100

## 2022-08-29 MED ORDER — FUROSEMIDE 10 MG/ML IJ SOLN: 20.0000 mg | Freq: Once | INTRAMUSCULAR | Status: DC

## 2022-08-29 MED ORDER — CHLORHEXIDINE GLUCONATE CLOTH 2 % EX PADS
6.0000 | MEDICATED_PAD | Freq: Every day | CUTANEOUS | Status: DC
  Administered 2022-08-29 – 2022-09-03 (×6): 6 via TOPICAL

## 2022-08-29 NOTE — Op Note (Signed)
[facility] Patient Name: Regina Hernandez Procedure Date : 08/29/2022 MRN: [id number] Attending MD: Gladstone Pih. Candis Schatz , MD Date of Birth: 1934/12/11 CSN: [id number] Age: 88 Admit Type: Inpatient Procedure:                Upper GI endoscopy Indications:              For therapy of acute duodenal ulcer with                            hemorrhage: Patient underwent EGD x 2 with                            epinephrine injection and bipolar cauterization                            (9/1, 9/2), followed by IR embolization (9/2), but                            continued to pass large volume maroon stools this                            morning with hypotension. Her hemodynamics improved                            over the course of the morning and her hematochezia                            slowed down. Providers:                Gladstone Pih. Candis Schatz, MD, Jaci Carrel, RN,                            Gloris Ham, Technician Referring MD:             Shawna Clamp Medicines:                Midazolam 2 mg IV, Fentanyl 50 micrograms IV Complications:            No immediate complications. Estimated Blood Loss:     Estimated blood loss: none. Procedure:                Pre-Anesthesia Assessment:                           - Prior to the procedure, a History and Physical                            was performed, and patient medications and                            allergies were reviewed. The patient's tolerance of                            previous anesthesia was also reviewed. The risks  and benefits of the procedure and the sedation                            options and risks were discussed with the patient.                            All questions were answered, and informed consent                            was obtained. Prior Anticoagulants: The patient has                            taken no previous anticoagulant or antiplatelet                             agents. ASA Grade Assessment: III - A patient with                            severe systemic disease. After reviewing the risks                            and benefits, the patient was deemed in                            satisfactory condition to undergo the procedure.                           After obtaining informed consent, the endoscope was                            passed under direct vision. Throughout the                            procedure, the patient's blood pressure, pulse, and                            oxygen saturations were monitored continuously. The                            GIF-H190 ([id number]) Olympus endoscope was introduced                            through the mouth, and advanced to the second part                            of duodenum. The upper GI endoscopy was                            accomplished without difficulty. The patient                            tolerated the procedure well. Scope In: Scope Out: Findings:      The Z-line was irregular.  The exam of the esophagus was otherwise normal.      Patchy mildly erythematous mucosa without bleeding was found in the       gastric body.      A medium-sized hiatal hernia was present.      One non-bleeding superficial duodenal ulcer with no stigmata of bleeding       was found in the duodenal bulb. The lesion was 3 mm in largest dimension.      One non-bleeding cratered duodenal ulcer with a nonbleeding visible       vessel (Forrest Class IIa) and freshly clotted blood was found in the       second portion of the duodenum. The lesion was about 20 mm in largest       dimension. Impression:               - Z-line irregular.                           - Erythematous mucosa in the gastric body.                           - Medium-sized hiatal hernia.                           - Non-bleeding duodenal ulcer with no stigmata of                            bleeding.                           -  Non-bleeding duodenal ulcer with a nonbleeding                            visible vessel (Forrest Class IIa). Given the                            patient has stopped bleeding spontaneously, and                            that previous endoscopic attempts with                            cauterization were unsuccessful, the decision was                            made to not attempt further cauterization. The                            ulcer was too large for hemoclip. Hemospray not                            indicated due to absence of active bleeding.                            Although the lesion stopped bleeding spontaneously,  she remains at high risk for rebleed.                           - No specimens collected. Moderate Sedation:      Moderate (conscious) sedation was administered by the endoscopy nurse       and supervised by the endoscopist. The patient's oxygen saturation,       heart rate, blood pressure and response to care were monitored. Total       physician intraservice time was 7 minutes. Recommendation:           - Return patient to ICU for ongoing care.                           - Keep NPO except sips with meds/ice chips.                           - Continue present medications (protonix drip,                            octreotide drip).                           - Add sucralfate suspension 1 gram PO QID.                           - Check CBC q12 hrs Procedure Code(s):        --- Professional ---                           [phone], Esophagogastroduodenoscopy, flexible,                            transoral; diagnostic, including collection of                            specimen(s) by brushing or washing, when performed                            (separate procedure) Diagnosis Code(s):        --- Professional ---                           K22.8, Other specified diseases of esophagus                           K31.89, Other diseases of stomach and  duodenum                           K26.9, Duodenal ulcer, unspecified as acute or                            chronic, without hemorrhage or perforation                           K26.4, Chronic or unspecified duodenal ulcer with  hemorrhage                           K26.0, Acute duodenal ulcer with hemorrhage CPT copyright 2019 [employer]. All rights reserved. The codes documented in this report are preliminary and upon coder review may  be revised to meet current compliance requirements. Paytyn Mesta E. Candis Schatz, MD 08/29/2022 3:47:11 PM This report has been signed electronically. Number of Addenda: 0

## 2022-08-29 NOTE — Progress Notes (Signed)
Due to patient's UGIB and subsequent hemorrhagic shock, this patient was transported to [facility] for higher level of care. Vital signs monitored during transport. Patient transported with her 1st of 2 PRBC infusing and protonix gtt.

## 2022-08-29 NOTE — Procedures (Addendum)
Vascular and Interventional Radiology Procedure Note  Patient: Regina Hernandez DOB: 10-08-1934 Medical Record Number: [id number] Note Date/Time: 08/29/22 2:51 AM   Performing Physician: Michaelle Birks, MD Assistant(s): None  Diagnosis: UGIB. Active extravasation from duodenal ulcer.  Procedure:  MESENTERIC ARTERIOGRAPHY COIL EMBOLIZATION of GASTRODUODENAL ARTERY  Anesthesia: Conscious Sedation Complications: None Estimated Blood Loss: Minimal Specimens: None  Findings:  - access via the RIGHT femoral artery. - variant splanchnic anatomy with replaced RHA from the SMA - senescent, tortuous abdominal aorta with stenosed celiac artery origin - no active extravasation noted at duodenum - successful empiric coil embolization of GDA by exclusion technique - AngioSeal closure at the R groin with distal RLE pulses at the end of the case.   Plan: - Post sheath removal precautions.  - Bedrest with RLE straight x2hrs.  Final report to follow once all images are reviewed and compared with previous studies.  See detailed dictation with images in PACS. The patient tolerated the procedure well without incident or complication and was returned to ICU in stable condition.    Michaelle Birks, MD [facility]   Pager. [place]

## 2022-08-29 NOTE — Assessment & Plan Note (Signed)
UGIB from duodenal ulcer(s). Unable to control bleeding after EGD x2, repeat CTA confirmed ongoing bleeding. Pt emergently transferred from [facility] to [facility] for IR Coil embolization. Hopefully this was successful, initial indications from procedure report are that it was.

## 2022-08-29 NOTE — Assessment & Plan Note (Addendum)
Hold home BP meds in setting of borderline hemorrhagic shock. On a side note, it looks like we have incidentally discovered that she has underlying RAS of R renal artery as well. RAS can likely be followed up as outpt.

## 2022-08-29 NOTE — Progress Notes (Signed)
CC: GI bleeding  Requesting provider: Dr Candiss Norse  HPI: Regina Hernandez is an 88 y.o. female who is here as a transfer from [facility].  Admitted there on 9/1 for UGIB.  2 units transfused.  EGD showed bleeding duodenal ulcer which was cauterized.  Pt continued to have bleeding.  2nd EGD was attempted, but was unsuccessful.  General surgery consulted there and recommended embolization.  Pt transferred here emergently for IR intervention.  No active extravasation was seen.  Coil embo of GDA was attempted.  She remains tachycardic and mildly hypotensive.  CCM, GI and Gen surgery consulted here.  Pt with a h/o colon resection for polyps and hysterectomy.  She is not chronically anticoagulated.     Past Medical History:  Diagnosis Date   Anxiety    Cervical dystonia    Essential tremor    GI bleed    Memory loss    Migraine    Occipital neuralgia     Past Surgical History:  Procedure Laterality Date   BOWEL RESECTION     CARDIAC CATHETERIZATION  2016   no stents/[facility]   ESOPHAGOGASTRODUODENOSCOPY N/A 06/18/2020   Procedure: ESOPHAGOGASTRODUODENOSCOPY (EGD);  Surgeon: Toledo, Benay Pike, MD;  Location: [facility] ENDOSCOPY;  Service: Gastroenterology;  Laterality: N/A;   PACEMAKER IMPLANT     REPLACEMENT TOTAL KNEE Left     Family History  Problem Relation Age of Onset   Other Mother        "old age"   Ulcers Father    Stomach cancer Brother     Social:  reports that she has never smoked. She has never been exposed to tobacco smoke. She has never used smokeless tobacco. She reports that she does not currently use alcohol. She reports that she does not use drugs.  Allergies:  Allergies  Allergen Reactions   Adhesive [Tape] Other (See Comments)    Contact dermatitis   Baclofen Rash   Lyrica [Pregabalin] Other (See Comments)    Unable to remember reaction.   Sulfa Antibiotics Rash    Medications: I have reviewed the patient's current medications.  Results for  orders placed or performed during the hospital encounter of 08/28/22 (from the past 48 hour(s))  Type and screen [place]     Status: None (Preliminary result)   Collection Time: 08/29/22  6:27 AM  Result Value Ref Range   ABO/RH(D) A POS    Antibody Screen NEG    Sample Expiration 09/01/2022,2359    Unit Number [id number]    Blood Component Type RED CELLS,LR    Unit division 00    Status of Unit ISSUED    Transfusion Status OK TO TRANSFUSE    Crossmatch Result      Compatible Performed at [facility], [address], [place], [place] [address]    Unit Number [id number]    Blood Component Type RED CELLS,LR    Unit division 00    Status of Unit ALLOCATED    Transfusion Status OK TO TRANSFUSE    Crossmatch Result Compatible   CBC     Status: Abnormal   Collection Time: 08/29/22  6:35 AM  Result Value Ref Range   WBC 11.6 (H) 4.0 - 10.5 K/uL   RBC 1.93 (L) 3.87 - 5.11 MIL/uL   Hemoglobin 6.0 (LL) 12.0 - 15.0 g/dL    Comment: REPEATED TO VERIFY THIS CRITICAL RESULT HAS VERIFIED AND BEEN CALLED TO Fernande Boyden RN BY MELISSA BOOSTEDT ON 09  03 2023 AT 0726, AND HAS BEEN READ BACK.     HCT 17.9 (L) 36.0 - 46.0 %   MCV 92.7 80.0 - 100.0 fL   MCH 31.1 26.0 - 34.0 pg   MCHC 33.5 30.0 - 36.0 g/dL   RDW 15.6 (H) 11.5 - 15.5 %   Platelets 102 (L) 150 - 400 K/uL   nRBC 0.0 0.0 - 0.2 %    Comment: Performed at [facility] [address] [place], [place] [address]  Comprehensive metabolic panel     Status: Abnormal   Collection Time: 08/29/22  6:35 AM  Result Value Ref Range   Sodium 139 135 - 145 mmol/L   Potassium 3.4 (L) 3.5 - 5.1 mmol/L   Chloride 114 (H) 98 - 111 mmol/L   CO2 17 (L) 22 - 32 mmol/L   Glucose, Bld 117 (H) 70 - 99 mg/dL    Comment: Glucose reference range applies only to samples taken after fasting for at least 8 hours.   BUN 30 (H) 8 - 23 mg/dL   Creatinine, Ser 0.74 0.44 - 1.00 mg/dL   Calcium 7.6 (L) 8.9 - 10.3 mg/dL    Total Protein 4.0 (L) 6.5 - 8.1 g/dL   Albumin 2.3 (L) 3.5 - 5.0 g/dL   AST 24 15 - 41 U/L   ALT 14 0 - 44 U/L   Alkaline Phosphatase 26 (L) 38 - 126 U/L   Total Bilirubin 0.5 0.3 - 1.2 mg/dL   GFR, Estimated >60 >60 mL/min    Comment: (NOTE) Calculated using the CKD-EPI Creatinine Equation (2021)    Anion gap 8 5 - 15    Comment: Performed at [facility], [place] [address], [place], [place] [address]  Magnesium     Status: Abnormal   Collection Time: 08/29/22  6:35 AM  Result Value Ref Range   Magnesium 1.3 (L) 1.7 - 2.4 mg/dL    Comment: Performed at [facility] [address], [place], [place] [address]  Glucose, capillary     Status: Abnormal   Collection Time: 08/29/22  6:35 AM  Result Value Ref Range   Glucose-Capillary 110 (H) 70 - 99 mg/dL    Comment: Glucose reference range applies only to samples taken after fasting for at least 8 hours.  TSH     Status: None   Collection Time: 08/29/22  6:35 AM  Result Value Ref Range   TSH 2.263 0.350 - 4.500 uIU/mL    Comment: Performed by a 3rd Generation assay with a functional sensitivity of <=0.01 uIU/mL. Performed at [facility], [place] [address], [place], [place] [address]   Prepare RBC (crossmatch)     Status: None   Collection Time: 08/29/22  7:37 AM  Result Value Ref Range   Order Confirmation      ORDER PROCESSED BY BLOOD BANK Performed at [facility], [place] [address], [place],  [address]   Prepare platelet pheresis     Status: None (Preliminary result)   Collection Time: 08/29/22  8:34 AM  Result Value Ref Range   Unit Number [id number]    Blood Component Type PLTP2 PSORALEN TREATED    Unit division 00    Status of Unit ALLOCATED    Transfusion Status OK TO TRANSFUSE     [place] EKG SITE RITE  Result Date: 08/29/2022 If Site Rite image not attached, placement could not be confirmed due to current cardiac rhythm.  CT ANGIO  GI BLEED  Result Date: 08/28/2022 CLINICAL DATA:  Duodenal  ulcer, gastrointestinal hemorrhage, coffee-ground emesis EXAM: CTA ABDOMEN AND PELVIS WITHOUT AND WITH CONTRAST TECHNIQUE: Multidetector CT imaging of the abdomen and pelvis was performed using the standard protocol during bolus administration of intravenous contrast. Multiplanar reconstructed images and MIPs were obtained and reviewed to evaluate the vascular anatomy. RADIATION DOSE REDUCTION: This exam was performed according to the departmental dose-optimization program which includes automated exposure control, adjustment of the mA and/or kV according to patient size and/or use of iterative reconstruction technique. CONTRAST:  179m OMNIPAQUE IOHEXOL 350 MG/ML SOLN COMPARISON:  None Available. FINDINGS: VASCULAR Aorta: Normal caliber aorta without aneurysm, dissection, vasculitis or significant stenosis. Mild atherosclerotic calcification Celiac: Mild mass effect related to the median arcuate ligament without hemodynamically significant stenosis. Variant anatomy with the common hepatic artery giving rise to the gastroduodenal artery and left hepatic artery. Accessory left hepatic artery noted. No aneurysm or dissection. SMA: Widely patent. Replaced right hepatic artery. No aneurysm or dissection. Renals: Dual right and single left renal arteries. Superior most right renal artery demonstrates a high-grade (greater than 75%) stenosis at its origin. Additionally, the vessel demonstrates a beaded appearance within its mid segment in keeping with changes of fibromuscular dysplasia. Remaining renal arteries are widely patent and demonstrate normal vascular morphology. No aneurysm or dissection. IMA: Diminutive, but patent. Inflow: Patent without evidence of aneurysm, dissection, vasculitis or significant stenosis. Proximal Outflow: Bilateral common femoral and visualized portions of the superficial and profunda femoral arteries are patent without evidence of aneurysm, dissection, vasculitis or significant stenosis.  Veins: Unremarkable. Review of the MIP images confirms the above findings. NON-VASCULAR Lower chest: Visualized lung bases are clear bilaterally. Pacemaker leads are seen within the right heart. Small hiatal hernia. Hepatobiliary: No focal liver abnormality is seen. Status post cholecystectomy. No biliary dilatation. Pancreas: 14 mm simple cyst noted within the body of the pancreas demonstrating no enhancement or internal architecture. The pancreas is otherwise unremarkable. Spleen: Unremarkable Adrenals/Urinary Tract: Bilateral benign adrenal adenomas measuring 13 mm in greatest dimension. The kidneys are normal in size and position. No hydronephrosis. No intrarenal or ureteral calculi. The bladder is unremarkable. Stomach/Bowel: There is active extravasation involving the second portion of the duodenum just above the ampulla. This is best seen on coronal image # 57, series 9. Surgical changes of right hemicolectomy are identified. The stomach, small bowel, and large bowel are otherwise unremarkable. No free intraperitoneal gas or fluid. Lymphatic: No pathologic adenopathy within the abdomen and pelvis. Reproductive: Status post hysterectomy. No adnexal masses. Other: No abdominal wall hernia Musculoskeletal: No acute bone abnormality. Osseous structures are age-appropriate. IMPRESSION: Active extravasation involving second portion of the duodenum. Fibromuscular dysplasia involving a superior accessory right renal artery. Superimposed hemodynamically significant stenosis this vessel origin 14 mm simple cyst within the body of the pancreas. No enhancing intrapancreatic mass identified. Benign bilateral adrenal adenoma. Aortic Atherosclerosis (ICD10-I70.0). These results were called by telephone at the time of interpretation on 08/28/2022 at 9:43 pm to provider BSharion Settler NP, who verbally acknowledged these results. Electronically Signed   By: AFidela SalisburyM.D.   On: 08/28/2022 21:44   DG Abd Portable  2V  Result Date: 08/27/2022 CLINICAL DATA:  Abdominal pain EXAM: PORTABLE ABDOMEN - 2 VIEW COMPARISON:  None Available. FINDINGS: Multiple gas-filled loops of large and small bowel. No abnormal distension. Gas in the rectum. No intraperitoneal free air. Degenerative change of the lumbar spine with dextroscoliosis. No pathologic calcifications. IMPRESSION: 1. Gas filled colon  and small bowel without evidence obstruction. No pathologic dilatation. 2. No free air. Electronically Signed   By: Suzy Bouchard M.D.   On: 08/27/2022 14:03    ROS - all of the below systems have been reviewed with the patient and positives are indicated with bold text General: chills, fever or night sweats Eyes: blurry vision or double vision ENT: epistaxis or sore throat Hematologic/Lymphatic: bleeding problems, blood clots or swollen lymph nodes Endocrine: temperature intolerance or unexpected weight changes Breast: new or changing breast lumps or nipple discharge Resp: cough, shortness of breath, or wheezing CV: chest pain or dyspnea on exertion GI: as per HPI GU: dysuria, trouble voiding, or hematuria Neuro: TIA or stroke symptoms    PE Blood pressure 115/64, pulse (!) 129, temperature 98.6 F (37 C), temperature source Oral, resp. rate 18, height '5\' 2"'$  (1.575 m), weight 73.3 kg, SpO2 97 %. Constitutional: NAD; conversant; no deformities Eyes: Moist conjunctiva; no lid lag; anicteric; PERRL Neck: Trachea midline; no thyromegaly Lungs: Normal respiratory effort CV: RRR GI: Abd soft, nontender MSK: Normal range of motion of extremities; no clubbing/cyanosis Psychiatric: Appropriate affect; alert and oriented x3  Results for orders placed or performed during the hospital encounter of 08/28/22 (from the past 48 hour(s))  Type and screen [place]     Status: None (Preliminary result)   Collection Time: 08/29/22  6:27 AM  Result Value Ref Range   ABO/RH(D) A POS    Antibody Screen NEG     Sample Expiration 09/01/2022,2359    Unit Number [id number]    Blood Component Type RED CELLS,LR    Unit division 00    Status of Unit ISSUED    Transfusion Status OK TO TRANSFUSE    Crossmatch Result      Compatible Performed at [facility], [address] [place], [place] [address]    Unit Number [id number]    Blood Component Type RED CELLS,LR    Unit division 00    Status of Unit ALLOCATED    Transfusion Status OK TO TRANSFUSE    Crossmatch Result Compatible   CBC     Status: Abnormal   Collection Time: 08/29/22  6:35 AM  Result Value Ref Range   WBC 11.6 (H) 4.0 - 10.5 K/uL   RBC 1.93 (L) 3.87 - 5.11 MIL/uL   Hemoglobin 6.0 (LL) 12.0 - 15.0 g/dL    Comment: REPEATED TO VERIFY THIS CRITICAL RESULT HAS VERIFIED AND BEEN CALLED TO Fernande Boyden RN BY MELISSA BOOSTEDT ON 09 03 2023 AT 0726, AND HAS BEEN READ BACK.     HCT 17.9 (L) 36.0 - 46.0 %   MCV 92.7 80.0 - 100.0 fL   MCH 31.1 26.0 - 34.0 pg   MCHC 33.5 30.0 - 36.0 g/dL   RDW 15.6 (H) 11.5 - 15.5 %   Platelets 102 (L) 150 - 400 K/uL   nRBC 0.0 0.0 - 0.2 %    Comment: Performed at [facility] [address], [place], [place] [address]  Comprehensive metabolic panel     Status: Abnormal   Collection Time: 08/29/22  6:35 AM  Result Value Ref Range   Sodium 139 135 - 145 mmol/L   Potassium 3.4 (L) 3.5 - 5.1 mmol/L   Chloride 114 (H) 98 - 111 mmol/L   CO2 17 (L) 22 - 32 mmol/L   Glucose, Bld 117 (H) 70 - 99 mg/dL    Comment: Glucose reference range applies only to  samples taken after fasting for at least 8 hours.   BUN 30 (H) 8 - 23 mg/dL   Creatinine, Ser 0.74 0.44 - 1.00 mg/dL   Calcium 7.6 (L) 8.9 - 10.3 mg/dL   Total Protein 4.0 (L) 6.5 - 8.1 g/dL   Albumin 2.3 (L) 3.5 - 5.0 g/dL   AST 24 15 - 41 U/L   ALT 14 0 - 44 U/L   Alkaline Phosphatase 26 (L) 38 - 126 U/L   Total Bilirubin 0.5 0.3 - 1.2 mg/dL   GFR, Estimated >60 >60 mL/min    Comment: (NOTE) Calculated using the CKD-EPI Creatinine  Equation (2021)    Anion gap 8 5 - 15    Comment: Performed at [facility], [place] [address], [place], [place] [address]  Magnesium     Status: Abnormal   Collection Time: 08/29/22  6:35 AM  Result Value Ref Range   Magnesium 1.3 (L) 1.7 - 2.4 mg/dL    Comment: Performed at [facility] [address], [place], [place] [address]  Glucose, capillary     Status: Abnormal   Collection Time: 08/29/22  6:35 AM  Result Value Ref Range   Glucose-Capillary 110 (H) 70 - 99 mg/dL    Comment: Glucose reference range applies only to samples taken after fasting for at least 8 hours.  TSH     Status: None   Collection Time: 08/29/22  6:35 AM  Result Value Ref Range   TSH 2.263 0.350 - 4.500 uIU/mL    Comment: Performed by a 3rd Generation assay with a functional sensitivity of <=0.01 uIU/mL. Performed at [facility], [place] [address] [place], [place] [address]   Prepare RBC (crossmatch)     Status: None   Collection Time: 08/29/22  7:37 AM  Result Value Ref Range   Order Confirmation      ORDER PROCESSED BY BLOOD BANK Performed at [facility], [place] [address], [place], [place] [address]   Prepare platelet pheresis     Status: None (Preliminary result)   Collection Time: 08/29/22  8:34 AM  Result Value Ref Range   Unit Number [id number]    Blood Component Type PLTP2 PSORALEN TREATED    Unit division 00    Status of Unit ALLOCATED    Transfusion Status OK TO TRANSFUSE     [place] EKG SITE RITE  Result Date: 08/29/2022 If Site Rite image not attached, placement could not be confirmed due to current cardiac rhythm.  CT ANGIO GI BLEED  Result Date: 08/28/2022 CLINICAL DATA:  Duodenal ulcer, gastrointestinal hemorrhage, coffee-ground emesis EXAM: CTA ABDOMEN AND PELVIS WITHOUT AND WITH CONTRAST TECHNIQUE: Multidetector CT imaging of the abdomen and pelvis was performed using the standard protocol during bolus administration of intravenous contrast. Multiplanar  reconstructed images and MIPs were obtained and reviewed to evaluate the vascular anatomy. RADIATION DOSE REDUCTION: This exam was performed according to the departmental dose-optimization program which includes automated exposure control, adjustment of the mA and/or kV according to patient size and/or use of iterative reconstruction technique. CONTRAST:  121m OMNIPAQUE IOHEXOL 350 MG/ML SOLN COMPARISON:  None Available. FINDINGS: VASCULAR Aorta: Normal caliber aorta without aneurysm, dissection, vasculitis or significant stenosis. Mild atherosclerotic calcification Celiac: Mild mass effect related to the median arcuate ligament without hemodynamically significant stenosis. Variant anatomy with the common hepatic artery giving rise to the gastroduodenal artery and left hepatic artery. Accessory left hepatic artery noted. No aneurysm or dissection. SMA: Widely patent.  Replaced right hepatic artery. No aneurysm or dissection. Renals: Dual right and single left renal arteries. Superior most right renal artery demonstrates a high-grade (greater than 75%) stenosis at its origin. Additionally, the vessel demonstrates a beaded appearance within its mid segment in keeping with changes of fibromuscular dysplasia. Remaining renal arteries are widely patent and demonstrate normal vascular morphology. No aneurysm or dissection. IMA: Diminutive, but patent. Inflow: Patent without evidence of aneurysm, dissection, vasculitis or significant stenosis. Proximal Outflow: Bilateral common femoral and visualized portions of the superficial and profunda femoral arteries are patent without evidence of aneurysm, dissection, vasculitis or significant stenosis. Veins: Unremarkable. Review of the MIP images confirms the above findings. NON-VASCULAR Lower chest: Visualized lung bases are clear bilaterally. Pacemaker leads are seen within the right heart. Small hiatal hernia. Hepatobiliary: No focal liver abnormality is seen. Status post  cholecystectomy. No biliary dilatation. Pancreas: 14 mm simple cyst noted within the body of the pancreas demonstrating no enhancement or internal architecture. The pancreas is otherwise unremarkable. Spleen: Unremarkable Adrenals/Urinary Tract: Bilateral benign adrenal adenomas measuring 13 mm in greatest dimension. The kidneys are normal in size and position. No hydronephrosis. No intrarenal or ureteral calculi. The bladder is unremarkable. Stomach/Bowel: There is active extravasation involving the second portion of the duodenum just above the ampulla. This is best seen on coronal image # 57, series 9. Surgical changes of right hemicolectomy are identified. The stomach, small bowel, and large bowel are otherwise unremarkable. No free intraperitoneal gas or fluid. Lymphatic: No pathologic adenopathy within the abdomen and pelvis. Reproductive: Status post hysterectomy. No adnexal masses. Other: No abdominal wall hernia Musculoskeletal: No acute bone abnormality. Osseous structures are age-appropriate. IMPRESSION: Active extravasation involving second portion of the duodenum. Fibromuscular dysplasia involving a superior accessory right renal artery. Superimposed hemodynamically significant stenosis this vessel origin 14 mm simple cyst within the body of the pancreas. No enhancing intrapancreatic mass identified. Benign bilateral adrenal adenoma. Aortic Atherosclerosis (ICD10-I70.0). These results were called by telephone at the time of interpretation on 08/28/2022 at 9:43 pm to provider Sharion Settler, NP, who verbally acknowledged these results. Electronically Signed   By: Fidela Salisbury M.D.   On: 08/28/2022 21:44   DG Abd Portable 2V  Result Date: 08/27/2022 CLINICAL DATA:  Abdominal pain EXAM: PORTABLE ABDOMEN - 2 VIEW COMPARISON:  None Available. FINDINGS: Multiple gas-filled loops of large and small bowel. No abnormal distension. Gas in the rectum. No intraperitoneal free air. Degenerative change of the  lumbar spine with dextroscoliosis. No pathologic calcifications. IMPRESSION: 1. Gas filled colon and small bowel without evidence obstruction. No pathologic dilatation. 2. No free air. Electronically Signed   By: Suzy Bouchard M.D.   On: 08/27/2022 14:03     A/P: Regina Hernandez is an 88 y.o. female with acute UGI bleeding from duodenal ulcer.  She has had attempts for coagulation with EGD and vascular embolization and continues to have signs of ongoing bleeding.  I have discussed her goals of care with the patient and her daughter (who is an Therapist, sports).  They both do not feel that a large surgical intervention would be a reasonable option for her at her age and with her current co-morbidities.    We will be available to answer questions and re-assess as her condition progresses, but currently there is no plan to escalate to operative intervention if current minimally invasive techniques fail.     Rosario Adie, MD  Colorectal and General Surgery [facility] Surgery     high medical  decision making.

## 2022-08-29 NOTE — Progress Notes (Addendum)
Peripherally Inserted Central Catheter Placement  The IV Nurse has discussed with the patient and/or persons authorized to consent for the patient, the purpose of this procedure and the potential benefits and risks involved with this procedure.  The benefits include less needle sticks, lab draws from the catheter, and the patient may be discharged home with the catheter. Risks include, but not limited to, infection, bleeding, blood clot (thrombus formation), and puncture of an artery; nerve damage and irregular heartbeat and possibility to perform a PICC exchange if needed/ordered by physician.  Alternatives to this procedure were also discussed.  Bard Power PICC patient education guide, fact sheet on infection prevention and patient information card has been provided to patient /or left at bedside.  Dtr at bedside gave consent, pt also agreeable.  PICC Placement Documentation  PICC Triple Lumen 93/79/02 Right Basilic 38 cm 0 cm (Active)  Indication for Insertion or Continuance of Line Vasoactive infusions;Administration of hyperosmolar/irritating solutions (i.e. TPN, Vancomycin, etc.);Poor Vasculature-patient has had multiple peripheral attempts or PIVs lasting less than 24 hours;Limited venous access - need for IV therapy >5 days (PICC only);Chronic illness with exacerbations (CF, Sickle Cell, etc.) 08/29/22 1501  Exposed Catheter (cm) 0 cm 08/29/22 1501  Site Assessment Clean, Dry, Intact 08/29/22 1501  Lumen #1 Status Flushed;Saline locked;Blood return noted 08/29/22 1501  Lumen #2 Status Flushed;Saline locked;Blood return noted 08/29/22 1501  Lumen #3 Status Flushed;Saline locked;Blood return noted 08/29/22 1501  Dressing Type Transparent;Securing device 08/29/22 1501  Dressing Status Antimicrobial disc in place;Clean, Dry, Intact 08/29/22 1501  Safety Lock Not Applicable 40/97/35 3299  Line Care Connections checked and tightened 08/29/22 1501  Line Adjustment (NICU/IV Team Only) No 08/29/22  1501  Dressing Intervention New dressing 08/29/22 1501  Dressing Change Due 09/05/22 08/29/22 1501       Rolena Infante 08/29/2022, 3:02 PM

## 2022-08-29 NOTE — Assessment & Plan Note (Addendum)
Due to UGIB 1. 2u PRBC transfusion on 9/1 2. Most recent HGB 8.6 3. Getting stat repeat H/H now and Q6H 4. Type and screen 5. Tele monitor 1. Still having S.Tach, though 120 currently, improved from 140s at time of transfer. 6. IVF: LR at 125 cc/hr for the moment 7. May require additional transfusions depending on HGB results. 8. Will let her have some ice chips, but otherwise keeping NPO for at least a couple more hours until we are convinced she wont bleed again.

## 2022-08-29 NOTE — Assessment & Plan Note (Signed)
Resume home psych meds when med rec complete and able to take POs

## 2022-08-29 NOTE — Consult Note (Signed)
Regina Hernandez, MRN:  [id number], DOB:  02/10/1934, LOS: 1 ADMISSION DATE:  08/28/2022, CONSULTATION DATE:  08/29/2022  REFERRING MD:  Regina Hernandez, CHIEF COMPLAINT:  hemorrhagic shock    History of Present Illness:  88 year old woman with recurrent UGIB with signs of hemorrhagic shock. HB: 6, patient normotensive, but atrial fibrillation rate control worse.  Passed maroon stool this morning. No nausea or vomiting. No further stools. Denies abdominal pain.   Had presented to [facility] 9/1 with coffee ground emesis and periumbilical pain.  Two duodenal ulcers were found, both with visible bleeding vessels. Cautery was unsuccessful.  Transferred to [facility]ina [facility] for angiography - no active contrast extravasation seen.  Empiric coiling of GDA. Received 1 Hernandez PRBC and started on PPI IV.   Pertinent  Medical History   Past Medical History:  Diagnosis Date   Anxiety    Cervical dystonia    Essential tremor    GI bleed    Memory loss    Migraine    Occipital neuralgia    Past Surgical History:  Procedure Laterality Date   BOWEL RESECTION     CARDIAC CATHETERIZATION  2016   no stents/Regina Hernandez   ESOPHAGOGASTRODUODENOSCOPY N/A 06/18/2020   Procedure: ESOPHAGOGASTRODUODENOSCOPY (EGD);  Surgeon: Toledo, Benay Pike, MD;  Location: [facility] ENDOSCOPY;  Service: Gastroenterology;  Laterality: N/A;   PACEMAKER IMPLANT     REPLACEMENT TOTAL KNEE Left     Significant Hospital Events: Including procedures, antibiotic start and stop dates in addition to other pertinent events   9/1 admitted to [facility] with active duodenal ulcer.  9/3 IR GDA embolization [facility] 9/3 concern for recurrent bleeding  Interim History / Subjective:  Currently normotensive in SR and denies abdominal pain.   Objective   Blood pressure 97/61, pulse (!) 127, temperature 99 F (37.2 C), temperature source Oral, resp. rate (!) 24, height '5\' 2"'$  (1.575 m), weight 73.3 kg, SpO2 97 %.        Intake/Output Summary (Last 24 hours) at  08/29/2022 0907 Last data filed at 08/29/2022 0601 Gross per 24 hour  Intake 291.65 ml  Output 1300 ml  Net -1008.35 ml   Filed Weights   08/29/22 0324  Weight: 73.3 kg    Examination: General: very frail appearing woman with significantly decrease muscle mass. Extremely pale.  HENT: edentulous, pale sclerae Lungs: clear  Cardiovascular: 3/6 CDC murmur over aortic area.  Abdomen: Soft  Extremities: generalized edema. Decreased muscle mass.  Neuro: no focal deficits.  GU: Foley catheter in place with adequate urine output.   Ancillary tests personally reviewed:   HB 6.0 PLT 102 Cr: 0.74 Hyperchloremic acidosis.   Assessment & Plan:  Critically ill due to suspected hemorrhagic shock from bleeding duodenal ulcers, S/P GDA embolization.  BP has been stable and HR not reliable marker of shock due to atrial fibrillation.  Anemia may be due to hemodilution.  - Transfuse to HB of 8, follow serial HB - Transfuse PLT empirically for mild thrombocytopenia and possible platelet dysfunction.  - Maintain MAP>65, avoid crystalloids and overcorrecting hypotension as these may disrupt hemostatic plug.  - Continue IV PPI  - Add octreotide - Seen by surgery. Poor surgical candidate given co-morbidities. Per surgical resident's conversation with daughter, will transition to comfort care if apparent that will not survive without surgery.   Atrial fibrillation with rapid ventricular response  Normal LV function with aortic valve sclerosis per echo 04/23 Likely tachy-brady syndrome given PPM. - HR control has worsened since  admission, likely due to stress. Not on home rate control agent.  - Ensure shock and anemia are corrected before starting rate control agent.  - To remain off anticoagulation.   Generalized frailty and moderate malnutrition: - Likely protracted recovery - Institute enteral nutrition as soon as practical including protein supplementation.    Best Practice (right click and  "Reselect all SmartList Selections" daily)   Diet/type: NPO DVT prophylaxis: not indicated GI prophylaxis: PPI Lines: N/A -  PICC line ordered Foley:  Yes, and it is still needed Code Status:  DNR Last date of multidisciplinary goals of care discussion [updated by Drs Regina Hernandez and Marcello Moores ]  CRITICAL CARE Performed by: Regina Hernandez   Total critical care time: 60 minutes  Critical care time was exclusive of separately billable procedures and treating other patients.  Critical care was necessary to treat or prevent imminent or life-threatening deterioration.  Critical care was time spent personally by me on the following activities: development of treatment plan with patient and/or surrogate as well as nursing, discussions with consultants, evaluation of patient's response to treatment, examination of patient, obtaining history from patient or surrogate, ordering and performing treatments and interventions, ordering and review of laboratory studies, ordering and review of radiographic studies, pulse oximetry, re-evaluation of patient's condition and participation in multidisciplinary rounds.  Regina Brood, MD [facility] ICU Physician [place]  Pager: [phone] Mobile: [phone] After hours: [phone].

## 2022-08-29 NOTE — Progress Notes (Signed)
Date and time results received: 08/29/22 2306 (use smartphrase ".now" to insert current time)  Test: K+ Critical Value: 2.7  Name of Provider Notified: Elink Provider  Orders Received? Or Actions Taken?:  Awaiting new orders

## 2022-08-29 NOTE — Progress Notes (Addendum)
PROGRESS NOTE                                                                                                                                                                                                             Patient Demographics:    Regina Hernandez, is a 88 y.o. female, DOB - 05-02-1934, EPP:[id number]  Outpatient Primary MD for the patient is Regina Noe, MD    LOS - 1  Admit date - 08/28/2022    No chief complaint on file.      Brief Narrative (HPI from H&P)  88 y.o. female with medical history significant of depression, anxiety, essential tremor, bradycardia status post permanent pacemaker insertion, history of peptic ulcer disease who was brought into the ER at [facility] on 9/1 by her daughter for evaluation of coffee-ground emesis.   Pt admitted to [facility] on 9/1 for UGIB with hematemesis, ABLA.  2u PRBC transfusion performed.  Hypotensive down to 80s. Had 1st EGD: bleeding duodenal ulcer that they cauterized. Continued to have large amount of hematemesis had 2nd EGD, also unsuccessful at controlling bleeding, as subsequently general surgery was first consulted this evening for ongoing bleeding with BRBPR.  CTA performed showed ongoing extravasation of contrast.  Pt was then emergently transferred to [facility] for IR coil embolization which patient is still post-op from as I write this note.  Looks like this was, hopefully, successful as Dr. Maryelizabeth Kaufmann found active extravasation and performed coil embolization which was ?  Successful.   Subjective:    Regina Hernandez today has, No headache, No chest pain, No abdominal pain - No Nausea, No new weakness tingling or numbness, no SOB, ++ red BM just now.   Assessment  & Plan :    Ongoing acute upper GI bleed due to duodenal ulcer with acute blood loss related anemia . She was treated at [facility] for the first 2 days received 2 units of packed RBC transfusion, had 2 EGDs done at [facility]  with unsuccessful cauterization, she was also seen by general surgery at [facility], it was decided that she would be transferred to [facility] on 08/28/2022 where she was seen by IR mesenteric artery embolization procedure was performed with questionable success.  Post embolization early morning of 08/29/2022 patient had a large red bowel movement, she said she has had 2 more in  the last hour, hemoglobin has dropped to 6 with tachycardia and hypotension.  I have ordered 2 units of stat BC transfusion, have called lab to dispense the blood right away, IV fluid bolus 1 L, IV PPI drip, have consulted both GI and general surgery.  Also have consulted ICU for resuscitation in ICU.    Have talked to patient's daughter Regina Hernandez who patient lives with, patient is a functional 88 year old, although she is DNR they want medical management done, they are open for EGD, they will discuss about surgical options based on the scenario.    Essential hypertension - BP soft, hold blood pressure medications.  Mild cognitive impairment with memory loss - Resume home psych meds when med rec complete and able to take POs.       Condition - Extremely Guarded  Family Communication  :    Daughter Regina Hernandez [phone] on 08/29/22  Code Status :  DNR  Consults  :  IR, general surgery, GI, ICU  PUD Prophylaxis : PPI   Procedures  :     IR embolization -  Diagnosis: UGIB. Active extravasation from duodenal ulcer.   Procedure: MESENTERIC ARTERIOGRAPHY COIL EMBOLIZATION of GASTRODUODENAL ARTERY   Findings:   - access via the RIGHT femoral artery. - variant splanchnic anatomy with replaced RHA from the SMA - senescent, tortuous abdominal aorta with stenosed celiac artery origin - no active extravasation noted at duodenum - successful empiric coil embolization of GDA by exclusion technique - AngioSeal closure at the R groin with distal RLE pulses at the end of the case.   EGD x 2 at [place] - bleeding duodenal  ulcer, cauterized  CTA - Active extravasation involving second portion of the duodenum. Fibromuscular dysplasia involving a superior accessory right renal artery. Superimposed hemodynamically significant stenosis this vessel origin 14 mm simple cyst within the body of the pancreas. No enhancing intrapancreatic mass identified. Benign bilateral adrenal adenoma. Aortic Atherosclerosis (ICD10-I70.0).      Disposition Plan  :    Status is: Inpatient  DVT Prophylaxis  :    SCDs Start: 08/29/22 0102   Lab Results  Component Value Date   PLT 102 (L) 08/29/2022    Diet :  Diet Order             Diet NPO time specified Except for: Sips with Meds, Ice Chips  Diet effective now                    Inpatient Medications  Scheduled Meds:  sodium chloride   Intravenous Once   furosemide  20 mg Intravenous Q6H   pantoprazole (PROTONIX) IV  40 mg Intravenous Q12H   Continuous Infusions:  lactated ringers 125 mL/hr at 08/29/22 0427   PRN Meds:.acetaminophen **OR** acetaminophen, ondansetron **OR** ondansetron (ZOFRAN) IV  Antibiotics  :    Anti-infectives (From admission, onward)    Start     Dose/Rate Route Frequency Ordered Stop   08/29/22 0057  ceFAZolin (ANCEF) IVPB 2g/100 mL premix        over 30 Minutes Intravenous Continuous PRN 08/29/22 0104 08/29/22 0127        Time Spent in minutes  30   Lala Lund M.D on 08/29/2022 at 7:37 AM  To page go to [email or web address]   [facility] -  Office  [phone]  See all Orders from today for further details    Objective:   Vitals:   08/29/22 0245 08/29/22 0324 08/29/22 0500 08/29/22 0530  BP: (!) 110/98  92/68 96/64 100/81  Pulse: (!) 127 (!) 125 (!) 120 (!) 128  Resp: '20 20 20 20  '$ Temp:  98.4 F (36.9 C) 98.4 F (36.9 C) 98.5 F (36.9 C)  TempSrc:  Oral Oral Oral  SpO2: 100% 98% 99% 100%  Weight:  73.3 kg    Height:  '5\' 2"'$  (1.575 m)      Wt Readings from Last 3 Encounters:  08/29/22 73.3 kg   08/27/22 70.3 kg  08/20/22 70.5 kg     Intake/Output Summary (Last 24 hours) at 08/29/2022 0737 Last data filed at 08/29/2022 0601 Gross per 24 hour  Intake 291.65 ml  Output 1300 ml  Net -1008.35 ml     Physical Exam  Awake Alert, No new F.N deficits, Normal affect [place].AT,PERRAL Supple Neck, No JVD,   Symmetrical Chest wall movement, Good air movement bilaterally, CTAB RRR,No Gallops,Rubs or new Murmurs,  +ve B.Sounds, Abd Soft, No tenderness,   No Cyanosis, Clubbing or edema        Data Review:    CBC Recent Labs  Lab 08/27/22 0606 08/27/22 0932 08/28/22 0159 08/28/22 0848 08/28/22 1158 08/28/22 1758 08/29/22 0635  WBC 11.9*  --   --   --   --   --  11.6*  HGB 11.4*   < > 8.7* 8.7* 9.2* 8.6* 6.0*  HCT 36.6   < > 27.1* 26.7* 28.4* 26.0* 17.9*  PLT 194  --   --   --   --   --  102*  MCV 94.1  --   --   --   --   --  92.7  MCH 29.3  --   --   --   --   --  31.1  MCHC 31.1  --   --   --   --   --  33.5  RDW 14.6  --   --   --   --   --  15.6*  LYMPHSABS 1.1  --   --   --   --   --   --   MONOABS 0.4  --   --   --   --   --   --   EOSABS 0.1  --   --   --   --   --   --   BASOSABS 0.1  --   --   --   --   --   --    < > = values in this interval not displayed.    Electrolytes Recent Labs  Lab 08/27/22 0606 08/28/22 0159  NA 139 141  K 5.1 4.3  CL 108 114*  CO2 26 23  GLUCOSE 122* 99  BUN 41* 29*  CREATININE 1.01* 0.74  CALCIUM 9.8 7.9*  AST 23  --   ALT 13  --   ALKPHOS 53  --   BILITOT 1.0  --   ALBUMIN 3.8  --   INR 1.1  --     ID Labs Recent Labs  Lab 08/27/22 0606 08/28/22 0159 08/29/22 0635  WBC 11.9*  --  11.6*  PLT 194  --  102*  CREATININE 1.01* 0.74  --     Radiology Reports CT ANGIO GI BLEED  Result Date: 08/28/2022 CLINICAL DATA:  Duodenal ulcer, gastrointestinal hemorrhage, coffee-ground emesis EXAM: CTA ABDOMEN AND PELVIS WITHOUT AND WITH CONTRAST TECHNIQUE: Multidetector CT imaging of the abdomen and pelvis was performed  using the standard protocol during bolus administration of intravenous  contrast. Multiplanar reconstructed images and MIPs were obtained and reviewed to evaluate the vascular anatomy. RADIATION DOSE REDUCTION: This exam was performed according to the departmental dose-optimization program which includes automated exposure control, adjustment of the mA and/or kV according to patient size and/or use of iterative reconstruction technique. CONTRAST:  121m OMNIPAQUE IOHEXOL 350 MG/ML SOLN COMPARISON:  None Available. FINDINGS: VASCULAR Aorta: Normal caliber aorta without aneurysm, dissection, vasculitis or significant stenosis. Mild atherosclerotic calcification Celiac: Mild mass effect related to the median arcuate ligament without hemodynamically significant stenosis. Variant anatomy with the common hepatic artery giving rise to the gastroduodenal artery and left hepatic artery. Accessory left hepatic artery noted. No aneurysm or dissection. SMA: Widely patent. Replaced right hepatic artery. No aneurysm or dissection. Renals: Dual right and single left renal arteries. Superior most right renal artery demonstrates a high-grade (greater than 75%) stenosis at its origin. Additionally, the vessel demonstrates a beaded appearance within its mid segment in keeping with changes of fibromuscular dysplasia. Remaining renal arteries are widely patent and demonstrate normal vascular morphology. No aneurysm or dissection. IMA: Diminutive, but patent. Inflow: Patent without evidence of aneurysm, dissection, vasculitis or significant stenosis. Proximal Outflow: Bilateral common femoral and visualized portions of the superficial and profunda femoral arteries are patent without evidence of aneurysm, dissection, vasculitis or significant stenosis. Veins: Unremarkable. Review of the MIP images confirms the above findings. NON-VASCULAR Lower chest: Visualized lung bases are clear bilaterally. Pacemaker leads are seen within the right  heart. Small hiatal hernia. Hepatobiliary: No focal liver abnormality is seen. Status post cholecystectomy. No biliary dilatation. Pancreas: 14 mm simple cyst noted within the body of the pancreas demonstrating no enhancement or internal architecture. The pancreas is otherwise unremarkable. Spleen: Unremarkable Adrenals/Urinary Tract: Bilateral benign adrenal adenomas measuring 13 mm in greatest dimension. The kidneys are normal in size and position. No hydronephrosis. No intrarenal or ureteral calculi. The bladder is unremarkable. Stomach/Bowel: There is active extravasation involving the second portion of the duodenum just above the ampulla. This is best seen on coronal image # 57, series 9. Surgical changes of right hemicolectomy are identified. The stomach, small bowel, and large bowel are otherwise unremarkable. No free intraperitoneal gas or fluid. Lymphatic: No pathologic adenopathy within the abdomen and pelvis. Reproductive: Status post hysterectomy. No adnexal masses. Other: No abdominal wall hernia Musculoskeletal: No acute bone abnormality. Osseous structures are age-appropriate. IMPRESSION: Active extravasation involving second portion of the duodenum. Fibromuscular dysplasia involving a superior accessory right renal artery. Superimposed hemodynamically significant stenosis this vessel origin 14 mm simple cyst within the body of the pancreas. No enhancing intrapancreatic mass identified. Benign bilateral adrenal adenoma. Aortic Atherosclerosis (ICD10-I70.0). These results were called by telephone at the time of interpretation on 08/28/2022 at 9:43 pm to provider BSharion Settler NP, who verbally acknowledged these results. Electronically Signed   By: AFidela SalisburyM.D.   On: 08/28/2022 21:44   DG Abd Portable 2V  Result Date: 08/27/2022 CLINICAL DATA:  Abdominal pain EXAM: PORTABLE ABDOMEN - 2 VIEW COMPARISON:  None Available. FINDINGS: Multiple gas-filled loops of large and small bowel. No abnormal  distension. Gas in the rectum. No intraperitoneal free air. Degenerative change of the lumbar spine with dextroscoliosis. No pathologic calcifications. IMPRESSION: 1. Gas filled colon and small bowel without evidence obstruction. No pathologic dilatation. 2. No free air. Electronically Signed   By: SSuzy BouchardM.D.   On: 08/27/2022 14:03

## 2022-08-29 NOTE — Progress Notes (Signed)
Rapid and critical care to bed side . Pt has new order to transfer to ICU, 1st unit of blood was started along with Protonix .     08/29/22 0842  Vitals  Temp 99 F (37.2 C)  Temp Source Oral  BP 97/61  MAP (mmHg) 73  BP Location Left Arm  BP Method Automatic  Patient Position (if appropriate) Lying  Pulse Rate (!) 126  Pulse Rate Source Monitor  ECG Heart Rate (!) 135  Resp (!) 27  Level of Consciousness  Level of Consciousness Alert  Oxygen Therapy  SpO2 100 %  O2 Device Room Air  O2 Flow Rate (L/min) 0 L/min  ECG Monitoring  PR interval 0.14  QRS interval 0.07  QT interval 0.31  QTc interval 0.44  CV Strip Heart Rate 126  Cardiac Rhythm ST  MEWS Score  MEWS Temp 0  MEWS Systolic 1  MEWS Pulse 3  MEWS RR 2  MEWS LOC 0  MEWS Score 6  MEWS Score Color Red

## 2022-08-29 NOTE — H&P (Addendum)
History and Physical    PatientKingston Hernandez QZE:[id number] DOB: 23-Jul-1934 DOA: 08/28/2022 DOS: the patient was seen and examined on 08/29/2022 PCP: Lesleigh Noe, MD  Patient coming from:  [facility]  Chief Complaint: GIB  HPI: Regina Hernandez is a 88 y.o. female with medical history significant of depression, anxiety, essential tremor, bradycardia status post permanent pacemaker insertion, history of peptic ulcer disease who was brought into the ER at [facility] on 9/1 by her daughter for evaluation of coffee-ground emesis.  Pt admitted to [facility] on 9/1 for UGIB with hematemesis, ABLA.  2u PRBC transfusion performed.  Hypotensive down to 80s.  Had 1st EGD: bleeding duodenal ulcer that they cauterized.  Continued to have large amount of hematemesis.  Had 2nd EGD today: and attempted to re-embolize.  This 2nd attempt was also unsuccessful at controlling bleeding, as subsequently general surgery was first consulted this evening for ongoing bleeding with BRBPR.  CTA performed showed ongoing extravasation of contrast.  Pt was then emergently transferred to [facility] for IR coil embolization which patient is still post-op from as I write this note.  Looks like this was, hopefully, successful as Dr. Maryelizabeth Kaufmann found active extravasation and performed coil embolization according to his preliminary procedure note under the incomplete tab.   Review of Systems: As mentioned in the history of present illness. All other systems reviewed and are negative. Past Medical History:  Diagnosis Date   Anxiety    Cervical dystonia    Essential tremor    GI bleed    Memory loss    Migraine    Occipital neuralgia    Past Surgical History:  Procedure Laterality Date   BOWEL RESECTION     CARDIAC CATHETERIZATION  2016   no stents/[facility]   ESOPHAGOGASTRODUODENOSCOPY N/A 06/18/2020   Procedure: ESOPHAGOGASTRODUODENOSCOPY (EGD);  Surgeon: Toledo, Benay Pike, MD;  Location: [facility] ENDOSCOPY;  Service:  Gastroenterology;  Laterality: N/A;   PACEMAKER IMPLANT     REPLACEMENT TOTAL KNEE Left    Social History:  reports that she has never smoked. She has never been exposed to tobacco smoke. She has never used smokeless tobacco. She reports that she does not currently use alcohol. She reports that she does not use drugs.  Allergies  Allergen Reactions   Adhesive [Tape] Other (See Comments)    Contact dermatitis   Baclofen Rash   Lyrica [Pregabalin] Other (See Comments)    Unable to remember reaction.   Sulfa Antibiotics Rash    Family History  Problem Relation Age of Onset   Other Mother        "old age"   Ulcers Father    Stomach cancer Brother     Prior to Admission medications   Not on File    Physical Exam: Vitals:   08/29/22 0050 08/29/22 0055 08/29/22 0100 08/29/22 0105  BP: 108/72 (!) '97/57 96/73 93/73 '$  Pulse: (!) 127 (!) 124 (!) 122 (!) 120  Resp: (!) 28 (!) 24 (!) 22 17  SpO2: 100% 100% 99% 100%   Constitutional: NAD, calm, comfortable Eyes: PERRL, lids and conjunctivae normal ENMT: Mucous membranes are moist. Posterior pharynx clear of any exudate or lesions.Normal dentition.  Neck: normal, supple, no masses, no thyromegaly Respiratory: clear to auscultation bilaterally, no wheezing, no crackles. Normal respiratory effort. No accessory muscle use.  Cardiovascular: S. Tach to 120s Abdomen: no tenderness, no masses palpated. No hepatosplenomegaly. Bowel sounds positive.  Musculoskeletal: no clubbing / cyanosis. No joint deformity upper and lower extremities. Good  ROM, no contractures. Normal muscle tone.  Skin: no rashes, lesions, ulcers. No induration Neurologic: BUE resting tremor Psychiatric: Normal judgment and insight. Alert and oriented x 3. Normal mood.   Data Reviewed:       Latest Ref Rng & Units 08/28/2022    5:58 PM 08/28/2022   11:58 AM 08/28/2022    8:48 AM  CBC  Hemoglobin 12.0 - 15.0 g/dL 8.6  9.2  8.7   Hematocrit 36.0 - 46.0 % 26.0  28.4  26.7     CMP     Component Value Date/Time   NA 141 08/28/2022 0159   NA 139 07/23/2021 0000   K 4.3 08/28/2022 0159   CL 114 (H) 08/28/2022 0159   CO2 23 08/28/2022 0159   GLUCOSE 99 08/28/2022 0159   BUN 29 (H) 08/28/2022 0159   BUN 15 07/23/2021 0000   CREATININE 0.74 08/28/2022 0159   CALCIUM 7.9 (L) 08/28/2022 0159   PROT 6.9 08/27/2022 0606   PROT 6.8 02/04/2020 1439   ALBUMIN 3.8 08/27/2022 0606   ALBUMIN 4.3 02/04/2020 1439   AST 23 08/27/2022 0606   ALT 13 08/27/2022 0606   ALKPHOS 53 08/27/2022 0606   BILITOT 1.0 08/27/2022 0606   BILITOT 0.2 02/04/2020 1439   GFRNONAA >60 08/28/2022 0159   GFRAA >60 07/23/2020 0415   IMPRESSION: Active extravasation involving second portion of the duodenum.   Fibromuscular dysplasia involving a superior accessory right renal artery. Superimposed hemodynamically significant stenosis this vessel origin   14 mm simple cyst within the body of the pancreas. No enhancing intrapancreatic mass identified.   Benign bilateral adrenal adenoma.  Assessment and Plan: * Acute blood loss anemia (ABLA) Due to UGIB 2u PRBC transfusion on 9/1 Most recent HGB 8.6 Getting stat repeat H/H now and Q6H Type and screen Tele monitor Still having S.Tach, though 120 currently, improved from 140s at time of transfer. IVF: LR at 125 cc/hr for the moment May require additional transfusions depending on HGB results. Will let her have some ice chips, but otherwise keeping NPO for at least a couple more hours until we are convinced she wont bleed again.  GI bleed UGIB from duodenal ulcer(s). Unable to control bleeding after EGD x2, repeat CTA confirmed ongoing bleeding. Pt emergently transferred from [facility] to [facility] for IR Coil embolization. Hopefully this was successful, initial indications from procedure report are that it was.  Essential hypertension Hold home BP meds in setting of borderline hemorrhagic shock. On a side note, it looks like we have  incidentally discovered that she has underlying RAS of R renal artery as well. RAS can likely be followed up as outpt.  Mild cognitive impairment with memory loss Resume home psych meds when med rec complete and able to take [place]:   Code Status: DNR  Consults: IR for coil embolization as above  Family Communication: No family in room  Severity of Illness: The appropriate patient status for this patient is INPATIENT. Inpatient status is judged to be reasonable and necessary in order to provide the required intensity of service to ensure the patient's safety. The patient's presenting symptoms, physical exam findings, and initial radiographic and laboratory data in the context of their chronic comorbidities is felt to place them at high risk for further clinical deterioration. Furthermore, it is not anticipated that the patient will be medically stable for discharge from the hospital within 2 midnights of admission.   * I certify that  at the point of admission it is my clinical judgment that the patient will require inpatient hospital care spanning beyond 2 midnights from the point of admission due to high intensity of service, high risk for further deterioration and high frequency of surveillance required.*  Author: Etta Quill., DO 08/29/2022 1:11 AM  For on call review [email or web address].

## 2022-08-29 NOTE — Progress Notes (Signed)
[facility] ADULT ICU REPLACEMENT PROTOCOL   The patient does apply for the [facility] Adult ICU Electrolyte Replacment Protocol based on the criteria listed below:   1.Exclusion criteria: TCTS patients, ECMO patients, and Dialysis patients 2. Is GFR >/= 30 ml/min? Yes.    Patient's GFR today is >60 3. Is SCr </= 2? Yes.   Patient's SCr is 0.76 mg/dL 4. Did SCr increase >/= 0.5 in 24 hours? No. 5.Pt's weight >40kg  Yes.   6. Abnormal electrolyte(s): K 2.7, Mag 1.4  7. Electrolytes replaced per protocol 8.  Call MD STAT for K+ </= 2.5, Phos </= 1, or Mag </= 1 Physician:    Ronda Fairly A 08/29/2022 11:19 PM

## 2022-08-29 NOTE — Consult Note (Signed)
Consultation  Referring Provider:     Dr. Candiss Norse Primary Care Physician:  Lesleigh Noe, MD Primary Gastroenterologist:    Dr. Alice Reichert   Reason for Consultation:    Bleeding duodenal ulcer          HPI:   Regina Hernandez is a 88 y.o. female with a history of sick sinus syndrome status post pacemaker who was admitted to [facility] on September 1 with coffee-ground emesis.  She underwent an EGD in which to ulcers were found in the second portion of the duodenum, both treated with cautery and epinephrine injection.  Hemostasis appeared to have been achieved, but she continued to have evidence of rebleeding.  A second upper endoscopy was performed, and one of the ulcers was oozing and was again treated with bipolar cauterization.  The other ulcer did not have stigmata, and was not treated.  Despite this, the patient exhibited ongoing evidence of active GI bleeding, and she was transferred to [facility] last night where she underwent attempted coiling of the GDA, after a CTA exhibited active bleeding in the second portion of the duodenum.  Procedure reports not available at the time my evaluation, but was apparently unsuccessful.  She has continued to exhibit brisk bleeding, manifested by dark, maroon-colored stool with low blood pressures and A-fib with RVR. She feels weak, but otherwise denies any symptoms such as nausea, vomiting or abdominal pain. She takes no blood thinners and has no known coagulopathies.  She received 1 unit of PRBCs thus far, and is scheduled to receive another. Hemoglobin 6 this morning, down from 8.6 12 hours ago.  Past Medical History:  Diagnosis Date   Anxiety    Cervical dystonia    Essential tremor    GI bleed    Memory loss    Migraine    Occipital neuralgia     Past Surgical History:  Procedure Laterality Date   BOWEL RESECTION     CARDIAC CATHETERIZATION  2016   no stents/[facility]   ESOPHAGOGASTRODUODENOSCOPY N/A 06/18/2020   Procedure:  ESOPHAGOGASTRODUODENOSCOPY (EGD);  Surgeon: Toledo, Benay Pike, MD;  Location: [facility] ENDOSCOPY;  Service: Gastroenterology;  Laterality: N/A;   PACEMAKER IMPLANT     REPLACEMENT TOTAL KNEE Left     Family History  Problem Relation Age of Onset   Other Mother        "old age"   Ulcers Father    Stomach cancer Brother      Social History   Tobacco Use   Smoking status: Never    Passive exposure: Never   Smokeless tobacco: Never  Vaping Use   Vaping Use: Never used  Substance Use Topics   Alcohol use: Not Currently   Drug use: Never    Prior to Admission medications   Medication Sig Start Date End Date Taking? Authorizing Provider  acetaminophen (TYLENOL) 500 MG tablet Take 1,000 mg by mouth 2 (two) times daily.   Yes [provider]  amLODipine (NORVASC) 2.5 MG tablet Take 2.5 mg by mouth daily.   Yes [provider]  ARIPiprazole (ABILIFY) 2 MG tablet Take 2 mg by mouth daily.   Yes [provider]  aspirin EC 81 MG tablet Take 81 mg by mouth daily. Swallow whole.   Yes [provider]  aspirin-acetaminophen-caffeine (EXCEDRIN MIGRAINE) [phone] MG tablet Take 1 tablet by mouth every 6 (six) hours as needed for headache.   Yes [provider]  atorvastatin (LIPITOR) 10 MG tablet Take 10 mg by  mouth daily.   Yes [provider]  busPIRone (BUSPAR) 10 MG tablet Take 10 mg by mouth 2 (two) times daily.   Yes [provider]  cetirizine (ZYRTEC) 10 MG tablet Take 10 mg by mouth daily as needed for allergies.   Yes [provider]  Chlorphen-Phenyleph-Ibuprofen (ADVIL MULTI-SYMPTOM COLD & FLU) 4-10-200 MG TABS Take 1 tablet by mouth every 8 (eight) hours as needed (cold symptoms).   Yes [provider]  cholecalciferol (VITAMIN D3) 25 MCG (1000 UNIT) tablet Take 1,000 Units by mouth daily.   Yes [provider]  docusate sodium (COLACE) 100 MG capsule Take 100 mg by mouth daily as needed for mild  constipation.   Yes [provider]  gabapentin (NEURONTIN) 400 MG capsule Take 400 mg by mouth 2 (two) times daily.   Yes [provider]  memantine (NAMENDA) 10 MG tablet Take 10 mg by mouth 2 (two) times daily.   Yes [provider]  primidone (MYSOLINE) 50 MG tablet Take 100 mg by mouth daily.   Yes [provider]  venlafaxine XR (EFFEXOR XR) 150 MG 24 hr capsule Take 150 mg by mouth daily with breakfast.   Yes [provider]  vitamin B-12 (CYANOCOBALAMIN) 500 MCG tablet Take 500 mcg by mouth daily.   Yes [provider]    Current Facility-Administered Medications  Medication Dose Route Frequency Provider Last Rate Last Admin   0.9 %  sodium chloride infusion (Manually program via Guardrails IV Fluids)   Intravenous Once Kipp Brood, MD       acetaminophen (TYLENOL) tablet 650 mg  650 mg Oral Q6H PRN Etta Quill, DO   650 mg at 08/29/22 0443   Or   acetaminophen (TYLENOL) suppository 650 mg  650 mg Rectal Q6H PRN Etta Quill, DO       Chlorhexidine Gluconate Cloth 2 % PADS 6 each  6 each Topical Daily Agarwala, Einar Grad, MD       octreotide (SANDOSTATIN) 500 mcg in sodium chloride 0.9 % 250 mL (2 mcg/mL) infusion  50 mcg/hr Intravenous Continuous Agarwala, Einar Grad, MD       ondansetron (ZOFRAN) tablet 4 mg  4 mg Oral Q6H PRN Etta Quill, DO       Or   ondansetron [facility]) injection 4 mg  4 mg Intravenous Q6H PRN Etta Quill, DO       pantoprazole (PROTONIX) 80 mg /NS 100 mL IVPB  80 mg Intravenous Once Thurnell Lose, MD       pantoprazole (PROTONIX) injection 40 mg  40 mg Intravenous Q12H Thurnell Lose, MD       pantoprozole (PROTONIX) 80 mg /NS 100 mL infusion  8 mg/hr Intravenous Continuous Thurnell Lose, MD 10 mL/hr at 08/29/22 0851 8 mg/hr at 08/29/22 0851    Allergies as of 08/28/2022 - Review Complete 08/28/2022  Allergen Reaction Noted   Adhesive [tape] Other (See Comments) 02/04/2020   Baclofen  Rash 02/04/2020   Lyrica [pregabalin] Other (See Comments) 02/04/2020   Sulfa antibiotics Rash 02/04/2020     Review of Systems:    As per HPI, otherwise negative    Physical Exam:  Vital signs in last 24 hours: Temp:  [98.4 F (36.9 C)-99 F (37.2 C)] 99 F (37.2 C) (09/03 0842) Pulse Rate:  [116-132] 126 (09/03 0930) Resp:  [12-31] 18 (09/03 0930) BP: (85-121)/(53-98) 102/71 (09/03 0930) SpO2:  [95 %-100 %] 95 % (09/03 0930) Weight:  [73.3  kg] 73.3 kg (09/03 0324)   General:   Pleasant elderly Caucasian female lying in bed in NAD Head:  Normocephalic and atraumatic. Eyes:   No icterus.   Conjunctiva pale Ears:  Normal auditory acuity. Neck:  Supple Lungs:  Respirations even and unlabored. Lungs clear to auscultation bilaterally.   No wheezes, crackles, or rhonchi.  Heart: Tachycardic, systolic murmur  Abdomen:  Soft, nondistended, nontender. Normal bowel sounds. No appreciable masses or hepatomegaly.  Rectal:  Not performed.  Msk:  Symmetrical without gross deformities.  Extremities:  Without edema. Neurologic:  Alert and  oriented x4;  grossly normal neurologically. Skin:  Intact without significant lesions or rashes.  Pale Psych:  Alert and cooperative. Normal affect.  LAB RESULTS: Recent Labs    08/27/22 0606 08/27/22 0932 08/28/22 1158 08/28/22 1758 08/29/22 0635  WBC 11.9*  --   --   --  11.6*  HGB 11.4*   < > 9.2* 8.6* 6.0*  HCT 36.6   < > 28.4* 26.0* 17.9*  PLT 194  --   --   --  102*   < > = values in this interval not displayed.   BMET Recent Labs    08/27/22 0606 08/28/22 0159 08/29/22 0635  NA 139 141 139  K 5.1 4.3 3.4*  CL 108 114* 114*  CO2 26 23 17*  GLUCOSE 122* 99 117*  BUN 41* 29* 30*  CREATININE 1.01* 0.74 0.74  CALCIUM 9.8 7.9* 7.6*   LFT Recent Labs    08/29/22 0635  PROT 4.0*  ALBUMIN 2.3*  AST 24  ALT 14  ALKPHOS 26*  BILITOT 0.5   PT/INR Recent Labs    08/27/22 0606  LABPROT 13.8  INR 1.1    STUDIES: [place]  EKG SITE RITE  Result Date: 08/29/2022 If Site Rite image not attached, placement could not be confirmed due to current cardiac rhythm.  CT ANGIO GI BLEED  Result Date: 08/28/2022 CLINICAL DATA:  Duodenal ulcer, gastrointestinal hemorrhage, coffee-ground emesis EXAM: CTA ABDOMEN AND PELVIS WITHOUT AND WITH CONTRAST TECHNIQUE: Multidetector CT imaging of the abdomen and pelvis was performed using the standard protocol during bolus administration of intravenous contrast. Multiplanar reconstructed images and MIPs were obtained and reviewed to evaluate the vascular anatomy. RADIATION DOSE REDUCTION: This exam was performed according to the departmental dose-optimization program which includes automated exposure control, adjustment of the mA and/or kV according to patient size and/or use of iterative reconstruction technique. CONTRAST:  147m OMNIPAQUE IOHEXOL 350 MG/ML SOLN COMPARISON:  None Available. FINDINGS: VASCULAR Aorta: Normal caliber aorta without aneurysm, dissection, vasculitis or significant stenosis. Mild atherosclerotic calcification Celiac: Mild mass effect related to the median arcuate ligament without hemodynamically significant stenosis. Variant anatomy with the common hepatic artery giving rise to the gastroduodenal artery and left hepatic artery. Accessory left hepatic artery noted. No aneurysm or dissection. SMA: Widely patent. Replaced right hepatic artery. No aneurysm or dissection. Renals: Dual right and single left renal arteries. Superior most right renal artery demonstrates a high-grade (greater than 75%) stenosis at its origin. Additionally, the vessel demonstrates a beaded appearance within its mid segment in keeping with changes of fibromuscular dysplasia. Remaining renal arteries are widely patent and demonstrate normal vascular morphology. No aneurysm or dissection. IMA: Diminutive, but patent. Inflow: Patent without evidence of aneurysm, dissection, vasculitis or significant  stenosis. Proximal Outflow: Bilateral common femoral and visualized portions of the superficial and profunda femoral arteries are patent without evidence of aneurysm, dissection, vasculitis or significant stenosis. Veins: Unremarkable.  Review of the MIP images confirms the above findings. NON-VASCULAR Lower chest: Visualized lung bases are clear bilaterally. Pacemaker leads are seen within the right heart. Small hiatal hernia. Hepatobiliary: No focal liver abnormality is seen. Status post cholecystectomy. No biliary dilatation. Pancreas: 14 mm simple cyst noted within the body of the pancreas demonstrating no enhancement or internal architecture. The pancreas is otherwise unremarkable. Spleen: Unremarkable Adrenals/Urinary Tract: Bilateral benign adrenal adenomas measuring 13 mm in greatest dimension. The kidneys are normal in size and position. No hydronephrosis. No intrarenal or ureteral calculi. The bladder is unremarkable. Stomach/Bowel: There is active extravasation involving the second portion of the duodenum just above the ampulla. This is best seen on coronal image # 57, series 9. Surgical changes of right hemicolectomy are identified. The stomach, small bowel, and large bowel are otherwise unremarkable. No free intraperitoneal gas or fluid. Lymphatic: No pathologic adenopathy within the abdomen and pelvis. Reproductive: Status post hysterectomy. No adnexal masses. Other: No abdominal wall hernia Musculoskeletal: No acute bone abnormality. Osseous structures are age-appropriate. IMPRESSION: Active extravasation involving second portion of the duodenum. Fibromuscular dysplasia involving a superior accessory right renal artery. Superimposed hemodynamically significant stenosis this vessel origin 14 mm simple cyst within the body of the pancreas. No enhancing intrapancreatic mass identified. Benign bilateral adrenal adenoma. Aortic Atherosclerosis (ICD10-I70.0). These results were called by telephone at the  time of interpretation on 08/28/2022 at 9:43 pm to provider Sharion Settler, NP, who verbally acknowledged these results. Electronically Signed   By: Fidela Salisbury M.D.   On: 08/28/2022 21:44   DG Abd Portable 2V  Result Date: 08/27/2022 CLINICAL DATA:  Abdominal pain EXAM: PORTABLE ABDOMEN - 2 VIEW COMPARISON:  None Available. FINDINGS: Multiple gas-filled loops of large and small bowel. No abnormal distension. Gas in the rectum. No intraperitoneal free air. Degenerative change of the lumbar spine with dextroscoliosis. No pathologic calcifications. IMPRESSION: 1. Gas filled colon and small bowel without evidence obstruction. No pathologic dilatation. 2. No free air. Electronically Signed   By: Suzy Bouchard M.D.   On: 08/27/2022 14:03     PREVIOUS ENDOSCOPIES:            EGD September 1 EGD September 2   Impression / Plan:   88 year old female with refractory bleeding from duodenal ulcer, status post attempted endoscopic hemostasis x2 and IR coiling x1.  She has ongoing brisk hemorrhage with tachycardia and soft blood pressures.  She has been transferred to the ICU.  We will plan for a third attempt at endoscopic hemostasis.  General surgery has evaluated the patient, but both the patient and the patient's daughter both agree that given the patient's age and functional status, high risk interventions such as surgery would not be pursued. I spoke with the patient's daughter, and she indicated understanding of the patient's situation in agreement for further attempts at endoscopic hemostasis without plans for surgery if endoscopy is unsuccessful.  Duodenal ulcer with active hemorrhage - Repeat upper endoscopy in ICU, would appreciate ICU staff support with sedation and maintaining MAPs - Protonix drip - Agree with addition of octreotide drip to reduce splanchnic pressures - Transfusion per critical care team  Thanks   LOS: 1 day   Daryel November  08/29/2022, 9:51 AM

## 2022-08-30 ENCOUNTER — Encounter (HOSPITAL_COMMUNITY): Payer: Self-pay | Admitting: Gastroenterology

## 2022-08-30 DIAGNOSIS — D62 Acute posthemorrhagic anemia: Secondary | ICD-10-CM | POA: Diagnosis not present

## 2022-08-30 LAB — CBC WITH DIFFERENTIAL/PLATELET
Abs Immature Granulocytes: 0.06 10*3/uL (ref 0.00–0.07)
Abs Immature Granulocytes: 0.06 10*3/uL (ref 0.00–0.07)
Basophils Absolute: 0 10*3/uL (ref 0.0–0.1)
Basophils Absolute: 0 10*3/uL (ref 0.0–0.1)
Basophils Relative: 0 %
Basophils Relative: 0 %
Eosinophils Absolute: 0.1 10*3/uL (ref 0.0–0.5)
Eosinophils Absolute: 0.1 10*3/uL (ref 0.0–0.5)
Eosinophils Relative: 1 %
Eosinophils Relative: 1 %
HCT: 21.9 % — ABNORMAL LOW (ref 36.0–46.0)
HCT: 22.2 % — ABNORMAL LOW (ref 36.0–46.0)
Hemoglobin: 7.4 g/dL — ABNORMAL LOW (ref 12.0–15.0)
Hemoglobin: 7.4 g/dL — ABNORMAL LOW (ref 12.0–15.0)
Immature Granulocytes: 1 %
Immature Granulocytes: 1 %
Lymphocytes Relative: 18 %
Lymphocytes Relative: 15 %
Lymphs Abs: 1.7 10*3/uL (ref 0.7–4.0)
Lymphs Abs: 1.4 10*3/uL (ref 0.7–4.0)
MCH: 30.5 pg (ref 26.0–34.0)
MCH: 30.5 pg (ref 26.0–34.0)
MCHC: 33.8 g/dL (ref 30.0–36.0)
MCHC: 33.3 g/dL (ref 30.0–36.0)
MCV: 90.1 fL (ref 80.0–100.0)
MCV: 91.4 fL (ref 80.0–100.0)
Monocytes Absolute: 0.9 10*3/uL (ref 0.1–1.0)
Monocytes Absolute: 0.9 10*3/uL (ref 0.1–1.0)
Monocytes Relative: 9 %
Monocytes Relative: 9 %
Neutro Abs: 7 10*3/uL (ref 1.7–7.7)
Neutro Abs: 7.1 10*3/uL (ref 1.7–7.7)
Neutrophils Relative %: 71 %
Neutrophils Relative %: 74 %
Platelets: 101 10*3/uL — ABNORMAL LOW (ref 150–400)
Platelets: 114 10*3/uL — ABNORMAL LOW (ref 150–400)
RBC: 2.43 MIL/uL — ABNORMAL LOW (ref 3.87–5.11)
RBC: 2.43 MIL/uL — ABNORMAL LOW (ref 3.87–5.11)
RDW: 16.1 % — ABNORMAL HIGH (ref 11.5–15.5)
RDW: 16 % — ABNORMAL HIGH (ref 11.5–15.5)
WBC: 9.7 10*3/uL (ref 4.0–10.5)
WBC: 9.7 10*3/uL (ref 4.0–10.5)
nRBC: 0.5 % — ABNORMAL HIGH (ref 0.0–0.2)
nRBC: 0.3 % — ABNORMAL HIGH (ref 0.0–0.2)

## 2022-08-30 LAB — COMPREHENSIVE METABOLIC PANEL
ALT: 22 U/L (ref 0–44)
AST: 34 U/L (ref 15–41)
Albumin: 2.5 g/dL — ABNORMAL LOW (ref 3.5–5.0)
Alkaline Phosphatase: 32 U/L — ABNORMAL LOW (ref 38–126)
Anion gap: 5 (ref 5–15)
BUN: 10 mg/dL (ref 8–23)
CO2: 23 mmol/L (ref 22–32)
Calcium: 7.5 mg/dL — ABNORMAL LOW (ref 8.9–10.3)
Chloride: 109 mmol/L (ref 98–111)
Creatinine, Ser: 0.62 mg/dL (ref 0.44–1.00)
GFR, Estimated: >60 mL/min (ref >60)
Glucose, Bld: 111 mg/dL — ABNORMAL HIGH (ref 70–99)
Potassium: 4.3 mmol/L (ref 3.5–5.1)
Sodium: 137 mmol/L (ref 135–145)
Total Bilirubin: 0.7 mg/dL (ref 0.3–1.2)
Total Protein: 4.3 g/dL — ABNORMAL LOW (ref 6.5–8.1)

## 2022-08-30 LAB — BPAM PLATELET PHERESIS
Blood Product Expiration Date: 202309042359
ISSUE DATE / TIME: 202309031202
Unit Type and Rh: 6200

## 2022-08-30 LAB — GLUCOSE, CAPILLARY
Glucose-Capillary: 121 mg/dL — ABNORMAL HIGH (ref 70–99)
Glucose-Capillary: 102 mg/dL — ABNORMAL HIGH (ref 70–99)

## 2022-08-30 LAB — PREPARE PLATELET PHERESIS: Unit division: 0

## 2022-08-30 LAB — MAGNESIUM: Magnesium: 2.3 mg/dL (ref 1.7–2.4)

## 2022-08-30 MED ORDER — GABAPENTIN 400 MG PO CAPS
400.0000 mg | ORAL_CAPSULE | Freq: Two times a day (BID) | ORAL | Status: DC
  Administered 2022-08-30 – 2022-09-03 (×9): 400 mg via ORAL
  Filled 2022-08-30 (×9): qty 1

## 2022-08-30 MED ORDER — SUCRALFATE 1 GM/10ML PO SUSP
1.0000 g | Freq: Four times a day (QID) | ORAL | Status: DC
  Administered 2022-08-30 – 2022-09-03 (×18): 1 g via ORAL
  Filled 2022-08-30 (×21): qty 10

## 2022-08-30 MED ORDER — MEMANTINE HCL 10 MG PO TABS
10.0000 mg | ORAL_TABLET | Freq: Two times a day (BID) | ORAL | Status: DC
  Administered 2022-08-30 – 2022-09-03 (×9): 10 mg via ORAL
  Filled 2022-08-30 (×10): qty 1

## 2022-08-30 MED ORDER — VENLAFAXINE HCL ER 75 MG PO CP24
150.0000 mg | ORAL_CAPSULE | Freq: Every day | ORAL | Status: DC
  Administered 2022-08-30 – 2022-09-03 (×5): 150 mg via ORAL
  Filled 2022-08-30: qty 1
  Filled 2022-08-30: qty 2
  Filled 2022-08-30 (×4): qty 1

## 2022-08-30 MED ORDER — ARIPIPRAZOLE 2 MG PO TABS
2.0000 mg | ORAL_TABLET | Freq: Every day | ORAL | Status: DC
  Administered 2022-08-30 – 2022-09-03 (×5): 2 mg via ORAL
  Filled 2022-08-30 (×5): qty 1

## 2022-08-30 MED ORDER — BUSPIRONE HCL 5 MG PO TABS
10.0000 mg | ORAL_TABLET | Freq: Two times a day (BID) | ORAL | Status: DC
  Administered 2022-08-30 – 2022-09-03 (×9): 10 mg via ORAL
  Filled 2022-08-30 (×8): qty 1
  Filled 2022-08-30 (×2): qty 2

## 2022-08-30 MED ORDER — PRIMIDONE 50 MG PO TABS
100.0000 mg | ORAL_TABLET | Freq: Every day | ORAL | Status: DC
  Administered 2022-08-30 – 2022-09-03 (×5): 100 mg via ORAL
  Filled 2022-08-30 (×5): qty 2

## 2022-08-30 MED ORDER — FUROSEMIDE 10 MG/ML IJ SOLN
40.0000 mg | Freq: Four times a day (QID) | INTRAMUSCULAR | Status: AC
  Administered 2022-08-30 (×2): 40 mg via INTRAVENOUS
  Filled 2022-08-30 (×2): qty 4

## 2022-08-30 MED ORDER — BUSPIRONE HCL 10 MG PO TABS
10.0000 mg | ORAL_TABLET | Freq: Once | ORAL | Status: AC
  Administered 2022-08-30: 10 mg via ORAL
  Filled 2022-08-30: qty 1

## 2022-08-30 MED ORDER — LORATADINE 10 MG PO TABS
10.0000 mg | ORAL_TABLET | Freq: Every day | ORAL | Status: DC
  Administered 2022-08-30 – 2022-09-03 (×5): 10 mg via ORAL
  Filled 2022-08-30 (×5): qty 1

## 2022-08-30 NOTE — Progress Notes (Signed)
PT Cancellation Note  Patient Details Name: Regina Hernandez MRN: [id number] DOB: 1934-09-05   Cancelled Treatment:    Reason Eval/Treat Not Completed: Patient declined, no reason specified.  Pt asked to wait until tomorrow, just back in bed from up a good part of the day in the chair. 08/30/2022  Ginger Carne., PT Acute Rehabilitation Services [phone]  (pager) [phone]  (office)   Tessie Fass Alura Olveda 08/30/2022, 4:41 PM

## 2022-08-30 NOTE — Evaluation (Signed)
Occupational Therapy Evaluation Patient Details Name: Regina Hernandez MRN: [id number] DOB: 11-12-1934 Today's Date: 08/30/2022   History of Present Illness Pt is an 88 year old woman admitted to [facility] on 9/1 with GIB, anemia and hemmorrhagic shock.Transferred to [facility] for embolization on 08/29/22. PMH: GIB, migraines, occipital neuralgia, essential tremor, cervical dystonia, SSS s/p pacemaker, depression, mild cognitive impairment.   Clinical Impression   Pt lives with her 2 daughters who can provide 24 hour care. She ambulates without a device in the house and uses a cane outside of her home. She is independent in self care and can prepare simple meals and complete light housekeeping. Pt was eager to get OOB this date. No physical assistance was needed for bed mobility or to stand and step-pivot from bed to chair. She needs set up to max assist (to don socks) for simulated ADLs. VSS throughout, she denied dizziness with OOB. She is likely to progress well for return home with her supportive family.     Recommendations for follow up therapy are one component of a multi-disciplinary discharge planning process, led by the attending physician.  Recommendations may be updated based on patient status, additional functional criteria and insurance authorization.   Follow Up Recommendations  Home health OT    Assistance Recommended at Discharge Frequent or constant Supervision/Assistance  Patient can return home with the following A little help with walking and/or transfers;A little help with bathing/dressing/bathroom;Assistance with cooking/housework;Assist for transportation;Help with stairs or ramp for entrance    Functional Status Assessment  Patient has had a recent decline in their functional status and demonstrates the ability to make significant improvements in function in a reasonable and predictable amount of time.  Equipment Recommendations  None recommended by OT    Recommendations for Other  Services       Precautions / Restrictions Precautions Precautions: Fall Restrictions Weight Bearing Restrictions: No      Mobility Bed Mobility Overal bed mobility: Needs Assistance Bed Mobility: Supine to Sit     Supine to sit: Supervision          Transfers Overall transfer level: Needs assistance Equipment used: None Transfers: Sit to/from Stand, Bed to chair/wheelchair/BSC Sit to Stand: Min guard     Step pivot transfers: Min guard            Balance Overall balance assessment: Needs assistance   Sitting balance-Leahy Scale: Fair       Standing balance-Leahy Scale: Fair                             ADL either performed or assessed with clinical judgement   ADL Overall ADL's : Needs assistance/impaired Eating/Feeding: NPO   Grooming: Set up;Cueing for UE precautions   Upper Body Bathing: Minimal assistance;Sitting   Lower Body Bathing: Sit to/from stand;Moderate assistance   Upper Body Dressing : Set up;Sitting   Lower Body Dressing: Maximal assistance;Bed level Lower Body Dressing Details (indicate cue type and reason): socks Toilet Transfer: Min Designer, jewellery Details (indicate cue type and reason): simulated to chair Toileting- Clothing Manipulation and Hygiene: Min guard;Sit to/from stand               Vision Baseline Vision/History: 1 Wears glasses Ability to See in Adequate Light: 0 Adequate Patient Visual Report: No change from baseline       Perception     Praxis      Pertinent Vitals/Pain Pain Assessment Pain Assessment: No/denies  pain     Hand Dominance Right   Extremity/Trunk Assessment Upper Extremity Assessment Upper Extremity Assessment: Generalized weakness (edematous)   Lower Extremity Assessment Lower Extremity Assessment: Defer to PT evaluation       Communication Communication Communication: No difficulties   Cognition Arousal/Alertness: Awake/alert Behavior During  Therapy: WFL for tasks assessed/performed Overall Cognitive Status: Within Functional Limits for tasks assessed                                 General Comments: eager to get OOB and to return home     General Comments       Exercises     Shoulder Instructions      Home Living Family/patient expects to be discharged to:: Private residence Living Arrangements: Children Available Help at Discharge: Family;Available 24 hours/day Type of Home: House Home Access: Ramped entrance     Home Layout: One level     Bathroom Shower/Tub: Occupational psychologist: Handicapped height     Home Equipment: Conservation officer, nature (2 wheels);Rollator (4 wheels);Cane - single point;Grab bars - tub/shower;Shower seat          Prior Functioning/Environment Prior Level of Function : Needs assist             Mobility Comments: walks with a cane outdoors, no device inside ADLs Comments: sits and stands to shower, prepares simple meals, does laundry, waters plants, likes to color        OT Problem List: Decreased strength      OT Treatment/Interventions: Self-care/ADL training;Therapeutic activities;Balance training;Patient/family education    OT Goals(Current goals can be found in the care plan section) Acute Rehab OT Goals OT Goal Formulation: With patient Time For Goal Achievement: 09/13/22 Potential to Achieve Goals: Good ADL Goals Pt Will Perform Grooming: with supervision;standing Pt Will Perform Lower Body Bathing: with supervision;sit to/from stand Pt Will Perform Lower Body Dressing: with supervision;sit to/from stand Pt Will Transfer to Toilet: with supervision;ambulating Pt Will Perform Toileting - Clothing Manipulation and hygiene: with supervision;sit to/from stand Additional ADL Goal #1: Pt will gather ADLs items from around her room with supervision.  OT Frequency: Min 2X/week    Co-evaluation              AM-PAC OT "6 Clicks" Daily  Activity     Outcome Measure Help from another person eating meals?: None Help from another person taking care of personal grooming?: A Little Help from another person toileting, which includes using toliet, bedpan, or urinal?: A Little Help from another person bathing (including washing, rinsing, drying)?: A Lot Help from another person to put on and taking off regular upper body clothing?: A Little Help from another person to put on and taking off regular lower body clothing?: A Lot 6 Click Score: 17   End of Session Nurse Communication: Mobility status  Activity Tolerance: Patient tolerated treatment well Patient left: in chair;with call bell/phone within reach;with family/visitor present  OT Visit Diagnosis: Muscle weakness (generalized) (M62.81)                Time: 0350-0938 OT Time Calculation (min): 27 min Charges:  OT General Charges $OT Visit: 1 Visit OT Evaluation $OT Eval Moderate Complexity: 1 Mod OT Treatments $Therapeutic Activity: 8-22 mins  Cleta Alberts, OTR/L Acute Rehabilitation Services Office: [phone]   Malka So 08/30/2022, 12:15 PM

## 2022-08-30 NOTE — Progress Notes (Signed)
Regina Hernandez   Subjective: Patient feels much better today.  Sitting up in bedside chair.  Denies abdominal pain, nausea.  Not hungry.  No passage of blood per rectum since yesterday morning.  Blood pressure normal, heart rate has normalized.   Objective: Vital signs in last 24 hours: Temp:  [97.5 F (36.4 C)-99.7 F (37.6 C)] 99 F (37.2 C) (09/04 1138) Pulse Rate:  [84-139] 84 (09/04 1200) Resp:  [13-32] 19 (09/04 1200) BP: (110-142)/(68-114) 127/79 (09/04 1200) SpO2:  [78 %-100 %] 99 % (09/04 1200) Last BM Date : 08/30/22 General: NAD, frail elderly Caucasian female, sitting in bed, accompanied by daughter Lungs:  CTA b/l, no w/r/r Heart:  RRR, no m/r/g Abdomen:  Soft, NT, ND, +BS Ext: Bilateral upper and lower extremity edema, extensive bruising    Intake/Output from previous day: 09/03 0701 - 09/04 0700 In: 2633.6 [I.V.:1402.8; Blood:898.3; IV Piggyback:332.6] Out: 1050 [Urine:1050] Intake/Output this shift: Total I/O In: 182.1 [I.V.:114.8; IV Piggyback:67.3] Out: -    Lab Results: Recent Labs    08/29/22 0635 08/29/22 1120 08/29/22 1708 08/30/22 0417  WBC 11.6*  --  10.9* 9.7  HGB 6.0* 8.7* 8.0* 7.4*  PLT 102*  --  107* 101*  MCV 92.7  --  88.8 90.1   BMET Recent Labs    08/29/22 0635 08/29/22 2150 08/30/22 0911  NA 139 138 137  K 3.4* 2.7* 4.3  CL 114* 110 109  CO2 17* 23 23  GLUCOSE 117* 118* 111*  BUN 30* 15 10  CREATININE 0.74 0.76 0.62  CALCIUM 7.6* 7.9* 7.5*   LFT Recent Labs    08/29/22 0635 08/30/22 0911  PROT 4.0* 4.3*  ALBUMIN 2.3* 2.5*  AST 24 34  ALT 14 22  ALKPHOS 26* 32*  BILITOT 0.5 0.7   PT/INR No results for input(s): "INR" in the last 72 hours.    Imaging/Other results: [place] EKG SITE RITE  Result Date: 08/29/2022 If Site Rite image not attached, placement could not be confirmed due to current cardiac rhythm.  CT ANGIO GI BLEED  Result Date: 08/28/2022 CLINICAL DATA:  Duodenal ulcer,  gastrointestinal hemorrhage, coffee-ground emesis EXAM: CTA ABDOMEN AND PELVIS WITHOUT AND WITH CONTRAST TECHNIQUE: Multidetector CT imaging of the abdomen and pelvis was performed using the standard protocol during bolus administration of intravenous contrast. Multiplanar reconstructed images and MIPs were obtained and reviewed to evaluate the vascular anatomy. RADIATION DOSE REDUCTION: This exam was performed according to the departmental dose-optimization program which includes automated exposure control, adjustment of the mA and/or kV according to patient size and/or use of iterative reconstruction technique. CONTRAST:  130m OMNIPAQUE IOHEXOL 350 MG/ML SOLN COMPARISON:  None Available. FINDINGS: VASCULAR Aorta: Normal caliber aorta without aneurysm, dissection, vasculitis or significant stenosis. Mild atherosclerotic calcification Celiac: Mild mass effect related to the median arcuate ligament without hemodynamically significant stenosis. Variant anatomy with the common hepatic artery giving rise to the gastroduodenal artery and left hepatic artery. Accessory left hepatic artery noted. No aneurysm or dissection. SMA: Widely patent. Replaced right hepatic artery. No aneurysm or dissection. Renals: Dual right and single left renal arteries. Superior most right renal artery demonstrates a high-grade (greater than 75%) stenosis at its origin. Additionally, the vessel demonstrates a beaded appearance within its mid segment in keeping with changes of fibromuscular dysplasia. Remaining renal arteries are widely patent and demonstrate normal vascular morphology. No aneurysm or dissection. IMA: Diminutive, but patent. Inflow: Patent without evidence of aneurysm, dissection, vasculitis or significant stenosis. Proximal Outflow:  Bilateral common femoral and visualized portions of the superficial and profunda femoral arteries are patent without evidence of aneurysm, dissection, vasculitis or significant stenosis. Veins:  Unremarkable. Review of the MIP images confirms the above findings. NON-VASCULAR Lower chest: Visualized lung bases are clear bilaterally. Pacemaker leads are seen within the right heart. Small hiatal hernia. Hepatobiliary: No focal liver abnormality is seen. Status post cholecystectomy. No biliary dilatation. Pancreas: 14 mm simple cyst noted within the body of the pancreas demonstrating no enhancement or internal architecture. The pancreas is otherwise unremarkable. Spleen: Unremarkable Adrenals/Urinary Tract: Bilateral benign adrenal adenomas measuring 13 mm in greatest dimension. The kidneys are normal in size and position. No hydronephrosis. No intrarenal or ureteral calculi. The bladder is unremarkable. Stomach/Bowel: There is active extravasation involving the second portion of the duodenum just above the ampulla. This is best seen on coronal image # 57, series 9. Surgical changes of right hemicolectomy are identified. The stomach, small bowel, and large bowel are otherwise unremarkable. No free intraperitoneal gas or fluid. Lymphatic: No pathologic adenopathy within the abdomen and pelvis. Reproductive: Status post hysterectomy. No adnexal masses. Other: No abdominal wall hernia Musculoskeletal: No acute bone abnormality. Osseous structures are age-appropriate. IMPRESSION: Active extravasation involving second portion of the duodenum. Fibromuscular dysplasia involving a superior accessory right renal artery. Superimposed hemodynamically significant stenosis this vessel origin 14 mm simple cyst within the body of the pancreas. No enhancing intrapancreatic mass identified. Benign bilateral adrenal adenoma. Aortic Atherosclerosis (ICD10-I70.0). These results were called by telephone at the time of interpretation on 08/28/2022 at 9:43 pm to provider Sharion Settler, NP, who verbally acknowledged these results. Electronically Signed   By: Fidela Salisbury M.D.   On: 08/28/2022 21:44      Assessment and  Plan:  88 year old female with history of sick sinus syndrome status post pacemaker, initially admitted to [facility] with bleeding duodenal ulcer.  She underwent EGD 9/1 by Dr. Alice Reichert, with epinephrine injection and cauterization of the ulcer with hemostasis, but then experienced rebleeding and underwent a second EGD 9/2 again with cauterization and hemostasis.  However, the blood again the evening of 9/2 and she was transferred to [facility] where a CTA revealed bleeding from the second portion of the duodenum.  She underwent IR embolization of the GDA the evening of 9/2, but then experienced rebleeding the morning of 9/3 with maroon-colored stools and hypotension and A-fib with RVR. She was transferred to the ICU that morning, and a third upper endoscopy later that day revealed a large ulcer, with visible vessel and small adherent fresh clot.  Because the bleeding had stopped spontaneously, and she had failed 2 prior attempts at cauterization, no hemostasis efforts were made.  Duodenal ulcer with hemorrhagic shock status post EGD x3 and IR embolization - Currently not bleeding, but still remains at risk for rebleed - Would keep patient n.p.o. except ice chips today - Continue Protonix drip - Continue octreotide drip - Continue Carafate suspension 4 times daily - Plan for transfer out of ICU tomorrow and advance diet to liquids if still no evidence of bleeding  Dr. Silverio Decamp will be taking over inpatient GI responsibilities tomorrow    Daryel November, MD  08/30/2022, 1:01 PM [facility]

## 2022-08-30 NOTE — Evaluation (Signed)
Physical Therapy Evaluation Patient Details Name: Regina Hernandez MRN: [id number] DOB: 12-15-1934 Today's Date: 08/30/2022  History of Present Illness  Pt is an 88 year old woman admitted to [facility] on 9/1 with GIB, anemia and hemmorrhagic shock.Transferred to [facility] for embolization on 08/29/22. PMH: GIB, migraines, occipital neuralgia, essential tremor, cervical dystonia, SSS s/p pacemaker, depression, mild cognitive impairment.  Clinical Impression  Pt admitted with/for ABLA s/p embolization.  Pt not quite at baseline but improving at min guard level. .  Pt currently limited functionally due to the problems listed below.  (see problems list.)  Pt will benefit from PT to maximize function and safety to be able to get home safely with available assist.        Recommendations for follow up therapy are one component of a multi-disciplinary discharge planning process, led by the attending physician.  Recommendations may be updated based on patient status, additional functional criteria and insurance authorization.  Follow Up Recommendations Home health PT      Assistance Recommended at Discharge PRN  Patient can return home with the following  A little help with bathing/dressing/bathroom;Assistance with cooking/housework;Assist for transportation    Equipment Recommendations None recommended by PT  Recommendations for Other Services       Functional Status Assessment Patient has had a recent decline in their functional status and demonstrates the ability to make significant improvements in function in a reasonable and predictable amount of time.     Precautions / Restrictions Precautions Precautions: Fall      Mobility  Bed Mobility Overal bed mobility: Needs Assistance Bed Mobility: Supine to Sit     Supine to sit: Supervision          Transfers Overall transfer level: Needs assistance Equipment used: None Transfers: Sit to/from Stand, Bed to chair/wheelchair/BSC Sit to Stand:  Min guard   Step pivot transfers: Min guard            Ambulation/Gait Ambulation/Gait assistance: Min guard Gait Distance (Feet): 100 Feet Assistive device: Rolling walker (2 wheels) Gait Pattern/deviations: Step-through pattern   Gait velocity interpretation: 1.31 - 2.62 ft/sec, indicative of limited community ambulator   General Gait Details: generally steady, but a little slow and guarded.  Stairs            Wheelchair Mobility    Modified Rankin (Stroke Patients Only)       Balance Overall balance assessment: Needs assistance   Sitting balance-Leahy Scale: Fair       Standing balance-Leahy Scale: Fair                               Pertinent Vitals/Pain Pain Assessment Pain Assessment: No/denies pain    Home Living Family/patient expects to be discharged to:: Private residence Living Arrangements: Children Available Help at Discharge: Family;Available 24 hours/day Type of Home: House Home Access: Ramped entrance       Home Layout: One level Home Equipment: Conservation officer, nature (2 wheels);Rollator (4 wheels);Cane - single point;Grab bars - tub/shower;Shower seat      Prior Function Prior Level of Function : Needs assist             Mobility Comments: walks with a cane outdoors, no device inside ADLs Comments: sits and stands to shower, prepares simple meals, does laundry, waters plants, likes to color     Hand Dominance   Dominant Hand: Right    Extremity/Trunk Assessment   Upper Extremity Assessment Upper  Extremity Assessment: Generalized weakness;Overall [facility] for tasks assessed    Lower Extremity Assessment Lower Extremity Assessment: Generalized weakness;Overall WFL for tasks assessed       Communication   Communication: No difficulties  Cognition Arousal/Alertness: Awake/alert Behavior During Therapy: WFL for tasks assessed/performed Overall Cognitive Status: Within Functional Limits for tasks assessed                                  General Comments: eager to get OOB and to return home        General Comments General comments (skin integrity, edema, etc.): vss    Exercises     Assessment/Plan    PT Assessment Patient needs continued PT services  PT Problem List Decreased strength;Decreased activity tolerance;Decreased mobility       PT Treatment Interventions Gait training;Functional mobility training;Therapeutic activities;Patient/family education    PT Goals (Current goals can be found in the Care Plan section)  Acute Rehab PT Goals Patient Stated Goal: home independent PT Goal Formulation: With patient Time For Goal Achievement: 09/13/22 Potential to Achieve Goals: Good    Frequency Min 3X/week     Co-evaluation               AM-PAC PT "6 Clicks" Mobility  Outcome Measure Help needed turning from your back to your side while in a flat bed without using bedrails?: None Help needed moving from lying on your back to sitting on the side of a flat bed without using bedrails?: None Help needed moving to and from a bed to a chair (including a wheelchair)?: A Little Help needed standing up from a chair using your arms (e.g., wheelchair or bedside chair)?: A Little Help needed to walk in hospital room?: A Little Help needed climbing 3-5 steps with a railing? : A Little 6 Click Score: 20    End of Session     Patient left: in bed;with call bell/phone within reach Nurse Communication: Mobility status PT Visit Diagnosis: Other abnormalities of gait and mobility (R26.89)    Time: 6294-7654 PT Time Calculation (min) (ACUTE ONLY): 20 min   Charges:   PT Evaluation $PT Eval Moderate Complexity: 1 Mod          08/30/2022  Ginger Carne., PT Acute Rehabilitation Services [phone]  (pager) [phone]  (office)  Tessie Fass Grier Czerwinski 08/30/2022, 5:04 PM

## 2022-08-30 NOTE — Progress Notes (Signed)
Referring Physician(s): Kumar,Pardeep  Supervising Physician: Michaelle Birks  Patient Status:  [facility] - In-pt  Chief Complaint:  GI Bleed 1 day s/p angiogram and embolization  Subjective:  Pt sitting up in bed states she is doing "great" and is having "no more bleeding" Daughter at bedside Denies pain or SOB Feels her feet are "puffy"   Allergies: Adhesive [tape], Baclofen, Lyrica [pregabalin], and Sulfa antibiotics  Medications: Prior to Admission medications   Medication Sig Start Date End Date Taking? Authorizing Provider  acetaminophen (TYLENOL) 500 MG tablet Take 1,000 mg by mouth 2 (two) times daily.   Yes [provider]  amLODipine (NORVASC) 2.5 MG tablet Take 2.5 mg by mouth daily.   Yes [provider]  ARIPiprazole (ABILIFY) 2 MG tablet Take 2 mg by mouth daily.   Yes [provider]  aspirin EC 81 MG tablet Take 81 mg by mouth daily. Swallow whole.   Yes [provider]  aspirin-acetaminophen-caffeine (EXCEDRIN MIGRAINE) 6475974338 MG tablet Take 1 tablet by mouth every 6 (six) hours as needed for headache.   Yes [provider]  atorvastatin (LIPITOR) 10 MG tablet Take 10 mg by mouth daily.   Yes [provider]  busPIRone (BUSPAR) 10 MG tablet Take 10 mg by mouth 2 (two) times daily.   Yes [provider]  cetirizine (ZYRTEC) 10 MG tablet Take 10 mg by mouth daily as needed for allergies.   Yes [provider]  Chlorphen-Phenyleph-Ibuprofen (ADVIL MULTI-SYMPTOM COLD & FLU) 4-10-200 MG TABS Take 1 tablet by mouth every 8 (eight) hours as needed (cold symptoms).   Yes [provider]  cholecalciferol (VITAMIN D3) 25 MCG (1000 UNIT) tablet Take 1,000 Units by mouth daily.   Yes [provider]  docusate sodium (COLACE) 100 MG capsule Take 100 mg by mouth daily as needed for mild constipation.   Yes [provider]  gabapentin (NEURONTIN) 400 MG capsule Take 400 mg by  mouth 2 (two) times daily.   Yes [provider]  memantine (NAMENDA) 10 MG tablet Take 10 mg by mouth 2 (two) times daily.   Yes [provider]  primidone (MYSOLINE) 50 MG tablet Take 100 mg by mouth daily.   Yes [provider]  venlafaxine XR (EFFEXOR XR) 150 MG 24 hr capsule Take 150 mg by mouth daily with breakfast.   Yes [provider]  vitamin B-12 (CYANOCOBALAMIN) 500 MCG tablet Take 500 mcg by mouth daily.   Yes [provider]     Vital Signs: BP 138/80 (BP Location: Left Arm)   Pulse 91   Temp (!) 97.5 F (36.4 C) (Oral)   Resp (!) 25   Ht '5\' 2"'$  (1.575 m)   Wt 165 lb 2 oz (74.9 kg)   SpO2 92%   BMI 30.20 kg/m   Physical Exam Constitutional:      General: She is not in acute distress.    Appearance: She is not toxic-appearing.  HENT:     Mouth/Throat:     Pharynx: Oropharynx is clear.  Eyes:     Extraocular Movements: Extraocular movements intact.  Cardiovascular:     Rate and Rhythm: Normal rate. Rhythm irregular.     Pulses: Normal pulses.  Pulmonary:     Comments: Noticeably shallow breaths, patient does not seem to be bothered, SpO2 92%, on O2 via [place] Musculoskeletal:     Right lower leg: Edema present.     Left lower leg: Edema present.  Skin:  Coloration: Skin is pale.     Comments: R CFA access site is soft, non-bleeding, and without pseudoaneurysm.  Distal pulse is strong  Neurological:     General: No focal deficit present.     Mental Status: She is alert.  Psychiatric:        Mood and Affect: Mood normal.        Behavior: Behavior normal.     Imaging: [place] EKG SITE RITE  Result Date: 08/29/2022 If Site Rite image not attached, placement could not be confirmed due to current cardiac rhythm.  CT ANGIO GI BLEED  Result Date: 08/28/2022 CLINICAL DATA:  Duodenal ulcer, gastrointestinal hemorrhage, coffee-ground emesis EXAM: CTA ABDOMEN AND PELVIS WITHOUT AND WITH CONTRAST TECHNIQUE: Multidetector CT  imaging of the abdomen and pelvis was performed using the standard protocol during bolus administration of intravenous contrast. Multiplanar reconstructed images and MIPs were obtained and reviewed to evaluate the vascular anatomy. RADIATION DOSE REDUCTION: This exam was performed according to the departmental dose-optimization program which includes automated exposure control, adjustment of the mA and/or kV according to patient size and/or use of iterative reconstruction technique. CONTRAST:  118m OMNIPAQUE IOHEXOL 350 MG/ML SOLN COMPARISON:  None Available. FINDINGS: VASCULAR Aorta: Normal caliber aorta without aneurysm, dissection, vasculitis or significant stenosis. Mild atherosclerotic calcification Celiac: Mild mass effect related to the median arcuate ligament without hemodynamically significant stenosis. Variant anatomy with the common hepatic artery giving rise to the gastroduodenal artery and left hepatic artery. Accessory left hepatic artery noted. No aneurysm or dissection. SMA: Widely patent. Replaced right hepatic artery. No aneurysm or dissection. Renals: Dual right and single left renal arteries. Superior most right renal artery demonstrates a high-grade (greater than 75%) stenosis at its origin. Additionally, the vessel demonstrates a beaded appearance within its mid segment in keeping with changes of fibromuscular dysplasia. Remaining renal arteries are widely patent and demonstrate normal vascular morphology. No aneurysm or dissection. IMA: Diminutive, but patent. Inflow: Patent without evidence of aneurysm, dissection, vasculitis or significant stenosis. Proximal Outflow: Bilateral common femoral and visualized portions of the superficial and profunda femoral arteries are patent without evidence of aneurysm, dissection, vasculitis or significant stenosis. Veins: Unremarkable. Review of the MIP images confirms the above findings. NON-VASCULAR Lower chest: Visualized lung bases are clear  bilaterally. Pacemaker leads are seen within the right heart. Small hiatal hernia. Hepatobiliary: No focal liver abnormality is seen. Status post cholecystectomy. No biliary dilatation. Pancreas: 14 mm simple cyst noted within the body of the pancreas demonstrating no enhancement or internal architecture. The pancreas is otherwise unremarkable. Spleen: Unremarkable Adrenals/Urinary Tract: Bilateral benign adrenal adenomas measuring 13 mm in greatest dimension. The kidneys are normal in size and position. No hydronephrosis. No intrarenal or ureteral calculi. The bladder is unremarkable. Stomach/Bowel: There is active extravasation involving the second portion of the duodenum just above the ampulla. This is best seen on coronal image # 57, series 9. Surgical changes of right hemicolectomy are identified. The stomach, small bowel, and large bowel are otherwise unremarkable. No free intraperitoneal gas or fluid. Lymphatic: No pathologic adenopathy within the abdomen and pelvis. Reproductive: Status post hysterectomy. No adnexal masses. Other: No abdominal wall hernia Musculoskeletal: No acute bone abnormality. Osseous structures are age-appropriate. IMPRESSION: Active extravasation involving second portion of the duodenum. Fibromuscular dysplasia involving a superior accessory right renal artery. Superimposed hemodynamically significant stenosis this vessel origin 14 mm simple cyst within the body of the pancreas. No enhancing intrapancreatic mass identified. Benign bilateral adrenal adenoma. Aortic Atherosclerosis (ICD10-I70.0). These results  were called by telephone at the time of interpretation on 08/28/2022 at 9:43 pm to provider Sharion Settler, NP, who verbally acknowledged these results. Electronically Signed   By: Fidela Salisbury M.D.   On: 08/28/2022 21:44   DG Abd Portable 2V  Result Date: 08/27/2022 CLINICAL DATA:  Abdominal pain EXAM: PORTABLE ABDOMEN - 2 VIEW COMPARISON:  None Available. FINDINGS: Multiple  gas-filled loops of large and small bowel. No abnormal distension. Gas in the rectum. No intraperitoneal free air. Degenerative change of the lumbar spine with dextroscoliosis. No pathologic calcifications. IMPRESSION: 1. Gas filled colon and small bowel without evidence obstruction. No pathologic dilatation. 2. No free air. Electronically Signed   By: Suzy Bouchard M.D.   On: 08/27/2022 14:03    Labs:  CBC: Recent Labs    08/27/22 0606 08/27/22 0932 08/29/22 0635 08/29/22 1120 08/29/22 1708 08/30/22 0417  WBC 11.9*  --  11.6*  --  10.9* 9.7  HGB 11.4*   < > 6.0* 8.7* 8.0* 7.4*  HCT 36.6   < > 17.9* 26.4* 23.1* 21.9*  PLT 194  --  102*  --  107* 101*   < > = values in this interval not displayed.    COAGS: Recent Labs    08/27/22 0606  INR 1.1  APTT 30    BMP: Recent Labs    08/27/22 0606 08/28/22 0159 08/29/22 0635 08/29/22 2150  NA 139 141 139 138  K 5.1 4.3 3.4* 2.7*  CL 108 114* 114* 110  CO2 26 23 17* 23  GLUCOSE 122* 99 117* 118*  BUN 41* 29* 30* 15  CALCIUM 9.8 7.9* 7.6* 7.9*  CREATININE 1.01* 0.74 0.74 0.76  GFRNONAA 54* >60 >60 >60    LIVER FUNCTION TESTS: Recent Labs    06/02/22 1921 08/27/22 0606 08/29/22 0635  BILITOT 0.5 1.0 0.5  AST '20 23 24  '$ ALT '13 13 14  '$ ALKPHOS 64 53 26*  PROT 7.5 6.9 4.0*  ALBUMIN 4.2 3.8 2.3*    Assessment and Plan:  GI Bleed --1 day s/p empiric coil embolization of GDA --bleeding seems to have stopped --Hgb 7.6 this am --access site without bleeding or pseudoaneurysm  Nothing further from IR at this time.  IR remains available as needed  Electronically Signed: Pasty Spillers, PA 08/30/2022, 10:09 AM   I spent a total of 15 Minutes at the the patient's bedside AND on the patient's hospital floor or unit, greater than 50% of which was counseling/coordinating care for post embolization follow up.

## 2022-08-30 NOTE — Progress Notes (Signed)
eLink Physician-Brief Progress Note Patient Name: Regina Hernandez DOB: 16-Jun-1934 MRN: [id number]   Date of Service  08/30/2022  HPI/Events of Note  Patient with depression and anxiety and takes multiple medications at home. Starting to get restless though not in distress at this time on camera. Ok to take medications with water per order in chart.   eICU Interventions  One time dose of her Buspirone added, will give this to help with her anxiety . Will defer to day team to re-introduce medications later today depending on how she does.      Intervention Category Minor Interventions: Agitation / anxiety - evaluation and management  Cuma Polyakov G Regan Mcbryar 08/30/2022, 3:22 AM

## 2022-08-30 NOTE — Progress Notes (Signed)
NAMESkyah Hernandez, MRN:  [id number], DOB:  1934/03/10, LOS: 2 ADMISSION DATE:  08/28/2022, CONSULTATION DATE:  9/3 REFERRING MD:  Candiss Norse, CHIEF COMPLAINT:  hemorrhagic shock   History of Present Illness:  88 year old woman with recurrent UGIB with signs of hemorrhagic shock. HB: 6, patient normotensive, but atrial fibrillation rate control worse.    Had presented to [facility] 9/1 with coffee ground emesis and periumbilical pain.  Two duodenal ulcers were found, both with visible bleeding vessels. Cautery was unsuccessful.  Transferred to [facility] for angiography - no active contrast extravasation seen.  Empiric coiling of GDA. Received 1 unit PRBC and started on PPI IV.   Pertinent  Medical History  GI bleed, migraines, occipital neuralgia, essential tremor, cervical dystonia, SSS s/p pacemaker   Significant Hospital Events: Including procedures, antibiotic start and stop dates in addition to other pertinent events   9/1 admitted to [facility] with active duodenal ulcer.  9/3 IR GDA embolization [facility] 9/3 concern for recurrent bleeding; EGD with nonbleeding duodenal ulcers   Interim History / Subjective:  Patient feeling well this morning, no further signs of bleeding. Patient eager to get out of bed today.   Objective   Blood pressure (!) 131/114, pulse 96, temperature 98.6 F (37 C), temperature source Oral, resp. rate (!) 32, height '5\' 2"'$  (1.575 m), weight 74.9 kg, SpO2 90 %.        Intake/Output Summary (Last 24 hours) at 08/30/2022 0634 Last data filed at 08/30/2022 0600 Gross per 24 hour  Intake 2633.63 ml  Output 1050 ml  Net 1583.63 ml   Filed Weights   08/29/22 0324 08/29/22 0930  Weight: 73.3 kg 74.9 kg    Examination: General: frail appearing elderly woman, no acute distress HENT: [place]/AT, eyes anicteric Lungs: CTAB, no wheezes or rhonchi Cardiovascular: regular rate, rhythm. +murmur Abdomen: soft, nontender, nondistended, +BS Extremities: warm, dry Neuro: alert and oriented x4,  no focal deficits GU: Foley in place  Labs reviewed> Hb 7.4, platelets 101,   Resolved Hospital Problem list     Assessment & Plan:  Critically ill due to suspected hemorrhagic shock from bleeding duodenal ulcers, S/P GDA embolization.  Acute blood loss anemia S/p 2u PRBC and 1u platelets on 9/3. Hb down to 7.4 this morning, platelets 101.  - Transfuse for Hb <7 - Maintain MAP>65; avoid crystalloids and overcorrecting hypotension as these may disrupt hemostatic plug.  - IV PPI gtt - IV Octreotide - Carafate 1 g q6h - Poor surgical candidate given co-morbidities. Per surgical resident's conversation with daughter, will transition to comfort care if apparent that will not survive without surgery. - CBC twice daily    Atrial fibrillation with rapid ventricular response  Normal LV function with aortic valve sclerosis per echo 04/23 Likely tachy-brady syndrome given PPM. HR and BP improved. Not on anticoagulation or rate controlling agents at home.  Frailty Moderate malnutrition - Institute enteral nutrition as soon as able  Hypokalemia K low at 2.7, repleted with Kcl 22mq x 8 doses - Recheck BMP  Anxiety Depression Mild cognitive impairment  On abilify 2 mg daily, buspar 10 mg bid, memantime 10 mg bid, primidone 100 mg daily, and venlafaxine 150 mg daily at home. - Continue home meds   Best Practice (right click and "Reselect all SmartList Selections" daily)   Diet/type: NPO DVT prophylaxis: not indicated GI prophylaxis: PPI Lines: R picc line Foley:  Yes, and it is still needed Code Status:  DNR Last date of multidisciplinary goals  of care discussion [Will update daughter 9/4]  Dispo: Remain in ICU  Labs   CBC: Recent Labs  Lab 08/27/22 0606 08/27/22 0932 08/28/22 1758 08/29/22 0635 08/29/22 1120 08/29/22 1708 08/30/22 0417  WBC 11.9*  --   --  11.6*  --  10.9* 9.7  NEUTROABS 10.2*  --   --   --   --   --  7.0  HGB 11.4*   < > 8.6* 6.0* 8.7* 8.0* 7.4*   HCT 36.6   < > 26.0* 17.9* 26.4* 23.1* 21.9*  MCV 94.1  --   --  92.7  --  88.8 90.1  PLT 194  --   --  102*  --  107* 101*   < > = values in this interval not displayed.    Basic Metabolic Panel: Recent Labs  Lab 08/27/22 0606 08/28/22 0159 08/29/22 0635 08/29/22 2150  NA 139 141 139 138  K 5.1 4.3 3.4* 2.7*  CL 108 114* 114* 110  CO2 26 23 17* 23  GLUCOSE 122* 99 117* 118*  BUN 41* 29* 30* 15  CREATININE 1.01* 0.74 0.74 0.76  CALCIUM 9.8 7.9* 7.6* 7.9*  MG  --   --  1.3* 1.4*   GFR: Estimated Creatinine Clearance: 47.8 mL/min (by C-G formula based on SCr of 0.76 mg/dL). Recent Labs  Lab 08/27/22 0606 08/29/22 0635 08/29/22 1708 08/30/22 0417  WBC 11.9* 11.6* 10.9* 9.7    Liver Function Tests: Recent Labs  Lab 08/27/22 0606 08/29/22 0635  AST 23 24  ALT 13 14  ALKPHOS 53 26*  BILITOT 1.0 0.5  PROT 6.9 4.0*  ALBUMIN 3.8 2.3*   Recent Labs  Lab 08/27/22 0606  LIPASE 31   No results for input(s): "AMMONIA" in the last 168 hours.  ABG No results found for: "PHART", "PCO2ART", "PO2ART", "HCO3", "TCO2", "ACIDBASEDEF", "O2SAT"   Coagulation Profile: Recent Labs  Lab 08/27/22 0606  INR 1.1    Cardiac Enzymes: No results for input(s): "CKTOTAL", "CKMB", "CKMBINDEX", "TROPONINI" in the last 168 hours.  HbA1C: No results found for: "HGBA1C"  CBG: Recent Labs  Lab 08/29/22 0635 08/29/22 0922 08/29/22 1809 08/29/22 2301  GLUCAP 110* 101* 109* 112*     Past Medical History:  She,  has a past medical history of Anxiety, Cervical dystonia, Essential tremor, GI bleed, Memory loss, Migraine, and Occipital neuralgia.   Surgical History:   Past Surgical History:  Procedure Laterality Date   BOWEL RESECTION     CARDIAC CATHETERIZATION  2016   no stents/[facility]   ESOPHAGOGASTRODUODENOSCOPY N/A 06/18/2020   Procedure: ESOPHAGOGASTRODUODENOSCOPY (EGD);  Surgeon: Toledo, Benay Pike, MD;  Location: [facility] ENDOSCOPY;  Service:  Gastroenterology;  Laterality: N/A;   PACEMAKER IMPLANT     REPLACEMENT TOTAL KNEE Left      Social History:   reports that she has never smoked. She has never been exposed to tobacco smoke. She has never used smokeless tobacco. She reports that she does not currently use alcohol. She reports that she does not use drugs.   Family History:  Her family history includes Other in her mother; Stomach cancer in her brother; Ulcers in her father.   Allergies Allergies  Allergen Reactions   Adhesive [Tape] Other (See Comments)    Contact dermatitis   Baclofen Rash   Lyrica [Pregabalin] Other (See Comments)    Unable to remember reaction.   Sulfa Antibiotics Rash     Home Medications  Prior to Admission medications  Medication Sig Start Date End Date Taking? Authorizing Provider  acetaminophen (TYLENOL) 500 MG tablet Take 1,000 mg by mouth 2 (two) times daily.   Yes [provider]  amLODipine (NORVASC) 2.5 MG tablet Take 2.5 mg by mouth daily.   Yes [provider]  ARIPiprazole (ABILIFY) 2 MG tablet Take 2 mg by mouth daily.   Yes [provider]  aspirin EC 81 MG tablet Take 81 mg by mouth daily. Swallow whole.   Yes [provider]  aspirin-acetaminophen-caffeine (EXCEDRIN MIGRAINE) [phone] MG tablet Take 1 tablet by mouth every 6 (six) hours as needed for headache.   Yes [provider]  atorvastatin (LIPITOR) 10 MG tablet Take 10 mg by mouth daily.   Yes [provider]  busPIRone (BUSPAR) 10 MG tablet Take 10 mg by mouth 2 (two) times daily.   Yes [provider]  cetirizine (ZYRTEC) 10 MG tablet Take 10 mg by mouth daily as needed for allergies.   Yes [provider]  Chlorphen-Phenyleph-Ibuprofen (ADVIL MULTI-SYMPTOM COLD & FLU) 4-10-200 MG TABS Take 1 tablet by mouth every 8 (eight) hours as needed (cold symptoms).   Yes [provider]  cholecalciferol (VITAMIN D3) 25 MCG (1000 UNIT) tablet Take  1,000 Units by mouth daily.   Yes [provider]  docusate sodium (COLACE) 100 MG capsule Take 100 mg by mouth daily as needed for mild constipation.   Yes [provider]  gabapentin (NEURONTIN) 400 MG capsule Take 400 mg by mouth 2 (two) times daily.   Yes [provider]  memantine (NAMENDA) 10 MG tablet Take 10 mg by mouth 2 (two) times daily.   Yes [provider]  primidone (MYSOLINE) 50 MG tablet Take 100 mg by mouth daily.   Yes [provider]  venlafaxine XR (EFFEXOR XR) 150 MG 24 hr capsule Take 150 mg by mouth daily with breakfast.   Yes [provider]  vitamin B-12 (CYANOCOBALAMIN) 500 MCG tablet Take 500 mcg by mouth daily.   Yes [provider]     Critical care time: 30 mins

## 2022-08-31 ENCOUNTER — Encounter: Payer: Self-pay | Admitting: Internal Medicine

## 2022-08-31 DIAGNOSIS — Z515 Encounter for palliative care: Secondary | ICD-10-CM

## 2022-08-31 DIAGNOSIS — R531 Weakness: Secondary | ICD-10-CM

## 2022-08-31 DIAGNOSIS — K264 Chronic or unspecified duodenal ulcer with hemorrhage: Secondary | ICD-10-CM | POA: Diagnosis not present

## 2022-08-31 DIAGNOSIS — Z66 Do not resuscitate: Secondary | ICD-10-CM

## 2022-08-31 DIAGNOSIS — D62 Acute posthemorrhagic anemia: Secondary | ICD-10-CM | POA: Diagnosis not present

## 2022-08-31 LAB — CBC WITH DIFFERENTIAL/PLATELET
Abs Immature Granulocytes: 0.05 10*3/uL (ref 0.00–0.07)
Abs Immature Granulocytes: 0.04 10*3/uL (ref 0.00–0.07)
Basophils Absolute: 0 10*3/uL (ref 0.0–0.1)
Basophils Absolute: 0 10*3/uL (ref 0.0–0.1)
Basophils Relative: 1 %
Basophils Relative: 0 %
Eosinophils Absolute: 0.2 10*3/uL (ref 0.0–0.5)
Eosinophils Absolute: 0.2 10*3/uL (ref 0.0–0.5)
Eosinophils Relative: 2 %
Eosinophils Relative: 2 %
HCT: 21.1 % — ABNORMAL LOW (ref 36.0–46.0)
HCT: 26.8 % — ABNORMAL LOW (ref 36.0–46.0)
Hemoglobin: 7.1 g/dL — ABNORMAL LOW (ref 12.0–15.0)
Hemoglobin: 9.1 g/dL — ABNORMAL LOW (ref 12.0–15.0)
Immature Granulocytes: 1 %
Immature Granulocytes: 0 %
Lymphocytes Relative: 16 %
Lymphocytes Relative: 18 %
Lymphs Abs: 1.4 10*3/uL (ref 0.7–4.0)
Lymphs Abs: 1.7 10*3/uL (ref 0.7–4.0)
MCH: 31.4 pg (ref 26.0–34.0)
MCH: 30.7 pg (ref 26.0–34.0)
MCHC: 33.6 g/dL (ref 30.0–36.0)
MCHC: 34 g/dL (ref 30.0–36.0)
MCV: 93.4 fL (ref 80.0–100.0)
MCV: 90.5 fL (ref 80.0–100.0)
Monocytes Absolute: 0.8 10*3/uL (ref 0.1–1.0)
Monocytes Absolute: 1 10*3/uL (ref 0.1–1.0)
Monocytes Relative: 9 %
Monocytes Relative: 10 %
Neutro Abs: 6.1 10*3/uL (ref 1.7–7.7)
Neutro Abs: 6.5 10*3/uL (ref 1.7–7.7)
Neutrophils Relative %: 71 %
Neutrophils Relative %: 70 %
Platelets: 112 10*3/uL — ABNORMAL LOW (ref 150–400)
Platelets: 133 10*3/uL — ABNORMAL LOW (ref 150–400)
RBC: 2.26 MIL/uL — ABNORMAL LOW (ref 3.87–5.11)
RBC: 2.96 MIL/uL — ABNORMAL LOW (ref 3.87–5.11)
RDW: 16.2 % — ABNORMAL HIGH (ref 11.5–15.5)
RDW: 16.4 % — ABNORMAL HIGH (ref 11.5–15.5)
WBC: 8.6 10*3/uL (ref 4.0–10.5)
WBC: 9.4 10*3/uL (ref 4.0–10.5)
nRBC: 0 % (ref 0.0–0.2)
nRBC: 0.3 % — ABNORMAL HIGH (ref 0.0–0.2)

## 2022-08-31 LAB — BASIC METABOLIC PANEL
Anion gap: 8 (ref 5–15)
BUN: 10 mg/dL (ref 8–23)
CO2: 29 mmol/L (ref 22–32)
Calcium: 8 mg/dL — ABNORMAL LOW (ref 8.9–10.3)
Chloride: 102 mmol/L (ref 98–111)
Creatinine, Ser: 0.72 mg/dL (ref 0.44–1.00)
GFR, Estimated: >60 mL/min (ref >60)
Glucose, Bld: 136 mg/dL — ABNORMAL HIGH (ref 70–99)
Potassium: 3.7 mmol/L (ref 3.5–5.1)
Sodium: 139 mmol/L (ref 135–145)

## 2022-08-31 LAB — COMPREHENSIVE METABOLIC PANEL
ALT: 22 U/L (ref 0–44)
AST: 25 U/L (ref 15–41)
Albumin: 2.4 g/dL — ABNORMAL LOW (ref 3.5–5.0)
Alkaline Phosphatase: 35 U/L — ABNORMAL LOW (ref 38–126)
Anion gap: 6 (ref 5–15)
BUN: 11 mg/dL (ref 8–23)
CO2: 28 mmol/L (ref 22–32)
Calcium: 7.6 mg/dL — ABNORMAL LOW (ref 8.9–10.3)
Chloride: 102 mmol/L (ref 98–111)
Creatinine, Ser: 0.69 mg/dL (ref 0.44–1.00)
GFR, Estimated: >60 mL/min (ref >60)
Glucose, Bld: 102 mg/dL — ABNORMAL HIGH (ref 70–99)
Potassium: 2.7 mmol/L — CL (ref 3.5–5.1)
Sodium: 136 mmol/L (ref 135–145)
Total Bilirubin: 0.7 mg/dL (ref 0.3–1.2)
Total Protein: 4.3 g/dL — ABNORMAL LOW (ref 6.5–8.1)

## 2022-08-31 LAB — MAGNESIUM: Magnesium: 1.7 mg/dL (ref 1.7–2.4)

## 2022-08-31 LAB — PREPARE RBC (CROSSMATCH)

## 2022-08-31 LAB — GLUCOSE, CAPILLARY: Glucose-Capillary: 101 mg/dL — ABNORMAL HIGH (ref 70–99)

## 2022-08-31 MED ORDER — SODIUM CHLORIDE 0.9% IV SOLUTION
Freq: Once | INTRAVENOUS | Status: AC
Start: 2022-08-31 — End: 2022-08-31
  Administered 2022-08-31: 10 mL/h via INTRAVENOUS

## 2022-08-31 MED ORDER — POTASSIUM CHLORIDE 10 MEQ/50ML IV SOLN: 10.0000 meq | INTRAVENOUS | Status: DC

## 2022-08-31 MED ORDER — POTASSIUM CHLORIDE 10 MEQ/50ML IV SOLN
10.0000 meq | INTRAVENOUS | Status: AC
  Administered 2022-08-31 (×4): 10 meq via INTRAVENOUS
  Filled 2022-08-31 (×4): qty 50

## 2022-08-31 MED ORDER — POTASSIUM CHLORIDE 20 MEQ PO PACK
40.0000 meq | PACK | Freq: Once | ORAL | Status: AC
Start: 2022-08-31 — End: 2022-08-31
  Administered 2022-08-31: 40 meq via ORAL
  Filled 2022-08-31: qty 2

## 2022-08-31 MED ORDER — FUROSEMIDE 10 MG/ML IJ SOLN
40.0000 mg | Freq: Two times a day (BID) | INTRAMUSCULAR | Status: AC
Start: 2022-08-31 — End: 2022-08-31
  Administered 2022-08-31 (×2): 40 mg via INTRAVENOUS
  Filled 2022-08-31 (×2): qty 4

## 2022-08-31 MED ORDER — POTASSIUM CHLORIDE 20 MEQ PO PACK
40.0000 meq | PACK | Freq: Once | ORAL | Status: AC
  Administered 2022-08-31: 40 meq
  Filled 2022-08-31: qty 2

## 2022-08-31 MED ORDER — MAGNESIUM SULFATE 2 GM/50ML IV SOLN
2.0000 g | Freq: Once | INTRAVENOUS | Status: AC
  Administered 2022-08-31: 2 g via INTRAVENOUS
  Filled 2022-08-31: qty 50

## 2022-08-31 NOTE — Progress Notes (Signed)
Referring Physician(s): Dr Charlsie Quest  Supervising Physician: Aletta Edouard  Patient Status:  [facility] - In-pt  Chief Complaint:  Upper GI Bleed  Subjective:  IR procedure 9/3: access via the RIGHT femoral artery. - variant splanchnic anatomy with replaced RHA from the SMA - senescent, tortuous abdominal aorta with stenosed celiac artery origin - no active extravasation noted at duodenum - successful empiric coil embolization of GDA by exclusion technique - AngioSeal closure at the R groin with distal RLE pulses at the end of the case.  Up in bed Doing well Had 2 BMs this am; maroon in color per notes Hg stable this am; no transfusion Rt groin NT no bleeding or hematoma     Allergies: Adhesive [tape], Baclofen, Lyrica [pregabalin], and Sulfa antibiotics  Medications: Prior to Admission medications   Medication Sig Start Date End Date Taking? Authorizing Provider  acetaminophen (TYLENOL) 500 MG tablet Take 1,000 mg by mouth 2 (two) times daily.   Yes [provider]  amLODipine (NORVASC) 2.5 MG tablet Take 2.5 mg by mouth daily.   Yes [provider]  ARIPiprazole (ABILIFY) 2 MG tablet Take 2 mg by mouth daily.   Yes [provider]  aspirin EC 81 MG tablet Take 81 mg by mouth daily. Swallow whole.   Yes [provider]  aspirin-acetaminophen-caffeine (EXCEDRIN MIGRAINE) [phone] MG tablet Take 1 tablet by mouth every 6 (six) hours as needed for headache.   Yes [provider]  atorvastatin (LIPITOR) 10 MG tablet Take 10 mg by mouth daily.   Yes [provider]  busPIRone (BUSPAR) 10 MG tablet Take 10 mg by mouth 2 (two) times daily.   Yes [provider]  cetirizine (ZYRTEC) 10 MG tablet Take 10 mg by mouth daily as needed for allergies.   Yes [provider]  Chlorphen-Phenyleph-Ibuprofen (ADVIL MULTI-SYMPTOM COLD & FLU) 4-10-200 MG TABS Take 1 tablet by mouth every 8 (eight) hours as needed (cold  symptoms).   Yes [provider]  cholecalciferol (VITAMIN D3) 25 MCG (1000 UNIT) tablet Take 1,000 Units by mouth daily.   Yes [provider]  docusate sodium (COLACE) 100 MG capsule Take 100 mg by mouth daily as needed for mild constipation.   Yes [provider]  gabapentin (NEURONTIN) 400 MG capsule Take 400 mg by mouth 2 (two) times daily.   Yes [provider]  memantine (NAMENDA) 10 MG tablet Take 10 mg by mouth 2 (two) times daily.   Yes [provider]  primidone (MYSOLINE) 50 MG tablet Take 100 mg by mouth daily.   Yes [provider]  venlafaxine XR (EFFEXOR XR) 150 MG 24 hr capsule Take 150 mg by mouth daily with breakfast.   Yes [provider]  vitamin B-12 (CYANOCOBALAMIN) 500 MCG tablet Take 500 mcg by mouth daily.   Yes [provider]     Vital Signs: BP 135/63   Pulse 69   Temp 99.3 F (37.4 C) (Oral)   Resp (!) 22   Ht '5\' 2"'$  (1.575 m)   Wt 165 lb 2 oz (74.9 kg)   SpO2 98%   BMI 30.20 kg/m   Physical Exam Vitals reviewed.  Skin:    General: Skin is warm.     Comments: Rt groin NT no bleeding No hematoma Rt foot good pulses   Neurological:     Mental Status: She is alert.     Imaging: [place] EKG SITE RITE  Result Date: 08/29/2022 If  Site Rite image not attached, placement could not be confirmed due to current cardiac rhythm.  CT ANGIO GI BLEED  Result Date: 08/28/2022 CLINICAL DATA:  Duodenal ulcer, gastrointestinal hemorrhage, coffee-ground emesis EXAM: CTA ABDOMEN AND PELVIS WITHOUT AND WITH CONTRAST TECHNIQUE: Multidetector CT imaging of the abdomen and pelvis was performed using the standard protocol during bolus administration of intravenous contrast. Multiplanar reconstructed images and MIPs were obtained and reviewed to evaluate the vascular anatomy. RADIATION DOSE REDUCTION: This exam was performed according to the departmental dose-optimization program which includes automated  exposure control, adjustment of the mA and/or kV according to patient size and/or use of iterative reconstruction technique. CONTRAST:  150m OMNIPAQUE IOHEXOL 350 MG/ML SOLN COMPARISON:  None Available. FINDINGS: VASCULAR Aorta: Normal caliber aorta without aneurysm, dissection, vasculitis or significant stenosis. Mild atherosclerotic calcification Celiac: Mild mass effect related to the median arcuate ligament without hemodynamically significant stenosis. Variant anatomy with the common hepatic artery giving rise to the gastroduodenal artery and left hepatic artery. Accessory left hepatic artery noted. No aneurysm or dissection. SMA: Widely patent. Replaced right hepatic artery. No aneurysm or dissection. Renals: Dual right and single left renal arteries. Superior most right renal artery demonstrates a high-grade (greater than 75%) stenosis at its origin. Additionally, the vessel demonstrates a beaded appearance within its mid segment in keeping with changes of fibromuscular dysplasia. Remaining renal arteries are widely patent and demonstrate normal vascular morphology. No aneurysm or dissection. IMA: Diminutive, but patent. Inflow: Patent without evidence of aneurysm, dissection, vasculitis or significant stenosis. Proximal Outflow: Bilateral common femoral and visualized portions of the superficial and profunda femoral arteries are patent without evidence of aneurysm, dissection, vasculitis or significant stenosis. Veins: Unremarkable. Review of the MIP images confirms the above findings. NON-VASCULAR Lower chest: Visualized lung bases are clear bilaterally. Pacemaker leads are seen within the right heart. Small hiatal hernia. Hepatobiliary: No focal liver abnormality is seen. Status post cholecystectomy. No biliary dilatation. Pancreas: 14 mm simple cyst noted within the body of the pancreas demonstrating no enhancement or internal architecture. The pancreas is otherwise unremarkable. Spleen: Unremarkable  Adrenals/Urinary Tract: Bilateral benign adrenal adenomas measuring 13 mm in greatest dimension. The kidneys are normal in size and position. No hydronephrosis. No intrarenal or ureteral calculi. The bladder is unremarkable. Stomach/Bowel: There is active extravasation involving the second portion of the duodenum just above the ampulla. This is best seen on coronal image # 57, series 9. Surgical changes of right hemicolectomy are identified. The stomach, small bowel, and large bowel are otherwise unremarkable. No free intraperitoneal gas or fluid. Lymphatic: No pathologic adenopathy within the abdomen and pelvis. Reproductive: Status post hysterectomy. No adnexal masses. Other: No abdominal wall hernia Musculoskeletal: No acute bone abnormality. Osseous structures are age-appropriate. IMPRESSION: Active extravasation involving second portion of the duodenum. Fibromuscular dysplasia involving a superior accessory right renal artery. Superimposed hemodynamically significant stenosis this vessel origin 14 mm simple cyst within the body of the pancreas. No enhancing intrapancreatic mass identified. Benign bilateral adrenal adenoma. Aortic Atherosclerosis (ICD10-I70.0). These results were called by telephone at the time of interpretation on 08/28/2022 at 9:43 pm to provider BSharion Settler NP, who verbally acknowledged these results. Electronically Signed   By: AFidela SalisburyM.D.   On: 08/28/2022 21:44   DG Abd Portable 2V  Result Date: 08/27/2022 CLINICAL DATA:  Abdominal pain EXAM: PORTABLE ABDOMEN - 2 VIEW COMPARISON:  None Available. FINDINGS: Multiple gas-filled loops of large and small bowel. No abnormal distension. Gas in the rectum. No intraperitoneal free  air. Degenerative change of the lumbar spine with dextroscoliosis. No pathologic calcifications. IMPRESSION: 1. Gas filled colon and small bowel without evidence obstruction. No pathologic dilatation. 2. No free air. Electronically Signed   By: Suzy Bouchard M.D.   On: 08/27/2022 14:03    Labs:  CBC: Recent Labs    08/29/22 1708 08/30/22 0417 08/30/22 1757 08/31/22 0615  WBC 10.9* 9.7 9.7 8.6  HGB 8.0* 7.4* 7.4* 7.1*  HCT 23.1* 21.9* 22.2* 21.1*  PLT 107* 101* 114* 112*    COAGS: Recent Labs    08/27/22 0606  INR 1.1  APTT 30    BMP: Recent Labs    08/29/22 0635 08/29/22 2150 08/30/22 0911 08/31/22 0615  NA 139 138 137 136  K 3.4* 2.7* 4.3 2.7*  CL 114* 110 109 102  CO2 17* '23 23 28  '$ GLUCOSE 117* 118* 111* 102*  BUN 30* '15 10 11  '$ CALCIUM 7.6* 7.9* 7.5* 7.6*  CREATININE 0.74 0.76 0.62 0.69  GFRNONAA >60 >60 >60 >60    LIVER FUNCTION TESTS: Recent Labs    08/27/22 0606 08/29/22 0635 08/30/22 0911 08/31/22 0615  BILITOT 1.0 0.5 0.7 0.7  AST 23 24 34 25  ALT '13 14 22 22  '$ ALKPHOS 53 26* 32* 35*  PROT 6.9 4.0* 4.3* 4.3*  ALBUMIN 3.8 2.3* 2.5* 2.4*    Assessment and Plan:  UGI bleed Empiric coiling of GDA 08/29/22 in IR Doing well and stable hg without transfusion Contact IR if any further needs   Electronically Signed: Lavonia Drafts, PA-C 08/31/2022, 10:53 AM   I spent a total of 15 Minutes at the the patient's bedside AND on the patient's hospital floor or unit, greater than 50% of which was counseling/coordinating care for Upper GI Bleed; embolization

## 2022-08-31 NOTE — Consult Note (Signed)
Consultation Note Date: 08/31/2022   Patient Name: Regina Hernandez  DOB: 02-10-1934  MRN: [id number]  Age / Sex: 88 y.o., female  PCP: Regina Noe, MD Referring Physician: Candee Furbish, MD  Reason for Consultation: Establishing goals of care and Psychosocial/spiritual support  HPI/Patient Profile: 88 y.o. female   admitted on 08/28/2022 with recurrent UGIB with signs of hemorrhagic shock. Hemoglobin 6   Had [facility] with coffee ground emesis and periumbilical pain.  Two duodenal ulcers were found, both with visible bleeding vessels. Cautery was unsuccessful.   Transferred to [facility] for angiography - no active contrast extravasation seen.  Empiric coiling of GDA. Received 1 unit PRBC and started on PPI IV.  Patient and family face ongoing treatment ,option decisions advanced directive decisions and anticipatory care needs.  Clinical Assessment and Goals of Care:  This NP Regina Hernandez reviewed medical records, received report from team, assessed the patient and then meet at the patient's bedside  to discuss diagnosis, prognosis, GOC, EOL wishes disposition and options.   Concept of Palliative Care was introduced as specialized medical care for people and their families living with serious illness.  If focuses on providing relief from the symptoms and stress of a serious illness.  The goal is to improve quality of life for both the patient and the family. Values and goals of care important to patient were attempted to be elicited.  Patient is alert and oriented, out of bed to the chair and pleasantly engages in conversation Created space and opportunity for patient  to explore thoughts and feelings regarding current medical situation.  Regina Hernandez understands her current medical situation specific to her GI bleed.  She hopes that interventions, "have done the trick" and that she will continue to  improve.     A  discussion was had today regarding advanced directives.  Concepts specific to code status, artifical feeding and hydration, continued IV antibiotics and rehospitalization was had.   Education offered on patient's choice to focus more on comfort and less on aggressive life-prolonging measures. Regina Hernandez verbalizes her DNR/DNI status and the fact that she is not interested in any kind of open surgery now or in the future.  I spoke to her daughter Regina Hernandez and reviewed the above conversation, She too verbalizes an understanding of her mother's current medical situation and long-term goals of care.       Questions and concerns addressed.  Patient  encouraged to call with questions or concerns.     PMT will continue to support holistically.          Regina Hernandez/daughter's main support person and decision maker in the event the patient cannot make decisions for herself.     SUMMARY OF RECOMMENDATIONS    Code Status/Advance Care Planning: DNR Treat the treatable, hope for improvement, open to short-term rehab and ultimately desires to return home   Additional Recommendations (Limitations, Scope, Preferences): No Surgical Procedures  Psycho-social/Spiritual:  Desire for further Chaplaincy support:yes- to help with ACP documents  Prognosis:  Patient and her daughter are hopeful for SNF for short-term rehab  Unable to determine  Discharge Planning     Primary Diagnoses: Present on Admission:  Acute blood loss anemia (ABLA)  GI bleed  Mild cognitive impairment with memory loss  Essential hypertension  (Resolved) ABLA (acute blood loss anemia)   I have reviewed the medical record, interviewed the patient and family, and examined the patient. The following aspects are pertinent.  Past Medical History:  Diagnosis Date   Anxiety    Cervical dystonia    Essential tremor    GI bleed    Memory loss    Migraine    Occipital neuralgia     Social History   Socioeconomic History   Marital status: Widowed    Spouse name: Not on file   Number of children: 5   Years of education: 12th   Highest education level: High school graduate  Occupational History   Occupation: Retired  Tobacco Use   Smoking status: Never    Passive exposure: Never   Smokeless tobacco: Never  Vaping Use   Vaping Use: Never used  Substance and Sexual Activity   Alcohol use: Not Currently   Drug use: Never   Sexual activity: Not Currently  Other Topics Concern   Not on file  Social History Narrative   Lives at home with her daughters.   Right-handed.   Two cups caffeine per day.      02/10/21   From: [place] - moved to [place] 2004   Living: with daughter Regina Hernandez and Regina Hernandez granddaughter and Regina Hernandez (daughter)   Work: retired - daycare       Family: Lives with daughters - Regina Hernandez and Regina Hernandez, 3 other children across the country - too many to count ~15, a few great-grandchildren      Enjoys: play cards, word search books, watch TV, relaxing outside      Exercise: not currently   Diet: good appetite, cauliflower, chicken, likes most things      Safety   Seat belts: Yes    Guns: No   Safe in relationships: Yes    Social Determinants of Radio broadcast assistant Strain: Not on file  Food Insecurity: Not on file  Transportation Needs: Not on file  Physical Activity: Not on file  Stress: Not on file  Social Connections: Not on file   Family History  Problem Relation Age of Onset   Other Mother        "old age"   Ulcers Father    Stomach cancer Brother    Scheduled Meds:  sodium chloride   Intravenous Once   ARIPiprazole  2 mg Oral Daily   busPIRone  10 mg Oral BID   Chlorhexidine Gluconate Cloth  6 each Topical Daily   furosemide  40 mg Intravenous BID   gabapentin  400 mg Oral BID   loratadine  10 mg Oral Daily   memantine  10 mg Oral BID   primidone  100 mg Oral Daily   sodium chloride flush  10-40 mL Intracatheter Q12H    sucralfate  1 g Oral Q6H   venlafaxine XR  150 mg Oral Q breakfast   Continuous Infusions:  sodium chloride Stopped (08/29/22 1639)   octreotide (SANDOSTATIN) 500 mcg in sodium chloride 0.9 % 250 mL (2 mcg/mL) infusion 50 mcg/hr (08/31/22 1400)   pantoprazole 8 mg/hr (08/31/22 1400)   PRN Meds:.acetaminophen **OR** acetaminophen, ondansetron **OR** ondansetron (ZOFRAN) IV, mouth rinse, sodium chloride  flush Medications Prior to Admission:  Prior to Admission medications   Medication Sig Start Date End Date Taking? Authorizing Provider  acetaminophen (TYLENOL) 500 MG tablet Take 1,000 mg by mouth 2 (two) times daily.   Yes [provider]  amLODipine (NORVASC) 2.5 MG tablet Take 2.5 mg by mouth daily.   Yes [provider]  ARIPiprazole (ABILIFY) 2 MG tablet Take 2 mg by mouth daily.   Yes [provider]  aspirin EC 81 MG tablet Take 81 mg by mouth daily. Swallow whole.   Yes [provider]  aspirin-acetaminophen-caffeine (EXCEDRIN MIGRAINE) [phone] MG tablet Take 1 tablet by mouth every 6 (six) hours as needed for headache.   Yes [provider]  atorvastatin (LIPITOR) 10 MG tablet Take 10 mg by mouth daily.   Yes [provider]  busPIRone (BUSPAR) 10 MG tablet Take 10 mg by mouth 2 (two) times daily.   Yes [provider]  cetirizine (ZYRTEC) 10 MG tablet Take 10 mg by mouth daily as needed for allergies.   Yes [provider]  Chlorphen-Phenyleph-Ibuprofen (ADVIL MULTI-SYMPTOM COLD & FLU) 4-10-200 MG TABS Take 1 tablet by mouth every 8 (eight) hours as needed (cold symptoms).   Yes [provider]  cholecalciferol (VITAMIN D3) 25 MCG (1000 UNIT) tablet Take 1,000 Units by mouth daily.   Yes [provider]  docusate sodium (COLACE) 100 MG capsule Take 100 mg by mouth daily as needed for mild constipation.   Yes [provider]  gabapentin (NEURONTIN) 400 MG capsule Take 400 mg by mouth 2  (two) times daily.   Yes [provider]  memantine (NAMENDA) 10 MG tablet Take 10 mg by mouth 2 (two) times daily.   Yes [provider]  primidone (MYSOLINE) 50 MG tablet Take 100 mg by mouth daily.   Yes [provider]  venlafaxine XR (EFFEXOR XR) 150 MG 24 hr capsule Take 150 mg by mouth daily with breakfast.   Yes [provider]  vitamin B-12 (CYANOCOBALAMIN) 500 MCG tablet Take 500 mcg by mouth daily.   Yes [provider]   Allergies  Allergen Reactions   Adhesive [Tape] Other (See Comments)    Contact dermatitis   Baclofen Rash   Lyrica [Pregabalin] Other (See Comments)    Unable to remember reaction.   Sulfa Antibiotics Rash   Review of Systems  Constitutional:  Positive for fatigue.    Physical Exam Cardiovascular:     Rate and Rhythm: Normal rate.  Abdominal:     Palpations: Abdomen is soft.  Skin:    General: Skin is warm and dry.  Neurological:     Mental Status: She is alert and oriented to person, place, and time.     Vital Signs: BP (!) 141/84   Pulse 71   Temp 98.3 F (36.8 C) (Oral)   Resp 17   Ht '5\' 2"'$  (1.575 m)   Wt 74.9 kg   SpO2 96%   BMI 30.20 kg/m  Pain Scale: 0-10   Pain Score: Asleep   SpO2: SpO2: 96 % O2 Device:SpO2: 96 % O2 Flow Rate: .O2 Flow Rate (L/min): 2 L/min  IO: Intake/output summary:  Intake/Output Summary (Last 24 hours) at 08/31/2022 1536 Last data filed at 08/31/2022 1400 Gross per 24 hour  Intake 2108.8 ml  Output 5526 ml  Net -3417.2 ml    LBM: Last BM Date : 08/31/22 Baseline Weight: Weight: 73.3 kg Most recent weight: Weight: 74.9 kg  Palliative Assessment/Data: 40 % at best   Discussed with Ines Bloomer RN and treatment team   Signed by: Regina Lessen, NP   Please contact Palliative Medicine Team phone at [phone] for questions and concerns.  For individual provider: See Shea Evans

## 2022-08-31 NOTE — Progress Notes (Addendum)
Daily Rounding Note  08/31/2022, 2:42 PM  LOS: 3 days   SUBJECTIVE:   Chief complaint:      Still passing blood as of this AM.  First stool this morning was loose, burgundy bloody.  The next stool was black.  The third and latest stool was black with some streaks of blood.  Vital signs stable.  Got her third unit of PRBC this morning.  VSS currently  stable.  PO at 11 AM had pressures as low as 94/81.  No tachycardia.  No fevers.  Excellent room air saturations.  Denies abdominal pain, nausea, vomiting.  Actually feels pretty good.  OBJECTIVE:         Vital signs in last 24 hours:    Temp:  [98.5 F (36.9 C)-99.3 F (37.4 C)] 98.8 F (37.1 C) (09/05 1245) Pulse Rate:  [65-91] 71 (09/05 1400) Resp:  [12-27] 17 (09/05 1400) BP: (94-157)/(63-97) 141/84 (09/05 1400) SpO2:  [92 %-100 %] 96 % (09/05 1400) Last BM Date : 08/31/22 Filed Weights   08/29/22 0324 08/29/22 0930  Weight: 73.3 kg 74.9 kg   General: Pleasant, comfortable.  Elderly but does not look acutely ill. Heart: RRR. Chest: Crackles at bases bilaterally but no labored breathing or cough Abdomen: Soft without tenderness.  No distention. Extremities: No CCE. Neuro/Psych: Fully alert and oriented.  Pleasant, appropriate.  Intake/Output from previous day: 09/04 0701 - 09/05 0700 In: 1048.9 [I.V.:881.6; IV Piggyback:167.3] Out: 4375 [Urine:4375]  Intake/Output this shift: Total I/O In: 1551 [P.O.:370; I.V.:278.1; Blood:653; IV Piggyback:249.9] Out: 1093 [Urine:1550; Stool:1]  Lab Results: Recent Labs    08/30/22 0417 08/30/22 1757 08/31/22 0615  WBC 9.7 9.7 8.6  HGB 7.4* 7.4* 7.1*  HCT 21.9* 22.2* 21.1*  PLT 101* 114* 112*   BMET Recent Labs    08/29/22 2150 08/30/22 0911 08/31/22 0615  NA 138 137 136  K 2.7* 4.3 2.7*  CL 110 109 102  CO2 '23 23 28  '$ GLUCOSE 118* 111* 102*  BUN '15 10 11  '$ CREATININE 0.76 0.62 0.69  CALCIUM 7.9* 7.5* 7.6*    LFT Recent Labs    08/29/22 0635 08/30/22 0911 08/31/22 0615  PROT 4.0* 4.3* 4.3*  ALBUMIN 2.3* 2.5* 2.4*  AST 24 34 25  ALT '14 22 22  '$ ALKPHOS 26* 32* 35*  BILITOT 0.5 0.7 0.7   PT/INR No results for input(s): "LABPROT", "INR" in the last 72 hours. Hepatitis Panel No results for input(s): "HEPBSAG", "HCVAB", "HEPAIGM", "HEPBIGM" in the last 72 hours.  Studies/Results: No results found.  Scheduled Meds:  sodium chloride   Intravenous Once   ARIPiprazole  2 mg Oral Daily   busPIRone  10 mg Oral BID   Chlorhexidine Gluconate Cloth  6 each Topical Daily   furosemide  40 mg Intravenous BID   gabapentin  400 mg Oral BID   loratadine  10 mg Oral Daily   memantine  10 mg Oral BID   primidone  100 mg Oral Daily   sodium chloride flush  10-40 mL Intracatheter Q12H   sucralfate  1 g Oral Q6H   venlafaxine XR  150 mg Oral Q breakfast   Continuous Infusions:  sodium chloride Stopped (08/29/22 1639)   octreotide (SANDOSTATIN) 500 mcg in sodium chloride 0.9 % 250 mL (2 mcg/mL) infusion 50 mcg/hr (08/31/22 1400)   pantoprazole 8 mg/hr (08/31/22 1400)   PRN Meds:.acetaminophen **OR** acetaminophen, ondansetron **OR** ondansetron (ZOFRAN) IV, mouth rinse, sodium chloride flush  ASSESMENT:     Bleeding DU.   08/27/2022 EGD at [facility] with cauterization and epinephrine injection of duodenal ulcer.   Repeat EGD (#2) 9/2 for rebleeding again cauterized and hemostasis achieved.   Bled again later on 9/2, transferred to [facility].   9/2 CTA with active bleeding at D2.   Underwent IR embolization of GDA evening of 9/2.   Rebled morning of 9 3 associated with A-fib/RVR.   EGD #3 on 9/3 revealed a large duodenal ulcer with VV and small amount of adherent fresh clot, no interventions performed Bleeding again this AM.  IR has seen the patient.  No plans for further intervention as she was hemodynamically stable with stable Hgb. Sandostatin gtt day 3 Protonix gtt day 3.  72-hour  drip finishes on 9/6 at 08 30.  Blood loss anemia.  Since transfer from [facility] has received 3 PRBCs.  Hgb 6.. 8.7.. 8..  7.4... 7.1 over last 72 hours.    Thrombocytopenia.  Noncritical.  Nadir level 102, 112 today.  Started out in 190s.    Hypokalemia.    Azotemia without AKI, BUN now normal.   PLAN     Does pt still need Sandostatin gtt?   Finish Protonix drip tomorrow and then convert to either twice daily IV or oral formulation.  Continue clear liquid diet for now.  She will have recheck of her CBC at 5 PM tonight and 5 AM in the morning.    Regina Hernandez  08/31/2022, 2:42 PM Phone [phone]    Attending physician's note   I have taken a history, reviewed the chart and examined the patient. I performed a substantive portion of this encounter, including complete performance of at least one of the key components, in conjunction with the APP. I agree with the APP's note, impression and recommendations.    Patient had 3 melenic appearing stool since this morning.  She is hemodynamically stable She could be passing residual blood but cannot exclude ongoing slow bleed from the duodenal ulcer The lesion is not amenable for repeat endoscopic therapy or intervention by IR If patient has recurrent large-volume hemorrhage with hemodynamic instability, will need surgical intervention Clear liquids Continue PPI gtt. Continue octreotide gtt. for 72 hours and then stop, can help with hemostasis even in non variceal GI hemorrhage  Monitor hemoglobin and transfuse if below 7, avoid over transfusion Replete electrolytes  GI is available if needed, please call with any questions   The patient was provided an opportunity to ask questions and all were answered. The patient agreed with the plan and demonstrated an understanding of the instructions.   Damaris Hippo , MD [phone]

## 2022-08-31 NOTE — Progress Notes (Signed)
[facility] ADULT ICU REPLACEMENT PROTOCOL   The patient does apply for the [facility] Adult ICU Electrolyte Replacment Protocol based on the criteria listed below:   1.Exclusion criteria: TCTS patients, ECMO patients, and Dialysis patients 2. Is GFR >/= 30 ml/min? Yes.    Patient's GFR today is >60 3. Is SCr </= 2? Yes.   Patient's SCr is 1.69 mg/dL 4. Did SCr increase >/= 0.5 in 24 hours? No. 5.Pt's weight >40kg  No. 6. Abnormal electrolyte(s): K 2.7, Mag 1.7  7. Electrolytes replaced per protocol 8.  Call MD STAT for K+ </= 2.5, Phos </= 1, or Mag </= 1 Physician:    Ronda Fairly A 08/31/2022 6:57 AM

## 2022-08-31 NOTE — TOC Progression Note (Signed)
Transition of Care [facility]) - Progression Note    Patient Details  Name: Regina Hernandez MRN: [id number] Date of Birth: Mar 16, 1934  Transition of Care [facility]) CM/SW [place], RN Phone [phone]  08/31/2022, 2:05 PM  Clinical Narrative:    [facility] consulted for Home health needs. CM at bedside to discuss home health referral. Patient states that she is open to home health but would like for CM to speak with her daughter . Patient states that she gets things mixed up sometime and she would prefer her daughter [name] to make the decisions. CM spoke with daughter Maurine Minister on phone. Daughter states that she is leaning towards her mother being placed in short term inpatient rehab facility. Daughter states that she has been looking at a few facilities ([place]) daughter is connecting with friends for the name of some other facilities that she may be interested in. Daughter made aware that currently recommendation is for Home health. Per daughter patient will need to walk down long halls in her home and may need a little more rehab before returning home. TOC will continue to follow.    Expected Discharge Plan: [place] Barriers to Discharge: Continued Medical Work up  Expected Discharge Plan and Services Expected Discharge Plan: [place] In-house Referral: NA Discharge Planning Services: CM Consult Post Acute Care Choice: [place] arrangements for the past 2 months: Single Family Home                 DME Arranged: N/A DME Agency: NA       HH Arranged: NA           Social Determinants of Health (SDOH) Interventions    Readmission Risk Interventions    08/31/2022    1:56 PM 07/24/2020    2:41 PM  Readmission Risk Prevention Plan  Transportation Screening Complete Complete  PCP or Specialist Appt within 5-7 Days Complete   Home Care Screening Complete   Medication Review (RN CM) Referral to Pharmacy    Palliative Care Screening  Not Applicable  Medication Review (RN Care Manager)  Complete

## 2022-08-31 NOTE — Progress Notes (Addendum)
NAMEAmiaya Hernandez, MRN:  [id number], DOB:  03-08-1934, LOS: 3 ADMISSION DATE:  08/28/2022, CONSULTATION DATE:  9/3 REFERRING MD:  Candiss Norse, CHIEF COMPLAINT:  hemorrhagic shock   History of Present Illness:  88 year old woman with recurrent UGIB with signs of hemorrhagic shock. HB: 6, patient normotensive, but atrial fibrillation rate control worse.    Had presented to [facility] 9/1 with coffee ground emesis and periumbilical pain.  Two duodenal ulcers were found, both with visible bleeding vessels. Cautery was unsuccessful.  Transferred to [facility] for angiography - no active contrast extravasation seen.  Empiric coiling of GDA. Received 1 unit PRBC and started on PPI IV.  Pertinent  Medical History  GI bleed, migraines, occipital neuralgia, essential tremor, cervical dystonia, SSS s/p pacemaker   Significant Hospital Events: Including procedures, antibiotic start and stop dates in addition to other pertinent events   9/1 admitted to [facility] with active duodenal ulcer.  9/3 IR GDA embolization [facility] 9/3 concern for recurrent bleeding; EGD with nonbleeding duodenal ulcers   Interim History / Subjective:  Patient feeling well this morning, feels strong. Worked with PT yesterday and did well. Had a small bowel movement yesterday with some bright red blood and a bowel movement this morning with no form to it and maroon tinge.   Objective   Blood pressure 136/88, pulse 77, temperature 99.1 F (37.3 C), temperature source Oral, resp. rate 14, height '5\' 2"'$  (1.575 m), weight 74.9 kg, SpO2 92 %.        Intake/Output Summary (Last 24 hours) at 08/31/2022 0617 Last data filed at 08/31/2022 0500 Gross per 24 hour  Intake 978.84 ml  Output 3475 ml  Net -2496.16 ml   Filed Weights   08/29/22 0324 08/29/22 0930  Weight: 73.3 kg 74.9 kg    Examination: General: Frail appearing elderly woman, pleasant and answering questions HENT: [place]/AT, eyes anicteric Lungs: CTAB, no wheezes, no rhonchi Cardiovascular:  regular rate and rhythm, +murmur Abdomen: soft, nontender, nondistended, +BS Extremities: warm, dry, marked bilateral LE edema Neuro: A&O x4, no focal deficits GU: Foley  Resolved Hospital Problem list     Assessment & Plan:  Critically ill due to suspected hemorrhagic shock from bleeding duodenal ulcers, S/P GDA embolization.  Acute blood loss anemia S/p 2u PRBC and 1u platelets on 9/3. Hb continues to slowly downtrend, 7.1 this morning. Maroon tinge to stool seen in AM.  - Transfuse 1u PRBC this morning - Maintain MAP>65; avoid crystalloids and overcorrecting hypotension as these may disrupt hemostatic plug.  - IV PPI gtt - IV Octreotide - Carafate 1 g q6h - Poor surgical candidate given co-morbidities. Per surgical resident's conversation with daughter, will transition to comfort care if apparent that will not survive without surgery. - Daily CBC - No further interventions per IR - GI consulted - If bleeding persists, obtain CTA   Atrial fibrillation with rapid ventricular response  Normal LV function with aortic valve sclerosis per echo 04/23 Likely tachy-brady syndrome given PPM. HR and BP improved. Not on anticoagulation or rate controlling agents at home.  Peripheral edema Diuresed with 2 doses of IV lasix 40 mg on 9/4, had ~4.3 L UOP. Remains volume overloaded - IV lasix 40 mg bid    Frailty Moderate malnutrition - NPO; advance diet as per GI   Hypokalemia K low again at 2.7 this morning. - Kclor 40 mEq oral + IV Kcl 10 mEq x4 doses - Daily BMP   Anxiety Depression Mild cognitive impairment  On  abilify 2 mg daily, buspar 10 mg bid, memantime 10 mg bid, primidone 100 mg daily, and venlafaxine 150 mg daily at home. - Continue home meds  Best Practice (right click and "Reselect all SmartList Selections" daily)   Diet/type: NPO DVT prophylaxis: not indicated GI prophylaxis: PPI Lines: R picc line Foley:  Yes, and it is still needed Code Status:  DNR Last  date of multidisciplinary goals of care discussion [updated daughter 9/4, continue to do so daily]  Dispo: Transfer out of ICU  PT/OT: Home Health PT/OT  Labs   CBC: Recent Labs  Lab 08/27/22 0606 08/27/22 0932 08/29/22 0635 08/29/22 1120 08/29/22 1708 08/30/22 0417 08/30/22 1757  WBC 11.9*  --  11.6*  --  10.9* 9.7 9.7  NEUTROABS 10.2*  --   --   --   --  7.0 7.1  HGB 11.4*   < > 6.0* 8.7* 8.0* 7.4* 7.4*  HCT 36.6   < > 17.9* 26.4* 23.1* 21.9* 22.2*  MCV 94.1  --  92.7  --  88.8 90.1 91.4  PLT 194  --  102*  --  107* 101* 114*   < > = values in this interval not displayed.    Basic Metabolic Panel: Recent Labs  Lab 08/27/22 0606 08/28/22 0159 08/29/22 0635 08/29/22 2150 08/30/22 0911  NA 139 141 139 138 137  K 5.1 4.3 3.4* 2.7* 4.3  CL 108 114* 114* 110 109  CO2 26 23 17* 23 23  GLUCOSE 122* 99 117* 118* 111*  BUN 41* 29* 30* 15 10  CREATININE 1.01* 0.74 0.74 0.76 0.62  CALCIUM 9.8 7.9* 7.6* 7.9* 7.5*  MG  --   --  1.3* 1.4* 2.3   GFR: Estimated Creatinine Clearance: 47.8 mL/min (by C-G formula based on SCr of 0.62 mg/dL). Recent Labs  Lab 08/29/22 0635 08/29/22 1708 08/30/22 0417 08/30/22 1757  WBC 11.6* 10.9* 9.7 9.7    Liver Function Tests: Recent Labs  Lab 08/27/22 0606 08/29/22 0635 08/30/22 0911  AST 23 24 34  ALT '13 14 22  '$ ALKPHOS 53 26* 32*  BILITOT 1.0 0.5 0.7  PROT 6.9 4.0* 4.3*  ALBUMIN 3.8 2.3* 2.5*   Recent Labs  Lab 08/27/22 0606  LIPASE 31   No results for input(s): "AMMONIA" in the last 168 hours.  ABG No results found for: "PHART", "PCO2ART", "PO2ART", "HCO3", "TCO2", "ACIDBASEDEF", "O2SAT"   Coagulation Profile: Recent Labs  Lab 08/27/22 0606  INR 1.1    Cardiac Enzymes: No results for input(s): "CKTOTAL", "CKMB", "CKMBINDEX", "TROPONINI" in the last 168 hours.  HbA1C: No results found for: "HGBA1C"  CBG: Recent Labs  Lab 08/29/22 0922 08/29/22 1809 08/29/22 2301 08/30/22 1137 08/30/22 2303  GLUCAP  101* 109* 112* 121* 102*    Review of Systems:   As above  Past Medical History:  She,  has a past medical history of Anxiety, Cervical dystonia, Essential tremor, GI bleed, Memory loss, Migraine, and Occipital neuralgia.   Surgical History:   Past Surgical History:  Procedure Laterality Date   BOWEL RESECTION     CARDIAC CATHETERIZATION  2016   no stents/[facility]   ESOPHAGOGASTRODUODENOSCOPY N/A 06/18/2020   Procedure: ESOPHAGOGASTRODUODENOSCOPY (EGD);  Surgeon: Toledo, Benay Pike, MD;  Location: [facility] ENDOSCOPY;  Service: Gastroenterology;  Laterality: N/A;   ESOPHAGOGASTRODUODENOSCOPY N/A 08/29/2022   Procedure: ESOPHAGOGASTRODUODENOSCOPY (EGD);  Surgeon: Daryel November, MD;  Location: [place];  Service: Gastroenterology;  Laterality: N/A;   PACEMAKER IMPLANT  REPLACEMENT TOTAL KNEE Left      Social History:   reports that she has never smoked. She has never been exposed to tobacco smoke. She has never used smokeless tobacco. She reports that she does not currently use alcohol. She reports that she does not use drugs.   Family History:  Her family history includes Other in her mother; Stomach cancer in her brother; Ulcers in her father.   Allergies Allergies  Allergen Reactions   Adhesive [Tape] Other (See Comments)    Contact dermatitis   Baclofen Rash   Lyrica [Pregabalin] Other (See Comments)    Unable to remember reaction.   Sulfa Antibiotics Rash     Home Medications  Prior to Admission medications   Medication Sig Start Date End Date Taking? Authorizing Provider  acetaminophen (TYLENOL) 500 MG tablet Take 1,000 mg by mouth 2 (two) times daily.   Yes [provider]  amLODipine (NORVASC) 2.5 MG tablet Take 2.5 mg by mouth daily.   Yes [provider]  ARIPiprazole (ABILIFY) 2 MG tablet Take 2 mg by mouth daily.   Yes [provider]  aspirin EC 81 MG tablet Take 81 mg by mouth daily. Swallow whole.   Yes  [provider]  aspirin-acetaminophen-caffeine (EXCEDRIN MIGRAINE) [phone] MG tablet Take 1 tablet by mouth every 6 (six) hours as needed for headache.   Yes [provider]  atorvastatin (LIPITOR) 10 MG tablet Take 10 mg by mouth daily.   Yes [provider]  busPIRone (BUSPAR) 10 MG tablet Take 10 mg by mouth 2 (two) times daily.   Yes [provider]  cetirizine (ZYRTEC) 10 MG tablet Take 10 mg by mouth daily as needed for allergies.   Yes [provider]  Chlorphen-Phenyleph-Ibuprofen (ADVIL MULTI-SYMPTOM COLD & FLU) 4-10-200 MG TABS Take 1 tablet by mouth every 8 (eight) hours as needed (cold symptoms).   Yes [provider]  cholecalciferol (VITAMIN D3) 25 MCG (1000 UNIT) tablet Take 1,000 Units by mouth daily.   Yes [provider]  docusate sodium (COLACE) 100 MG capsule Take 100 mg by mouth daily as needed for mild constipation.   Yes [provider]  gabapentin (NEURONTIN) 400 MG capsule Take 400 mg by mouth 2 (two) times daily.   Yes [provider]  memantine (NAMENDA) 10 MG tablet Take 10 mg by mouth 2 (two) times daily.   Yes [provider]  primidone (MYSOLINE) 50 MG tablet Take 100 mg by mouth daily.   Yes [provider]  venlafaxine XR (EFFEXOR XR) 150 MG 24 hr capsule Take 150 mg by mouth daily with breakfast.   Yes [provider]  vitamin B-12 (CYANOCOBALAMIN) 500 MCG tablet Take 500 mcg by mouth daily.   Yes [provider]     Critical care time: 25 mins

## 2022-09-01 DIAGNOSIS — D62 Acute posthemorrhagic anemia: Secondary | ICD-10-CM | POA: Diagnosis not present

## 2022-09-01 DIAGNOSIS — K264 Chronic or unspecified duodenal ulcer with hemorrhage: Secondary | ICD-10-CM | POA: Diagnosis not present

## 2022-09-01 DIAGNOSIS — I10 Essential (primary) hypertension: Secondary | ICD-10-CM | POA: Diagnosis not present

## 2022-09-01 DIAGNOSIS — K920 Hematemesis: Secondary | ICD-10-CM | POA: Diagnosis not present

## 2022-09-01 LAB — COMPREHENSIVE METABOLIC PANEL
ALT: 20 U/L (ref 0–44)
AST: 21 U/L (ref 15–41)
Albumin: 2.7 g/dL — ABNORMAL LOW (ref 3.5–5.0)
Alkaline Phosphatase: 37 U/L — ABNORMAL LOW (ref 38–126)
Anion gap: 5 (ref 5–15)
BUN: 9 mg/dL (ref 8–23)
CO2: 29 mmol/L (ref 22–32)
Calcium: 8 mg/dL — ABNORMAL LOW (ref 8.9–10.3)
Chloride: 103 mmol/L (ref 98–111)
Creatinine, Ser: 0.72 mg/dL (ref 0.44–1.00)
GFR, Estimated: >60 mL/min (ref >60)
Glucose, Bld: 95 mg/dL (ref 70–99)
Potassium: 3 mmol/L — ABNORMAL LOW (ref 3.5–5.1)
Sodium: 137 mmol/L (ref 135–145)
Total Bilirubin: 0.7 mg/dL (ref 0.3–1.2)
Total Protein: 4.7 g/dL — ABNORMAL LOW (ref 6.5–8.1)

## 2022-09-01 LAB — TYPE AND SCREEN
ABO/RH(D): A POS
Antibody Screen: NEGATIVE
Unit division: 0
Unit division: 0
Unit division: 0

## 2022-09-01 LAB — CBC WITH DIFFERENTIAL/PLATELET
Abs Immature Granulocytes: 0.06 10*3/uL (ref 0.00–0.07)
Abs Immature Granulocytes: 0.05 10*3/uL (ref 0.00–0.07)
Basophils Absolute: 0 10*3/uL (ref 0.0–0.1)
Basophils Absolute: 0.1 10*3/uL (ref 0.0–0.1)
Basophils Relative: 1 %
Basophils Relative: 1 %
Eosinophils Absolute: 0.3 10*3/uL (ref 0.0–0.5)
Eosinophils Absolute: 0.4 10*3/uL (ref 0.0–0.5)
Eosinophils Relative: 4 %
Eosinophils Relative: 4 %
HCT: 28.5 % — ABNORMAL LOW (ref 36.0–46.0)
HCT: 30.6 % — ABNORMAL LOW (ref 36.0–46.0)
Hemoglobin: 9.1 g/dL — ABNORMAL LOW (ref 12.0–15.0)
Hemoglobin: 10.1 g/dL — ABNORMAL LOW (ref 12.0–15.0)
Immature Granulocytes: 1 %
Immature Granulocytes: 1 %
Lymphocytes Relative: 19 %
Lymphocytes Relative: 20 %
Lymphs Abs: 1.5 10*3/uL (ref 0.7–4.0)
Lymphs Abs: 1.9 10*3/uL (ref 0.7–4.0)
MCH: 29.7 pg (ref 26.0–34.0)
MCH: 30.5 pg (ref 26.0–34.0)
MCHC: 31.9 g/dL (ref 30.0–36.0)
MCHC: 33 g/dL (ref 30.0–36.0)
MCV: 93.1 fL (ref 80.0–100.0)
MCV: 92.4 fL (ref 80.0–100.0)
Monocytes Absolute: 0.8 10*3/uL (ref 0.1–1.0)
Monocytes Absolute: 0.8 10*3/uL (ref 0.1–1.0)
Monocytes Relative: 10 %
Monocytes Relative: 9 %
Neutro Abs: 5.4 10*3/uL (ref 1.7–7.7)
Neutro Abs: 6.3 10*3/uL (ref 1.7–7.7)
Neutrophils Relative %: 65 %
Neutrophils Relative %: 65 %
Platelets: 134 10*3/uL — ABNORMAL LOW (ref 150–400)
Platelets: 150 10*3/uL (ref 150–400)
RBC: 3.06 MIL/uL — ABNORMAL LOW (ref 3.87–5.11)
RBC: 3.31 MIL/uL — ABNORMAL LOW (ref 3.87–5.11)
RDW: 16.8 % — ABNORMAL HIGH (ref 11.5–15.5)
RDW: 17.2 % — ABNORMAL HIGH (ref 11.5–15.5)
WBC: 8 10*3/uL (ref 4.0–10.5)
WBC: 9.4 10*3/uL (ref 4.0–10.5)
nRBC: 0 % (ref 0.0–0.2)
nRBC: 0.2 % (ref 0.0–0.2)

## 2022-09-01 LAB — BPAM RBC
Blood Product Expiration Date: 202309222359
Blood Product Expiration Date: 202309252359
Blood Product Expiration Date: 202309292359
ISSUE DATE / TIME: 202309030832
ISSUE DATE / TIME: 202309030939
ISSUE DATE / TIME: 202309050943
Unit Type and Rh: 6200
Unit Type and Rh: 6200
Unit Type and Rh: 6200

## 2022-09-01 LAB — GLUCOSE, CAPILLARY: Glucose-Capillary: 94 mg/dL (ref 70–99)

## 2022-09-01 LAB — MAGNESIUM: Magnesium: 1.7 mg/dL (ref 1.7–2.4)

## 2022-09-01 MED ORDER — MAGNESIUM SULFATE 2 GM/50ML IV SOLN: 2.0000 g | Freq: Once | INTRAVENOUS | Status: DC

## 2022-09-01 MED ORDER — POTASSIUM CHLORIDE CRYS ER 20 MEQ PO TBCR: 40.0000 meq | EXTENDED_RELEASE_TABLET | ORAL | Status: DC

## 2022-09-01 MED ORDER — POTASSIUM CHLORIDE 20 MEQ PO PACK
40.0000 meq | PACK | Freq: Two times a day (BID) | ORAL | Status: AC
  Administered 2022-09-01 (×2): 40 meq via ORAL
  Filled 2022-09-01 (×2): qty 2

## 2022-09-01 MED ORDER — MAGNESIUM SULFATE 2 GM/50ML IV SOLN
2.0000 g | Freq: Once | INTRAVENOUS | Status: AC
  Administered 2022-09-01: 2 g via INTRAVENOUS
  Filled 2022-09-01: qty 50

## 2022-09-01 MED ORDER — PANTOPRAZOLE SODIUM 40 MG PO TBEC
40.0000 mg | DELAYED_RELEASE_TABLET | Freq: Two times a day (BID) | ORAL | Status: DC
  Administered 2022-09-01 – 2022-09-03 (×5): 40 mg via ORAL
  Filled 2022-09-01 (×5): qty 1

## 2022-09-01 MED ORDER — METOPROLOL TARTRATE 5 MG/5ML IV SOLN: 5.0000 mg | Freq: Four times a day (QID) | INTRAVENOUS | Status: DC | PRN

## 2022-09-01 NOTE — Progress Notes (Signed)
[facility] PROGRESS NOTE    Progress Note  Regina Hernandez  RKY:[id number] DOB: Dec 11, 1934 DOA: 08/28/2022 PCP: Lesleigh Noe, MD     Brief Narrative:   Regina Hernandez is an 88 y.o. female past medical history of recurrent GI bleed, occipital neuralgias essential tremors, comes in with recurrent GI bleed, hemorrhagic shock hemoglobin of 6 and A-fib with RVR, admitted to [facility] for coffee-ground emesis and paramedical pain EGD was done that showed 2 duodenal ulcers both visible bleeding vessels cauterization was unsuccessful transferred to [facility] for angiography with no contrast extravasation, empiric coiling of GDA, status post several units of packed red blood cells started on IV PPI GI was consulted.  Significant Hospital Events: Including procedures, antibiotic start and stop dates in addition to other pertinent events   9/1 admitted to [facility] with active duodenal ulcer.  9/3 IR GDA embolization [facility] 9/3 concern for recurrent bleeding; EGD with nonbleeding duodenal ulcers    Assessment/Plan:   Acute blood loss anemia (ABLA)/hemorrhagic shock from a bleeding duodenal ulcer status post GAD embolization: Status post 2 units of packed red blood cells on 08/29/2022. Hemoglobin continues to slowly trend down. Currently on IV PPI and IV octreotide. She is a poor surgical candidate per surgical teams.  Surgical resident had a conversation with daughter and will transfer to comfort care if apparent that will not survive without surgery. Status post GAD embolization by IR, they recommended no further intervention. GI has been consulted, who recommended to transition Protonix to oral formulation twice a day. Continue clear liquid diet. GI relates that the lesions are not amenable for repeat endoscopy or interventional if the patient continues to have recurrent large-volume hemorrhage and instability will have to move towards comfort measures. GI also recommended to continue to try for 72 hours and  stop. Hemoglobin this morning is 9.1 after transfusion. Continue CBCs every 12.  Chronic atrial fibrillation with RVR: In the setting of GI bleed. Not a candidate for anticoagulation. Has a history of tachybradycardia syndrome with PPM. Heart rate is improved, on no rate controlling medication. Metoprolol IV as needed.  Peripheral edema: She is negative about 4 L. Continue Lasix IV twice daily.  Moderate protein caloric malnutrition: GI recommended clear liquid diet .  Hypokalemia: Potassium is low this morning we will resume continue to replete orally recheck tomorrow morning. Magnesium is 1.7, replete IV recheck tomorrow morning.  Anxiety/depression/mild cognitive impairment: Continue Abilify, BuSpar, memantine, primidone and Venlafaxine.   DVT prophylaxis: scd Family Communication:  Daughter Status is: Inpatient Remains inpatient appropriate because: Recurrent acute GI bleed    Code Status:     Code Status Orders  (From admission, onward)           Start     Ordered   08/29/22 0102  Do not attempt resuscitation (DNR)  Continuous       Question Answer Comment  In the event of cardiac or respiratory ARREST Do not call a "code blue"   In the event of cardiac or respiratory ARREST Do not perform Intubation, CPR, defibrillation or ACLS   In the event of cardiac or respiratory ARREST Use medication by any route, position, wound care, and other measures to relive pain and suffering. May use oxygen, suction and manual treatment of airway obstruction as needed for comfort.   Comments Patient has a universal DNR      08/29/22 0102           Code Status History     Date Active  Date Inactive Code Status Order ID Comments User Context   08/27/2022 0937 08/28/2022 2358 DNR [id number]  Collier Bullock, MD ED   07/21/2020 1336 07/24/2020 2235 DNR [id number]  Collier Bullock, MD ED   07/01/2020 1622 07/04/2020 1742 DNR [id number]  Sharen Hones, MD ED   06/22/2020 0016 06/23/2020  1731 DNR [id number]  Lenore Cordia, MD ED   06/18/2020 0134 06/19/2020 2324 Full Code [id number]  Elwyn Reach, MD ED      Advance Directive Documentation    Flowsheet Row Most Recent Value  Type of Advance Directive Healthcare Power of Attorney, Living will  Pre-existing out of facility DNR order (yellow form or pink MOST form) --  "MOST" Form in Place? --         IV Access:   Peripheral IV   Procedures and diagnostic studies:   No results found.   Medical Consultants:   None.   Subjective:    Regina Hernandez she relates she has had some black stools but none since yesterday.  Objective:    Vitals:   09/01/22 0700 09/01/22 0719 09/01/22 0800 09/01/22 0900  BP: (!) 145/94  (!) 155/89 (!) 148/79  Pulse: 67  76 74  Resp: 16  (!) 21 17  Temp:  98.3 F (36.8 C)    TempSrc:  Oral    SpO2: 95%  94% 96%  Weight:      Height:       SpO2: 96 % O2 Flow Rate (L/min): 2 L/min   Intake/Output Summary (Last 24 hours) at 09/01/2022 0911 Last data filed at 09/01/2022 0800 Gross per 24 hour  Intake 2793.65 ml  Output 4826 ml  Net -2032.35 ml   Filed Weights   08/29/22 0324 08/29/22 0930  Weight: 73.3 kg 74.9 kg    Exam: General exam: In no acute distress. Respiratory system: Good air movement and clear to auscultation. Cardiovascular system: S1 & S2 heard, RRR. No JVD. Gastrointestinal system: Abdomen is nondistended, soft and nontender.  Extremities: No pedal edema. Skin: No rashes, lesions or ulcers Psychiatry: Judgement and insight appear normal. Mood & affect appropriate.    Data Reviewed:    Labs: Basic Metabolic Panel: Recent Labs  Lab 08/29/22 0635 08/29/22 2150 08/30/22 0911 08/31/22 0615 08/31/22 1559 09/01/22 0615  NA 139 138 137 136 139 137  K 3.4* 2.7* 4.3 2.7* 3.7 3.0*  CL 114* 110 109 102 102 103  CO2 17* '23 23 28 29 29  '$ GLUCOSE 117* 118* 111* 102* 136* 95  BUN 30* '15 10 11 10 9  '$ CREATININE 0.74 0.76 0.62 0.69 0.72 0.72   CALCIUM 7.6* 7.9* 7.5* 7.6* 8.0* 8.0*  MG 1.3* 1.4* 2.3 1.7  --  1.7   GFR Estimated Creatinine Clearance: 47.8 mL/min (by C-G formula based on SCr of 0.72 mg/dL). Liver Function Tests: Recent Labs  Lab 08/27/22 0606 08/29/22 0635 08/30/22 0911 08/31/22 0615 09/01/22 0615  AST 23 24 34 25 21  ALT '13 14 22 22 20  '$ ALKPHOS 53 26* 32* 35* 37*  BILITOT 1.0 0.5 0.7 0.7 0.7  PROT 6.9 4.0* 4.3* 4.3* 4.7*  ALBUMIN 3.8 2.3* 2.5* 2.4* 2.7*   Recent Labs  Lab 08/27/22 0606  LIPASE 31   No results for input(s): "AMMONIA" in the last 168 hours. Coagulation profile Recent Labs  Lab 08/27/22 0606  INR 1.1   COVID-19 Labs  No results for input(s): "DDIMER", "FERRITIN", "LDH", "CRP" in the last 72 hours.  Lab Results  Component  Value Date   SARSCOV2NAA NEGATIVE 06/03/2022   [place] NEGATIVE 07/21/2020   [place] NEGATIVE 07/01/2020   [place] NEGATIVE 06/21/2020    CBC: Recent Labs  Lab 08/30/22 0417 08/30/22 1757 08/31/22 0615 08/31/22 1559 09/01/22 0615  WBC 9.7 9.7 8.6 9.4 8.0  NEUTROABS 7.0 7.1 6.1 6.5 5.4  HGB 7.4* 7.4* 7.1* 9.1* 9.1*  HCT 21.9* 22.2* 21.1* 26.8* 28.5*  MCV 90.1 91.4 93.4 90.5 93.1  PLT 101* 114* 112* 133* 134*   Cardiac Enzymes: No results for input(s): "CKTOTAL", "CKMB", "CKMBINDEX", "TROPONINI" in the last 168 hours. BNP (last 3 results) No results for input(s): "PROBNP" in the last 8760 hours. CBG: Recent Labs  Lab 08/29/22 1809 08/29/22 2301 08/30/22 1137 08/30/22 2303 08/31/22 1141  GLUCAP 109* 112* 121* 102* 101*   D-Dimer: No results for input(s): "DDIMER" in the last 72 hours. Hgb A1c: No results for input(s): "HGBA1C" in the last 72 hours. Lipid Profile: No results for input(s): "CHOL", "HDL", "LDLCALC", "TRIG", "CHOLHDL", "LDLDIRECT" in the last 72 hours. Thyroid function studies: No results for input(s): "TSH", "T4TOTAL", "T3FREE", "THYROIDAB" in the last 72 hours.  Invalid input(s): "FREET3" Anemia work  up: No results for input(s): "VITAMINB12", "FOLATE", "FERRITIN", "TIBC", "IRON", "RETICCTPCT" in the last 72 hours. Sepsis Labs: Recent Labs  Lab 08/30/22 1757 08/31/22 0615 08/31/22 1559 09/01/22 0615  WBC 9.7 8.6 9.4 8.0   Microbiology Recent Results (from the past 240 hour(s))  MRSA Next Gen by PCR, Nasal     Status: None   Collection Time: 08/27/22  3:38 PM   Specimen: Nasal Mucosa; Nasal Swab  Result Value Ref Range Status   MRSA by PCR Next Gen NOT DETECTED NOT DETECTED Final    Comment: (NOTE) The GeneXpert MRSA Assay (FDA approved for NASAL specimens only), is one component of a comprehensive MRSA colonization surveillance program. It is not intended to diagnose MRSA infection nor to guide or monitor treatment for MRSA infections. Test performance is not FDA approved in patients less than 54 years old. Performed at [facility], [address] [place], [place] [address]      Medications:    sodium chloride   Intravenous Once   ARIPiprazole  2 mg Oral Daily   busPIRone  10 mg Oral BID   Chlorhexidine Gluconate Cloth  6 each Topical Daily   gabapentin  400 mg Oral BID   loratadine  10 mg Oral Daily   memantine  10 mg Oral BID   primidone  100 mg Oral Daily   sodium chloride flush  10-40 mL Intracatheter Q12H   sucralfate  1 g Oral Q6H   venlafaxine XR  150 mg Oral Q breakfast   Continuous Infusions:  sodium chloride Stopped (08/29/22 1639)   octreotide (SANDOSTATIN) 500 mcg in sodium chloride 0.9 % 250 mL (2 mcg/mL) infusion 50 mcg/hr (09/01/22 0800)      LOS: 4 days   Charlynne Cousins  [facility]  09/01/2022, 9:11 AM

## 2022-09-01 NOTE — Progress Notes (Addendum)
Progress Note  Primary GI: Dr. Alice Reichert   Subjective  Chief Complaint: Bleeding duodenal ulcer  Patient sitting in chair, states she walked with physical therapy this morning and planning to be discharged to skilled nursing facility. States she feels much better this morning. Denies bowel movement today, had 3 bowel movements yesterday slightly dark soft. Denies nausea and vomiting.    Objective   Vital signs in last 24 hours: Temp:  [98 F (36.7 C)-99.1 F (37.3 C)] 98 F (36.7 C) (09/06 1132) Pulse Rate:  [67-81] 77 (09/06 1300) Resp:  [12-23] 16 (09/06 1300) BP: (104-163)/(69-94) 141/78 (09/06 1300) SpO2:  [91 %-96 %] 96 % (09/06 1300) Last BM Date : 08/31/22 Last BM recorded by nurses in past 5 days Stool Type: Type 7 (Liquid consistency with no solid pieces) (08/31/2022 12:45 PM)  General:   female in no acute distress , thin Heart:  Regular rate and rhythm; systolic murmur Pulm: Clear anteriorly; no wheezing Abdomen:  Soft, Non-distended AB, Active bowel sounds. No tenderness , No organomegaly appreciated. Extremities:  without  edema. Neurologic:  Alert and  oriented x4;  No focal deficits.  Psych:  Cooperative. Normal mood and affect.  Intake/Output from previous day: 09/05 0701 - 09/06 0700 In: 2506 [P.O.:690; I.V.:913.2; Blood:653; IV Piggyback:249.9] Out: 1607 [Urine:4375; Stool:1] Intake/Output this shift: Total I/O In: 509.5 [P.O.:400; I.V.:109.5] Out: 670 [Urine:670]  Studies/Results: No results found.  Lab Results: Recent Labs    08/31/22 0615 08/31/22 1559 09/01/22 0615  WBC 8.6 9.4 8.0  HGB 7.1* 9.1* 9.1*  HCT 21.1* 26.8* 28.5*  PLT 112* 133* 134*   BMET Recent Labs    08/31/22 0615 08/31/22 1559 09/01/22 0615  NA 136 139 137  K 2.7* 3.7 3.0*  CL 102 102 103  CO2 '28 29 29  '$ GLUCOSE 102* 136* 95  BUN '11 10 9  '$ CREATININE 0.69 0.72 0.72  CALCIUM 7.6* 8.0* 8.0*   LFT Recent Labs    09/01/22 0615  PROT 4.7*  ALBUMIN 2.7*  AST  21  ALT 20  ALKPHOS 37*  BILITOT 0.7   PT/INR No results for input(s): "LABPROT", "INR" in the last 72 hours.   Impression/Plan:   Bleeding duodenal ulcer 08/27/2022 EGD at [facility] with cauterization and epinephrine injection of duodenal ulcer.   Repeat EGD (#2) 9/2 for rebleeding again cauterized and hemostasis achieved.   Bled again later on 9/2, transferred to [facility].   9/2 CTA with active bleeding at D2.   Underwent IR embolization of GDA evening of 9/2.   Rebled morning of 9/3 associated with A-fib/RVR.   EGD #3 on 9/3 revealed a large duodenal ulcer with VV and small amount of adherent fresh clot, no interventions performed  Patient hemodynamically stable, hemoglobin stable at 9.1. Please advance diet as tolerated. Sandostatin gtt completed after 72 hours and can also be stopped. Protonix gtt finished today, on Protonix 40 mg twice daily, continue this outpatient Carafate 1 g 4 times daily for 12 days outpatient. Counseled on avoiding all NSAIDs/Goody powders outpatient Will need close follow-up with her primary GI doctor, Dr. Alice Reichert outpatient.    LOS: 4 days   Vladimir Crofts  09/01/2022, 1:42 PM   Attending physician's note   I have taken a history, reviewed the chart and examined the patient. I performed a substantive portion of this encounter, including complete performance of at least one of the key components, in conjunction with the APP. I agree with the APP's  note, impression and recommendations.    Hemoglobin is stable.  No further bleeding Slowly advance diet as tolerated Continue PPI twice daily Avoid NSAIDs GI will sign off, available if have questions Follow-up with outpatient GI on discharge   The patient was provided an opportunity to ask questions and all were answered. The patient agreed with the plan and demonstrated an understanding of the instructions.   Damaris Hippo , MD [phone]

## 2022-09-01 NOTE — Progress Notes (Signed)
Physical Therapy Treatment Patient Details Name: Regina Hernandez MRN: [id number] DOB: Jan 26, 1934 Today's Date: 09/01/2022   History of Present Illness Pt is an 88 year old woman admitted to [facility] on 9/1 with GIB, anemia and hemmorrhagic shock.Transferred to [facility] for embolization on 08/29/22. PMH: GIB, migraines, occipital neuralgia, essential tremor, cervical dystonia, SSS s/p pacemaker, depression, mild cognitive impairment.    PT Comments    Pt progressing well toward goals.  Emphasis on safety with sit to stand and progression of gait stability/stamina.   Recommendations for follow up therapy are one component of a multi-disciplinary discharge planning process, led by the attending physician.  Recommendations may be updated based on patient status, additional functional criteria and insurance authorization.  Follow Up Recommendations  Home health PT     Assistance Recommended at Discharge Set up Supervision/Assistance  Patient can return home with the following A little help with bathing/dressing/bathroom;Assistance with cooking/housework;Assist for transportation   Equipment Recommendations  None recommended by PT    Recommendations for Other Services       Precautions / Restrictions Precautions Precautions: Fall     Mobility  Bed Mobility               General bed mobility comments: OOB in recliner upon entry    Transfers Overall transfer level: Needs assistance   Transfers: Sit to/from Stand Sit to Stand: Min guard                Ambulation/Gait Ambulation/Gait assistance: Min guard, Min assist Gait Distance (Feet): 140 Feet Assistive device: Rolling walker (2 wheels), IV Pole Gait Pattern/deviations: Step-through pattern   Gait velocity interpretation: <1.31 ft/sec, indicative of household ambulator   General Gait Details: generally steady, but with 1 deviation that needed min assist due to mild stagger R to regain LOB.   Stairs              Wheelchair Mobility    Modified Rankin (Stroke Patients Only)       Balance Overall balance assessment: Needs assistance   Sitting balance-Leahy Scale: Fair     Standing balance support: Single extremity supported, No upper extremity supported, During functional activity Standing balance-Leahy Scale: Fair Standing balance comment: relies on at least 1 UE support dynamically but able to engage in grooming without UE support standing                            Cognition Arousal/Alertness: Awake/alert Behavior During Therapy: WFL for tasks assessed/performed Overall Cognitive Status: Within Functional Limits for tasks assessed                                 General Comments: eager to get OOB and to return home        Exercises      General Comments General comments (skin integrity, edema, etc.): Again HR tachy in the 120's      Pertinent Vitals/Pain Pain Assessment Pain Assessment: Faces Faces Pain Scale: No hurt Pain Intervention(s): Monitored during session    Home Living                          Prior Function            PT Goals (current goals can now be found in the care plan section) Acute Rehab PT Goals Patient Stated Goal: home independent PT  Goal Formulation: With patient Time For Goal Achievement: 09/13/22 Potential to Achieve Goals: Good Progress towards PT goals: Progressing toward goals    Frequency    Min 3X/week      PT Plan Current plan remains appropriate    Co-evaluation              AM-PAC PT "6 Clicks" Mobility   Outcome Measure  Help needed turning from your back to your side while in a flat bed without using bedrails?: None Help needed moving from lying on your back to sitting on the side of a flat bed without using bedrails?: None Help needed moving to and from a bed to a chair (including a wheelchair)?: A Little Help needed standing up from a chair using your arms (e.g.,  wheelchair or bedside chair)?: A Little Help needed to walk in hospital room?: A Little Help needed climbing 3-5 steps with a railing? : A Little 6 Click Score: 20    End of Session   Activity Tolerance: Patient tolerated treatment well Patient left: in bed;with call bell/phone within reach Nurse Communication: Mobility status PT Visit Diagnosis: Other abnormalities of gait and mobility (R26.89)     Time: 1448-1500 PT Time Calculation (min) (ACUTE ONLY): 12 min  Charges:  $Gait Training: 8-22 mins                     09/01/2022  Ginger Carne., PT Acute Rehabilitation Services [phone]  (pager) [phone]  (office)   Regina Hernandez 09/01/2022, 5:53 PM

## 2022-09-01 NOTE — Progress Notes (Signed)
Occupational Therapy Treatment Patient Details Name: Regina Hernandez MRN: [id number] DOB: 03-09-1934 Today's Date: 09/01/2022   History of present illness Pt is an 88 year old woman admitted to [facility] on 9/1 with GIB, anemia and hemmorrhagic shock.Transferred to [facility] for embolization on 08/29/22. PMH: GIB, migraines, occipital neuralgia, essential tremor, cervical dystonia, SSS s/p pacemaker, depression, mild cognitive impairment.   OT comments  Pt seated in recliner upon entry, agreeable to OT session.  Completing transfers and mobility with min guard, pt preference to UE support on IV pole during mobility.  LB dressing with min assist, grooming standing at sink with min guard.  HR up to low 130s with activity, but recovers quickly with rest.  Will follow acutely.    Recommendations for follow up therapy are one component of a multi-disciplinary discharge planning process, led by the attending physician.  Recommendations may be updated based on patient status, additional functional criteria and insurance authorization.    Follow Up Recommendations  Home health OT    Assistance Recommended at Discharge Frequent or constant Supervision/Assistance  Patient can return home with the following  A little help with walking and/or transfers;A little help with bathing/dressing/bathroom;Assistance with cooking/housework;Assist for transportation;Help with stairs or ramp for entrance   Equipment Recommendations  None recommended by OT    Recommendations for Other Services      Precautions / Restrictions Precautions Precautions: Fall Restrictions Weight Bearing Restrictions: No       Mobility Bed Mobility               General bed mobility comments: OOB in recliner upon entry    Transfers Overall transfer level: Needs assistance Equipment used: None Transfers: Sit to/from Stand, Bed to chair/wheelchair/BSC Sit to Stand: Min guard           General transfer comment: for  safety/balance     Balance Overall balance assessment: Needs assistance Sitting-balance support: No upper extremity supported Sitting balance-Leahy Scale: Fair     Standing balance support: Single extremity supported, No upper extremity supported, During functional activity Standing balance-Leahy Scale: Fair Standing balance comment: relies on at least 1 UE support dynamically but able to engage in grooming without UE support standing                           ADL either performed or assessed with clinical judgement   ADL Overall ADL's : Needs assistance/impaired     Grooming: Min guard;Wash/dry hands;Standing               Lower Body Dressing: Minimal assistance;Sit to/from stand Lower Body Dressing Details (indicate cue type and reason): able to adjust socks, min assist to fully don. min guard in standing Toilet Transfer: Min guard;Ambulation           Functional mobility during ADLs: Min guard General ADL Comments: HR up to 130s with mobility, pt relies on 1 UE support using IV pole    Extremity/Trunk Assessment              Vision       Perception     Praxis      Cognition Arousal/Alertness: Awake/alert Behavior During Therapy: WFL for tasks assessed/performed Overall Cognitive Status: Within Functional Limits for tasks assessed  Exercises      Shoulder Instructions       General Comments HR up to low 130s with activity    Pertinent Vitals/ Pain       Pain Assessment Pain Assessment: No/denies pain  Home Living                                          Prior Functioning/Environment              Frequency  Min 2X/week        Progress Toward Goals  OT Goals(current goals can now be found in the care plan section)  Progress towards OT goals: Progressing toward goals  Acute Rehab OT Goals OT Goal Formulation: With patient Time For Goal  Achievement: 09/13/22 Potential to Achieve Goals: Good  Plan Discharge plan remains appropriate;Frequency remains appropriate    Co-evaluation                 AM-PAC OT "6 Clicks" Daily Activity     Outcome Measure   Help from another person eating meals?: None Help from another person taking care of personal grooming?: A Little Help from another person toileting, which includes using toliet, bedpan, or urinal?: A Little Help from another person bathing (including washing, rinsing, drying)?: A Little Help from another person to put on and taking off regular upper body clothing?: A Little Help from another person to put on and taking off regular lower body clothing?: A Little 6 Click Score: 19    End of Session Equipment Utilized During Treatment: Gait belt  OT Visit Diagnosis: Muscle weakness (generalized) (M62.81)   Activity Tolerance Patient tolerated treatment well   Patient Left in chair;with call bell/phone within reach   Nurse Communication Mobility status        Time: 7412-8786 OT Time Calculation (min): 16 min  Charges: OT General Charges $OT Visit: 1 Visit OT Treatments $Self Care/Home Management : 8-22 mins  Jolaine Artist, OT Acute Rehabilitation Services Office [phone]   Delight Stare 09/01/2022, 11:36 AM

## 2022-09-01 NOTE — Progress Notes (Signed)
This chaplain responded to PMT consult for creating/updating the Pt. Advance Directive. The Pt. is awake in the bedside recliner. Family is not at the bedside. The chaplain was updated by the Pt. RN-Manda before the visit.  The chaplain understands the Pt. completed durable POA. The chaplain provided Pt. education on HCPOA and the role of the Pt. chosen healthcare agent, daughter-Kim Stoneking.  The chaplain understands the Pt. prefers to talk to [place] before signing the documents. The chaplain shares her availability and updated the Pt. and the RN on the hours available for notarizing.  The Pt. shares she is "keeping her fingers crossed that she will continue to improve."  This chaplain is available for F/U spiritual care as needed.  Chaplain Sallyanne Kuster [phone]

## 2022-09-02 DIAGNOSIS — D62 Acute posthemorrhagic anemia: Secondary | ICD-10-CM | POA: Diagnosis not present

## 2022-09-02 DIAGNOSIS — K264 Chronic or unspecified duodenal ulcer with hemorrhage: Secondary | ICD-10-CM | POA: Diagnosis not present

## 2022-09-02 LAB — COMPREHENSIVE METABOLIC PANEL
ALT: 18 U/L (ref 0–44)
AST: 19 U/L (ref 15–41)
Albumin: 2.8 g/dL — ABNORMAL LOW (ref 3.5–5.0)
Alkaline Phosphatase: 45 U/L (ref 38–126)
Anion gap: 7 (ref 5–15)
BUN: 7 mg/dL — ABNORMAL LOW (ref 8–23)
CO2: 27 mmol/L (ref 22–32)
Calcium: 8.5 mg/dL — ABNORMAL LOW (ref 8.9–10.3)
Chloride: 100 mmol/L (ref 98–111)
Creatinine, Ser: 0.73 mg/dL (ref 0.44–1.00)
GFR, Estimated: >60 mL/min (ref >60)
Glucose, Bld: 102 mg/dL — ABNORMAL HIGH (ref 70–99)
Potassium: 3.7 mmol/L (ref 3.5–5.1)
Sodium: 134 mmol/L — ABNORMAL LOW (ref 135–145)
Total Bilirubin: 0.7 mg/dL (ref 0.3–1.2)
Total Protein: 5 g/dL — ABNORMAL LOW (ref 6.5–8.1)

## 2022-09-02 LAB — MAGNESIUM: Magnesium: 2.1 mg/dL (ref 1.7–2.4)

## 2022-09-02 MED ORDER — METOPROLOL TARTRATE 12.5 MG HALF TABLET
12.5000 mg | ORAL_TABLET | Freq: Two times a day (BID) | ORAL | Status: DC
  Administered 2022-09-02 – 2022-09-03 (×3): 12.5 mg via ORAL
  Filled 2022-09-02 (×3): qty 1

## 2022-09-02 NOTE — Psychosocial Assessment (Signed)
Patient transferred from 54M to 5W-19, she is alert and oriented, denies pain. No sign of bleeding. Patient made familiar with surroundings, call bell is within patients reach.

## 2022-09-02 NOTE — TOC Progression Note (Signed)
Transition of Care [facility]) - Progression Note    Patient Details  Name: Regina Hernandez MRN: [id number] Date of Birth: [birth year]  [facility] CM/SW Contact  Sharin Mons, RN Phone Number: 09/02/2022, 2:09 PM  Clinical Narrative:    NCM spoke with pt @ bedside regarding transition of care needs.Pt will transition to home with daughter. Pt will have 24/7 supervision /assistance @ home. NCM shared PT 's evaluation/recommendations for HHPT services. Pt agreeable to home health PT services. Pt without provider preference. Referral made with [facility] and accepted pending MD's order. Pt states already has RW @ home . Requesting DME BSC, TOC to f/u prior to d/c.  TOC team following and will continue assisting with needs....  Expected Discharge Plan: [place] Barriers to Discharge: Continued Medical Work up  Expected Discharge Plan and Services Expected Discharge Plan: [place] In-house Referral: NA Discharge Planning Services: CM Consult Post Acute Care Choice: [place] arrangements for the past 2 months: Single Family Home                 DME Arranged: N/A DME Agency: NA       HH Arranged: PT, OT, Nurse's Aide, Social Work [employer] Agency: [facility] Date [place]: 09/02/22 Time [place]: [phone] Representative spoke with at [place]: [place] (Tony) Interventions    Readmission Risk Interventions    08/31/2022    1:56 PM 07/24/2020    2:41 PM  Readmission Risk Prevention Plan  Transportation Screening Complete Complete  PCP or Specialist Appt within 5-7 Days Complete   Home Care Screening Complete   Medication Review (RN CM) Referral to Pharmacy   Palliative Care Screening  Not Applicable  Medication Review (RN Care Manager)  Complete

## 2022-09-02 NOTE — Progress Notes (Addendum)
[facility] PROGRESS NOTE    Progress Note  Regina Hernandez  ZOX:[id number] DOB: Nov 27, 1934 DOA: 08/28/2022 PCP: Lesleigh Noe, MD     Brief Narrative:   Regina Hernandez is an 88 y.o. female past medical history of recurrent GI bleed, occipital neuralgias essential tremors, comes in with recurrent GI bleed, hemorrhagic shock hemoglobin of 6 and A-fib with RVR, admitted to [facility] for coffee-ground emesis and paramedical pain EGD was done that showed 2 duodenal ulcers both visible bleeding vessels cauterization was unsuccessful transferred to [facility] for angiography with no contrast extravasation, empiric coiling of GDA, status post several units of packed red blood cells started on IV PPI GI was consulted. She is a poor surgical candidate per surgical teams.  Surgical resident had a conversation with daughter and will transfer to comfort care if apparent that will not survive without surgery. Status post GAD embolization by IR, they recommended no further intervention. GI relates that the lesions are not amenable for repeat endoscopy or interventional if the patient continues to have recurrent large-volume hemorrhage and instability will have to move towards comfort measures.  Significant Hospital Events: Including procedures, antibiotic start and stop dates in addition to other pertinent events   9/1 admitted to [facility] with active duodenal ulcer.  9/3 IR GDA embolization [facility] 9/3 concern for recurrent bleeding; EGD with nonbleeding duodenal ulcers    Assessment/Plan:   Acute blood loss anemia (ABLA)/hemorrhagic shock from a bleeding duodenal ulcer status post GAD embolization: Status post 2 units of packed red blood cells on 08/29/2022. Currently on IV PPI and IV octreotide. Advance diet as tolerate it. Hbg today is 10.  Chronic atrial fibrillation with RVR: In the setting of GI bleed. Not a candidate for anticoagulation. Has a history of tachybradycardia syndrome with PPM. Heart rate is  improved, on no rate controlling medication. Metoprolol IV as needed.  Peripheral edema: She is negative about 4 L. Continue Lasix IV twice daily.  Moderate protein caloric malnutrition: GI recommended clear liquid diet .  Hypokalemia/hypomagnasemia: Potassium is 3.7, mag 2.0  Anxiety/depression/mild cognitive impairment: Continue Abilify, BuSpar, memantine, primidone and Venlafaxine.   DVT prophylaxis: scd Family Communication:  Daughter Status is: Inpatient Remains inpatient appropriate because: Recurrent acute GI bleed    Code Status:     Code Status Orders  (From admission, onward)           Start     Ordered   08/29/22 0102  Do not attempt resuscitation (DNR)  Continuous       Question Answer Comment  In the event of cardiac or respiratory ARREST Do not call a "code blue"   In the event of cardiac or respiratory ARREST Do not perform Intubation, CPR, defibrillation or ACLS   In the event of cardiac or respiratory ARREST Use medication by any route, position, wound care, and other measures to relive pain and suffering. May use oxygen, suction and manual treatment of airway obstruction as needed for comfort.   Comments Patient has a universal DNR      08/29/22 0102           Code Status History     Date Active Date Inactive Code Status Order ID Comments User Context   08/27/2022 0937 08/28/2022 2358 DNR [id number]  Collier Bullock, MD ED   07/21/2020 1336 07/24/2020 2235 DNR [id number]  Collier Bullock, MD ED   07/01/2020 1622 07/04/2020 1742 DNR [id number]  Sharen Hones, MD ED   06/22/2020 0016 06/23/2020 1731 DNR [id number]  Lenore Cordia, MD ED   06/18/2020 0134 06/19/2020 2324 Full Code [id number]  Elwyn Reach, MD ED      Advance Directive Documentation    Flowsheet Row Most Recent Value  Type of Advance Directive Healthcare Power of Attorney, Living will  Pre-existing out of facility DNR order (yellow form or pink MOST form) --  "MOST" Form in Place? --          IV Access:   Peripheral IV   Procedures and diagnostic studies:   No results found.   Medical Consultants:   None.   Subjective:    Regina Hernandez no further bleeding.  Objective:    Vitals:   09/02/22 0400 09/02/22 0700 09/02/22 0717 09/02/22 0800  BP: (!) 160/89   (!) 150/86  Pulse: 73 76  98  Resp: 18 20  (!) 21  Temp: 98.4 F (36.9 C)  98.6 F (37 C)   TempSrc: Oral  Oral   SpO2: 92% 94%  95%  Weight:      Height:       SpO2: 95 % O2 Flow Rate (L/min): 2 L/min   Intake/Output Summary (Last 24 hours) at 09/02/2022 0923 Last data filed at 09/02/2022 0600 Gross per 24 hour  Intake 39.33 ml  Output 1470 ml  Net -1430.67 ml    Filed Weights   08/29/22 0324 08/29/22 0930  Weight: 73.3 kg 74.9 kg    Exam: General exam: In no acute distress. Respiratory system: Good air movement and clear to auscultation. Cardiovascular system: S1 & S2 heard, RRR. No JVD. Gastrointestinal system: Abdomen is nondistended, soft and nontender.  Extremities: No pedal edema. Skin: No rashes, lesions or ulcers Psychiatry: Judgement and insight appear normal. Mood & affect appropriate.    Data Reviewed:    Labs: Basic Metabolic Panel: Recent Labs  Lab 08/29/22 2150 08/30/22 0911 08/31/22 0615 08/31/22 1559 09/01/22 0615 09/02/22 0318  NA 138 137 136 139 137 134*  K 2.7* 4.3 2.7* 3.7 3.0* 3.7  CL 110 109 102 102 103 100  CO2 '23 23 28 29 29 27  '$ GLUCOSE 118* 111* 102* 136* 95 102*  BUN '15 10 11 10 9 '$ 7*  CREATININE 0.76 0.62 0.69 0.72 0.72 0.73  CALCIUM 7.9* 7.5* 7.6* 8.0* 8.0* 8.5*  MG 1.4* 2.3 1.7  --  1.7 2.1    GFR Estimated Creatinine Clearance: 47.8 mL/min (by C-G formula based on SCr of 0.73 mg/dL). Liver Function Tests: Recent Labs  Lab 08/29/22 5102 08/30/22 0911 08/31/22 0615 09/01/22 0615 09/02/22 0318  AST 24 34 '25 21 19  '$ ALT '14 22 22 20 18  '$ ALKPHOS 26* 32* 35* 37* 45  BILITOT 0.5 0.7 0.7 0.7 0.7  PROT 4.0* 4.3* 4.3* 4.7* 5.0*   ALBUMIN 2.3* 2.5* 2.4* 2.7* 2.8*    Recent Labs  Lab 08/27/22 0606  LIPASE 31    No results for input(s): "AMMONIA" in the last 168 hours. Coagulation profile Recent Labs  Lab 08/27/22 0606  INR 1.1    COVID-19 Labs  No results for input(s): "DDIMER", "FERRITIN", "LDH", "CRP" in the last 72 hours.  Lab Results  Component Value Date   SARSCOV2NAA NEGATIVE 06/03/2022   SARSCOV2NAA NEGATIVE 07/21/2020   [place] NEGATIVE 07/01/2020   [place] NEGATIVE 06/21/2020    CBC: Recent Labs  Lab 08/30/22 1757 08/31/22 0615 08/31/22 1559 09/01/22 0615 09/01/22 1800  WBC 9.7 8.6 9.4 8.0 9.4  NEUTROABS 7.1 6.1 6.5 5.4 6.3  HGB 7.4* 7.1* 9.1*  9.1* 10.1*  HCT 22.2* 21.1* 26.8* 28.5* 30.6*  MCV 91.4 93.4 90.5 93.1 92.4  PLT 114* 112* 133* 134* 150    Cardiac Enzymes: No results for input(s): "CKTOTAL", "CKMB", "CKMBINDEX", "TROPONINI" in the last 168 hours. BNP (last 3 results) No results for input(s): "PROBNP" in the last 8760 hours. CBG: Recent Labs  Lab 08/29/22 2301 08/30/22 1137 08/30/22 2303 08/31/22 1141 09/01/22 1130  GLUCAP 112* 121* 102* 101* 94    D-Dimer: No results for input(s): "DDIMER" in the last 72 hours. Hgb A1c: No results for input(s): "HGBA1C" in the last 72 hours. Lipid Profile: No results for input(s): "CHOL", "HDL", "LDLCALC", "TRIG", "CHOLHDL", "LDLDIRECT" in the last 72 hours. Thyroid function studies: No results for input(s): "TSH", "T4TOTAL", "T3FREE", "THYROIDAB" in the last 72 hours.  Invalid input(s): "FREET3" Anemia work up: No results for input(s): "VITAMINB12", "FOLATE", "FERRITIN", "TIBC", "IRON", "RETICCTPCT" in the last 72 hours. Sepsis Labs: Recent Labs  Lab 08/31/22 0615 08/31/22 1559 09/01/22 0615 09/01/22 1800  WBC 8.6 9.4 8.0 9.4    Microbiology Recent Results (from the past 240 hour(s))  MRSA Next Gen by PCR, Nasal     Status: None   Collection Time: 08/27/22  3:38 PM   Specimen: Nasal Mucosa; Nasal  Swab  Result Value Ref Range Status   MRSA by PCR Next Gen NOT DETECTED NOT DETECTED Final    Comment: (NOTE) The GeneXpert MRSA Assay (FDA approved for NASAL specimens only), is one component of a comprehensive MRSA colonization surveillance program. It is not intended to diagnose MRSA infection nor to guide or monitor treatment for MRSA infections. Test performance is not FDA approved in patients less than 30 years old. Performed at [facility], [address] [place], [place] [address]      Medications:    sodium chloride   Intravenous Once   ARIPiprazole  2 mg Oral Daily   busPIRone  10 mg Oral BID   Chlorhexidine Gluconate Cloth  6 each Topical Daily   gabapentin  400 mg Oral BID   loratadine  10 mg Oral Daily   memantine  10 mg Oral BID   pantoprazole  40 mg Oral BID   primidone  100 mg Oral Daily   sodium chloride flush  10-40 mL Intracatheter Q12H   sucralfate  1 g Oral Q6H   venlafaxine XR  150 mg Oral Q breakfast   Continuous Infusions:  sodium chloride Stopped (08/29/22 1639)      LOS: 5 days   Charlynne Cousins  [facility]  09/02/2022, 9:23 AM

## 2022-09-03 ENCOUNTER — Other Ambulatory Visit (HOSPITAL_COMMUNITY): Payer: Self-pay

## 2022-09-03 DIAGNOSIS — G3184 Mild cognitive impairment, so stated: Secondary | ICD-10-CM | POA: Diagnosis not present

## 2022-09-03 DIAGNOSIS — D62 Acute posthemorrhagic anemia: Secondary | ICD-10-CM | POA: Diagnosis not present

## 2022-09-03 DIAGNOSIS — K264 Chronic or unspecified duodenal ulcer with hemorrhage: Secondary | ICD-10-CM | POA: Diagnosis not present

## 2022-09-03 DIAGNOSIS — I10 Essential (primary) hypertension: Secondary | ICD-10-CM | POA: Diagnosis not present

## 2022-09-03 MED ORDER — SUCRALFATE 1 GM/10ML PO SUSP: 1.0000 g | Freq: Four times a day (QID) | ORAL | 0 refills | Status: DC

## 2022-09-03 MED ORDER — PANTOPRAZOLE SODIUM 40 MG PO TBEC
40.0000 mg | DELAYED_RELEASE_TABLET | Freq: Two times a day (BID) | ORAL | 3 refills | Status: DC
  Filled 2022-09-03: qty 60, 30d supply, fill #0

## 2022-09-03 MED ORDER — PANTOPRAZOLE SODIUM 40 MG PO TBEC: 40.0000 mg | DELAYED_RELEASE_TABLET | Freq: Two times a day (BID) | ORAL | 3 refills | Status: DC

## 2022-09-03 MED ORDER — METOPROLOL TARTRATE 25 MG PO TABS
12.5000 mg | ORAL_TABLET | Freq: Two times a day (BID) | ORAL | 0 refills | Status: DC
  Filled 2022-09-03: qty 30, 30d supply, fill #0

## 2022-09-03 MED ORDER — SUCRALFATE 1 GM/10ML PO SUSP
1.0000 g | Freq: Four times a day (QID) | ORAL | 0 refills | Status: DC
Start: 2022-09-03 — End: 2022-09-03
  Filled 2022-09-03: qty 420, 11d supply, fill #0

## 2022-09-03 MED ORDER — METOPROLOL TARTRATE 25 MG PO TABS: 12.5000 mg | ORAL_TABLET | Freq: Two times a day (BID) | ORAL | 0 refills | Status: DC

## 2022-09-03 NOTE — Discharge Summary (Signed)
Physician Discharge Summary  Kerissa Coia XVQ:[id number] DOB: 1934-05-11 DOA: 08/28/2022  PCP: Lesleigh Noe, MD  Admit date: 08/28/2022 Discharge date: 09/03/2022  Admitted From: Home Disposition:  home  Recommendations for Outpatient Follow-up:  Follow up with PCP in 1-2 weeks Please obtain BMP/CBC in one week She will also follow-up with GI in 2 to 4 weeks check a CBC then.  To monitor hemoglobin.  Home Health:Yes Equipment/Devices:None  Discharge Condition:Stable CODE STATUS:DNR Diet recommendation: Heart Healthy   Brief/Interim Summary: 88 y.o. female past medical history of recurrent GI bleed, occipital neuralgias essential tremors, comes in with recurrent GI bleed, hemorrhagic shock hemoglobin of 6 and A-fib with RVR, admitted to [facility] for coffee-ground emesis and paramedical pain EGD was done that showed 2 duodenal ulcers both visible bleeding vessels cauterization was unsuccessful transferred to [facility] for angiography with no contrast extravasation, empiric coiling of GDA, status post several units of packed red blood cells started on IV PPI GI was consulted. She is a poor surgical candidate per surgical teams.  Surgical resident had a conversation with daughter and will transfer to comfort care if apparent that will not survive without surgery. Status post GAD embolization by IR, they recommended no further intervention. GI relates that the lesions are not amenable for repeat endoscopy or interventional if the patient continues to have recurrent large-volume hemorrhage and instability will have to move towards comfort measures.   Significant Hospital Events: Including procedures, antibiotic start and stop dates in addition to other pertinent events   9/1 admitted to [facility] with active duodenal ulcer.  9/3 IR GDA embolization [facility] 9/3 concern for recurrent bleeding; EGD with nonbleeding duodenal ulcers   Discharge Diagnoses:  Principal Problem:   Acute blood loss anemia  (ABLA) Active Problems:   GI bleed   Essential hypertension   Mild cognitive impairment with memory loss   Duodenal ulcer with hemorrhage  Acute blood loss anemia/hemorrhagic shock from bleeding duodenal ulcer status post embolization by IR: GI was consulted perform endoscopy which cannot stop bleeding she was transfused 2 units packed red blood cells on 08/29/2022. Started on IV Protonix and IV octreotide. General surgery was consulted and they deemed a poor candidate for surgical intervention there a long discussion with her daughter and if there is no improvement in suicidal bleeding she was determined to move towards comfort care. IR was consulted to perform embolization. Her diet was advanced which she tolerated. GI related she is not amenable for repeat endoscopy she was kept on IV Protonix and IV octreotide for 72 hours. Her hemoglobin started to improve. Her diet was advanced and she was able for discharge.  Chronic atrial fibrillation and RVR: In the setting of massive GI bleed not a candidate for anticoagulation history of tachybradycardia syndrome with a pacemaker in place. She was started on metoprolol which had controlled her rate.  Peripheral edema: He was started on IV Lasix she was negative about 4 L blood pressure remained stable.  Moderate protein caloric malnutrition: Noted  Hypokalemia and hypomagnesemia: There were repleted now resolved.  Anxiety/depression/mild cognitive impairment : no changes made to her Abilify, BuSpar, memantine, Primatene and Effexor.  Discharge Instructions  Discharge Instructions     Diet - low sodium heart healthy   Complete by: As directed    Increase activity slowly   Complete by: As directed       Allergies as of 09/03/2022       Reactions   Adhesive [tape] Other (See Comments)   Contact  dermatitis   Baclofen Rash   Lyrica [pregabalin] Other (See Comments)   Unable to remember reaction.   Sulfa Antibiotics Rash         Medication List     STOP taking these medications    Advil Multi-Symptom Cold & Flu 4-10-200 MG Tabs Generic drug: Chlorphen-Phenyleph-Ibuprofen   amLODipine 2.5 MG tablet Commonly known as: NORVASC   aspirin EC 81 MG tablet   aspirin-acetaminophen-caffeine 250-250-65 MG tablet Commonly known as: EXCEDRIN MIGRAINE   cetirizine 10 MG tablet Commonly known as: ZYRTEC       TAKE these medications    acetaminophen 500 MG tablet Commonly known as: TYLENOL Take 1,000 mg by mouth 2 (two) times daily.   ARIPiprazole 2 MG tablet Commonly known as: ABILIFY Take 2 mg by mouth daily.   atorvastatin 10 MG tablet Commonly known as: LIPITOR Take 10 mg by mouth daily.   busPIRone 10 MG tablet Commonly known as: BUSPAR Take 10 mg by mouth 2 (two) times daily.   cholecalciferol 25 MCG (1000 UNIT) tablet Commonly known as: VITAMIN D3 Take 1,000 Units by mouth daily.   docusate sodium 100 MG capsule Commonly known as: COLACE Take 100 mg by mouth daily as needed for mild constipation.   Effexor XR 150 MG 24 hr capsule Generic drug: venlafaxine XR Take 150 mg by mouth daily with breakfast.   gabapentin 400 MG capsule Commonly known as: NEURONTIN Take 400 mg by mouth 2 (two) times daily.   memantine 10 MG tablet Commonly known as: NAMENDA Take 10 mg by mouth 2 (two) times daily.   metoprolol tartrate 25 MG tablet Commonly known as: LOPRESSOR Take 0.5 tablets (12.5 mg total) by mouth 2 (two) times daily.   pantoprazole 40 MG tablet Commonly known as: PROTONIX Take 1 tablet (40 mg total) by mouth 2 (two) times daily.   primidone 50 MG tablet Commonly known as: MYSOLINE Take 100 mg by mouth daily.   sucralfate 1 GM/10ML suspension Commonly known as: CARAFATE Take 10 mLs (1 g total) by mouth every 6 (six) hours.   vitamin B-12 500 MCG tablet Commonly known as: CYANOCOBALAMIN Take 500 mcg by mouth daily.        Allergies  Allergen Reactions   Adhesive  [Tape] Other (See Comments)    Contact dermatitis   Baclofen Rash   Lyrica [Pregabalin] Other (See Comments)    Unable to remember reaction.   Sulfa Antibiotics Rash    Consultations: Gastroenterology General surgery Pulmonary and critical care   Procedures/Studies: IR EMBO ART  VEN HEMORR LYMPH EXTRAV  INC GUIDE ROADMAPPING  Result Date: 08/31/2022 INDICATION: Upper GI bleed.  Active extravasation from a duodenal ulcer. EXAM: Procedures: 1. AORTOGRAM 2. CELIAC and SUPERIOR MESENTERIC ARTERIOGRAPHY 3. COIL EMBOLIZATION of GASTRODUODENAL ARTERY COMPARISON:  CTA abdomen pelvis, 08/28/2022. MEDICATIONS: None ANESTHESIA/SEDATION: Moderate (conscious) sedation was employed during this procedure. A total of Versed 3 mg and Fentanyl 150 mcg was administered intravenously. Moderate Sedation Time: 110 minutes. The patient's level of consciousness and vital signs were monitored continuously by radiology nursing throughout the procedure under my direct supervision. CONTRAST:  50 mL Omnipaque 300 FLUOROSCOPY TIME:  Fluoroscopic dose; 259.5 mGy COMPLICATIONS: None immediate. PROCEDURE: Informed consent was obtained from the patient and/or patient's representative following explanation of the procedure, risks, benefits and alternatives. All questions were addressed. A time out was performed prior to the initiation of the procedure. Maximal barrier sterile technique utilized including caps, mask, sterile gowns, sterile gloves, large sterile drape,  hand hygiene, and chlorhexidine prep. At the RIGHT groin 1% lidocaine was used for local anesthesia. Limited ultrasound imaging of the groin shows the RIGHT femoral artery to be patent. An ultrasound image was saved and sent to PACS. Arterial access was obtained with a 21-G, 7-cm needle under direct ultrasound guidance, through which a 0.018-inch guidewire was advanced under fluoroscopy. The 21-G needle was then exchanged for a micropuncture catheter and a 0.035-inch  Bentson guidewire. The micropuncture catheter was exchanged for a 5-F sheath. A 5 Fr cobra 2 catheter was used to catheterize the SMA through which a digital subtraction angiogram (DSA) was obtained. No intervention was performed at this level. After multiple attempts at cannulating the celiac axis, a 5 Fr omni Flush catheter was positioned at the diaphragmatic hiatus and an aortogram was performed. Using a 5 Fr Mickelson catheter, the celiac access was cannulated and access to the gastroduodenal artery was obtained using a 2.5 Fr, 35 cm STC microcatheter and 0.014 inch Fathom microwire, through which subselective DSA was performed. PURPOSE OF THE ARTERIOGRAM: No previous catheter-directed angiogram was available. Therefore a new complete diagnostic angiography was performed. The decision to proceed with an interventional procedure was made based on this new diagnostic angiogram. No active extravasation was noted at the duodenum. Empiric coil embolization of the GDA was performed using the exclusion technique to the level of the common hepatic artery bifurcation. A final angiogram was performed. Images were reviewed and the procedure was terminated. All wires, catheters and sheaths were removed from the patient. Hemostasis was achieved at the RIGHT groin access site with Angio-Seal closure. The patient tolerated the procedure well without immediate post procedural complication. FINDINGS: 1. Senescent tortuous abdominal aorta with a stenosed celiac artery origin. 2. Variant mesenteric arterial anatomy with a replaced RIGHT hepatic artery from the SMA. 3. Celiac and selective GDA arteriogram without evidence of active extravasation at the duodenum. 4. Empiric coil embolization of the GDA by exclusion technique. 5. Aneurysmal closure at the RIGHT groin with distal RIGHT lower extremity pulses of the end of the case. IMPRESSION: Successful empiric coil embolization of the gastroduodenal artery for upper GI bleed  secondary to a duodenal ulcer, as above. PLAN: 1. Post sheath removal precautions, including RIGHT lower extremity straight x2 hours. 2. Serial examinations and H/h trend to evaluate potential ongoing upper GI bleeding. Michaelle Birks, MD [facility] Radiology Electronically Signed   By: Michaelle Birks M.D.   On: 08/31/2022 14:50   IR US Guide Vasc Access Right  Result Date: 08/31/2022 INDICATION: Upper GI bleed.  Active extravasation from a duodenal ulcer. EXAM: Procedures: 1. AORTOGRAM 2. CELIAC and SUPERIOR MESENTERIC ARTERIOGRAPHY 3. COIL EMBOLIZATION of GASTRODUODENAL ARTERY COMPARISON:  CTA abdomen pelvis, 08/28/2022. MEDICATIONS: None ANESTHESIA/SEDATION: Moderate (conscious) sedation was employed during this procedure. A total of Versed 3 mg and Fentanyl 150 mcg was administered intravenously. Moderate Sedation Time: 110 minutes. The patient's level of consciousness and vital signs were monitored continuously by radiology nursing throughout the procedure under my direct supervision. CONTRAST:  50 mL Omnipaque 300 FLUOROSCOPY TIME:  Fluoroscopic dose; 025.4 mGy COMPLICATIONS: None immediate. PROCEDURE: Informed consent was obtained from the patient and/or patient's representative following explanation of the procedure, risks, benefits and alternatives. All questions were addressed. A time out was performed prior to the initiation of the procedure. Maximal barrier sterile technique utilized including caps, mask, sterile gowns, sterile gloves, large sterile drape, hand hygiene, and chlorhexidine prep. At the RIGHT groin 1% lidocaine was  used for local anesthesia. Limited ultrasound imaging of the groin shows the RIGHT femoral artery to be patent. An ultrasound image was saved and sent to PACS. Arterial access was obtained with a 21-G, 7-cm needle under direct ultrasound guidance, through which a 0.018-inch guidewire was advanced under fluoroscopy. The 21-G needle  was then exchanged for a micropuncture catheter and a 0.035-inch Bentson guidewire. The micropuncture catheter was exchanged for a 5-F sheath. A 5 Fr cobra 2 catheter was used to catheterize the SMA through which a digital subtraction angiogram (DSA) was obtained. No intervention was performed at this level. After multiple attempts at cannulating the celiac axis, a 5 Fr omni Flush catheter was positioned at the diaphragmatic hiatus and an aortogram was performed. Using a 5 Fr Mickelson catheter, the celiac access was cannulated and access to the gastroduodenal artery was obtained using a 2.5 Fr, 35 cm STC microcatheter and 0.014 inch Fathom microwire, through which subselective DSA was performed. PURPOSE OF THE ARTERIOGRAM: No previous catheter-directed angiogram was available. Therefore a new complete diagnostic angiography was performed. The decision to proceed with an interventional procedure was made based on this new diagnostic angiogram. No active extravasation was noted at the duodenum. Empiric coil embolization of the GDA was performed using the exclusion technique to the level of the common hepatic artery bifurcation. A final angiogram was performed. Images were reviewed and the procedure was terminated. All wires, catheters and sheaths were removed from the patient. Hemostasis was achieved at the RIGHT groin access site with Angio-Seal closure. The patient tolerated the procedure well without immediate post procedural complication. FINDINGS: 1. Senescent tortuous abdominal aorta with a stenosed celiac artery origin. 2. Variant mesenteric arterial anatomy with a replaced RIGHT hepatic artery from the SMA. 3. Celiac and selective GDA arteriogram without evidence of active extravasation at the duodenum. 4. Empiric coil embolization of the GDA by exclusion technique. 5. Aneurysmal closure at the RIGHT groin with distal RIGHT lower extremity pulses of the end of the case. IMPRESSION: Successful empiric coil  embolization of the gastroduodenal artery for upper GI bleed secondary to a duodenal ulcer, as above. PLAN: 1. Post sheath removal precautions, including RIGHT lower extremity straight x2 hours. 2. Serial examinations and H/h trend to evaluate potential ongoing upper GI bleeding. Michaelle Birks, MD [facility] Radiology Electronically Signed   By: Michaelle Birks M.D.   On: 08/31/2022 14:50   IR Angiogram Visceral Selective  Result Date: 08/31/2022 INDICATION: Upper GI bleed.  Active extravasation from a duodenal ulcer. EXAM: Procedures: 1. AORTOGRAM 2. CELIAC and SUPERIOR MESENTERIC ARTERIOGRAPHY 3. COIL EMBOLIZATION of GASTRODUODENAL ARTERY COMPARISON:  CTA abdomen pelvis, 08/28/2022. MEDICATIONS: None ANESTHESIA/SEDATION: Moderate (conscious) sedation was employed during this procedure. A total of Versed 3 mg and Fentanyl 150 mcg was administered intravenously. Moderate Sedation Time: 110 minutes. The patient's level of consciousness and vital signs were monitored continuously by radiology nursing throughout the procedure under my direct supervision. CONTRAST:  50 mL Omnipaque 300 FLUOROSCOPY TIME:  Fluoroscopic dose; 735.3 mGy COMPLICATIONS: None immediate. PROCEDURE: Informed consent was obtained from the patient and/or patient's representative following explanation of the procedure, risks, benefits and alternatives. All questions were addressed. A time out was performed prior to the initiation of the procedure. Maximal barrier sterile technique utilized including caps, mask, sterile gowns, sterile gloves, large sterile drape, hand hygiene, and chlorhexidine prep. At the RIGHT groin 1% lidocaine was used for local anesthesia. Limited ultrasound imaging of the groin shows the RIGHT femoral  artery to be patent. An ultrasound image was saved and sent to PACS. Arterial access was obtained with a 21-G, 7-cm needle under direct ultrasound guidance, through which a  0.018-inch guidewire was advanced under fluoroscopy. The 21-G needle was then exchanged for a micropuncture catheter and a 0.035-inch Bentson guidewire. The micropuncture catheter was exchanged for a 5-F sheath. A 5 Fr cobra 2 catheter was used to catheterize the SMA through which a digital subtraction angiogram (DSA) was obtained. No intervention was performed at this level. After multiple attempts at cannulating the celiac axis, a 5 Fr omni Flush catheter was positioned at the diaphragmatic hiatus and an aortogram was performed. Using a 5 Fr Mickelson catheter, the celiac access was cannulated and access to the gastroduodenal artery was obtained using a 2.5 Fr, 35 cm STC microcatheter and 0.014 inch Fathom microwire, through which subselective DSA was performed. PURPOSE OF THE ARTERIOGRAM: No previous catheter-directed angiogram was available. Therefore a new complete diagnostic angiography was performed. The decision to proceed with an interventional procedure was made based on this new diagnostic angiogram. No active extravasation was noted at the duodenum. Empiric coil embolization of the GDA was performed using the exclusion technique to the level of the common hepatic artery bifurcation. A final angiogram was performed. Images were reviewed and the procedure was terminated. All wires, catheters and sheaths were removed from the patient. Hemostasis was achieved at the RIGHT groin access site with Angio-Seal closure. The patient tolerated the procedure well without immediate post procedural complication. FINDINGS: 1. Senescent tortuous abdominal aorta with a stenosed celiac artery origin. 2. Variant mesenteric arterial anatomy with a replaced RIGHT hepatic artery from the SMA. 3. Celiac and selective GDA arteriogram without evidence of active extravasation at the duodenum. 4. Empiric coil embolization of the GDA by exclusion technique. 5. Aneurysmal closure at the RIGHT groin with distal RIGHT lower extremity  pulses of the end of the case. IMPRESSION: Successful empiric coil embolization of the gastroduodenal artery for upper GI bleed secondary to a duodenal ulcer, as above. PLAN: 1. Post sheath removal precautions, including RIGHT lower extremity straight x2 hours. 2. Serial examinations and H/h trend to evaluate potential ongoing upper GI bleeding. Michaelle Birks, MD [facility] Radiology Electronically Signed   By: Michaelle Birks M.D.   On: 08/31/2022 14:50   IR Angiogram Visceral Selective  Result Date: 08/31/2022 INDICATION: Upper GI bleed.  Active extravasation from a duodenal ulcer. EXAM: Procedures: 1. AORTOGRAM 2. CELIAC and SUPERIOR MESENTERIC ARTERIOGRAPHY 3. COIL EMBOLIZATION of GASTRODUODENAL ARTERY COMPARISON:  CTA abdomen pelvis, 08/28/2022. MEDICATIONS: None ANESTHESIA/SEDATION: Moderate (conscious) sedation was employed during this procedure. A total of Versed 3 mg and Fentanyl 150 mcg was administered intravenously. Moderate Sedation Time: 110 minutes. The patient's level of consciousness and vital signs were monitored continuously by radiology nursing throughout the procedure under my direct supervision. CONTRAST:  50 mL Omnipaque 300 FLUOROSCOPY TIME:  Fluoroscopic dose; 500.9 mGy COMPLICATIONS: None immediate. PROCEDURE: Informed consent was obtained from the patient and/or patient's representative following explanation of the procedure, risks, benefits and alternatives. All questions were addressed. A time out was performed prior to the initiation of the procedure. Maximal barrier sterile technique utilized including caps, mask, sterile gowns, sterile gloves, large sterile drape, hand hygiene, and chlorhexidine prep. At the RIGHT groin 1% lidocaine was used for local anesthesia. Limited ultrasound imaging of the groin shows the RIGHT femoral artery to be patent. An ultrasound image was saved and sent to PACS. Arterial  access was obtained with a 21-G,  7-cm needle under direct ultrasound guidance, through which a 0.018-inch guidewire was advanced under fluoroscopy. The 21-G needle was then exchanged for a micropuncture catheter and a 0.035-inch Bentson guidewire. The micropuncture catheter was exchanged for a 5-F sheath. A 5 Fr cobra 2 catheter was used to catheterize the SMA through which a digital subtraction angiogram (DSA) was obtained. No intervention was performed at this level. After multiple attempts at cannulating the celiac axis, a 5 Fr omni Flush catheter was positioned at the diaphragmatic hiatus and an aortogram was performed. Using a 5 Fr Mickelson catheter, the celiac access was cannulated and access to the gastroduodenal artery was obtained using a 2.5 Fr, 35 cm STC microcatheter and 0.014 inch Fathom microwire, through which subselective DSA was performed. PURPOSE OF THE ARTERIOGRAM: No previous catheter-directed angiogram was available. Therefore a new complete diagnostic angiography was performed. The decision to proceed with an interventional procedure was made based on this new diagnostic angiogram. No active extravasation was noted at the duodenum. Empiric coil embolization of the GDA was performed using the exclusion technique to the level of the common hepatic artery bifurcation. A final angiogram was performed. Images were reviewed and the procedure was terminated. All wires, catheters and sheaths were removed from the patient. Hemostasis was achieved at the RIGHT groin access site with Angio-Seal closure. The patient tolerated the procedure well without immediate post procedural complication. FINDINGS: 1. Senescent tortuous abdominal aorta with a stenosed celiac artery origin. 2. Variant mesenteric arterial anatomy with a replaced RIGHT hepatic artery from the SMA. 3. Celiac and selective GDA arteriogram without evidence of active extravasation at the duodenum. 4. Empiric coil embolization of the GDA by exclusion technique. 5. Aneurysmal  closure at the RIGHT groin with distal RIGHT lower extremity pulses of the end of the case. IMPRESSION: Successful empiric coil embolization of the gastroduodenal artery for upper GI bleed secondary to a duodenal ulcer, as above. PLAN: 1. Post sheath removal precautions, including RIGHT lower extremity straight x2 hours. 2. Serial examinations and H/h trend to evaluate potential ongoing upper GI bleeding. Michaelle Birks, MD [facility] Radiology Electronically Signed   By: Michaelle Birks M.D.   On: 08/31/2022 14:50   IR Angiogram Visceral Selective  Result Date: 08/31/2022 INDICATION: Upper GI bleed.  Active extravasation from a duodenal ulcer. EXAM: Procedures: 1. AORTOGRAM 2. CELIAC and SUPERIOR MESENTERIC ARTERIOGRAPHY 3. COIL EMBOLIZATION of GASTRODUODENAL ARTERY COMPARISON:  CTA abdomen pelvis, 08/28/2022. MEDICATIONS: None ANESTHESIA/SEDATION: Moderate (conscious) sedation was employed during this procedure. A total of Versed 3 mg and Fentanyl 150 mcg was administered intravenously. Moderate Sedation Time: 110 minutes. The patient's level of consciousness and vital signs were monitored continuously by radiology nursing throughout the procedure under my direct supervision. CONTRAST:  50 mL Omnipaque 300 FLUOROSCOPY TIME:  Fluoroscopic dose; 093.2 mGy COMPLICATIONS: None immediate. PROCEDURE: Informed consent was obtained from the patient and/or patient's representative following explanation of the procedure, risks, benefits and alternatives. All questions were addressed. A time out was performed prior to the initiation of the procedure. Maximal barrier sterile technique utilized including caps, mask, sterile gowns, sterile gloves, large sterile drape, hand hygiene, and chlorhexidine prep. At the RIGHT groin 1% lidocaine was used for local anesthesia. Limited ultrasound imaging of the groin shows the RIGHT femoral artery to be patent. An ultrasound image was saved  and sent to PACS. Arterial access was obtained with a 21-G, 7-cm needle under direct ultrasound guidance, through  which a 0.018-inch guidewire was advanced under fluoroscopy. The 21-G needle was then exchanged for a micropuncture catheter and a 0.035-inch Bentson guidewire. The micropuncture catheter was exchanged for a 5-F sheath. A 5 Fr cobra 2 catheter was used to catheterize the SMA through which a digital subtraction angiogram (DSA) was obtained. No intervention was performed at this level. After multiple attempts at cannulating the celiac axis, a 5 Fr omni Flush catheter was positioned at the diaphragmatic hiatus and an aortogram was performed. Using a 5 Fr Mickelson catheter, the celiac access was cannulated and access to the gastroduodenal artery was obtained using a 2.5 Fr, 35 cm STC microcatheter and 0.014 inch Fathom microwire, through which subselective DSA was performed. PURPOSE OF THE ARTERIOGRAM: No previous catheter-directed angiogram was available. Therefore a new complete diagnostic angiography was performed. The decision to proceed with an interventional procedure was made based on this new diagnostic angiogram. No active extravasation was noted at the duodenum. Empiric coil embolization of the GDA was performed using the exclusion technique to the level of the common hepatic artery bifurcation. A final angiogram was performed. Images were reviewed and the procedure was terminated. All wires, catheters and sheaths were removed from the patient. Hemostasis was achieved at the RIGHT groin access site with Angio-Seal closure. The patient tolerated the procedure well without immediate post procedural complication. FINDINGS: 1. Senescent tortuous abdominal aorta with a stenosed celiac artery origin. 2. Variant mesenteric arterial anatomy with a replaced RIGHT hepatic artery from the SMA. 3. Celiac and selective GDA arteriogram without evidence of active extravasation at the duodenum. 4. Empiric coil  embolization of the GDA by exclusion technique. 5. Aneurysmal closure at the RIGHT groin with distal RIGHT lower extremity pulses of the end of the case. IMPRESSION: Successful empiric coil embolization of the gastroduodenal artery for upper GI bleed secondary to a duodenal ulcer, as above. PLAN: 1. Post sheath removal precautions, including RIGHT lower extremity straight x2 hours. 2. Serial examinations and H/h trend to evaluate potential ongoing upper GI bleeding. Michaelle Birks, MD [facility] Radiology Electronically Signed   By: Michaelle Birks M.D.   On: 08/31/2022 14:50   IR Aortagram Abdominal Serialogram  Result Date: 08/31/2022 INDICATION: Upper GI bleed.  Active extravasation from a duodenal ulcer. EXAM: Procedures: 1. AORTOGRAM 2. CELIAC and SUPERIOR MESENTERIC ARTERIOGRAPHY 3. COIL EMBOLIZATION of GASTRODUODENAL ARTERY COMPARISON:  CTA abdomen pelvis, 08/28/2022. MEDICATIONS: None ANESTHESIA/SEDATION: Moderate (conscious) sedation was employed during this procedure. A total of Versed 3 mg and Fentanyl 150 mcg was administered intravenously. Moderate Sedation Time: 110 minutes. The patient's level of consciousness and vital signs were monitored continuously by radiology nursing throughout the procedure under my direct supervision. CONTRAST:  50 mL Omnipaque 300 FLUOROSCOPY TIME:  Fluoroscopic dose; 756.4 mGy COMPLICATIONS: None immediate. PROCEDURE: Informed consent was obtained from the patient and/or patient's representative following explanation of the procedure, risks, benefits and alternatives. All questions were addressed. A time out was performed prior to the initiation of the procedure. Maximal barrier sterile technique utilized including caps, mask, sterile gowns, sterile gloves, large sterile drape, hand hygiene, and chlorhexidine prep. At the RIGHT groin 1% lidocaine was used for local anesthesia. Limited ultrasound imaging of the groin shows the  RIGHT femoral artery to be patent. An ultrasound image was saved and sent to PACS. Arterial access was obtained with a 21-G, 7-cm needle under direct ultrasound guidance, through which a 0.018-inch guidewire was advanced under fluoroscopy. The 21-G needle was then exchanged  for a micropuncture catheter and a 0.035-inch Bentson guidewire. The micropuncture catheter was exchanged for a 5-F sheath. A 5 Fr cobra 2 catheter was used to catheterize the SMA through which a digital subtraction angiogram (DSA) was obtained. No intervention was performed at this level. After multiple attempts at cannulating the celiac axis, a 5 Fr omni Flush catheter was positioned at the diaphragmatic hiatus and an aortogram was performed. Using a 5 Fr Mickelson catheter, the celiac access was cannulated and access to the gastroduodenal artery was obtained using a 2.5 Fr, 35 cm STC microcatheter and 0.014 inch Fathom microwire, through which subselective DSA was performed. PURPOSE OF THE ARTERIOGRAM: No previous catheter-directed angiogram was available. Therefore a new complete diagnostic angiography was performed. The decision to proceed with an interventional procedure was made based on this new diagnostic angiogram. No active extravasation was noted at the duodenum. Empiric coil embolization of the GDA was performed using the exclusion technique to the level of the common hepatic artery bifurcation. A final angiogram was performed. Images were reviewed and the procedure was terminated. All wires, catheters and sheaths were removed from the patient. Hemostasis was achieved at the RIGHT groin access site with Angio-Seal closure. The patient tolerated the procedure well without immediate post procedural complication. FINDINGS: 1. Senescent tortuous abdominal aorta with a stenosed celiac artery origin. 2. Variant mesenteric arterial anatomy with a replaced RIGHT hepatic artery from the SMA. 3. Celiac and selective GDA arteriogram without  evidence of active extravasation at the duodenum. 4. Empiric coil embolization of the GDA by exclusion technique. 5. Aneurysmal closure at the RIGHT groin with distal RIGHT lower extremity pulses of the end of the case. IMPRESSION: Successful empiric coil embolization of the gastroduodenal artery for upper GI bleed secondary to a duodenal ulcer, as above. PLAN: 1. Post sheath removal precautions, including RIGHT lower extremity straight x2 hours. 2. Serial examinations and H/h trend to evaluate potential ongoing upper GI bleeding. Michaelle Birks, MD [facility] Radiology Electronically Signed   By: Michaelle Birks M.D.   On: 08/31/2022 14:50   [place] EKG SITE RITE  Result Date: 08/29/2022 If Site Rite image not attached, placement could not be confirmed due to current cardiac rhythm.  CT ANGIO GI BLEED  Result Date: 08/28/2022 CLINICAL DATA:  Duodenal ulcer, gastrointestinal hemorrhage, coffee-ground emesis EXAM: CTA ABDOMEN AND PELVIS WITHOUT AND WITH CONTRAST TECHNIQUE: Multidetector CT imaging of the abdomen and pelvis was performed using the standard protocol during bolus administration of intravenous contrast. Multiplanar reconstructed images and MIPs were obtained and reviewed to evaluate the vascular anatomy. RADIATION DOSE REDUCTION: This exam was performed according to the departmental dose-optimization program which includes automated exposure control, adjustment of the mA and/or kV according to patient size and/or use of iterative reconstruction technique. CONTRAST:  112m OMNIPAQUE IOHEXOL 350 MG/ML SOLN COMPARISON:  None Available. FINDINGS: VASCULAR Aorta: Normal caliber aorta without aneurysm, dissection, vasculitis or significant stenosis. Mild atherosclerotic calcification Celiac: Mild mass effect related to the median arcuate ligament without hemodynamically significant stenosis. Variant anatomy with the common hepatic artery giving rise to the  gastroduodenal artery and left hepatic artery. Accessory left hepatic artery noted. No aneurysm or dissection. SMA: Widely patent. Replaced right hepatic artery. No aneurysm or dissection. Renals: Dual right and single left renal arteries. Superior most right renal artery demonstrates a high-grade (greater than 75%) stenosis at its origin. Additionally, the vessel demonstrates a beaded appearance within its mid segment in keeping with changes of fibromuscular  dysplasia. Remaining renal arteries are widely patent and demonstrate normal vascular morphology. No aneurysm or dissection. IMA: Diminutive, but patent. Inflow: Patent without evidence of aneurysm, dissection, vasculitis or significant stenosis. Proximal Outflow: Bilateral common femoral and visualized portions of the superficial and profunda femoral arteries are patent without evidence of aneurysm, dissection, vasculitis or significant stenosis. Veins: Unremarkable. Review of the MIP images confirms the above findings. NON-VASCULAR Lower chest: Visualized lung bases are clear bilaterally. Pacemaker leads are seen within the right heart. Small hiatal hernia. Hepatobiliary: No focal liver abnormality is seen. Status post cholecystectomy. No biliary dilatation. Pancreas: 14 mm simple cyst noted within the body of the pancreas demonstrating no enhancement or internal architecture. The pancreas is otherwise unremarkable. Spleen: Unremarkable Adrenals/Urinary Tract: Bilateral benign adrenal adenomas measuring 13 mm in greatest dimension. The kidneys are normal in size and position. No hydronephrosis. No intrarenal or ureteral calculi. The bladder is unremarkable. Stomach/Bowel: There is active extravasation involving the second portion of the duodenum just above the ampulla. This is best seen on coronal image # 57, series 9. Surgical changes of right hemicolectomy are identified. The stomach, small bowel, and large bowel are otherwise unremarkable. No free  intraperitoneal gas or fluid. Lymphatic: No pathologic adenopathy within the abdomen and pelvis. Reproductive: Status post hysterectomy. No adnexal masses. Other: No abdominal wall hernia Musculoskeletal: No acute bone abnormality. Osseous structures are age-appropriate. IMPRESSION: Active extravasation involving second portion of the duodenum. Fibromuscular dysplasia involving a superior accessory right renal artery. Superimposed hemodynamically significant stenosis this vessel origin 14 mm simple cyst within the body of the pancreas. No enhancing intrapancreatic mass identified. Benign bilateral adrenal adenoma. Aortic Atherosclerosis (ICD10-I70.0). These results were called by telephone at the time of interpretation on 08/28/2022 at 9:43 pm to provider Sharion Settler, NP, who verbally acknowledged these results. Electronically Signed   By: Fidela Salisbury M.D.   On: 08/28/2022 21:44   DG Abd Portable 2V  Result Date: 08/27/2022 CLINICAL DATA:  Abdominal pain EXAM: PORTABLE ABDOMEN - 2 VIEW COMPARISON:  None Available. FINDINGS: Multiple gas-filled loops of large and small bowel. No abnormal distension. Gas in the rectum. No intraperitoneal free air. Degenerative change of the lumbar spine with dextroscoliosis. No pathologic calcifications. IMPRESSION: 1. Gas filled colon and small bowel without evidence obstruction. No pathologic dilatation. 2. No free air. Electronically Signed   By: Suzy Bouchard M.D.   On: 08/27/2022 14:03   (Echo, Carotid, EGD, Colonoscopy, ERCP)    Subjective: No complaints  Discharge Exam: Vitals:   09/03/22 0800 09/03/22 1134  BP: 109/62 (!) 148/82  Pulse: 89 88  Resp: 19 17  Temp: 98.1 F (36.7 C) 97.8 F (36.6 C)  SpO2: 96% 98%   Vitals:   09/02/22 2301 09/03/22 0301 09/03/22 0800 09/03/22 1134  BP: 99/69 97/60 109/62 (!) 148/82  Pulse: 78 73 89 88  Resp: '19 20 19 17  '$ Temp: 97.8 F (36.6 C) 97.8 F (36.6 C) 98.1 F (36.7 C) 97.8 F (36.6 C)  TempSrc: Oral  Oral Oral Oral  SpO2: 96% 95% 96% 98%  Weight:      Height:        General: Pt is alert, awake, not in acute distress Cardiovascular: RRR, S1/S2 +, no rubs, no gallops Respiratory: CTA bilaterally, no wheezing, no rhonchi Abdominal: Soft, NT, ND, bowel sounds + Extremities: no edema, no cyanosis    The results of significant diagnostics from this hospitalization (including imaging, microbiology, ancillary and laboratory) are listed below for reference.  Microbiology: Recent Results (from the past 240 hour(s))  MRSA Next Gen by PCR, Nasal     Status: None   Collection Time: 08/27/22  3:38 PM   Specimen: Nasal Mucosa; Nasal Swab  Result Value Ref Range Status   MRSA by PCR Next Gen NOT DETECTED NOT DETECTED Final    Comment: (NOTE) The GeneXpert MRSA Assay (FDA approved for NASAL specimens only), is one component of a comprehensive MRSA colonization surveillance program. It is not intended to diagnose MRSA infection nor to guide or monitor treatment for MRSA infections. Test performance is not FDA approved in patients less than 42 years old. Performed at [facility] [place], [address] [place], [place] [address]      Labs: BNP (last 3 results) No results for input(s): "BNP" in the last 8760 hours. Basic Metabolic Panel: Recent Labs  Lab 08/29/22 2150 08/30/22 0911 08/31/22 0615 08/31/22 1559 09/01/22 0615 09/02/22 0318  NA 138 137 136 139 137 134*  K 2.7* 4.3 2.7* 3.7 3.0* 3.7  CL 110 109 102 102 103 100  CO2 '23 23 28 29 29 27  '$ GLUCOSE 118* 111* 102* 136* 95 102*  BUN '15 10 11 10 9 '$ 7*  CREATININE 0.76 0.62 0.69 0.72 0.72 0.73  CALCIUM 7.9* 7.5* 7.6* 8.0* 8.0* 8.5*  MG 1.4* 2.3 1.7  --  1.7 2.1   Liver Function Tests: Recent Labs  Lab 08/29/22 0635 08/30/22 0911 08/31/22 0615 09/01/22 0615 09/02/22 0318  AST 24 34 '25 21 19  '$ ALT '14 22 22 20 18  '$ ALKPHOS 26* 32* 35* 37* 45  BILITOT 0.5 0.7 0.7 0.7 0.7  PROT 4.0* 4.3* 4.3* 4.7* 5.0*  ALBUMIN  2.3* 2.5* 2.4* 2.7* 2.8*   No results for input(s): "LIPASE", "AMYLASE" in the last 168 hours. No results for input(s): "AMMONIA" in the last 168 hours. CBC: Recent Labs  Lab 08/30/22 1757 08/31/22 0615 08/31/22 1559 09/01/22 0615 09/01/22 1800  WBC 9.7 8.6 9.4 8.0 9.4  NEUTROABS 7.1 6.1 6.5 5.4 6.3  HGB 7.4* 7.1* 9.1* 9.1* 10.1*  HCT 22.2* 21.1* 26.8* 28.5* 30.6*  MCV 91.4 93.4 90.5 93.1 92.4  PLT 114* 112* 133* 134* 150   Cardiac Enzymes: No results for input(s): "CKTOTAL", "CKMB", "CKMBINDEX", "TROPONINI" in the last 168 hours. BNP: Invalid input(s): "POCBNP" CBG: Recent Labs  Lab 08/29/22 2301 08/30/22 1137 08/30/22 2303 08/31/22 1141 09/01/22 1130  GLUCAP 112* 121* 102* 101* 94   D-Dimer No results for input(s): "DDIMER" in the last 72 hours. Hgb A1c No results for input(s): "HGBA1C" in the last 72 hours. Lipid Profile No results for input(s): "CHOL", "HDL", "LDLCALC", "TRIG", "CHOLHDL", "LDLDIRECT" in the last 72 hours. Thyroid function studies No results for input(s): "TSH", "T4TOTAL", "T3FREE", "THYROIDAB" in the last 72 hours.  Invalid input(s): "FREET3" Anemia work up No results for input(s): "VITAMINB12", "FOLATE", "FERRITIN", "TIBC", "IRON", "RETICCTPCT" in the last 72 hours. Urinalysis    Component Value Date/Time   COLORURINE YELLOW (A) 06/02/2022 1921   APPEARANCEUR Clear 08/16/2022 0947   LABSPEC 1.013 06/02/2022 1921   PHURINE 6.0 06/02/2022 1921   GLUCOSEU Negative 08/16/2022 0947   HGBUR NEGATIVE 06/02/2022 1921   BILIRUBINUR Negative 08/16/2022 [place] 06/02/2022 1921   PROTEINUR Negative 08/16/2022 0947   PROTEINUR NEGATIVE 06/02/2022 1921   NITRITE Negative 08/16/2022 0947   NITRITE NEGATIVE 06/02/2022 1921   LEUKOCYTESUR Negative 08/16/2022 0947   LEUKOCYTESUR NEGATIVE 06/02/2022 1921   Sepsis Labs Recent Labs  Lab 08/31/22  1840 08/31/22 1559 09/01/22 0615 09/01/22 1800  WBC 8.6 9.4 8.0 9.4    Microbiology Recent Results (from the past 240 hour(s))  MRSA Next Gen by PCR, Nasal     Status: None   Collection Time: 08/27/22  3:38 PM   Specimen: Nasal Mucosa; Nasal Swab  Result Value Ref Range Status   MRSA by PCR Next Gen NOT DETECTED NOT DETECTED Final    Comment: (NOTE) The GeneXpert MRSA Assay (FDA approved for NASAL specimens only), is one component of a comprehensive MRSA colonization surveillance program. It is not intended to diagnose MRSA infection nor to guide or monitor treatment for MRSA infections. Test performance is not FDA approved in patients less than 109 years old. Performed at [facility], [address] [place], [place] [address]    SIGNED:   Charlynne Cousins, MD  [facility] 09/03/2022, 12:51 PM Pager   If 7PM-7AM, please contact night-coverage [email or web address] Password [email or web address]

## 2022-09-03 NOTE — Progress Notes (Signed)
This chaplain is present for F/U on the Pt. Advance Directive. The Pt. is awake and asking questions about discharge, which the chaplain is unable to answer.   The chaplain understands the Pt. daughter-Kim took the AD document home. The Pt. shares she is content in letting Kim facilitate the next steps in completing the Pt. AD.  The chaplain is available for F/U spiritual care as needed.  Chaplain Sallyanne Kuster [phone]

## 2022-09-03 NOTE — TOC Transition Note (Signed)
Transition of Care [facility]) - CM/SW Discharge Note   Patient Details  Name: Regina Hernandez MRN: [id number] Date of Birth: 1934/11/17  Transition [facility]) CM/SW Contact:  Verdell Carmine, RN Phone Number: 09/03/2022, 1:22 PM   Clinical Narrative:     Patient is being discharged today. Informed Meredeth Ide, of DC. Orders in for home health. TOC pharmacy not in network with [facility] for medications, medications will need to be picked [facility] at designated [facility].  Provider, RN aware.    Barriers to Discharge: No Barriers Identified   Patient Goals and CMS Choice Patient states their goals for this hospitalization and ongoing recovery are:: Wants to get better CMS Medicare.gov Compare Post Acute Care list provided to:: Patient Choice offered to / list presented to : Patient, Adult Children (message left for daughter per patients request)  Discharge Placement                       Discharge Plan and Services In-house Referral: NA Discharge Planning Services: CM Consult Post Acute Care Choice: Home Health          DME Arranged: N/A DME Agency: NA       HH Arranged: PT, OT, Nurse's Aide, Social Work [employer] Agency: [place] Date [place]: 09/02/22 Time [place]: [phone] Representative spoke with at [place]: [place] ([place]) Interventions     Readmission Risk Interventions    08/31/2022    1:56 PM 07/24/2020    2:41 PM  Readmission Risk Prevention Plan  Transportation Screening Complete Complete  PCP or Specialist Appt within 5-7 Days Complete   Home Care Screening Complete   Medication Review (RN CM) Referral to Pharmacy   Palliative Care Screening  Not Applicable  Medication Review (RN Care Manager)  Complete

## 2022-09-03 NOTE — Progress Notes (Signed)
Physical Therapy Treatment Patient Details Name: Regina Hernandez MRN: [id number] DOB: April 12, 1934 Today's Date: 09/03/2022   History of Present Illness Pt is an 88 year old woman admitted to [facility] on 9/1 with GIB, anemia and hemmorrhagic shock.Transferred to [facility] for embolization on 08/29/22. PMH: GIB, migraines, occipital neuralgia, essential tremor, cervical dystonia, SSS s/p pacemaker, depression, mild cognitive impairment.    PT Comments    Pt continues to make excellent progress with mobility. Ready for dc home from PT standpoint. Encouraged pt to use her walker initially at home.    Recommendations for follow up therapy are one component of a multi-disciplinary discharge planning process, led by the attending physician.  Recommendations may be updated based on patient status, additional functional criteria and insurance authorization.  Follow Up Recommendations  Home health PT     Assistance Recommended at Discharge Set up Supervision/Assistance  Patient can return home with the following A little help with bathing/dressing/bathroom;Assistance with cooking/housework;Assist for transportation   Equipment Recommendations  None recommended by PT    Recommendations for Other Services       Precautions / Restrictions Precautions Precautions: Fall     Mobility  Bed Mobility Overal bed mobility: Independent                  Transfers Overall transfer level: Needs assistance Equipment used: Rolling walker (2 wheels), None Transfers: Sit to/from Stand Sit to Stand: Supervision           General transfer comment: supervision for safety    Ambulation/Gait Ambulation/Gait assistance: Modified independent (Device/Increase time), Supervision Gait Distance (Feet): 275 Feet Assistive device: Rolling walker (2 wheels), None Gait Pattern/deviations: Step-through pattern Gait velocity: decr Gait velocity interpretation: 1.31 - 2.62 ft/sec, indicative of limited community  ambulator   General Gait Details: Modified independent with rolling walker with steady gait. Supervision with amb wtihout assistive device   Stairs             Wheelchair Mobility    Modified Rankin (Stroke Patients Only)       Balance Overall balance assessment: Needs assistance Sitting-balance support: No upper extremity supported, Feet supported Sitting balance-Leahy Scale: Normal     Standing balance support: No upper extremity supported, During functional activity Standing balance-Leahy Scale: Good                              Cognition Arousal/Alertness: Awake/alert Behavior During Therapy: WFL for tasks assessed/performed Overall Cognitive Status: Within Functional Limits for tasks assessed                                          Exercises      General Comments        Pertinent Vitals/Pain Pain Assessment Pain Assessment: No/denies pain    Home Living                          Prior Function            PT Goals (current goals can now be found in the care plan section) Acute Rehab PT Goals Patient Stated Goal: home independent Progress towards PT goals: Progressing toward goals    Frequency    Min 3X/week      PT Plan Current plan remains appropriate    Co-evaluation  AM-PAC PT "6 Clicks" Mobility   Outcome Measure  Help needed turning from your back to your side while in a flat bed without using bedrails?: None Help needed moving from lying on your back to sitting on the side of a flat bed without using bedrails?: None Help needed moving to and from a bed to a chair (including a wheelchair)?: A Little Help needed standing up from a chair using your arms (e.g., wheelchair or bedside chair)?: None Help needed to walk in hospital room?: A Little Help needed climbing 3-5 steps with a railing? : A Little 6 Click Score: 21    End of Session   Activity Tolerance: Patient  tolerated treatment well Patient left: with call bell/phone within reach;in chair Nurse Communication: Mobility status PT Visit Diagnosis: Other abnormalities of gait and mobility (R26.89)     Time: 9323-5573 PT Time Calculation (min) (ACUTE ONLY): 12 min  Charges:  $Gait Training: 8-22 mins                     [place] [facility] [place] 09/03/2022, 1:11 PM

## 2022-09-06 ENCOUNTER — Telehealth: Payer: Self-pay

## 2022-09-06 NOTE — Telephone Encounter (Signed)
Noted will see this week.

## 2022-09-06 NOTE — Telephone Encounter (Signed)
Transition Care Management Follow-up Telephone Call Date of discharge and from where: [facility] 09-03-22 Dx: Acute blood loss anemia How have you been since you were released from the hospital? Doing ok  Any questions or concerns? No  Items Reviewed: Did the pt receive and understand the discharge instructions provided? Yes  Medications obtained and verified? Yes  Other? No  Any new allergies since your discharge? No  Dietary orders reviewed? Yes Do you have support at home? Yes   Home Care and Equipment/Supplies: Were home health services ordered? yes If so, what is the name of the agency? [facility]  Has the agency set up a time to come to the patient's home? yes Were any new equipment or medical supplies ordered?  Yes: [facility]  What is the name of the medical supply agency? Hospital  Were you able to get the supplies/equipment? yes Do you have any questions related to the use of the equipment or supplies? No  Functional Questionnaire: (I = Independent and D = Dependent) ADLs: I  Bathing/Dressing- I  Meal Prep- I  Eating- I  Maintaining continence- I  Transferring/Ambulation- I  Managing Meds- I  Follow up appointments reviewed:  PCP Hospital f/u appt confirmed? Yes  [facility]? No - daughter aware to make appt . Are transportation arrangements needed? No  If their condition worsens, is the pt aware to call PCP or go to the Emergency Dept.? Yes Was the patient provided with contact information for the PCP's office or ED? Yes Was to pt encouraged to call back with questions or concerns? Yes

## 2022-09-07 ENCOUNTER — Telehealth: Payer: Self-pay | Admitting: Family Medicine

## 2022-09-07 ENCOUNTER — Other Ambulatory Visit (HOSPITAL_COMMUNITY): Payer: Self-pay

## 2022-09-07 ENCOUNTER — Telehealth: Payer: Self-pay | Admitting: *Deleted

## 2022-09-07 MED ORDER — SODIUM CHLORIDE 0.9% FLUSH: 10.0000 mL | Freq: Every day | INTRAVENOUS | Status: AC

## 2022-09-07 MED ORDER — HEPARIN SOD (PORK) LOCK FLUSH 10 UNIT/ML IV SOLN: 10.0000 [IU] | Freq: Every day | INTRAVENOUS | Status: AC

## 2022-09-07 MED ORDER — NORMAL SALINE FLUSH 0.9 % IV SOLN
INTRAVENOUS | 0 refills | Status: DC
  Filled 2022-09-07: qty 300, 30d supply, fill #0

## 2022-09-07 MED ORDER — HEPARIN NA (PORK) LOCK FLSH PF 10 UNIT/ML IV SOLN
INTRAVENOUS | 0 refills | Status: DC
  Filled 2022-09-07: qty 150, 30d supply, fill #0

## 2022-09-07 NOTE — Telephone Encounter (Cosign Needed)
Post d/c orders received for PICC line care.

## 2022-09-07 NOTE — Telephone Encounter (Signed)
Regina Hernandez from [facility] called in and stated that Regina Hernandez was admitted to to there service yesterday. She was wondering if [name] would sign off on orders. Thank you!

## 2022-09-08 NOTE — Telephone Encounter (Signed)
Spoke to [place] and let her know that [name] will sign off on orders for pt.

## 2022-09-10 ENCOUNTER — Ambulatory Visit (INDEPENDENT_AMBULATORY_CARE_PROVIDER_SITE_OTHER): Payer: Medicare Other | Admitting: Family Medicine

## 2022-09-10 VITALS — BP 100/62 | HR 61 | Temp 97.1°F | Ht 62.0 in | Wt 152.0 lb

## 2022-09-10 DIAGNOSIS — K920 Hematemesis: Secondary | ICD-10-CM

## 2022-09-10 DIAGNOSIS — I10 Essential (primary) hypertension: Secondary | ICD-10-CM

## 2022-09-10 DIAGNOSIS — I472 Ventricular tachycardia, unspecified: Secondary | ICD-10-CM

## 2022-09-10 DIAGNOSIS — Z23 Encounter for immunization: Secondary | ICD-10-CM | POA: Diagnosis not present

## 2022-09-10 DIAGNOSIS — F323 Major depressive disorder, single episode, severe with psychotic features: Secondary | ICD-10-CM | POA: Diagnosis not present

## 2022-09-10 DIAGNOSIS — M1990 Unspecified osteoarthritis, unspecified site: Secondary | ICD-10-CM

## 2022-09-10 DIAGNOSIS — N1831 Chronic kidney disease, stage 3a: Secondary | ICD-10-CM

## 2022-09-10 LAB — BASIC METABOLIC PANEL
BUN: 18 mg/dL (ref 6–23)
CO2: 26 mEq/L (ref 19–32)
Calcium: 9.5 mg/dL (ref 8.4–10.5)
Chloride: 99 mEq/L (ref 96–112)
Creatinine, Ser: 0.91 mg/dL (ref 0.40–1.20)
GFR: 57.13 mL/min — ABNORMAL LOW (ref >60.00)
Glucose, Bld: 88 mg/dL (ref 70–99)
Potassium: 4.6 mEq/L (ref 3.5–5.1)
Sodium: 134 mEq/L — ABNORMAL LOW (ref 135–145)

## 2022-09-10 LAB — CBC
HCT: 29.2 % — ABNORMAL LOW (ref 36.0–46.0)
Hemoglobin: 9.6 g/dL — ABNORMAL LOW (ref 12.0–15.0)
MCHC: 32.8 g/dL (ref 30.0–36.0)
MCV: 89.4 fl (ref 78.0–100.0)
Platelets: 267 10*3/uL (ref 150.0–400.0)
RBC: 3.26 Mil/uL — ABNORMAL LOW (ref 3.87–5.11)
RDW: 16.2 % — ABNORMAL HIGH (ref 11.5–15.5)
WBC: 9.2 10*3/uL (ref 4.0–10.5)

## 2022-09-10 NOTE — Assessment & Plan Note (Signed)
Controlled. Cont metoprolol 12.5 mg bid.

## 2022-09-10 NOTE — Assessment & Plan Note (Signed)
She is taking Tylenol with limited relief.  GI bleed in the setting of NSAIDs.  She has home health PT coming today advised that she discuss her chronic low back pain and get some exercises.  If no response to this will consider outpatient PT referral.

## 2022-09-10 NOTE — Assessment & Plan Note (Signed)
She was noted to have A-fib in the hospital, not a candidate for anticoagulation secondary to severe GI bleed.  Continue metoprolol.

## 2022-09-10 NOTE — Assessment & Plan Note (Signed)
Recheck labs in setting of recent GI bleed. Continue home hydration

## 2022-09-10 NOTE — Patient Instructions (Addendum)
Check with GI about the PICC Line  Either GI or Home health nursing - should help remove  I'm glad you are doing better!  Ask Physical Therapy about Back pain exercises   Schedule Transfer of Care with [name] in 2 months for a follow-up    Labs today

## 2022-09-10 NOTE — Assessment & Plan Note (Signed)
No NSAIDS. Stable. Cont protonix 40 mg bid and sucralfate 1 gm 4 times daily. F/u with GI next week

## 2022-09-10 NOTE — Assessment & Plan Note (Signed)
Stable on abilify 2 mg, BuSpar 10 mg twice daily, Effexor 150 mg daily.

## 2022-09-10 NOTE — Progress Notes (Signed)
Subjective:     Regina Hernandez is a 88 y.o. female presenting for Hospitalization Follow-up     HPI  #GI Bleed - still has a PICC line in place - has GI appt next week - currently just flushing the PICC line at home  - overall feeling better - no vomiting - no dark stools - no lightheadedness or dizziness - walking with the walker   Started in setting of aleve for leg/back pain  Has morning back pain - tylenol not helping    Has HH OT/PT/nursing - has seen OT and nursing - PT coming today  Received IV lasix in the hospital - no further swelling since then   GI bleed - taking pantoprazole 40 mg bid - started carafate 4 times daily - taking medication with food  - doing good with PO intake   Review of Systems  9/1-09/03/2022 Admission - GI bleed with shock - EGD with 2 ulcers cauterized. Several transfusions. GAD embolization by IR. Afib not a anticoag candidate  Social History   Tobacco Use  Smoking Status Never   Passive exposure: Never  Smokeless Tobacco Never        Objective:    BP Readings from Last 3 Encounters:  09/10/22 100/62  09/03/22 (!) 148/82  08/28/22 105/68   Wt Readings from Last 3 Encounters:  09/10/22 152 lb (68.9 kg)  08/29/22 165 lb 2 oz (74.9 kg)  08/27/22 155 lb (70.3 kg)    BP 100/62   Pulse 61   Temp (!) 97.1 F (36.2 C) (Temporal)   Ht '5\' 2"'$  (1.575 m)   Wt 152 lb (68.9 kg)   SpO2 100%   BMI 27.80 kg/m    Physical Exam Constitutional:      General: She is not in acute distress.    Appearance: She is well-developed. She is not diaphoretic.  HENT:     Right Ear: External ear normal.     Left Ear: External ear normal.     Nose: Nose normal.  Eyes:     Comments: Conjunctival pallor  Cardiovascular:     Rate and Rhythm: Normal rate and regular rhythm.     Heart sounds: Murmur heard.  Pulmonary:     Effort: Pulmonary effort is normal. No respiratory distress.     Breath sounds: Normal breath sounds. No  wheezing.  Musculoskeletal:     Cervical back: Neck supple.  Skin:    General: Skin is warm and dry.     Capillary Refill: Capillary refill takes less than 2 seconds.     Comments: pale  Neurological:     Mental Status: She is alert. Mental status is at baseline.  Psychiatric:        Mood and Affect: Mood normal.        Behavior: Behavior normal.           Assessment & Plan:   Problem List Items Addressed This Visit       Cardiovascular and Mediastinum   Essential hypertension (Chronic)    Controlled. Cont metoprolol 12.5 mg bid.       Ventricular tachycardia ([place])    She was noted to have A-fib in the hospital, not a candidate for anticoagulation secondary to severe GI bleed.  Continue metoprolol.        Digestive   GI bleed - Primary    No NSAIDS. Stable. Cont protonix 40 mg bid and sucralfate 1 gm 4 times daily. F/u with GI next week  Relevant Orders   Basic metabolic panel   CBC     Musculoskeletal and Integument   Arthritis    She is taking Tylenol with limited relief.  GI bleed in the setting of NSAIDs.  She has home health PT coming today advised that she discuss her chronic low back pain and get some exercises.  If no response to this will consider outpatient PT referral.        Genitourinary   Stage 3 chronic kidney disease ([place])    Recheck labs in setting of recent GI bleed. Continue home hydration      Relevant Orders   Basic metabolic panel     Other   Major depression with psychotic features (HCC)    Stable on abilify 2 mg, BuSpar 10 mg twice daily, Effexor 150 mg daily.      Other Visit Diagnoses     Need for influenza vaccination       Relevant Orders   Flu Vaccine QUAD High Dose(Fluad)        Return in about 2 months (around 11/10/2022) for TOC with [employer] .  Lesleigh Noe, MD

## 2022-09-12 ENCOUNTER — Encounter: Payer: Self-pay | Admitting: Cardiology

## 2022-09-13 ENCOUNTER — Other Ambulatory Visit: Payer: Self-pay

## 2022-09-13 ENCOUNTER — Telehealth: Payer: Self-pay | Admitting: Family Medicine

## 2022-09-13 MED ORDER — METOPROLOL TARTRATE 25 MG PO TABS: 12.5000 mg | ORAL_TABLET | Freq: Two times a day (BID) | ORAL | 1 refills | Status: DC

## 2022-09-13 NOTE — Telephone Encounter (Signed)
Home Health verbal orders Caller Name:Tammy Agency Name: [facility] number:  [phone]  Requesting OT/PT/Skilled nursing/Social Work/Speech:nursing Care giver will do all other days  Reason:new skin tear right hand Xeroform daily  Frequency:once wk until wound is healed  Please forward to [facility] pool or providers CMA

## 2022-09-13 NOTE — Progress Notes (Signed)
Christell Faith, PA okayed refill per MyChart request below:   Regina Hernandez was in the hospital from 9/1 through 9/8. Several of those days were in the ICU. She was finally discharged on 9/8 with several medication changes. Her next scheduled appt with you is 02/24/23. They DC'd her Norvasc 2.'5mg'$  and started her on Lopressor '25mg'$  take 1/2 tab in AM and PM. The script was only for 30 days worth.  We will need a new script for this med to last at least until her next appt in Feb 2024. Our Pharm is [facility] in [place] on [address]. Your assistance is greatly appreciated. If you feel you need to see her before 02/24/23 please let us know.

## 2022-09-14 ENCOUNTER — Encounter: Payer: Self-pay | Admitting: Gastroenterology

## 2022-09-14 ENCOUNTER — Ambulatory Visit (INDEPENDENT_AMBULATORY_CARE_PROVIDER_SITE_OTHER): Payer: Medicare Other | Admitting: Gastroenterology

## 2022-09-14 VITALS — BP 122/84 | HR 76 | Ht 62.0 in | Wt 153.0 lb

## 2022-09-14 DIAGNOSIS — K264 Chronic or unspecified duodenal ulcer with hemorrhage: Secondary | ICD-10-CM

## 2022-09-14 DIAGNOSIS — Z452 Encounter for adjustment and management of vascular access device: Secondary | ICD-10-CM

## 2022-09-14 MED ORDER — PANTOPRAZOLE SODIUM 40 MG PO TBEC: 40.0000 mg | DELAYED_RELEASE_TABLET | Freq: Every day | ORAL | 3 refills | Status: DC

## 2022-09-14 MED ORDER — PANTOPRAZOLE SODIUM 40 MG PO TBEC: 40.0000 mg | DELAYED_RELEASE_TABLET | Freq: Two times a day (BID) | ORAL | 0 refills | Status: DC

## 2022-09-14 MED ORDER — SUCRALFATE 1 GM/10ML PO SUSP: 1.0000 g | Freq: Two times a day (BID) | ORAL | 0 refills | Status: DC

## 2022-09-14 NOTE — Patient Instructions (Addendum)
_______________________________________________________  If you are age 86 or older, your body mass index should be between 23-30. Your Body mass index is 27.98 kg/m. If this is out of the aforementioned range listed, please consider follow up with your Primary Care Provider.  If you are age 16 or younger, your body mass index should be between 19-25. Your Body mass index is 27.98 kg/m. If this is out of the aformentioned range listed, please consider follow up with your Primary Care Provider.   We have sent the following medications to your pharmacy for you to pick up at your convenience: Pantoprazole 40 mg twice daily for 6 weeks. Then decrease to once daily. Carafate suspension 10 ml twice daily for 2 weeks then stop.   The [place] [facility] providers would like to encourage you to use [facility] to communicate with providers for non-urgent requests or questions.  Due to long hold times on the telephone, sending your provider a message by [facility] may be a faster and more efficient way to get a response.  Please allow 48 business hours for a response.  Please remember that this is for non-urgent requests.   It was a pleasure to see you today!  Thank you for trusting me with your gastrointestinal care!

## 2022-09-14 NOTE — Progress Notes (Signed)
HPI : Regina Hernandez is a very pleasant 88 year old female with history of A-fib/SSS on anticoagulation, mild dementia who was recently admitted to the hospital for a severe upper GI bleed secondary to a duodenal ulcer. She was admitted to [facility] on September 1 with coffee-ground emesis.  She underwent an EGD by Dr. Alice Reichert in which two ulcers were found in the second portion of the duodenum, both treated with cautery and epinephrine injection.  Hemostasis appeared to have been achieved, but she continued to have evidence of rebleeding.  A second upper endoscopy was performed, and one of the ulcers was oozing and was again treated with bipolar cauterization.  The other ulcer did not have stigmata, and was not treated.  Despite this, the patient exhibited ongoing evidence of active GI bleeding, and she was transferred to [facility] Sept 2nd where a CTA exhibited active bleeding in the second portion of the duodenum.  She underwent coil embolization of the GDA on September 2, but continued to exhibit signs of brisk active hemorrhage the morning of September 3 with hypertension.  She was transferred to the ICU.  She underwent a third upper endoscopy performed by myself on September 3.  This endoscopy was notable for a large ulcer in the distal bulb/proximal second portion with visible vessel.  There was no active bleeding.  Given the absence of bleeding, the large size of the ulcer and the previous failed attempts at cauterization, no further attempts at hemostasis were made.  She received multiple blood transfusions during her hospitalization. She was treated medically with high-dose PPI and Carafate and monitored until the time of her discharge on September 8.  She had no further procedures following the EGD on September 3.  Today, the patient reports doing much better.  Her stools have been consistently brown.  She denies any abdominal pain, nausea or vomiting.  Her appetite is good.  She is taking Protonix  twice a day and Carafate 4 times a day.  Her energy level is improving.  Her hemoglobin was recently checked by her primary doctor and was stable from her discharge level.  She is not taking oral iron, as she has had significant side effects from this in the past. She had previously been on anticoagulation, but this is being held.  Her aspirin is also being held.  Patient had been taking Aleve for arthritis pain, which was the most likely culprit for her ulcer.  The patient was discharged from the hospital with a PICC line in place.  The patient's daughter is present with the patient, and indicates that there was not a clear reason why she was discharged with the PICC line.  The patient would like to have it removed. I reviewed the discharge summary, and there is no mention of plans for any IV medications needed at home and no mention of PICC line management following discharge.  Past Medical History:  Diagnosis Date   Anxiety    Cervical dystonia    Essential tremor    GI bleed    Memory loss    Migraine    Occipital neuralgia      Past Surgical History:  Procedure Laterality Date   BOWEL RESECTION     CARDIAC CATHETERIZATION  2016   no stents/[facility]   ESOPHAGOGASTRODUODENOSCOPY N/A 06/18/2020   Procedure: ESOPHAGOGASTRODUODENOSCOPY (EGD);  Surgeon: Toledo, Benay Pike, MD;  Location: [facility] ENDOSCOPY;  Service: Gastroenterology;  Laterality: N/A;   ESOPHAGOGASTRODUODENOSCOPY N/A 08/29/2022   Procedure: ESOPHAGOGASTRODUODENOSCOPY (EGD);  Surgeon: Daryel November, MD;  Location: [place];  Service: Gastroenterology;  Laterality: N/A;   ESOPHAGOGASTRODUODENOSCOPY N/A 08/27/2022   Procedure: ESOPHAGOGASTRODUODENOSCOPY (EGD);  Surgeon: Toledo, Benay Pike, MD;  Location: [facility] ENDOSCOPY;  Service: Gastroenterology;  Laterality: N/A;   ESOPHAGOGASTRODUODENOSCOPY (EGD) WITH PROPOFOL N/A 08/28/2022   Procedure: ESOPHAGOGASTRODUODENOSCOPY (EGD) WITH PROPOFOL;  Surgeon: Toledo,  Benay Pike, MD;  Location: [facility] ENDOSCOPY;  Service: Gastroenterology;  Laterality: N/A;   IR ANGIOGRAM VISCERAL SELECTIVE  08/29/2022   IR ANGIOGRAM VISCERAL SELECTIVE  08/29/2022   IR ANGIOGRAM VISCERAL SELECTIVE  08/29/2022   IR AORTAGRAM ABDOMINAL SERIALOGRAM  08/29/2022   IR EMBO ART  VEN HEMORR LYMPH EXTRAV  INC GUIDE ROADMAPPING  08/29/2022   IR US GUIDE VASC ACCESS RIGHT  08/29/2022   PACEMAKER IMPLANT     REPLACEMENT TOTAL KNEE Left    Family History  Problem Relation Age of Onset   Other Mother        "old age"   Ulcers Father    Stomach cancer Brother    Social History   Tobacco Use   Smoking status: Never    Passive exposure: Never   Smokeless tobacco: Never  Vaping Use   Vaping Use: Never used  Substance Use Topics   Alcohol use: Not Currently   Drug use: Never   Current Outpatient Medications  Medication Sig Dispense Refill   acetaminophen (TYLENOL) 500 MG tablet Take 1,000 mg by mouth 2 (two) times daily.     ARIPiprazole (ABILIFY) 2 MG tablet Take 2 mg by mouth daily.     atorvastatin (LIPITOR) 10 MG tablet Take 10 mg by mouth daily.     busPIRone (BUSPAR) 10 MG tablet Take 10 mg by mouth 2 (two) times daily.     cholecalciferol (VITAMIN D3) 25 MCG (1000 UNIT) tablet Take 1,000 Units by mouth daily.     docusate sodium (COLACE) 100 MG capsule Take 100 mg by mouth daily as needed for mild constipation.     gabapentin (NEURONTIN) 400 MG capsule Take 400 mg by mouth 2 (two) times daily.     Heparin Sod, Pork, Lock Flush (HEPARIN FLUSH) 10 UNIT/ML injection Flush 5 mL into PICC line daily. 150 mL 0   memantine (NAMENDA) 10 MG tablet Take 10 mg by mouth 2 (two) times daily.     metoprolol tartrate (LOPRESSOR) 25 MG tablet Take 0.5 tablets (12.5 mg total) by mouth 2 (two) times daily. 90 tablet 1   pantoprazole (PROTONIX) 40 MG tablet Take 1 tablet (40 mg total) by mouth 2 (two) times daily. 60 tablet 3   primidone (MYSOLINE) 50 MG tablet Take 100 mg by mouth daily.     Sodium  Chloride Flush (NORMAL SALINE FLUSH) 0.9 % SOLN Flush 10 mLs daily as directed. 300 mL 0   sucralfate (CARAFATE) 1 GM/10ML suspension Take 10 mLs (1 g total) by mouth every 6 (six) hours. 420 mL 0   venlafaxine XR (EFFEXOR XR) 150 MG 24 hr capsule Take 150 mg by mouth daily with breakfast.     vitamin B-12 (CYANOCOBALAMIN) 500 MCG tablet Take 500 mcg by mouth daily.     Current Facility-Administered Medications  Medication Dose Route Frequency Provider Last Rate Last Admin   heparin flush 10 UNIT/ML injection 10 Units  10 Units Intravenous Daily Charlynne Cousins, MD       sodium chloride flush (NS) 0.9 % injection 10 mL  10 mL Intravenous Daily Charlynne Cousins, MD  Allergies  Allergen Reactions   Adhesive [Tape] Other (See Comments)    Contact dermatitis   Baclofen Rash   Lyrica [Pregabalin] Other (See Comments)    Unable to remember reaction.   Sulfa Antibiotics Rash     Review of Systems: All systems reviewed and negative except where noted in HPI.     Physical Exam: BP 122/84   Pulse 76   Ht '5\' 2"'$  (1.575 m)   Wt 153 lb (69.4 kg)   BMI 27.98 kg/m  Constitutional: Pleasant,well-developed, frail elderly Caucasian female in no acute distress.  Accompanied by daughter HEENT: Normocephalic and atraumatic. Conjunctivae are normal. No scleral icterus.  Dentures Neck supple.  Cardiovascular: Normal rate, regular rhythm.  Pulmonary/chest: Effort normal and breath sounds normal. No wheezing, rales or rhonchi. Abdominal: Soft, nondistended, nontender. Bowel sounds active throughout. There are no masses palpable. No hepatomegaly. Neurological: Alert and oriented to person place and time. Skin: Skin is warm and dry. No rashes noted.  PICC line in place in right upper extremity, clean dressing, no erythema Psychiatric: Normal mood and affect. Behavior is normal.  CBC    Component Value Date/Time   WBC 9.2 09/10/2022 1000   RBC 3.26 (L) 09/10/2022 1000   HGB 9.6 (L)  09/10/2022 1000   HGB 12.7 02/12/2021 1307   HCT 29.2 (L) 09/10/2022 1000   HCT 39.1 02/12/2021 1307   PLT 267.0 09/10/2022 1000   PLT 183 02/12/2021 1307   MCV 89.4 09/10/2022 1000   MCV 95 02/12/2021 1307   MCH 30.5 09/01/2022 1800   MCHC 32.8 09/10/2022 1000   RDW 16.2 (H) 09/10/2022 1000   RDW 12.0 02/12/2021 1307   LYMPHSABS 1.9 09/01/2022 1800   LYMPHSABS 2.8 02/04/2020 1439   MONOABS 0.8 09/01/2022 1800   EOSABS 0.4 09/01/2022 1800   EOSABS 0.3 02/04/2020 1439   BASOSABS 0.1 09/01/2022 1800   BASOSABS 0.1 02/04/2020 1439    CMP     Component Value Date/Time   NA 134 (L) 09/10/2022 1000   NA 139 07/23/2021 0000   K 4.6 09/10/2022 1000   CL 99 09/10/2022 1000   CO2 26 09/10/2022 1000   GLUCOSE 88 09/10/2022 1000   BUN 18 09/10/2022 1000   BUN 15 07/23/2021 0000   CREATININE 0.91 09/10/2022 1000   CALCIUM 9.5 09/10/2022 1000   PROT 5.0 (L) 09/02/2022 0318   PROT 6.8 02/04/2020 1439   ALBUMIN 2.8 (L) 09/02/2022 0318   ALBUMIN 4.3 02/04/2020 1439   AST 19 09/02/2022 0318   ALT 18 09/02/2022 0318   ALKPHOS 45 09/02/2022 0318   BILITOT 0.7 09/02/2022 0318   BILITOT 0.2 02/04/2020 1439   GFRNONAA >60 09/02/2022 0318   GFRAA >60 07/23/2020 0415     ASSESSMENT AND PLAN: 88 year old female with significant upper GI bleed from a duodenal ulcer with 2 failed endoscopic attempts at hemostasis and subsequent coil embolization of the GDA.  The patient experienced brisk hemorrhage following the coiling, but subsequently bleeding resolved spontaneously.  She did have 1 more episode of bleeding during her hospitalization which again resolved spontaneously.  She is now 10 days out from her discharge and has had only normal brown stools and no other symptoms.  Her hemoglobin has been stable. No plans for repeat endoscopy to assess healing of the ulcer.  I recommend she continue taking the twice daily Protonix for 8 weeks total, then decreasing to once daily dosing indefinitely.  I  think it is okay to decrease Carafate to  twice daily for the next 2 weeks and then stop.  The patient needs to avoid all NSAIDs indefinitely due to risk of recurrent ulcer.  She should take Tylenol or other non-NSAID therapies for her joint pains. I see no reason why the patient would continue to need a PICC line at home.  She is not receiving any IV medications and is not requiring frequent blood draws.  The patient's daughter states that the home health nurse has offered to remove the PICC line, but needs in order to do so.  I would be willing to place this order for PICC line removal.  Duodenal ulcer with hemorrhage/hypovolemic shock - Bleeding resolved - Continue Protonix twice daily for 8 weeks total, then continue once daily dosing indefinitely - Continue sucralfate suspension, okay to decrease to twice daily, then decrease after 2 more weeks. - No need for repeat upper endoscopy - Avoid NSAIDs indefinitely - Agree with holding anticoagulation - Would readdress risks and benefits of aspirin therapy after 8 weeks with primary care, as aspirin therapy alone is unlikely to cause significant ulcer.  PICC line - We will place an order for PICC line removal through her home health nurse  Xxavier Noon E. Candis Schatz, [facility]  Lesleigh Noe, MD

## 2022-09-14 NOTE — Telephone Encounter (Signed)
OK with HH as requested

## 2022-09-14 NOTE — Telephone Encounter (Signed)
Verbal orders given to [facility]

## 2022-09-15 ENCOUNTER — Telehealth: Payer: Self-pay | Admitting: Gastroenterology

## 2022-09-15 NOTE — Telephone Encounter (Signed)
Left message for pt to call back  °

## 2022-09-15 NOTE — Telephone Encounter (Signed)
Tammy from home health called , seeking an order for a pick line, can you please call her at [phone].                          Thanks

## 2022-09-16 ENCOUNTER — Encounter: Payer: Self-pay | Admitting: Gastroenterology

## 2022-09-16 NOTE — Telephone Encounter (Signed)
Regina Hernandez called regarding a pick line order , phone number was incorrect , can you please call her at [phone]  Thank you

## 2022-09-16 NOTE — Telephone Encounter (Signed)
Order given to Tammy to D/C PICC line per [name].

## 2022-09-23 LAB — BPAM RBC
Blood Product Expiration Date: 202309262359
Blood Product Expiration Date: 202309262359
Blood Product Expiration Date: 202309262359
ISSUE DATE / TIME: 202309010951
ISSUE DATE / TIME: 202309010951
ISSUE DATE / TIME: 202309011138
Unit Type and Rh: 6200
Unit Type and Rh: 6200
Unit Type and Rh: 6200

## 2022-09-23 LAB — TYPE AND SCREEN
ABO/RH(D): A POS
Antibody Screen: NEGATIVE
Unit division: 0
Unit division: 0
Unit division: 0

## 2022-09-27 ENCOUNTER — Other Ambulatory Visit: Payer: Self-pay | Admitting: Gastroenterology

## 2022-09-30 ENCOUNTER — Ambulatory Visit (INDEPENDENT_AMBULATORY_CARE_PROVIDER_SITE_OTHER): Payer: Medicare Other

## 2022-09-30 DIAGNOSIS — I495 Sick sinus syndrome: Secondary | ICD-10-CM | POA: Diagnosis not present

## 2022-10-01 ENCOUNTER — Telehealth: Payer: Self-pay | Admitting: Family Medicine

## 2022-10-01 DIAGNOSIS — F419 Anxiety disorder, unspecified: Secondary | ICD-10-CM

## 2022-10-01 DIAGNOSIS — K264 Chronic or unspecified duodenal ulcer with hemorrhage: Secondary | ICD-10-CM

## 2022-10-01 DIAGNOSIS — G3184 Mild cognitive impairment, so stated: Secondary | ICD-10-CM

## 2022-10-01 DIAGNOSIS — Z452 Encounter for adjustment and management of vascular access device: Secondary | ICD-10-CM

## 2022-10-01 DIAGNOSIS — Z96652 Presence of left artificial knee joint: Secondary | ICD-10-CM

## 2022-10-01 DIAGNOSIS — E440 Moderate protein-calorie malnutrition: Secondary | ICD-10-CM

## 2022-10-01 DIAGNOSIS — I482 Chronic atrial fibrillation, unspecified: Secondary | ICD-10-CM

## 2022-10-01 DIAGNOSIS — R001 Bradycardia, unspecified: Secondary | ICD-10-CM

## 2022-10-01 DIAGNOSIS — G250 Essential tremor: Secondary | ICD-10-CM

## 2022-10-01 DIAGNOSIS — D3501 Benign neoplasm of right adrenal gland: Secondary | ICD-10-CM

## 2022-10-01 DIAGNOSIS — G243 Spasmodic torticollis: Secondary | ICD-10-CM

## 2022-10-01 DIAGNOSIS — K7689 Other specified diseases of liver: Secondary | ICD-10-CM

## 2022-10-01 DIAGNOSIS — Z9049 Acquired absence of other specified parts of digestive tract: Secondary | ICD-10-CM

## 2022-10-01 DIAGNOSIS — Z9181 History of falling: Secondary | ICD-10-CM

## 2022-10-01 DIAGNOSIS — F32A Depression, unspecified: Secondary | ICD-10-CM

## 2022-10-01 DIAGNOSIS — I700 Atherosclerosis of aorta: Secondary | ICD-10-CM

## 2022-10-01 DIAGNOSIS — Z950 Presence of cardiac pacemaker: Secondary | ICD-10-CM

## 2022-10-01 DIAGNOSIS — M5481 Occipital neuralgia: Secondary | ICD-10-CM

## 2022-10-01 DIAGNOSIS — I10 Essential (primary) hypertension: Secondary | ICD-10-CM

## 2022-10-01 DIAGNOSIS — G43909 Migraine, unspecified, not intractable, without status migrainosus: Secondary | ICD-10-CM

## 2022-10-01 DIAGNOSIS — D62 Acute posthemorrhagic anemia: Secondary | ICD-10-CM

## 2022-10-01 DIAGNOSIS — D3502 Benign neoplasm of left adrenal gland: Secondary | ICD-10-CM

## 2022-10-01 DIAGNOSIS — K922 Gastrointestinal hemorrhage, unspecified: Secondary | ICD-10-CM

## 2022-10-01 LAB — CUP PACEART REMOTE DEVICE CHECK
Battery Impedance: 2035 Ohm
Battery Remaining Longevity: 43 mo
Battery Voltage: 2.75 V
Brady Statistic AP VP Percent: 0 %
Brady Statistic AP VS Percent: 7 %
Brady Statistic AS VP Percent: 0 %
Brady Statistic AS VS Percent: 93 %
Date Time Interrogation Session: 20231005065546
Implantable Lead Implant Date: 20130122
Implantable Lead Implant Date: 20130122
Implantable Lead Location: 753859
Implantable Lead Location: 753860
Implantable Lead Model: 4092
Implantable Lead Model: 5076
Implantable Pulse Generator Implant Date: 20130122
Lead Channel Impedance Value: 376 Ohm
Lead Channel Impedance Value: 737 Ohm
Lead Channel Pacing Threshold Amplitude: 0.5 V
Lead Channel Pacing Threshold Amplitude: 0.625 V
Lead Channel Pacing Threshold Pulse Width: 0.4 ms
Lead Channel Pacing Threshold Pulse Width: 0.4 ms
Lead Channel Setting Pacing Amplitude: 1.5 V
Lead Channel Setting Pacing Amplitude: 2 V
Lead Channel Setting Pacing Pulse Width: 0.4 ms
Lead Channel Setting Sensing Sensitivity: 5.6 mV

## 2022-10-01 NOTE — Telephone Encounter (Signed)
Amy from [facility] called in stating that patient has 2 PT visits left but she wants to be discharged after her next visit. She stated she will be discharging 1 visit early. Thank you!

## 2022-10-01 NOTE — Telephone Encounter (Signed)
Noted  

## 2022-10-07 NOTE — Progress Notes (Signed)
Remote pacemaker transmission.   

## 2022-11-03 ENCOUNTER — Other Ambulatory Visit: Payer: Self-pay | Admitting: Neurology

## 2022-11-08 ENCOUNTER — Ambulatory Visit (INDEPENDENT_AMBULATORY_CARE_PROVIDER_SITE_OTHER): Payer: Medicare Other | Admitting: Nurse Practitioner

## 2022-11-08 VITALS — BP 124/68 | HR 64 | Temp 96.0°F | Resp 12 | Ht 62.0 in | Wt 154.0 lb

## 2022-11-08 DIAGNOSIS — Z950 Presence of cardiac pacemaker: Secondary | ICD-10-CM

## 2022-11-08 DIAGNOSIS — N183 Chronic kidney disease, stage 3 unspecified: Secondary | ICD-10-CM | POA: Diagnosis not present

## 2022-11-08 DIAGNOSIS — I472 Ventricular tachycardia, unspecified: Secondary | ICD-10-CM | POA: Diagnosis not present

## 2022-11-08 DIAGNOSIS — G250 Essential tremor: Secondary | ICD-10-CM

## 2022-11-08 DIAGNOSIS — R269 Unspecified abnormalities of gait and mobility: Secondary | ICD-10-CM

## 2022-11-08 DIAGNOSIS — I495 Sick sinus syndrome: Secondary | ICD-10-CM | POA: Diagnosis not present

## 2022-11-08 DIAGNOSIS — K254 Chronic or unspecified gastric ulcer with hemorrhage: Secondary | ICD-10-CM

## 2022-11-08 DIAGNOSIS — I10 Essential (primary) hypertension: Secondary | ICD-10-CM

## 2022-11-08 DIAGNOSIS — G3184 Mild cognitive impairment, so stated: Secondary | ICD-10-CM

## 2022-11-08 LAB — COMPREHENSIVE METABOLIC PANEL
ALT: 9 U/L (ref 0–35)
AST: 15 U/L (ref 0–37)
Albumin: 4.1 g/dL (ref 3.5–5.2)
Alkaline Phosphatase: 50 U/L (ref 39–117)
BUN: 20 mg/dL (ref 6–23)
CO2: 30 mEq/L (ref 19–32)
Calcium: 10 mg/dL (ref 8.4–10.5)
Chloride: 101 mEq/L (ref 96–112)
Creatinine, Ser: 0.83 mg/dL (ref 0.40–1.20)
GFR: 63.73 mL/min (ref >60.00)
Glucose, Bld: 92 mg/dL (ref 70–99)
Potassium: 5.1 mEq/L (ref 3.5–5.1)
Sodium: 136 mEq/L (ref 135–145)
Total Bilirubin: 0.3 mg/dL (ref 0.2–1.2)
Total Protein: 7.1 g/dL (ref 6.0–8.3)

## 2022-11-08 LAB — CBC
HCT: 32.2 % — ABNORMAL LOW (ref 36.0–46.0)
Hemoglobin: 10.1 g/dL — ABNORMAL LOW (ref 12.0–15.0)
MCHC: 31.4 g/dL (ref 30.0–36.0)
MCV: 79.1 fl (ref 78.0–100.0)
Platelets: 176 10*3/uL (ref 150.0–400.0)
RBC: 4.07 Mil/uL (ref 3.87–5.11)
RDW: 17.9 % — ABNORMAL HIGH (ref 11.5–15.5)
WBC: 7.6 10*3/uL (ref 4.0–10.5)

## 2022-11-08 NOTE — Assessment & Plan Note (Signed)
Follow-up with cardiology and EP as recommended

## 2022-11-08 NOTE — Assessment & Plan Note (Signed)
Generally uses assistive device like a cane in public does not use anything at home.

## 2022-11-08 NOTE — Assessment & Plan Note (Signed)
Patient seems at baseline today patient's daughter at bedside.  She is going to a trial at an assisted living this coming weekend.  Patient maintained on Namenda.  Continue medication as prescribed

## 2022-11-08 NOTE — Assessment & Plan Note (Signed)
Status post pacemaker.  Continue cardiology as recommended

## 2022-11-08 NOTE — Progress Notes (Signed)
Established Patient Office Visit  Subjective   Patient ID: Regina Hernandez, female    DOB: [birth year]  Age: 88 y.o. MRN: [id number]  Chief Complaint  Patient presents with   Transfer of Care    HPI  States that she stays with her daughter. She lives with Regina Hernandez, Regina Hernandez, and mom live together. Assisted living for a trail period. [facility] [place].   Cardio: [name] Psych: [name] EP: [name] GI; Dr Candis Schatz acutely after GI bleed.  Patient has a new patient appointment with [name] schedule Urology: [name]    HTN: Does not check BP at home but has a cuff.  SSS/Pacemaker: Patient currently followed by cardiology and electrophysiology.  She does see [name] and [name].   Gi Bleed: Patient was admitted to the hospital on 08/27/2022.  Patient underwent endoscopy which showed a couple of bleeding ulcers and had to go to IR for cauterization.  Patient also needed blood transfusion per her report.  She was followed up with [name] in the GI clinic.  It was recommended patient do Protonix 40 mg twice daily for 8 weeks and then Protonix 40 mg daily indefinitely.  Patient states that her stools have normalized.  She is denying any lightheadedness or dizziness.  She denies any black tarry stools or blood in her stool.  Psych: States 1 every 3-4 months. No hi/si?AVH.  They manage patient's venlafaxine and buspirone.  MCI: States that she feels like she is doing ok. States that she feels like she is Licensed conveyancer, due to her age. States that she is able to feed, dress herself and prepare her own food.   DNR by Dr. Einar Hernandez. Has  POA and HPOA, patient's daughter who practices registered nurse.     Review of Systems  Constitutional:  Negative for chills and fever.  Gastrointestinal:  Negative for blood in stool and melena.  Psychiatric/Behavioral:  Negative for hallucinations and suicidal ideas.       Objective:     BP 124/68    Pulse 64   Temp (!) 96 F (35.6 C) (Temporal)   Resp 12   Ht '5\' 2"'$  (1.575 m)   Wt 154 lb (69.9 kg)   SpO2 99%   BMI 28.17 kg/m    Physical Exam Vitals and nursing note reviewed.  Constitutional:      Appearance: Normal appearance.     Comments: Patient ambulates with cane in office.  Patient's daughter at bedside.  HENT:     Right Ear: Tympanic membrane, ear canal and external ear normal.     Left Ear: Tympanic membrane, ear canal and external ear normal.     Mouth/Throat:     Mouth: Mucous membranes are moist.     Pharynx: Oropharynx is clear.  Eyes:     Extraocular Movements: Extraocular movements intact.     Pupils: Pupils are equal, round, and reactive to light.     Comments: Wears corrective lenses.  Cardiovascular:     Rate and Rhythm: Normal rate and regular rhythm.     Heart sounds: Normal heart sounds.  Pulmonary:     Breath sounds: Normal breath sounds.  Musculoskeletal:     Right lower leg: Edema present.     Left lower leg: Edema present.  Lymphadenopathy:     Cervical: No cervical adenopathy.  Neurological:     Mental Status: She is alert.     Comments: Patient alert and oriented to  herself.  Patient not correct month and corrected today of the week.  Patient got incorrect date and incorrect year.  Patient was able to identify the president and her daughter who is at bedside.  Psychiatric:        Mood and Affect: Mood normal.        Behavior: Behavior normal.        Thought Content: Thought content normal.        Judgment: Judgment normal.      No results found for any visits on 11/08/22.    The ASCVD Risk score (Arnett DK, et al., 2019) failed to calculate for the following reasons:   The 2019 ASCVD risk score is only valid for ages 51 to 29    Assessment & Plan:   Problem List Items Addressed This Visit       Cardiovascular and Mediastinum   Essential hypertension - Primary (Chronic)    Patient currently maintained on metoprolol.  Blood  pressure within normal limits.  Continue medication as prescribed      Relevant Orders   CBC   Comprehensive metabolic panel   Sick sinus syndrome ([place])    Status post pacemaker.  Continue cardiology as recommended      Ventricular tachycardia [facility])    Patient is followed by cardiology and EP.  Patient has a pacemaker implanted.  Follow-up with cardiology as recommended        Digestive   GI bleed    Did review most recent gastroenterology note.  Patient is to continue Protonix 40 mg twice daily for total of 8 weeks then go to Protonix 40 mg daily indefinitely.  Does have a new patient appointment coming up with [name]      Relevant Orders   CBC   Comprehensive metabolic panel     Nervous and Auditory   Mild cognitive impairment with memory loss (Chronic)    Patient seems at baseline today patient's daughter at bedside.  She is going to a trial at an assisted living this coming weekend.  Patient maintained on Namenda.  Continue medication as prescribed      Essential tremor    Patient currently maintained on primidone.  No tremor appreciated today        Genitourinary   Stage 3 chronic kidney disease ([place])    Pending labs today.      Relevant Orders   Comprehensive metabolic panel     Other   Gait abnormality    Generally uses assistive device like a cane in public does not use anything at home.      Pacemaker    Follow-up with cardiology and EP as recommended       Return in about 3 months (around 02/08/2023) for Recheck chornic conditions and housing .    Regina Garret, NP

## 2022-11-08 NOTE — Assessment & Plan Note (Signed)
Did review most recent gastroenterology note.  Patient is to continue Protonix 40 mg twice daily for total of 8 weeks then go to Protonix 40 mg daily indefinitely.  Does have a new patient appointment coming up with [name]

## 2022-11-08 NOTE — Assessment & Plan Note (Signed)
Patient currently maintained on primidone.  No tremor appreciated today

## 2022-11-08 NOTE — Assessment & Plan Note (Signed)
Patient currently maintained on metoprolol.  Blood pressure within normal limits.  Continue medication as prescribed

## 2022-11-08 NOTE — Patient Instructions (Signed)
Nice to see you today I want to see you in 3 months, sooner if you need me I will be in touch with the labs once I have them

## 2022-11-08 NOTE — Assessment & Plan Note (Signed)
Pending labs today. 

## 2022-11-08 NOTE — Assessment & Plan Note (Signed)
Patient is followed by cardiology and EP.  Patient has a pacemaker implanted.  Follow-up with cardiology as recommended

## 2022-12-21 ENCOUNTER — Other Ambulatory Visit: Payer: Self-pay | Admitting: Neurology

## 2022-12-24 ENCOUNTER — Other Ambulatory Visit: Payer: Self-pay | Admitting: Psychiatry

## 2022-12-28 ENCOUNTER — Other Ambulatory Visit: Payer: Self-pay | Admitting: Nurse Practitioner

## 2022-12-29 ENCOUNTER — Other Ambulatory Visit: Payer: Self-pay

## 2022-12-29 MED ORDER — GABAPENTIN 400 MG PO CAPS: 400.0000 mg | ORAL_CAPSULE | Freq: Two times a day (BID) | ORAL | 3 refills | Status: DC

## 2022-12-29 MED ORDER — MEMANTINE HCL 10 MG PO TABS: 10.0000 mg | ORAL_TABLET | Freq: Two times a day (BID) | ORAL | 3 refills | Status: DC

## 2022-12-30 ENCOUNTER — Ambulatory Visit (INDEPENDENT_AMBULATORY_CARE_PROVIDER_SITE_OTHER): Payer: Medicare Other

## 2022-12-30 DIAGNOSIS — I495 Sick sinus syndrome: Secondary | ICD-10-CM

## 2022-12-30 LAB — CUP PACEART REMOTE DEVICE CHECK
Battery Impedance: 2008 Ohm
Battery Remaining Longevity: 43 mo
Battery Voltage: 2.74 V
Brady Statistic AP VP Percent: 0 %
Brady Statistic AP VS Percent: 14 %
Brady Statistic AS VP Percent: 0 %
Brady Statistic AS VS Percent: 86 %
Date Time Interrogation Session: 20240104064601
Implantable Lead Connection Status: 753985
Implantable Lead Connection Status: 753985
Implantable Lead Implant Date: 20130122
Implantable Lead Implant Date: 20130122
Implantable Lead Location: 753859
Implantable Lead Location: 753860
Implantable Lead Model: 4092
Implantable Lead Model: 5076
Implantable Pulse Generator Implant Date: 20130122
Lead Channel Impedance Value: 358 Ohm
Lead Channel Impedance Value: 692 Ohm
Lead Channel Pacing Threshold Amplitude: 0.5 V
Lead Channel Pacing Threshold Amplitude: 0.625 V
Lead Channel Pacing Threshold Pulse Width: 0.4 ms
Lead Channel Pacing Threshold Pulse Width: 0.4 ms
Lead Channel Setting Pacing Amplitude: 1.5 V
Lead Channel Setting Pacing Amplitude: 2 V
Lead Channel Setting Pacing Pulse Width: 0.4 ms
Lead Channel Setting Sensing Sensitivity: 5.6 mV
Zone Setting Status: 755011
Zone Setting Status: 755011

## 2023-01-17 ENCOUNTER — Telehealth: Payer: Self-pay

## 2023-01-17 NOTE — Telephone Encounter (Signed)
Patient saw [facility] on 09/14/2022 . She has a appointment with [place] on 01/24/2023. Please call patient and find out if she is coming to see [place]. If she is we need to find out why she is transferring care

## 2023-01-17 NOTE — Telephone Encounter (Signed)
She has to follow-up with [facility] gastroenterology as she was previously seen by them   RV

## 2023-01-17 NOTE — Telephone Encounter (Signed)
Patient wants to transfer care back to a [place] office, are you okay with seeing her?

## 2023-01-17 NOTE — Telephone Encounter (Signed)
Called patient, no answer. Left vm for a call back.

## 2023-01-18 NOTE — Telephone Encounter (Signed)
Regina Hernandez also stated to me that she saw them for a hospital follow up because when she was discharge from the hospital she was told to follow up in 2 weeks and we could not get her in the office in 2 weeks. She states [facility] could get her in the office with in 2 weeks so that why she made appointment with them.

## 2023-01-18 NOTE — Telephone Encounter (Signed)
Per [name] stated that he did not refuse to see her. He did recommend that she follow up with her usual gastroenterologist. She saw [place] as a follow up after hospitalization.  Per [name] she has seen Dr. Alice Reichert as in patient in the hospital and she needs to follow up with them if she wants to go to [place].

## 2023-01-18 NOTE — Telephone Encounter (Signed)
[name] will not see patient she states to follow up with [facility]

## 2023-01-20 NOTE — Progress Notes (Signed)
Remote pacemaker transmission.   

## 2023-01-24 ENCOUNTER — Ambulatory Visit: Payer: Medicare Other | Admitting: Gastroenterology

## 2023-02-04 ENCOUNTER — Other Ambulatory Visit: Payer: Self-pay

## 2023-02-04 NOTE — Telephone Encounter (Signed)
Last visit 11/08/2022 Next 02/08/2023

## 2023-02-07 MED ORDER — ATORVASTATIN CALCIUM 10 MG PO TABS: 10.0000 mg | ORAL_TABLET | Freq: Every day | ORAL | 0 refills | Status: DC

## 2023-02-08 ENCOUNTER — Encounter: Payer: Self-pay | Admitting: Nurse Practitioner

## 2023-02-08 ENCOUNTER — Ambulatory Visit (INDEPENDENT_AMBULATORY_CARE_PROVIDER_SITE_OTHER): Payer: Medicare Other | Admitting: Nurse Practitioner

## 2023-02-08 VITALS — BP 130/76 | HR 66 | Temp 98.0°F | Resp 16 | Ht 62.0 in | Wt 158.1 lb

## 2023-02-08 DIAGNOSIS — E663 Overweight: Secondary | ICD-10-CM | POA: Diagnosis not present

## 2023-02-08 DIAGNOSIS — I10 Essential (primary) hypertension: Secondary | ICD-10-CM | POA: Diagnosis not present

## 2023-02-08 DIAGNOSIS — M25561 Pain in right knee: Secondary | ICD-10-CM | POA: Diagnosis not present

## 2023-02-08 DIAGNOSIS — K922 Gastrointestinal hemorrhage, unspecified: Secondary | ICD-10-CM | POA: Diagnosis not present

## 2023-02-08 DIAGNOSIS — G8929 Other chronic pain: Secondary | ICD-10-CM

## 2023-02-08 NOTE — Assessment & Plan Note (Signed)
Blood pressure still well-controlled on metoprolol.  Patient denies any adverse drug events continue

## 2023-02-08 NOTE — Progress Notes (Signed)
Established Patient Office Visit  Subjective   Patient ID: Regina Hernandez, female    DOB: 1934/03/09  Age: 89 y.o. MRN: [id number]  Chief Complaint  Patient presents with   Medical Management of Chronic Issues    Wants to talk about losing weight     HPI  HTN: Patient's blood pressure within normal limits today in office.  She is currently maintained on metoprolol.  Patient denies dizziness or lightheadedness.  CKD: Last time kidneys checked GFR was within normal limits.  This was post GI bleed  GI bleed: States that she was suppose to see [name].  Something happened with the scheduling and it was mixed up.  Patient does have a appointment scheduled with Dr. Alice Reichert in May 2024.  She is still currently on Protonix 40 mg daily.  Follow-up with GI  Right knee: States that she does have pain that is all the time and wears a knee brace that does not help. Tylenol that does not help. States no injection in the right knee.    Review of Systems  Constitutional:  Negative for chills and fever.  Respiratory:  Negative for shortness of breath.   Cardiovascular:  Negative for chest pain.  Gastrointestinal:  Negative for abdominal pain, blood in stool, melena, nausea and vomiting.  Neurological:  Negative for dizziness and headaches.      Objective:     BP 130/76   Pulse 66   Temp 98 F (36.7 C)   Resp 16   Ht 5' 2"$  (1.575 m)   Wt 158 lb 2 oz (71.7 kg)   SpO2 99%   BMI 28.92 kg/m  BP Readings from Last 3 Encounters:  02/08/23 130/76  11/08/22 124/68  09/14/22 122/84   Wt Readings from Last 3 Encounters:  02/08/23 158 lb 2 oz (71.7 kg)  11/08/22 154 lb (69.9 kg)  09/14/22 153 lb (69.4 kg)      Physical Exam Vitals and nursing note reviewed.  Constitutional:      Appearance: Normal appearance.  Cardiovascular:     Rate and Rhythm: Normal rate and regular rhythm.     Heart sounds: Normal heart sounds.  Pulmonary:     Effort: Pulmonary effort is normal.     Breath  sounds: Normal breath sounds.  Neurological:     Mental Status: She is alert.      No results found for any visits on 02/08/23.    The ASCVD Risk score (Arnett DK, et al., 2019) failed to calculate for the following reasons:   The 2019 ASCVD risk score is only valid for ages 27 to 82    Assessment & Plan:   Problem List Items Addressed This Visit       Cardiovascular and Mediastinum   Essential hypertension - Primary (Chronic)    Blood pressure still well-controlled on metoprolol.  Patient denies any adverse drug events continue        Digestive   GI bleed    Has finished Protonix 40 mg twice daily currently on Protonix 40 mg daily.  Does have a follow-up appointment with GI in May Dr. Alice Reichert        Other   Chronic pain of right knee    Patient complains of right knee pain she has tried a knee brace, Voltaren gel, lidocaine, Tylenol without good relief.  Patient is to avoid NSAIDs due to recent GI bleed.  She would like an injection in the knee as she does not want  to go through a knee replacement.  She will can schedule appoint with her sports medicine physician Dr. Lorelei Pont in office information given at discharge      Overweight    Patient states she is concerned with her weight and would like to help losing weight.  Currently seeing a chiropractor dealing with her back will be seeing sports medicine to deal with her knee once these are in better shape she can start walking around more as she tolerates.  Told patient I am not medically concerned with her weight at this juncture       Return in about 6 months (around 08/09/2023) for Chronic conditions recheck and labs .    [name], NP

## 2023-02-08 NOTE — Assessment & Plan Note (Signed)
Patient states she is concerned with her weight and would like to help losing weight.  Currently seeing a chiropractor dealing with her back will be seeing sports medicine to deal with her knee once these are in better shape she can start walking around more as she tolerates.  Told patient I am not medically concerned with her weight at this juncture

## 2023-02-08 NOTE — Patient Instructions (Signed)
Nice to see you today I want to see you in 6 months, sooner If you need me Make an appointment when you leave today with Dr. Frederico Hamman Copland. You can use tylenol, Voltaren gel, and ice like you have been

## 2023-02-08 NOTE — Assessment & Plan Note (Signed)
Has finished Protonix 40 mg twice daily currently on Protonix 40 mg daily.  Does have a follow-up appointment with GI in May Dr. Alice Reichert

## 2023-02-08 NOTE — Assessment & Plan Note (Signed)
Patient complains of right knee pain she has tried a knee brace, Voltaren gel, lidocaine, Tylenol without good relief.  Patient is to avoid NSAIDs due to recent GI bleed.  She would like an injection in the knee as she does not want to go through a knee replacement.  She will can schedule appoint with her sports medicine physician Dr. Lorelei Pont in office information given at discharge

## 2023-02-09 ENCOUNTER — Ambulatory Visit (INDEPENDENT_AMBULATORY_CARE_PROVIDER_SITE_OTHER): Payer: Medicare Other | Admitting: Family Medicine

## 2023-02-09 ENCOUNTER — Encounter: Payer: Self-pay | Admitting: Family Medicine

## 2023-02-09 VITALS — BP 130/80 | HR 89 | Temp 97.9°F | Ht 62.0 in | Wt 158.1 lb

## 2023-02-09 DIAGNOSIS — M1711 Unilateral primary osteoarthritis, right knee: Secondary | ICD-10-CM

## 2023-02-09 MED ORDER — TRIAMCINOLONE ACETONIDE 40 MG/ML IJ SUSP
40.0000 mg | Freq: Once | INTRAMUSCULAR | Status: AC
  Administered 2023-02-09: 40 mg via INTRA_ARTICULAR

## 2023-02-09 NOTE — Progress Notes (Unsigned)
    Manson Luckadoo T. Shykeem Resurreccion, MD, [facility] [place], [address]  Phone: [phone]  FAX: [phone]  Regina Hernandez - 89 y.o. female  MRN [id number]  Date of Birth: 04/19/1934  Date: 02/09/2023  PCP: Michela Pitcher, NP  Referral: Michela Pitcher, NP  Chief Complaint  Patient presents with   Knee Pain    Right   Subjective:   Regina Hernandez is a 89 y.o. very pleasant female patient with Body mass index is 28.92 kg/m. who presents with the following:  89 year old patient presents with knee pain.  R knee is hurting a lot, catches when she starts to stand up.  She has a lot of pain  2-3 years of pain  H/o L TKA done about 10 or 12 years ago.   Inj R knee  Review of Systems is noted in the HPI, as appropriate  Objective:   BP 130/80   Pulse 89   Temp 97.9 F (36.6 C) (Temporal)   Ht '5\' 2"'$  (1.575 m)   Wt 158 lb 2 oz (71.7 kg)   SpO2 97%   BMI 28.92 kg/m   GEN: No acute distress; alert,appropriate. PULM: Breathing comfortably in no respiratory distress PSYCH: Normally interactive.   Laboratory and Imaging Data:  Assessment and Plan:   ***

## 2023-02-10 ENCOUNTER — Encounter: Payer: Self-pay | Admitting: Family Medicine

## 2023-02-12 ENCOUNTER — Other Ambulatory Visit: Payer: Self-pay | Admitting: Nurse Practitioner

## 2023-02-24 ENCOUNTER — Ambulatory Visit: Payer: Medicare Other | Attending: Cardiology | Admitting: Cardiology

## 2023-02-24 ENCOUNTER — Encounter: Payer: Self-pay | Admitting: Cardiology

## 2023-02-24 VITALS — BP 130/90 | HR 69 | Ht 62.0 in | Wt 155.2 lb

## 2023-02-24 DIAGNOSIS — I951 Orthostatic hypotension: Secondary | ICD-10-CM | POA: Diagnosis not present

## 2023-02-24 DIAGNOSIS — I495 Sick sinus syndrome: Secondary | ICD-10-CM | POA: Diagnosis present

## 2023-02-24 NOTE — Patient Instructions (Signed)
Medication Instructions:   Your physician recommends that you continue on your current medications as directed. Please refer to the Current Medication list given to you today.  *If you need a refill on your cardiac medications before your next appointment, please call your pharmacy*   Lab Work:  None Ordered  If you have labs (blood work) drawn today and your tests are completely normal, you will receive your results only by: MyChart Message (if you have MyChart) OR A paper copy in the mail If you have any lab test that is abnormal or we need to change your treatment, we will call you to review the results.   Testing/Procedures:  None Ordered    Follow-Up: At [facility], you and your health needs are our priority.  As part of our continuing mission to provide you with exceptional heart care, we have created designated Provider Care Teams.  These Care Teams include your primary Cardiologist (physician) and Advanced Practice Providers (APPs -  Physician Assistants and Nurse Practitioners) who all work together to provide you with the care you need, when you need it.  We recommend signing up for the patient portal called "MyChart".  Sign up information is provided on this After Visit Summary.  MyChart is used to connect with patients for Virtual Visits (Telemedicine).  Patients are able to view lab/test results, encounter notes, upcoming appointments, etc.  Non-urgent messages can be sent to your provider as well.   To learn more about what you can do with MyChart, go to [email or web address]://[email or web address].[email or web address].    Your next appointment:   12 month(s)  Provider:   You may see Brian Agbor-Etang, MD or one of the following Advanced Practice Providers on your designated Care Team:   Christopher Berge, NP Ryan Dunn, PA-C Cadence Furth, PA-C Sheri Hammock, NP  

## 2023-02-24 NOTE — Progress Notes (Signed)
Cardiology Office Note:    Date:  02/24/2023   ID:  Destenee Jentzsch, DOB 10/15/1934, MRN [id number]  PCP:  Michela Pitcher, NP  Cardiologist:  Kate Sable, MD  Electrophysiologist:  None   Referring MD: Waunita Schooner, MD   Chief Complaint  Patient presents with   Follow-up    6 month f/u, some SOB    History of Present Illness:    Regina Hernandez is a 89 y.o. female with a hx of anxiety, bradycardia, orthostatic hypotension, sick sinus syndrome s/p permanent pacemaker (medtronic in 2013) who presents for follow-up.  Feels well, denies dizziness.  Previously seen for orthostatic hypotension, midodrine was started, BP became elevated.  Midodrine was stopped, Lopressor 12.5 mg twice daily started.  She feels well, denies any falls.  Has no concerns at this time.   Prior notes Echo 03/2022 EF 60 to 65%, mild aortic valve sclerosis with no stenosis Left heart cath in 2012 at [facility] showed minimal irregularities in the LAD and RCA. Patient was  admitted to the hospital in June 2021 due to bloody stools, diagnosed with peptic ulcer disease, received 2 units of packed red blood cells.  Aspirin was stopped.   Echocardiogram 05/2020 showed normal systolic function, impaired relaxation, EF 65%.  Past Medical History:  Diagnosis Date   Anxiety    Cervical dystonia    Essential tremor    GI bleed    Memory loss    Migraine    Occipital neuralgia     Past Surgical History:  Procedure Laterality Date   BOWEL RESECTION     CARDIAC CATHETERIZATION  2016   no stents/[facility]   ESOPHAGOGASTRODUODENOSCOPY N/A 06/18/2020   Procedure: ESOPHAGOGASTRODUODENOSCOPY (EGD);  Surgeon: Toledo, Benay Pike, MD;  Location: [facility] ENDOSCOPY;  Service: Gastroenterology;  Laterality: N/A;   ESOPHAGOGASTRODUODENOSCOPY N/A 08/29/2022   Procedure: ESOPHAGOGASTRODUODENOSCOPY (EGD);  Surgeon: Daryel November, MD;  Location: [facility];  Service: Gastroenterology;  Laterality: N/A;    ESOPHAGOGASTRODUODENOSCOPY N/A 08/27/2022   Procedure: ESOPHAGOGASTRODUODENOSCOPY (EGD);  Surgeon: Toledo, Benay Pike, MD;  Location: [facility] ENDOSCOPY;  Service: Gastroenterology;  Laterality: N/A;   ESOPHAGOGASTRODUODENOSCOPY (EGD) WITH PROPOFOL N/A 08/28/2022   Procedure: ESOPHAGOGASTRODUODENOSCOPY (EGD) WITH PROPOFOL;  Surgeon: Toledo, Benay Pike, MD;  Location: [facility] ENDOSCOPY;  Service: Gastroenterology;  Laterality: N/A;   IR ANGIOGRAM VISCERAL SELECTIVE  08/29/2022   IR ANGIOGRAM VISCERAL SELECTIVE  08/29/2022   IR ANGIOGRAM VISCERAL SELECTIVE  08/29/2022   IR AORTAGRAM ABDOMINAL SERIALOGRAM  08/29/2022   IR EMBO ART  VEN HEMORR LYMPH EXTRAV  INC GUIDE ROADMAPPING  08/29/2022   IR US GUIDE VASC ACCESS RIGHT  08/29/2022   PACEMAKER IMPLANT     REPLACEMENT TOTAL KNEE Left     Current Medications: Current Meds  Medication Sig   acetaminophen (TYLENOL) 500 MG tablet Take 1,000 mg by mouth 2 (two) times daily.   ARIPiprazole (ABILIFY) 5 MG tablet Take 5 mg by mouth every morning.   atorvastatin (LIPITOR) 10 MG tablet Take 1 tablet (10 mg total) by mouth daily.   busPIRone (BUSPAR) 10 MG tablet Take 10 mg by mouth 2 (two) times daily.   cholecalciferol (VITAMIN D3) 25 MCG (1000 UNIT) tablet Take 1,000 Units by mouth daily.   docusate sodium (COLACE) 100 MG capsule Take 100 mg by mouth daily as needed for mild constipation.   gabapentin (NEURONTIN) 400 MG capsule Take 1 capsule (400 mg total) by mouth 2 (two) times daily.   LORazepam (ATIVAN) 0.5 MG tablet Take  0.5 mg by mouth 2 (two) times daily as needed.   memantine (NAMENDA) 10 MG tablet TAKE 1 TABLET BY MOUTH TWICE A DAY   metoprolol tartrate (LOPRESSOR) 25 MG tablet Take 0.5 tablets (12.5 mg total) by mouth 2 (two) times daily.   nitrofurantoin (MACRODANTIN) 100 MG capsule Take 100 mg by mouth daily.   pantoprazole (PROTONIX) 40 MG tablet Take 1 tablet (40 mg total) by mouth daily.   primidone (MYSOLINE) 50 MG tablet Take 100 mg by mouth daily.    venlafaxine XR (EFFEXOR XR) 150 MG 24 hr capsule Take 150 mg by mouth daily with breakfast.   vitamin B-12 (CYANOCOBALAMIN) 500 MCG tablet Take 500 mcg by mouth daily.     Allergies:   Adhesive [tape], Baclofen, Lyrica [pregabalin], and Sulfa antibiotics   Social History   Socioeconomic History   Marital status: Widowed    Spouse name: Not on file   Number of children: 5   Years of education: 12th   Highest education level: High school graduate  Occupational History   Occupation: Retired  Tobacco Use   Smoking status: Never    Passive exposure: Never   Smokeless tobacco: Never  Vaping Use   Vaping Use: Never used  Substance and Sexual Activity   Alcohol use: Not Currently   Drug use: Never   Sexual activity: Not Currently  Other Topics Concern   Not on file  Social History Narrative   Lives at home with her daughters.   Right-handed.   Two cups caffeine per day.      02/10/21   From: [place] - moved to [facility] 2004   Living: with daughter [name] and Ronny Bacon granddaughter and [name] (daughter)   Work: retired - daycare       Family: Lives with daughters - [name] and [name], 3 other children across the country - too many to count ~15, a few great-grandchildren      Enjoys: play cards, word search books, watch TV, relaxing outside      Exercise: not currently   Diet: good appetite, cauliflower, chicken, likes most things      Safety   Seat belts: Yes    Guns: No   Safe in relationships: Yes    Social Determinants of Radio broadcast assistant Strain: Not on file  Food Insecurity: Not on file  Transportation Needs: Not on file  Physical Activity: Not on file  Stress: Not on file  Social Connections: Not on file     Family History: The patient's family history includes Other in her mother; Stomach cancer in her brother; Ulcers in her father.  ROS:   Please see the history of present illness.     All other systems reviewed and are negative.  EKGs/Labs/Other Studies  Reviewed:    The following studies were reviewed today:   EKG:  EKG is  ordered today.  EKG shows atrial paced rhythm.  Recent Labs: 08/29/2022: TSH 2.263 09/02/2022: Magnesium 2.1 11/08/2022: ALT 9; BUN 20; Creatinine, Ser 0.83; Hemoglobin 10.1; Platelets 176.0; Potassium 5.1; Sodium 136  Recent Lipid Panel    Component Value Date/Time   CHOL 203 (H) 08/11/2022 1216   TRIG 87.0 08/11/2022 1216   HDL 89.30 08/11/2022 1216   CHOLHDL 2 08/11/2022 1216   VLDL 17.4 08/11/2022 1216   LDLCALC 96 08/11/2022 1216    Physical Exam:    VS:  BP (!) 130/90 (BP Location: Left Arm, Patient Position: Sitting, Cuff Size: Normal)   Pulse 69  Ht '5\' 2"'$  (1.575 m)   Wt 155 lb 3.2 oz (70.4 kg)   SpO2 97%   BMI 28.39 kg/m     Wt Readings from Last 3 Encounters:  02/24/23 155 lb 3.2 oz (70.4 kg)  02/09/23 158 lb 2 oz (71.7 kg)  02/08/23 158 lb 2 oz (71.7 kg)     GEN:  Well nourished, well developed in no acute distress HEENT: Normal NECK: No JVD; No carotid bruits CARDIAC: RRR, 1/6 systolic murmur RESPIRATORY:  Clear to auscultation without rales, wheezing or rhonchi  ABDOMEN: Soft, non-tender, non-distended MUSCULOSKELETAL:  No edema; No deformity  SKIN: Warm and dry NEUROLOGIC:  Alert and oriented x 3 PSYCHIATRIC:  Normal affect   ASSESSMENT:    1. Orthostatic hypotension   2. Sinus node dysfunction (HCC)    PLAN:    In order of problems listed above:  dizziness, orthostatic hypotension, currently resolved, previously on midodrine, this was titrated off.  BP slightly elevated but acceptable, continue Lopressor 12.5 mg twice daily. Hx of bradycardia, sick sinus syndrome s/p pacemaker.  keep appointments with device clinic for frequent pacemaker checks.   . Follow-up in 1 year  This note was generated in part or whole with voice recognition software. Voice recognition is usually quite accurate but there are transcription errors that can and very often do occur. I apologize for any  typographical errors that were not detected and corrected.  Medication Adjustments/Labs and Tests Ordered: Current medicines are reviewed at length with the patient today.  Concerns regarding medicines are outlined above.  Orders Placed This Encounter  Procedures   EKG 12-Lead   No orders of the defined types were placed in this encounter.   Patient Instructions  Medication Instructions:   Your physician recommends that you continue on your current medications as directed. Please refer to the Current Medication list given to you today.  *If you need a refill on your cardiac medications before your next appointment, please call your pharmacy*   Lab Work:  None Ordered  If you have labs (blood work) drawn today and your tests are completely normal, you will receive your results only by: [facility] (if you have MyChart) OR A paper copy in the mail If you have any lab test that is abnormal or we need to change your treatment, we will call you to review the results.   Testing/Procedures:  None Ordered    Follow-Up: At [facility], you and your health needs are our priority.  As part of our continuing mission to provide you with exceptional heart care, we have created designated Provider Care Teams.  These Care Teams include your primary Cardiologist (physician) and Advanced Practice Providers (APPs -  Physician Assistants and Nurse Practitioners) who all work together to provide you with the care you need, when you need it.  We recommend signing up for the patient portal called "MyChart".  Sign up information is provided on this After Visit Summary.  MyChart is used to connect with patients for Virtual Visits (Telemedicine).  Patients are able to view lab/test results, encounter notes, upcoming appointments, etc.  Non-urgent messages can be sent to your provider as well.   To learn more about what you can do with MyChart, go to [email or web address].    Your next  appointment:   12 month(s)  Provider:   You may see Kate Sable, MD or one of the following Advanced Practice Providers on your designated Care Team:   Murray Hodgkins, NP Thurmond Butts  Dunn, PA-C Cadence Furth, PA-C Gerrie Nordmann, NP    Signed, Kate Sable, MD  02/24/2023 10:53 AM    [facility]

## 2023-03-04 ENCOUNTER — Other Ambulatory Visit: Payer: Self-pay | Admitting: Gastroenterology

## 2023-03-18 ENCOUNTER — Emergency Department
Admission: EM | Admit: 2023-03-18 | Discharge: 2023-03-19 | Disposition: A | Discharge status: Home / Self Care | Payer: Medicare Other | Attending: Emergency Medicine | Admitting: Emergency Medicine

## 2023-03-18 ENCOUNTER — Other Ambulatory Visit: Payer: Self-pay

## 2023-03-18 DIAGNOSIS — R42 Dizziness and giddiness: Secondary | ICD-10-CM | POA: Diagnosis not present

## 2023-03-18 DIAGNOSIS — R519 Headache, unspecified: Secondary | ICD-10-CM | POA: Diagnosis not present

## 2023-03-18 DIAGNOSIS — F333 Major depressive disorder, recurrent, severe with psychotic symptoms: Secondary | ICD-10-CM | POA: Insufficient documentation

## 2023-03-18 DIAGNOSIS — N189 Chronic kidney disease, unspecified: Secondary | ICD-10-CM | POA: Insufficient documentation

## 2023-03-18 DIAGNOSIS — M1990 Unspecified osteoarthritis, unspecified site: Secondary | ICD-10-CM | POA: Insufficient documentation

## 2023-03-18 DIAGNOSIS — N390 Urinary tract infection, site not specified: Secondary | ICD-10-CM | POA: Insufficient documentation

## 2023-03-18 DIAGNOSIS — K2270 Barrett's esophagus without dysplasia: Secondary | ICD-10-CM | POA: Diagnosis not present

## 2023-03-18 DIAGNOSIS — I2510 Atherosclerotic heart disease of native coronary artery without angina pectoris: Secondary | ICD-10-CM | POA: Insufficient documentation

## 2023-03-18 DIAGNOSIS — I129 Hypertensive chronic kidney disease with stage 1 through stage 4 chronic kidney disease, or unspecified chronic kidney disease: Secondary | ICD-10-CM | POA: Insufficient documentation

## 2023-03-18 DIAGNOSIS — D509 Iron deficiency anemia, unspecified: Secondary | ICD-10-CM | POA: Insufficient documentation

## 2023-03-18 DIAGNOSIS — R251 Tremor, unspecified: Secondary | ICD-10-CM | POA: Insufficient documentation

## 2023-03-18 DIAGNOSIS — G3184 Mild cognitive impairment, so stated: Secondary | ICD-10-CM | POA: Insufficient documentation

## 2023-03-18 DIAGNOSIS — R45851 Suicidal ideations: Secondary | ICD-10-CM | POA: Insufficient documentation

## 2023-03-18 LAB — URINE DRUG SCREEN, QUALITATIVE (ARMC ONLY)
Amphetamines, Ur Screen: NOT DETECTED
Barbiturates, Ur Screen: POSITIVE — AB
Benzodiazepine, Ur Scrn: NOT DETECTED
Cannabinoid 50 Ng, Ur ~~LOC~~: NOT DETECTED
Cocaine Metabolite,Ur ~~LOC~~: NOT DETECTED
MDMA (Ecstasy)Ur Screen: NOT DETECTED
Methadone Scn, Ur: NOT DETECTED
Opiate, Ur Screen: NOT DETECTED
Phencyclidine (PCP) Ur S: NOT DETECTED
Tricyclic, Ur Screen: NOT DETECTED

## 2023-03-18 LAB — COMPREHENSIVE METABOLIC PANEL
ALT: 13 U/L (ref 0–44)
AST: 23 U/L (ref 15–41)
Albumin: 4.1 g/dL (ref 3.5–5.0)
Alkaline Phosphatase: 58 U/L (ref 38–126)
Anion gap: 10 (ref 5–15)
BUN: 22 mg/dL (ref 8–23)
CO2: 25 mmol/L (ref 22–32)
Calcium: 9.8 mg/dL (ref 8.9–10.3)
Chloride: 101 mmol/L (ref 98–111)
Creatinine, Ser: 0.85 mg/dL (ref 0.44–1.00)
GFR, Estimated: >60 mL/min (ref >60)
Glucose, Bld: 125 mg/dL — ABNORMAL HIGH (ref 70–99)
Potassium: 3.7 mmol/L (ref 3.5–5.1)
Sodium: 136 mmol/L (ref 135–145)
Total Bilirubin: 0.5 mg/dL (ref 0.3–1.2)
Total Protein: 7.6 g/dL (ref 6.5–8.1)

## 2023-03-18 LAB — CBC
HCT: 39.2 % (ref 36.0–46.0)
Hemoglobin: 12.2 g/dL (ref 12.0–15.0)
MCH: 25.6 pg — ABNORMAL LOW (ref 26.0–34.0)
MCHC: 31.1 g/dL (ref 30.0–36.0)
MCV: 82.2 fL (ref 80.0–100.0)
Platelets: 177 10*3/uL (ref 150–400)
RBC: 4.77 MIL/uL (ref 3.87–5.11)
RDW: 18.6 % — ABNORMAL HIGH (ref 11.5–15.5)
WBC: 6.7 10*3/uL (ref 4.0–10.5)
nRBC: 0 % (ref 0.0–0.2)

## 2023-03-18 LAB — ACETAMINOPHEN LEVEL: Acetaminophen (Tylenol), Serum: <10 ug/mL — ABNORMAL LOW (ref 10–30)

## 2023-03-18 LAB — ETHANOL: Alcohol, Ethyl (B): <10 mg/dL (ref <10)

## 2023-03-18 LAB — SALICYLATE LEVEL: Salicylate Lvl: <7 mg/dL — ABNORMAL LOW (ref 7.0–30.0)

## 2023-03-18 MED ORDER — BUSPIRONE HCL 5 MG PO TABS
10.0000 mg | ORAL_TABLET | Freq: Two times a day (BID) | ORAL | Status: DC
  Administered 2023-03-18 – 2023-03-19 (×2): 10 mg via ORAL
  Filled 2023-03-18 (×2): qty 2

## 2023-03-18 MED ORDER — PANTOPRAZOLE SODIUM 40 MG PO TBEC
40.0000 mg | DELAYED_RELEASE_TABLET | Freq: Every day | ORAL | Status: DC
  Administered 2023-03-19: 40 mg via ORAL
  Filled 2023-03-18: qty 1

## 2023-03-18 MED ORDER — PRIMIDONE 50 MG PO TABS
100.0000 mg | ORAL_TABLET | Freq: Every day | ORAL | Status: DC
  Administered 2023-03-19: 100 mg via ORAL
  Filled 2023-03-18: qty 2

## 2023-03-18 MED ORDER — NITROFURANTOIN MACROCRYSTAL 50 MG PO CAPS
100.0000 mg | ORAL_CAPSULE | Freq: Every day | ORAL | Status: DC
  Administered 2023-03-19: 100 mg via ORAL
  Filled 2023-03-18: qty 2

## 2023-03-18 MED ORDER — ARIPIPRAZOLE 5 MG PO TABS
5.0000 mg | ORAL_TABLET | Freq: Every morning | ORAL | Status: DC
  Administered 2023-03-19: 5 mg via ORAL
  Filled 2023-03-18: qty 1

## 2023-03-18 MED ORDER — MEMANTINE HCL 5 MG PO TABS
10.0000 mg | ORAL_TABLET | Freq: Two times a day (BID) | ORAL | Status: DC
  Administered 2023-03-18 – 2023-03-19 (×2): 10 mg via ORAL
  Filled 2023-03-18 (×2): qty 2

## 2023-03-18 MED ORDER — METOPROLOL TARTRATE 25 MG PO TABS
12.5000 mg | ORAL_TABLET | Freq: Two times a day (BID) | ORAL | Status: DC
  Administered 2023-03-18 – 2023-03-19 (×2): 12.5 mg via ORAL
  Filled 2023-03-18 (×2): qty 1

## 2023-03-18 MED ORDER — ACETAMINOPHEN 500 MG PO TABS
1000.0000 mg | ORAL_TABLET | Freq: Two times a day (BID) | ORAL | Status: DC
  Administered 2023-03-18 – 2023-03-19 (×2): 1000 mg via ORAL
  Filled 2023-03-18 (×2): qty 2

## 2023-03-18 MED ORDER — ATORVASTATIN CALCIUM 20 MG PO TABS
10.0000 mg | ORAL_TABLET | Freq: Every day | ORAL | Status: DC
  Administered 2023-03-18: 10 mg via ORAL
  Filled 2023-03-18: qty 1

## 2023-03-18 MED ORDER — GABAPENTIN 300 MG PO CAPS
400.0000 mg | ORAL_CAPSULE | Freq: Two times a day (BID) | ORAL | Status: DC
  Administered 2023-03-18 – 2023-03-19 (×2): 400 mg via ORAL
  Filled 2023-03-18 (×2): qty 1

## 2023-03-18 MED ORDER — MELATONIN 5 MG PO TABS
5.0000 mg | ORAL_TABLET | Freq: Every day | ORAL | Status: DC
  Administered 2023-03-18: 5 mg via ORAL
  Filled 2023-03-18: qty 1

## 2023-03-18 MED ORDER — VITAMIN D 25 MCG (1000 UNIT) PO TABS
1000.0000 [IU] | ORAL_TABLET | Freq: Every day | ORAL | Status: DC
  Administered 2023-03-19: 1000 [IU] via ORAL
  Filled 2023-03-18: qty 1

## 2023-03-18 MED ORDER — VENLAFAXINE HCL ER 150 MG PO CP24
150.0000 mg | ORAL_CAPSULE | Freq: Every day | ORAL | Status: DC
  Administered 2023-03-19: 150 mg via ORAL
  Filled 2023-03-18: qty 1

## 2023-03-18 MED ORDER — LORAZEPAM 0.5 MG PO TABS: 0.5000 mg | ORAL_TABLET | Freq: Two times a day (BID) | ORAL | Status: DC | PRN

## 2023-03-18 NOTE — ED Triage Notes (Signed)
Pt presents to ED with daughter who pt resides with c/o of SI, pt states she cut herself yesterday with a pair of scissors. Pt has small abrasion to L wrist. Daughter states pt started to escalate the other day when it got cold out and pt started to argue with daughter that pt insisted someone else was making it cold.   Daughter gave 0.5 mg of ativan at 1630 today.

## 2023-03-18 NOTE — ED Notes (Signed)
Pt very concerned about her night time medications. This RN informed pt that the doctors are working to decide if she will stay here or if she will go home tonight. If pt stays the night she will get her nighttime medications.

## 2023-03-18 NOTE — ED Provider Notes (Addendum)
[facility] Provider Note    Event Date/Time   First MD Initiated Contact with Patient 03/18/23 1835     (approximate)   History   Suicidal   HPI  Regina Hernandez is a 89 y.o. female past medical history significant for hypertension, CKD, depression, who presents to the emergency department suicidal ideation.  States that she started having thoughts of suicide today.  States that her family members who she lives with keep her room to cold in the bathroom too cold.  Today she was so cold in the air was blowing cold air into her room that she had thoughts of suicide.  States that she has had a suicide attempt in the past by attempting to overdose on sleeping pills.  Does not have a plan for suicide.  Lives with her 2 daughters and her granddaughter.  States that she has been compliant with all of her home medications.     Physical Exam   Triage Vital Signs: ED Triage Vitals [03/18/23 1723]  Enc Vitals Group     BP (!) 161/130     Pulse Rate (!) 104     Resp 19     Temp 97.7 F (36.5 C)     Temp Source Oral     SpO2 96 %     Weight      Height      Head Circumference      Peak Flow      Pain Score      Pain Loc      Pain Edu?      Excl. in [place]?     Most recent vital signs: Vitals:   03/18/23 1723 03/18/23 2017  BP: (!) 161/130 (!) 143/103  Pulse: (!) 104 90  Resp: 19 16  Temp: 97.7 F (36.5 C) 98.6 F (37 C)  SpO2: 96% 94%    Physical Exam Constitutional:      Appearance: She is well-developed.  HENT:     Head: Atraumatic.  Eyes:     Conjunctiva/sclera: Conjunctivae normal.  Cardiovascular:     Rate and Rhythm: Regular rhythm.  Pulmonary:     Effort: No respiratory distress.  Abdominal:     General: There is no distension.  Musculoskeletal:        General: Normal range of motion.     Cervical back: Normal range of motion.  Skin:    General: Skin is warm.  Neurological:     Mental Status: She is alert. Mental status is at  baseline.  Psychiatric:        Attention and Perception: Attention normal.        Mood and Affect: Mood normal.        Speech: Speech normal.        Behavior: Behavior is cooperative.        Thought Content: Thought content includes suicidal ideation. Thought content does not include suicidal plan.     IMPRESSION / MDM / ASSESSMENT AND PLAN / ED COURSE  I reviewed the triage vital signs and the nursing notes.  Patient presents to the emergency department with suicidal ideation.  According to daughter patient had a pair of scissors with her with plans to cut herself.     LABS (all labs ordered are listed, but only abnormal results are displayed) Labs interpreted as -    Labs Reviewed  COMPREHENSIVE METABOLIC PANEL - Abnormal; Notable for the following components:      Result Value  Glucose, Bld 125 (*)    All other components within normal limits  SALICYLATE LEVEL - Abnormal; Notable for the following components:   Salicylate Lvl [id number] (*)    All other components within normal limits  ACETAMINOPHEN LEVEL - Abnormal; Notable for the following components:   Acetaminophen (Tylenol), Serum <10 (*)    All other components within normal limits  CBC - Abnormal; Notable for the following components:   MCH 25.6 (*)    RDW 18.6 (*)    All other components within normal limits  URINE DRUG SCREEN, QUALITATIVE ([facility] ONLY) - Abnormal; Notable for the following components:   Barbiturates, Ur Screen POSITIVE (*)    All other components within normal limits  ETHANOL    MDM  No significant electrolyte abnormalities.  No signs of dehydration.  Consulted psychiatry for evaluation.   Psychiatry discussed with her daughter, they were concerned that they cannot keep her safe at home given her delusions.  Psychiatry recommended reevaluation tomorrow.  Will reorder her home medications.  The patient has been placed in psychiatric observation due to the need to provide a safe environment for  the patient while obtaining psychiatric consultation and evaluation, as well as ongoing medical and medication management to treat the patient's condition.  The patient has not been placed under full IVC at this time.   PROCEDURES:  Critical Care performed: No  Procedures  Patient's presentation is most consistent with acute presentation with potential threat to life or bodily function.   MEDICATIONS ORDERED IN ED: Medications - No data to display  FINAL CLINICAL IMPRESSION(S) / ED DIAGNOSES   Final diagnoses:  Suicidal ideation     Rx / DC Orders   ED Discharge Orders     None        Note:  This document was prepared using Dragon voice recognition software and may include unintentional dictation errors.   Nathaniel Man, MD 03/18/23 [id number]    Nathaniel Man, MD 03/18/23 2211

## 2023-03-18 NOTE — ED Notes (Signed)
Pt concerned about her night time medications, this RN informed ERP. ERP stated that we will have psych evaluate pt prior to giving pt night time medications at this time. Pt informed and understands plan at this time.

## 2023-03-18 NOTE — ED Notes (Signed)
Pt assisted to use bathroom and given night time medications. Lights turned out and pt denies other needs at this time

## 2023-03-18 NOTE — ED Notes (Signed)
Pt belongings:   Purple pajama top and pajama pants with starts and moon on them [employer] sneakers Pill box Jewelry  Daughter taking belongings home

## 2023-03-18 NOTE — BH Assessment (Addendum)
Comprehensive Clinical Assessment (CCA) Screening, Triage and Referral Note  03/18/2023 Regina Hernandez [id number] Recommendations for Services/Supports/Treatments: Consulted with Rashaun D., NP, who recommended pt. continue to be observed overnight and reassessed this AM.   Regina Hernandez is an 89 year old, English speaking, Caucasian female with a hx of MDD, severe. Pt presented to [facility] ED voluntarily, BIB daughter with complaints of SI and depression. Per patient report, her main stressors are "I keep telling my daughter that there is cold air blasting in my room and my daughters won't listen to me!". Pt became emotionally reactive when explaining that her room has cold air coming from her bathroom. Pt expressed a belief that her family members are intentionally causing the colder air to blow on her. Pt endorsed worsening anxiety, depression, and feelings of hopelessness about her current quality of life. Pt endorsed having passive SI without a plan. Pt became agitated explaining that she would be happier if only she had a heater with an adjustable thermostat in her room. Pt did not give a concrete plan for SI. Pt expressed a desire for a medication adjustment as she does not feel that her antidepressant is effective. Pt had poor insight and dangerous judgement. Pt showed this writer a superficial cut on her left wrist, explaining that she'd gotten so angry last week that she self-injured with scissors. Pt had normal speech and her thoughts were relevant. Pt was cooperative throughout the assessment. Pt presented with an appropriate mood and a congruent affect. Pt presents with a neat appearance. Pt denies HI and AV/H.   Collateral: Maurine Minister (Daughter) [phone] Daughter reported that the pt became agitated earlier in the evening and was unable to be redirected even after receiving Ativan PRN. Daughter explained that the pt got upset, ripped the heater chord out of the wall, and began swinging it.  Daughter reported that the pt has delusions that her family members are intentionally blowing cold air on her.  Chief Complaint:  Chief Complaint  Patient presents with   Suicidal   Visit Diagnosis: Depression   Arthritis   Chronic headaches   Coronary atherosclerosis   Barrett's esophagus without dysplasia   History of suicide attempt   Iron deficiency anemia   Major depression with psychotic features ([place])   Recurrent UTI   Tremor   Vertigo   Mild cognitive impairment  Patient Reported Information How did you hear about [place]? Family/Friend  What Is the Reason for Your Visit/Call Today? Pt presents to ED with daughter who pt resides with c/o of SI, pt states she cut herself yesterday with a pair of scissors. Pt has small abrasion to L wrist. Daughter states pt started to escalate the other day when it got cold out and pt started to argue with daughter that pt insisted someone else was making it cold.  How Long Has This Been Causing You Problems? 1 wk - 1 month  What Do You Feel Would Help You the Most Today? Treatment for Depression or other mood problem; Medication(s)   Have You Recently Had Any Thoughts About Hurting Yourself? Yes  Are You Planning to Commit Suicide/Harm Yourself At This time? No   Have you Recently Had Thoughts About [place]? No  Are You Planning to Harm Someone at This Time? No  Explanation: No data recorded  Have You Used Any Alcohol or Drugs in the Past 24 Hours? No  How Long Ago Did You Use Drugs or Alcohol? No data recorded What Did You Use and  How Much? n/a   Do You Currently Have a Therapist/Psychiatrist? Yes  Name of Therapist/Psychiatrist: Unable to recall   Have You Been Recently Discharged From Any Office Practice or Programs? No  Explanation of Discharge From Practice/Program: n/a    CCA Screening Triage Referral Assessment Type of Contact: Face-to-Face  Telemedicine Service Delivery:   Is this Initial or  Reassessment?   Date Telepsych consult ordered in [facility]:    Time Telepsych consult ordered in [facility]:    Location of Assessment: [facility] ED  Provider Location: [facility] ED    Collateral Involvement: Maurine Minister (Daughter) [phone]   Does Patient Have a [place]? No data recorded Name and Contact of Legal Guardian: No data recorded If Minor and Not Living with Parent(s), Who has Custody? n/a  Is CPS involved or ever been involved? Never  Is APS involved or ever been involved? Never   Patient Determined To Be At Risk for Harm To Self or Others Based on Review of Patient Reported Information or Presenting Complaint? No  Method: No Plan  Availability of Means: No access or NA  Intent: Vague intent or NA  Notification Required: No need or identified person  Additional Information for Danger to Others Potential: -- (n/a)  Additional Comments for Danger to Others Potential: n/a  Are There Guns or Other Weapons in Your Home? No  Types of Guns/Weapons: n/a  Are These Weapons Safely Secured?                            -- (n/a)  Who Could Verify You Are Able To Have These Secured: n/a  Do You Have any Outstanding Charges, Pending Court Dates, Parole/Probation? n/a  Contacted To Inform of Risk of Harm To Self or Others: Other: Comment   Does Patient Present under Involuntary Commitment? No    [place] of Residence: [place]   Patient Currently Receiving the Following Services: Medication Management   Determination of Need: Emergent (2 hours)   Options For Referral: ED Visit   Discharge Disposition:     Nickolaos Brallier R Shyloh Krinke, LCAS

## 2023-03-19 DIAGNOSIS — G3184 Mild cognitive impairment, so stated: Secondary | ICD-10-CM | POA: Diagnosis not present

## 2023-03-19 NOTE — BH Assessment (Signed)
Writer spoke with the patient to complete an updated/reassessment. Patient denies SI/HI and AV/H.  Spoke with the patient's daughter [name]), and she shared the patient had a cup a coffee early that morning and had been up for longer than she's use to. She believes that had a great deal to do what is going on. She aware and agrees with the patient discharging and following up with her outpatient provider.

## 2023-03-19 NOTE — ED Notes (Signed)
Pt given a breakfast tray.

## 2023-03-19 NOTE — ED Notes (Signed)
This RN talked with pt daughter and updated her on plan to reassess pt with psych this morning and will have on coming RN reach out with further information

## 2023-03-19 NOTE — ED Notes (Signed)
Pt given TV remote and informed plan for this morning, pt resting comfortably.

## 2023-03-19 NOTE — ED Notes (Signed)
Pt to be discharged home. Pt's daughter to pick her up at noon, per Provider Roselyn Reef.

## 2023-03-19 NOTE — ED Provider Notes (Signed)
Patient was seen by psychiatry and is clear.  They have spoke with patient's daughter.   Rada Hay, MD 03/19/23 [phone]

## 2023-03-19 NOTE — Consult Note (Signed)
[facility] Face-to-Face Psychiatry Consult   Reason for Consult:  delusions Referring Physician:  EDP Patient Identification: Regina Hernandez MRN:  [id number] Principal Diagnosis: Mild cognitive impairment with memory loss Diagnosis:  Principal Problem:   Mild cognitive impairment with memory loss   Total Time spent with patient: 45 minutes  Subjective:   Regina Hernandez is a 89 y.o. female patient admitted with delusions after poor sleep.  HPI:  89 yo female presented to the ED with delusions of her family pushing cold air on her and became agitated.  This morning she reported, "I'm doing good, I'm better" after she slept.  Denies suicidal/homicidal ideations, hallucinations, and substance abuse.  She wants to return home where her two daughters live and her granddaughter.  Her POA, Maurine Minister, called and stated she discovered her mother had not slept well the night before and was delusional related to lack of sleep.  She sees Dr. Casimiro Needle, psychiatrist, and will return there for follow up.  Her daughter has not issues or concerns about her returning home, psych cleared.  Past Psychiatric History: dementia, depression  Risk to Self:  none Risk to Others:  none Prior Inpatient Therapy:  none Prior Outpatient Therapy:  [facility], Dr. Casimiro Needle  Past Medical History:  Past Medical History:  Diagnosis Date   Anxiety    Cervical dystonia    Essential tremor    GI bleed    Memory loss    Migraine    Occipital neuralgia     Past Surgical History:  Procedure Laterality Date   BOWEL RESECTION     CARDIAC CATHETERIZATION  2016   no stents/[facility]   ESOPHAGOGASTRODUODENOSCOPY N/A 06/18/2020   Procedure: ESOPHAGOGASTRODUODENOSCOPY (EGD);  Surgeon: Toledo, Benay Pike, MD;  Location: [facility] ENDOSCOPY;  Service: Gastroenterology;  Laterality: N/A;   ESOPHAGOGASTRODUODENOSCOPY N/A 08/29/2022   Procedure: ESOPHAGOGASTRODUODENOSCOPY (EGD);  Surgeon: Daryel November, MD;  Location:  [facility];  Service: Gastroenterology;  Laterality: N/A;   ESOPHAGOGASTRODUODENOSCOPY N/A 08/27/2022   Procedure: ESOPHAGOGASTRODUODENOSCOPY (EGD);  Surgeon: Toledo, Benay Pike, MD;  Location: [facility] ENDOSCOPY;  Service: Gastroenterology;  Laterality: N/A;   ESOPHAGOGASTRODUODENOSCOPY (EGD) WITH PROPOFOL N/A 08/28/2022   Procedure: ESOPHAGOGASTRODUODENOSCOPY (EGD) WITH PROPOFOL;  Surgeon: Toledo, Benay Pike, MD;  Location: [facility] ENDOSCOPY;  Service: Gastroenterology;  Laterality: N/A;   IR ANGIOGRAM VISCERAL SELECTIVE  08/29/2022   IR ANGIOGRAM VISCERAL SELECTIVE  08/29/2022   IR ANGIOGRAM VISCERAL SELECTIVE  08/29/2022   IR AORTAGRAM ABDOMINAL SERIALOGRAM  08/29/2022   IR EMBO ART  VEN HEMORR LYMPH EXTRAV  INC GUIDE ROADMAPPING  08/29/2022   IR US GUIDE VASC ACCESS RIGHT  08/29/2022   PACEMAKER IMPLANT     REPLACEMENT TOTAL KNEE Left    Family History:  Family History  Problem Relation Age of Onset   Other Mother        "old age"   Ulcers Father    Stomach cancer Brother    Family Psychiatric  History: none Social History:  Social History   Substance and Sexual Activity  Alcohol Use Not Currently     Social History   Substance and Sexual Activity  Drug Use Never    Social History   Socioeconomic History   Marital status: Widowed    Spouse name: Not on file   Number of children: 5   Years of education: 12th   Highest education level: High school graduate  Occupational History   Occupation: Retired  Tobacco Use   Smoking status: Never  Passive exposure: Never   Smokeless tobacco: Never  Vaping Use   Vaping Use: Never used  Substance and Sexual Activity   Alcohol use: Not Currently   Drug use: Never   Sexual activity: Not Currently  Other Topics Concern   Not on file  Social History Narrative   Lives at home with her daughters.   Right-handed.   Two cups caffeine per day.      02/10/21   From: [place] - moved to [facility] [place]   Living: with daughter [name] and Ronny Bacon  granddaughter and [name] (daughter)   Work: retired - daycare       Family: Lives with daughters - [name] and [name], 3 other children across the country - too many to count ~15, a few great-grandchildren      Enjoys: play cards, word search books, watch TV, relaxing outside      Exercise: not currently   Diet: good appetite, cauliflower, chicken, likes most things      Safety   Seat belts: Yes    Guns: No   Safe in relationships: Yes    Social Determinants of Radio broadcast assistant Strain: Not on file  Food Insecurity: Not on file  Transportation Needs: Not on file  Physical Activity: Not on file  Stress: Not on file  Social Connections: Not on file   Additional Social History:    Allergies:   Allergies  Allergen Reactions   Adhesive [Tape] Other (See Comments)    Contact dermatitis   Baclofen Rash   Lyrica [Pregabalin] Other (See Comments)    Unable to remember reaction.   Sulfa Antibiotics Rash    Labs:  Results for orders placed or performed during the hospital encounter of 03/18/23 (from the past 48 hour(s))  Urine Drug Screen, Qualitative     Status: Abnormal   Collection Time: 03/18/23  5:25 PM  Result Value Ref Range   Tricyclic, Ur Screen NONE DETECTED NONE DETECTED   Amphetamines, Ur Screen NONE DETECTED NONE DETECTED   MDMA (Ecstasy)Ur Screen NONE DETECTED NONE DETECTED   Cocaine Metabolite,Ur [place] NONE DETECTED NONE DETECTED   Opiate, Ur Screen NONE DETECTED NONE DETECTED   Phencyclidine (PCP) Ur S NONE DETECTED NONE DETECTED   Cannabinoid 50 Ng, Ur [place] NONE DETECTED NONE DETECTED   Barbiturates, Ur Screen POSITIVE (A) NONE DETECTED   Benzodiazepine, Ur Scrn NONE DETECTED NONE DETECTED   Methadone Scn, Ur NONE DETECTED NONE DETECTED    Comment: (NOTE) Tricyclics + metabolites, urine    Cutoff 1000 ng/mL Amphetamines + metabolites, urine  Cutoff 1000 ng/mL MDMA (Ecstasy), urine              Cutoff 500 ng/mL Cocaine Metabolite, urine          Cutoff 300  ng/mL Opiate + metabolites, urine        Cutoff 300 ng/mL Phencyclidine (PCP), urine         Cutoff 25 ng/mL Cannabinoid, urine                 Cutoff 50 ng/mL Barbiturates + metabolites, urine  Cutoff 200 ng/mL Benzodiazepine, urine              Cutoff 200 ng/mL Methadone, urine                   Cutoff 300 ng/mL  The urine drug screen provides only a preliminary, unconfirmed analytical test result and should not be used for non-medical purposes. Clinical consideration  and professional judgment should be applied to any positive drug screen result due to possible interfering substances. A more specific alternate chemical method must be used in order to obtain a confirmed analytical result. Gas chromatography / mass spectrometry (GC/MS) is the preferred confirm atory method. Performed at [facility], [address] [place], [place] [address]   Comprehensive metabolic panel     Status: Abnormal   Collection Time: 03/18/23  5:27 PM  Result Value Ref Range   Sodium 136 135 - 145 mmol/L   Potassium 3.7 3.5 - 5.1 mmol/L   Chloride 101 98 - 111 mmol/L   CO2 25 22 - 32 mmol/L   Glucose, Bld 125 (H) 70 - 99 mg/dL    Comment: Glucose reference range applies only to samples taken after fasting for at least 8 hours.   BUN 22 8 - 23 mg/dL   Creatinine, Ser 0.85 0.44 - 1.00 mg/dL   Calcium 9.8 8.9 - 10.3 mg/dL   Total Protein 7.6 6.5 - 8.1 g/dL   Albumin 4.1 3.5 - 5.0 g/dL   AST 23 15 - 41 U/L   ALT 13 0 - 44 U/L   Alkaline Phosphatase 58 38 - 126 U/L   Total Bilirubin 0.5 0.3 - 1.2 mg/dL   GFR, Estimated >60 >60 mL/min    Comment: (NOTE) Calculated using the CKD-EPI Creatinine Equation (2021)    Anion gap 10 5 - 15    Comment: Performed at [facility], [address], [place], [place] [address]  Ethanol     Status: None   Collection Time: 03/18/23  5:27 PM  Result Value Ref Range   Alcohol, Ethyl (B) <10 <10 mg/dL    Comment: (NOTE) Lowest detectable limit  for serum alcohol is 10 mg/dL.  For medical purposes only. Performed at [facility], [address] [place], [place] [id number]   Salicylate level     Status: Abnormal   Collection Time: 03/18/23  5:27 PM  Result Value Ref Range   Salicylate Lvl [id number] (L) 7.0 - 30.0 mg/dL    Comment: Performed at [facility], [address] [place], [place] [address]  Acetaminophen level     Status: Abnormal   Collection Time: 03/18/23  5:27 PM  Result Value Ref Range   Acetaminophen (Tylenol), Serum <10 (L) 10 - 30 ug/mL    Comment: (NOTE) Therapeutic concentrations vary significantly. A range of 10-30 ug/mL  may be an effective concentration for many patients. However, some  are best treated at concentrations outside of this range. Acetaminophen concentrations >150 ug/mL at 4 hours after ingestion  and >50 ug/mL at 12 hours after ingestion are often associated with  toxic reactions.  Performed at [facility], [address] [place], [place] [address]   cbc     Status: Abnormal   Collection Time: 03/18/23  5:27 PM  Result Value Ref Range   WBC 6.7 4.0 - 10.5 K/uL   RBC 4.77 3.87 - 5.11 MIL/uL   Hemoglobin 12.2 12.0 - 15.0 g/dL   HCT 39.2 36.0 - 46.0 %   MCV 82.2 80.0 - 100.0 fL   MCH 25.6 (L) 26.0 - 34.0 pg   MCHC 31.1 30.0 - 36.0 g/dL   RDW 18.6 (H) 11.5 - 15.5 %   Platelets 177 150 - 400 K/uL   nRBC 0.0 0.0 - 0.2 %    Comment: Performed at [facility], [address], [place], [place] [address]  Current Facility-Administered Medications  Medication Dose Route Frequency Provider Last Rate Last Admin   acetaminophen (TYLENOL) tablet 1,000 mg  1,000 mg Oral BID [place], Rashaun M, NP   1,000 mg at 03/19/23 0915   ARIPiprazole (ABILIFY) tablet 5 mg  5 mg Oral q morning [place], Rashaun M, NP   5 mg at 03/19/23 0915   atorvastatin (LIPITOR) tablet 10 mg  10 mg Oral QHS [place], Rashaun M, NP   10 mg at 03/18/23 2221   busPIRone (BUSPAR) tablet 10  mg  10 mg Oral BID Anette Riedel M, NP   10 mg at 03/19/23 0915   cholecalciferol (VITAMIN D3) 25 MCG (1000 UNIT) tablet 1,000 Units  1,000 Units Oral Daily Anette Riedel M, NP   1,000 Units at 03/19/23 0915   gabapentin (NEURONTIN) capsule 400 mg  400 mg Oral BID Anette Riedel M, NP   400 mg at 03/19/23 [id number]   LORazepam (ATIVAN) tablet 0.5 mg  0.5 mg Oral BID PRN Deloria Lair, NP       melatonin tablet 5 mg  5 mg Oral QHS [place], Rashaun M, NP   5 mg at 03/18/23 2229   memantine (NAMENDA) tablet 10 mg  10 mg Oral BID Anette Riedel M, NP   10 mg at 03/19/23 0915   metoprolol tartrate (LOPRESSOR) tablet 12.5 mg  12.5 mg Oral BID Anette Riedel M, NP   12.5 mg at 03/19/23 0914   nitrofurantoin (MACRODANTIN) capsule 100 mg  100 mg Oral Daily Anette Riedel M, NP   100 mg at 03/19/23 0915   pantoprazole (PROTONIX) EC tablet 40 mg  40 mg Oral Daily Anette Riedel M, NP   40 mg at 03/19/23 0914   primidone (MYSOLINE) tablet 100 mg  100 mg Oral Daily Anette Riedel M, NP   100 mg at 03/19/23 0915   venlafaxine XR (EFFEXOR-XR) 24 hr capsule 150 mg  150 mg Oral Q breakfast Anette Riedel M, NP   150 mg at 03/19/23 0805   Current Outpatient Medications  Medication Sig Dispense Refill   acetaminophen (TYLENOL) 500 MG tablet Take 1,000 mg by mouth 2 (two) times daily.     ARIPiprazole (ABILIFY) 5 MG tablet Take 5 mg by mouth every morning.     atorvastatin (LIPITOR) 10 MG tablet Take 1 tablet (10 mg total) by mouth daily. (Patient taking differently: Take 10 mg by mouth at bedtime.) 90 tablet 0   busPIRone (BUSPAR) 10 MG tablet Take 10 mg by mouth 2 (two) times daily.     cholecalciferol (VITAMIN D3) 25 MCG (1000 UNIT) tablet Take 1,000 Units by mouth daily.     docusate sodium (COLACE) 100 MG capsule Take 100 mg by mouth daily as needed for mild constipation.     gabapentin (NEURONTIN) 400 MG capsule Take 1 capsule (400 mg total) by mouth 2 (two) times daily. 60 capsule 3   LORazepam (ATIVAN) 0.5 MG  tablet Take 0.5 mg by mouth 2 (two) times daily as needed.     memantine (NAMENDA) 10 MG tablet TAKE 1 TABLET BY MOUTH TWICE A DAY 180 tablet 1   metoprolol tartrate (LOPRESSOR) 25 MG tablet Take 0.5 tablets (12.5 mg total) by mouth 2 (two) times daily. 90 tablet 1   nitrofurantoin (MACRODANTIN) 100 MG capsule Take 100 mg by mouth daily.     pantoprazole (PROTONIX) 40 MG tablet Take 1 tablet (40 mg total) by mouth daily. 90 tablet 3   primidone (MYSOLINE) 50 MG tablet  Take 100 mg by mouth daily.     venlafaxine XR (EFFEXOR XR) 150 MG 24 hr capsule Take 150 mg by mouth daily with breakfast.     vitamin B-12 (CYANOCOBALAMIN) 500 MCG tablet Take 500 mcg by mouth daily.      Musculoskeletal: Strength & Muscle Tone: within normal limits Gait & Station: normal Patient leans: N/A  Psychiatric Specialty Exam: Physical Exam Vitals and nursing note reviewed.  Constitutional:      Appearance: Normal appearance.  HENT:     Head: Normocephalic.     Nose: Nose normal.  Pulmonary:     Effort: Pulmonary effort is normal.  Musculoskeletal:        General: Normal range of motion.     Cervical back: Normal range of motion.  Neurological:     General: No focal deficit present.     Mental Status: She is alert and oriented to person, place, and time.  Psychiatric:        Attention and Perception: Attention and perception normal.        Mood and Affect: Mood and affect normal.        Speech: Speech normal.        Behavior: Behavior normal. Behavior is cooperative.        Thought Content: Thought content normal.        Cognition and Memory: Memory is impaired.        Judgment: Judgment normal.     Review of Systems  Psychiatric/Behavioral:  Positive for memory loss.   All other systems reviewed and are negative.   Blood pressure (!) 181/90, pulse 64, temperature 98.1 F (36.7 C), temperature source Oral, resp. rate 17, SpO2 96 %.There is no height or weight on file to calculate BMI.  General  Appearance: Casual  Eye Contact:  Good  Speech:  Normal Rate  Volume:  Normal  Mood:  Euthymic  Affect:  Congruent  Thought Process:  Coherent  Orientation:  Full (Time, Place, and Person)  Thought Content:  WDL and Logical  Suicidal Thoughts:  No  Homicidal Thoughts:  No  Memory:  Fair  Judgement:  Fair  Insight:  Fair  Psychomotor Activity:  Normal  Concentration:  Concentration: Good and Attention Span: Good  Recall:  [place] of Knowledge:  Good  Language:  Good  Akathisia:  No  Handed:  Right  AIMS (if indicated):     Assets:  Housing Physical Health Resilience Social Support  ADL's:  Intact  Cognition:  Impaired,  Mild  Sleep:        Physical Exam: Physical Exam Vitals and nursing note reviewed.  Constitutional:      Appearance: Normal appearance.  HENT:     Head: Normocephalic.     Nose: Nose normal.  Pulmonary:     Effort: Pulmonary effort is normal.  Musculoskeletal:        General: Normal range of motion.     Cervical back: Normal range of motion.  Neurological:     General: No focal deficit present.     Mental Status: She is alert and oriented to person, place, and time.  Psychiatric:        Attention and Perception: Attention and perception normal.        Mood and Affect: Mood and affect normal.        Speech: Speech normal.        Behavior: Behavior normal. Behavior is cooperative.  Thought Content: Thought content normal.        Cognition and Memory: Memory is impaired.        Judgment: Judgment normal.    Review of Systems  Psychiatric/Behavioral:  Positive for memory loss.   All other systems reviewed and are negative.  Blood pressure (!) 181/90, pulse 64, temperature 98.1 F (36.7 C), temperature source Oral, resp. rate 17, SpO2 96 %. There is no height or weight on file to calculate BMI.  Treatment Plan Summary: Dementia with behavioral changes: Continue medication protocol and follow up with outpatient  provider  Disposition: No evidence of imminent risk to self or others at present.   Patient does not meet criteria for psychiatric inpatient admission. Supportive therapy provided about ongoing stressors.  Waylan Boga, NP 03/19/2023 10:43 AM

## 2023-03-19 NOTE — ED Notes (Signed)
vol/pending consult/recommends pt to continue to be observed overnight & reassessed in the am..

## 2023-03-21 ENCOUNTER — Other Ambulatory Visit: Payer: Self-pay | Admitting: Physician Assistant

## 2023-03-24 ENCOUNTER — Telehealth: Payer: Self-pay | Admitting: *Deleted

## 2023-03-24 NOTE — Telephone Encounter (Signed)
        Patient  visited [facility] on 03/21/2023  for treatment    Telephone encounter attempt :  1st  A HIPAA compliant voice message was left requesting a return call.  Instructed patient to call back at [phone]. [place] [phone] [address] , [place] [address] Email : [email or web address]

## 2023-03-30 ENCOUNTER — Telehealth: Payer: Self-pay | Admitting: *Deleted

## 2023-03-30 NOTE — Telephone Encounter (Signed)
     Patient  visit on 03/19/2023  at [facility] was for treatment   Have you beenpatients daughter confirmed she has reached out to dr and had medication changes and is doing better at this time nad has access to her medication and transportation  able to follow up with your primary care physician?  The patient was able to obtain any needed medicine or equipment.  Are there diet recommendations that you are having difficulty following?  Patient expresses understanding of discharge instructions and education provided has no other needs at this time.    [place] [phone] [address] , [place] [address] Email : [email or web address]

## 2023-04-01 ENCOUNTER — Ambulatory Visit (INDEPENDENT_AMBULATORY_CARE_PROVIDER_SITE_OTHER): Payer: Medicare Other

## 2023-04-01 DIAGNOSIS — I495 Sick sinus syndrome: Secondary | ICD-10-CM

## 2023-04-01 LAB — CUP PACEART REMOTE DEVICE CHECK
Battery Impedance: 2056 Ohm
Battery Remaining Longevity: 42 mo
Battery Voltage: 2.74 V
Brady Statistic AP VP Percent: 0 %
Brady Statistic AP VS Percent: 21 %
Brady Statistic AS VP Percent: 0 %
Brady Statistic AS VS Percent: 79 %
Date Time Interrogation Session: 20240405065509
Implantable Lead Connection Status: 753985
Implantable Lead Connection Status: 753985
Implantable Lead Implant Date: 20130122
Implantable Lead Implant Date: 20130122
Implantable Lead Location: 753859
Implantable Lead Location: 753860
Implantable Lead Model: 4092
Implantable Lead Model: 5076
Implantable Pulse Generator Implant Date: 20130122
Lead Channel Impedance Value: 363 Ohm
Lead Channel Impedance Value: 729 Ohm
Lead Channel Pacing Threshold Amplitude: 0.5 V
Lead Channel Pacing Threshold Amplitude: 0.625 V
Lead Channel Pacing Threshold Pulse Width: 0.4 ms
Lead Channel Pacing Threshold Pulse Width: 0.4 ms
Lead Channel Setting Pacing Amplitude: 1.5 V
Lead Channel Setting Pacing Amplitude: 2 V
Lead Channel Setting Pacing Pulse Width: 0.4 ms
Lead Channel Setting Sensing Sensitivity: 4 mV
Zone Setting Status: 755011
Zone Setting Status: 755011

## 2023-04-21 ENCOUNTER — Encounter: Payer: Self-pay | Admitting: Internal Medicine

## 2023-04-21 ENCOUNTER — Ambulatory Visit: Payer: Medicare Other | Attending: Internal Medicine | Admitting: Internal Medicine

## 2023-04-21 VITALS — Ht 62.0 in | Wt 157.0 lb

## 2023-04-21 DIAGNOSIS — Z950 Presence of cardiac pacemaker: Secondary | ICD-10-CM | POA: Diagnosis present

## 2023-04-21 DIAGNOSIS — I495 Sick sinus syndrome: Secondary | ICD-10-CM

## 2023-04-21 DIAGNOSIS — I951 Orthostatic hypotension: Secondary | ICD-10-CM | POA: Diagnosis present

## 2023-04-21 LAB — PACEMAKER DEVICE OBSERVATION

## 2023-04-21 MED ORDER — FUROSEMIDE 20 MG PO TABS: ORAL_TABLET | ORAL | 0 refills | Status: DC

## 2023-04-21 NOTE — Progress Notes (Signed)
Patient Care Team: Eden Emms, NP as PCP - General (Nurse Practitioner) Debbe Odea, MD as PCP - Cardiology (Cardiology) Levert Feinstein, MD as Consulting Physician (Neurology) Debbe Odea, MD as Consulting Physician (Cardiology) Jerilee Field, MD as Consulting Physician (Urology) Archer Asa, MD as Consulting Physician (Psychiatry)   HPI  Regina Hernandez is a 89 y.o. female seen in followup for pacemaker Medtronic implanted elsewhere remotely for tachybradycardia syndrome . Gen change 2012 Cx by sepsis requiring extraction and reimplantation 9/12   The patient denies chest pain, nocturnal dyspnea, orthopnea.  There have been no palpitations or syncope.  Complains of dyspnea on exertion.  Also peripheral edema and interval weight gain.  Lightheadedness has not been too much of a problem, no falls.    Interval hospitalization 9/23 for recurrent GI bleeding, anticoagulation was discontinued     DATE TEST EF    9/17 MYOVIEW    % No ischemia  10/20 Echo  55-65%    3/21 Echo 55-60%    3/22 Echo  60-65%   4/23 Echo  60-65% AoSclerosis     Date Cr K Hgb  2/21 1.0 4.9 12.8   7/21  0.7 3.2<<2.8    2/22   12.7  12/22 0.92 4.8 13.9  3/24 0.85 3.7 12.2      Records and Results Reviewed   Past Medical History:  Diagnosis Date   Anxiety    Cervical dystonia    Essential tremor    GI bleed    Memory loss    Migraine    Occipital neuralgia     Past Surgical History:  Procedure Laterality Date   BOWEL RESECTION     CARDIAC CATHETERIZATION  2016   no stents/[facility]   ESOPHAGOGASTRODUODENOSCOPY N/A 06/18/2020   Procedure: ESOPHAGOGASTRODUODENOSCOPY (EGD);  Surgeon: Toledo, Boykin Nearing, MD;  Location: [facility] ENDOSCOPY;  Service: Gastroenterology;  Laterality: N/A;   ESOPHAGOGASTRODUODENOSCOPY N/A 08/29/2022   Procedure: ESOPHAGOGASTRODUODENOSCOPY (EGD);  Surgeon: Jenel Lucks, MD;  Location[facility] END[facility]COPY;  Service:  Gastroenterology;  Laterality: N/A;   ESOPHAGOGASTRODUODENOSCOPY N/A 08/27/2022   Procedure: ESOPHAGOGASTRODUODENOSCOPY (EGD);  Surgeon: Toledo, Boykin Nearing, MD;  Location: [facility] ENDOSCOPY;  Service: Gastroenterology;  Laterality: N/A;   ESOPHAGOGASTRODUODENOSCOPY (EGD) WITH PROPOFOL N/A 08/28/2022   Procedure: ESOPHAGOGASTRODUODENOSCOPY (EGD) WITH PROPOFOL;  Surgeon: Toledo, Boykin Nearing, MD;  Location: [facility] ENDOSCOPY;  Service: Gastroenterology;  Laterality: N/A;   IR ANGIOGRAM VISCERAL SELECTIVE  08/29/2022   IR ANGIOGRAM VISCERAL SELECTIVE  08/29/2022   IR ANGIOGRAM VISCERAL SELECTIVE  08/29/2022   IR AORTAGRAM ABDOMINAL SERIALOGRAM  08/29/2022   IR EMBO ART  VEN HEMORR LYMPH EXTRAV  INC GUIDE ROADMAPPING  08/29/2022   IR US GUIDE VASC ACCESS RIGHT  08/29/2022   PACEMAKER IMPLANT     REPLACEMENT TOTAL KNEE Left     Current Meds  Medication Sig   acetaminophen (TYLENOL) 500 MG tablet Take 1,000 mg by mouth 2 (two) times daily.   ARIPiprazole (ABILIFY) 5 MG tablet Take 5 mg by mouth every morning.   atorvastatin (LIPITOR) 10 MG tablet Take 1 tablet (10 mg total) by mouth daily.   busPIRone (BUSPAR) 10 MG tablet Take 10 mg by mouth 2 (two) times daily.   cholecalciferol (VITAMIN D3) 25 MCG (1000 UNIT) tablet Take 1,000 Units by mouth daily.   docusate sodium (COLACE) 100 MG capsule Take 100 mg by mouth daily as needed for mild constipation.   gabapentin (NEURONTIN) 400 MG capsule Take 1 capsule (400 mg  total) by mouth 2 (two) times daily.   LORazepam (ATIVAN) 0.5 MG tablet Take 0.5 mg by mouth 2 (two) times daily as needed.   memantine (NAMENDA) 10 MG tablet TAKE 1 TABLET BY MOUTH TWICE A DAY   metoprolol tartrate (LOPRESSOR) 25 MG tablet TAKE 0.5 TABLETS BY MOUTH 2 TIMES DAILY.   nitrofurantoin (MACRODANTIN) 100 MG capsule Take 100 mg by mouth daily.   pantoprazole (PROTONIX) 40 MG tablet Take 1 tablet (40 mg total) by mouth daily.   primidone (MYSOLINE) 50 MG tablet Take 100 mg by mouth daily.    venlafaxine XR (EFFEXOR XR) 150 MG 24 hr capsule Take 150 mg by mouth daily with breakfast.   vitamin B-12 (CYANOCOBALAMIN) 500 MCG tablet Take 500 mcg by mouth daily.    Allergies  Allergen Reactions   Adhesive [Tape] Other (See Comments)    Contact dermatitis   Baclofen Rash   Lyrica [Pregabalin] Other (See Comments)    Unable to remember reaction.   Sulfa Antibiotics Rash      Review of Systems negative except from HPI and PMH  Physical Exam Ht  (1.575 m)   Wt 157 lb (71.2 kg)   SpO2 97%   BMI 28.72 kg/m  Well developed and well nourished in no acute distress HENT normal Neck supple with JVP-7-8 cm positive HJR Clear Device pocket well healed; without hematoma or erythema.  There is no tethering  Regular rate and rhythm, diminished S1 widely split S2 2/6 murmur Abd-soft with active BS No Clubbing cyanosis trace edema Skin-warm and dry A & Oriented  Grossly normal sensory and motor function  ECG atrial pacing at 77 Intervals 31/08/40 Axis leftward -41 Inferior wall MI Anterior wall MI  Device function is normal. Programming changes rate response was inactivated See Paceart for details    CrCl cannot be calculated (Patient's most recent lab result is older than the maximum 21 days allowed.).   Assessment and  Plan  Sinus node dysfunction-intermittent   Pacemaker-Medtronic (DOI 9/12)  Dyspnea on exertion  Hypertension  Atrial fibrillation-paroxysmal  GI bleeding-massive precluding anticoagulation  Orthostatic intolerance  Blood pressure is modestly elevated; however, there is a 35 mm drop with standing and as such, we will continue her on her current regime with very low-dose metoprolol at 12.5 mg twice daily to try to address intermittent SVT  Heart rate excursion seems adequate indeed there are 16% of her heartbeats faster than 100 bpm.  Some of these are paced.  And we will inactivate rate response.  She is mildly volume overloaded, suspect  some degree of HFpEF and will try low-dose diuretic therapy furosemide 20 mg q. OD for 2 weeks.  She may be a candidate for an SGLT2 we will defer this decision for now.  Per her device she has had no intermittent atrial fibrillation

## 2023-04-21 NOTE — Patient Instructions (Addendum)
Medication Instructions:  Your physician has recommended you make the following change in your medication:   ** Take Furosemide -1 tablet by mouth every other day x 2 weeks then stop.  *If you need a refill on your cardiac medications before your next appointment, please call your pharmacy*   Lab Work: None ordered.  If you have labs (blood work) drawn today and your tests are completely normal, you will receive your results only by: MyChart Message (if you have MyChart) OR A paper copy in the mail If you have any lab test that is abnormal or we need to change your treatment, we will call you to review the results.   Testing/Procedures: None ordered.    Follow-Up: At [facility], you and your health needs are our priority.  As part of our continuing mission to provide you with exceptional heart care, we have created designated Provider Care Teams.  These Care Teams include your primary Cardiologist (physician) and Advanced Practice Providers (APPs -  Physician Assistants and Nurse Practitioners) who all work together to provide you with the care you need, when you need it.  We recommend signing up for the patient portal called "MyChart".  Sign up information is provided on this After Visit Summary.  MyChart is used to connect with patients for Virtual Visits (Telemedicine).  Patients are able to view lab/test results, encounter notes, upcoming appointments, etc.  Non-urgent messages can be sent to your provider as well.   To learn more about what you can do with MyChart, go to [email or web address].    Your next appointment:   12 months with Dr Graciela Husbands  Please send Regina Hernandez a MyChart message after 2 weeks to let Regina Hernandez know if the Furosemide helped you to feel better.

## 2023-05-01 ENCOUNTER — Other Ambulatory Visit: Payer: Self-pay | Admitting: Nurse Practitioner

## 2023-05-03 ENCOUNTER — Encounter: Payer: Self-pay | Admitting: Internal Medicine

## 2023-05-03 DIAGNOSIS — Z79899 Other long term (current) drug therapy: Secondary | ICD-10-CM

## 2023-05-03 DIAGNOSIS — I472 Ventricular tachycardia, unspecified: Secondary | ICD-10-CM

## 2023-05-03 DIAGNOSIS — R55 Syncope and collapse: Secondary | ICD-10-CM

## 2023-05-03 DIAGNOSIS — I10 Essential (primary) hypertension: Secondary | ICD-10-CM

## 2023-05-03 DIAGNOSIS — R0602 Shortness of breath: Secondary | ICD-10-CM

## 2023-05-03 NOTE — Progress Notes (Signed)
Remote pacemaker transmission.   

## 2023-05-04 ENCOUNTER — Other Ambulatory Visit: Payer: Self-pay | Admitting: Nurse Practitioner

## 2023-05-09 MED ORDER — FUROSEMIDE 20 MG PO TABS: ORAL_TABLET | ORAL | 3 refills | Status: DC

## 2023-05-10 ENCOUNTER — Encounter: Payer: Self-pay | Admitting: Internal Medicine

## 2023-05-12 ENCOUNTER — Other Ambulatory Visit
Admission: RE | Admit: 2023-05-12 | Discharge: 2023-05-12 | Disposition: A | Discharge status: Home / Self Care | Payer: Medicare Other | Attending: Internal Medicine | Admitting: Internal Medicine

## 2023-05-12 DIAGNOSIS — I10 Essential (primary) hypertension: Secondary | ICD-10-CM | POA: Insufficient documentation

## 2023-05-12 DIAGNOSIS — R55 Syncope and collapse: Secondary | ICD-10-CM | POA: Insufficient documentation

## 2023-05-12 DIAGNOSIS — I472 Ventricular tachycardia, unspecified: Secondary | ICD-10-CM | POA: Diagnosis present

## 2023-05-12 DIAGNOSIS — Z79899 Other long term (current) drug therapy: Secondary | ICD-10-CM

## 2023-05-12 DIAGNOSIS — R0602 Shortness of breath: Secondary | ICD-10-CM | POA: Insufficient documentation

## 2023-05-12 LAB — BASIC METABOLIC PANEL
Anion gap: 7 (ref 5–15)
BUN: 22 mg/dL (ref 8–23)
CO2: 26 mmol/L (ref 22–32)
Calcium: 9.9 mg/dL (ref 8.9–10.3)
Chloride: 102 mmol/L (ref 98–111)
Creatinine, Ser: 0.78 mg/dL (ref 0.44–1.00)
GFR, Estimated: >60 mL/min (ref >60)
Glucose, Bld: 86 mg/dL (ref 70–99)
Potassium: 4.7 mmol/L (ref 3.5–5.1)
Sodium: 135 mmol/L (ref 135–145)

## 2023-05-17 ENCOUNTER — Encounter: Payer: Medicare Other | Admitting: Internal Medicine

## 2023-05-17 NOTE — Progress Notes (Signed)
Altered mental status, possible intoxication

## 2023-06-10 ENCOUNTER — Encounter: Payer: Self-pay | Admitting: Nurse Practitioner

## 2023-06-12 NOTE — Progress Notes (Unsigned)
    Johanan Skorupski T. Jonalyn Sedlak, MD, CAQ Sports Medicine [facility] at [facility] [address] [place], [address]  Phone: [phone]  FAX: [phone]  Regina Hernandez - 89 y.o. female  MRN [id number]  Date of Birth: 06/20/1934  Date: 06/13/2023  PCP: Eden Emms, NP  Referral: Eden Emms, NP  No chief complaint on file.  Subjective:   Regina Hernandez is a 89 y.o. very pleasant female patient with There is no height or weight on file to calculate BMI. who presents with the following:  She does have a history of right-sided knee osteoarthritis, and I did inject her knee roughly 4 to 5 months ago.  She has a history of left-sided total knee arthroplasty distantly.  She is here for procedure only.  Aspiration/Injection Procedure Note Regina Hernandez 07/12/1934 Date of procedure: 06/13/2023  Procedure: Large Joint Aspiration / Injection of Knee, R Indications: Pain  Procedure Details Patient verbally consented to procedure. Risks, benefits, and alternatives explained. Sterilely prepped with Chloraprep. Ethyl cholride used for anesthesia. 9 cc Lidocaine 1% mixed with 1 mL of Kenalog 40 mg injected using the anteromedial approach without difficulty. No complications with procedure and tolerated well. Patient had decreased pain post-injection. Medication: 1 mL of Kenalog 40 mg     ICD-10-CM   1. Primary osteoarthritis of right knee  M17.11       Medication Management during today's office visit: No orders of the defined types were placed in this encounter.  Orders placed today for conditions managed today: No orders of the defined types were placed in this encounter.

## 2023-06-13 ENCOUNTER — Encounter: Payer: Self-pay | Admitting: Family Medicine

## 2023-06-13 ENCOUNTER — Ambulatory Visit (INDEPENDENT_AMBULATORY_CARE_PROVIDER_SITE_OTHER): Payer: Medicare Other | Admitting: Family Medicine

## 2023-06-13 VITALS — BP 112/76 | HR 61 | Temp 97.9°F | Ht 63.0 in | Wt 160.4 lb

## 2023-06-13 DIAGNOSIS — M1711 Unilateral primary osteoarthritis, right knee: Secondary | ICD-10-CM

## 2023-06-13 MED ORDER — TRIAMCINOLONE ACETONIDE 40 MG/ML IJ SUSP
40.0000 mg | Freq: Once | INTRAMUSCULAR | Status: AC
Start: 2023-06-13 — End: 2023-06-13
  Administered 2023-06-13: 40 mg via INTRA_ARTICULAR

## 2023-06-13 NOTE — Addendum Note (Signed)
Addended by: Damita Lack on: 06/13/2023 10:30 AM   Modules accepted: Orders

## 2023-07-01 ENCOUNTER — Ambulatory Visit (INDEPENDENT_AMBULATORY_CARE_PROVIDER_SITE_OTHER): Payer: Medicare Other

## 2023-07-01 DIAGNOSIS — I495 Sick sinus syndrome: Secondary | ICD-10-CM

## 2023-07-01 LAB — CUP PACEART REMOTE DEVICE CHECK
Battery Impedance: 2113 Ohm
Battery Remaining Longevity: 40 mo
Battery Voltage: 2.73 V
Brady Statistic AP VP Percent: 0 %
Brady Statistic AP VS Percent: 39 %
Brady Statistic AS VP Percent: 0 %
Brady Statistic AS VS Percent: 60 %
Date Time Interrogation Session: 20240705065843
Implantable Lead Connection Status: 753985
Implantable Lead Connection Status: 753985
Implantable Lead Implant Date: 20130122
Implantable Lead Implant Date: 20130122
Implantable Lead Location: 753859
Implantable Lead Location: 753860
Implantable Lead Model: 4092
Implantable Lead Model: 5076
Implantable Pulse Generator Implant Date: 20130122
Lead Channel Impedance Value: 367 Ohm
Lead Channel Impedance Value: 680 Ohm
Lead Channel Pacing Threshold Amplitude: 0.5 V
Lead Channel Pacing Threshold Amplitude: 0.625 V
Lead Channel Pacing Threshold Pulse Width: 0.4 ms
Lead Channel Pacing Threshold Pulse Width: 0.4 ms
Lead Channel Setting Pacing Amplitude: 1.5 V
Lead Channel Setting Pacing Amplitude: 2 V
Lead Channel Setting Pacing Pulse Width: 0.4 ms
Lead Channel Setting Sensing Sensitivity: 5.6 mV
Zone Setting Status: 755011
Zone Setting Status: 755011

## 2023-07-19 NOTE — Progress Notes (Signed)
Remote pacemaker transmission.   

## 2023-07-27 ENCOUNTER — Encounter (INDEPENDENT_AMBULATORY_CARE_PROVIDER_SITE_OTHER): Payer: Self-pay

## 2023-08-09 ENCOUNTER — Encounter: Payer: Self-pay | Admitting: Nurse Practitioner

## 2023-08-09 ENCOUNTER — Ambulatory Visit (INDEPENDENT_AMBULATORY_CARE_PROVIDER_SITE_OTHER): Payer: Medicare Other | Admitting: Nurse Practitioner

## 2023-08-09 VITALS — BP 110/76 | HR 81 | Temp 97.8°F | Ht 63.0 in | Wt 161.8 lb

## 2023-08-09 DIAGNOSIS — I495 Sick sinus syndrome: Secondary | ICD-10-CM

## 2023-08-09 DIAGNOSIS — K264 Chronic or unspecified duodenal ulcer with hemorrhage: Secondary | ICD-10-CM | POA: Diagnosis not present

## 2023-08-09 DIAGNOSIS — I10 Essential (primary) hypertension: Secondary | ICD-10-CM | POA: Diagnosis not present

## 2023-08-09 DIAGNOSIS — G250 Essential tremor: Secondary | ICD-10-CM | POA: Diagnosis not present

## 2023-08-09 DIAGNOSIS — I2510 Atherosclerotic heart disease of native coronary artery without angina pectoris: Secondary | ICD-10-CM

## 2023-08-09 LAB — CBC
HCT: 41.4 % (ref 36.0–46.0)
Hemoglobin: 13.3 g/dL (ref 12.0–15.0)
MCHC: 32.2 g/dL (ref 30.0–36.0)
MCV: 90.9 fl (ref 78.0–100.0)
Platelets: 177 10*3/uL (ref 150.0–400.0)
RBC: 4.56 Mil/uL (ref 3.87–5.11)
RDW: 15.1 % (ref 11.5–15.5)
WBC: 9.3 10*3/uL (ref 4.0–10.5)

## 2023-08-09 LAB — LIPID PANEL
Cholesterol: 170 mg/dL (ref 0–200)
HDL: 76.2 mg/dL (ref >39.00)
LDL Cholesterol: 67 mg/dL (ref 0–99)
NonHDL: 93.98
Total CHOL/HDL Ratio: 2
Triglycerides: 135 mg/dL (ref 0.0–149.0)
VLDL: 27 mg/dL (ref 0.0–40.0)

## 2023-08-09 LAB — COMPREHENSIVE METABOLIC PANEL
ALT: 11 U/L (ref 0–35)
AST: 17 U/L (ref 0–37)
Albumin: 4.2 g/dL (ref 3.5–5.2)
Alkaline Phosphatase: 57 U/L (ref 39–117)
BUN: 25 mg/dL — ABNORMAL HIGH (ref 6–23)
CO2: 31 mEq/L (ref 19–32)
Calcium: 10.6 mg/dL — ABNORMAL HIGH (ref 8.4–10.5)
Chloride: 96 mEq/L (ref 96–112)
Creatinine, Ser: 0.9 mg/dL (ref 0.40–1.20)
GFR: 57.53 mL/min — ABNORMAL LOW (ref >60.00)
Glucose, Bld: 105 mg/dL — ABNORMAL HIGH (ref 70–99)
Potassium: 4.9 mEq/L (ref 3.5–5.1)
Sodium: 131 mEq/L — ABNORMAL LOW (ref 135–145)
Total Bilirubin: 0.3 mg/dL (ref 0.2–1.2)
Total Protein: 7.3 g/dL (ref 6.0–8.3)

## 2023-08-09 NOTE — Progress Notes (Signed)
Established Patient Office Visit  Subjective   Patient ID: Regina Hernandez, female    DOB: 18-Jul-1934  Age: 89 y.o. MRN: [id number]  Chief Complaint  Patient presents with   recheck chronic conditions    Pt states she is doing pretty good.     HPI  HTN: states that she is not having any lightheadedness or dizziness. States that her left went out and hit the ground. States that she did not get seen. No loc or head injury per patient or daughters report    GI bleed: states that followed by Dr Norma Fredrickson and is on protonix.  Patient states they have yearly follow-ups  RIght knee pain: states that she was seen by Dr. Patsy Lager and got an injectoin in her knee that helped for a month.  Tremor: has been seen by neurology in the past and the pysch is currently managing her primidone.  Patient is also on antipsychotic and Abilify  BH: every 3-4 months and next appointment in October.  States that she is sleeping well.  Patient currently maintained on Abilify, buspirone, lorazepam, venlafaxine.    Review of Systems  Constitutional:  Negative for chills and fever.  Respiratory:  Positive for shortness of breath.   Cardiovascular:  Negative for chest pain.  Neurological:  Negative for dizziness and headaches.      Objective:     BP 110/76   Pulse 81   Temp 97.8 F (36.6 C) (Temporal)   Ht 5\' 3"  (1.6 m)   Wt 161 lb 12.8 oz (73.4 kg)   SpO2 96%   BMI 28.66 kg/m    Physical Exam Vitals and nursing note reviewed.  Constitutional:      Appearance: Normal appearance.  Cardiovascular:     Rate and Rhythm: Normal rate and regular rhythm.     Heart sounds: Normal heart sounds.  Pulmonary:     Effort: Pulmonary effort is normal.     Breath sounds: Normal breath sounds.  Musculoskeletal:     Right lower leg: Edema (non pitting) present.     Left lower leg: Edema (non pitting) present.  Neurological:     Mental Status: She is alert.      No results found for any visits on  08/09/23.    The ASCVD Risk score (Arnett DK, et al., 2019) failed to calculate for the following reasons:   The 2019 ASCVD risk score is only valid for ages 43 to 1    Assessment & Plan:   Problem List Items Addressed This Visit       Cardiovascular and Mediastinum   Essential hypertension - Primary (Chronic)    Patient currently maintained on metoprolol and furosemide.  Patient is tolerating medications well blood pressure under good control she is followed by cardiology continue medication as prescribed      Relevant Orders   CBC   Comprehensive metabolic panel   Coronary atherosclerosis    Patient currently on atorvastatin pending lipid panel today      Relevant Orders   Lipid panel   Sick sinus syndrome [facility])     Digestive   Duodenal ulcer with hemorrhage    Patient is followed by gastroenterology Dr. Norma Fredrickson.  Patient on Protonix 40 mg daily continue following with specialist and taking medication as prescribed.        Nervous and Auditory   Essential tremor    Patient was followed by neurology but since her psychiatrist has been writing her primidone.  Patient is  also on Abilify continue medication as prescribed follow-up with psychiatry as recommended       Return in about 6 months (around 02/09/2024) for HTN, CKD, Memory .    Audria Nine, NP

## 2023-08-09 NOTE — Patient Instructions (Signed)
Nice to see you today I will be in touch with the labs once I have reviewed them Follow up with me in 6 months, sooner If you need me

## 2023-08-09 NOTE — Assessment & Plan Note (Signed)
Patient is followed by gastroenterology Dr. Norma Fredrickson.  Patient on Protonix 40 mg daily continue following with specialist and taking medication as prescribed.

## 2023-08-09 NOTE — Assessment & Plan Note (Signed)
Patient currently maintained on metoprolol and furosemide.  Patient is tolerating medications well blood pressure under good control she is followed by cardiology continue medication as prescribed

## 2023-08-09 NOTE — Assessment & Plan Note (Signed)
Patient currently on atorvastatin pending lipid panel today

## 2023-08-09 NOTE — Assessment & Plan Note (Signed)
Patient was followed by neurology but since her psychiatrist has been writing her primidone.  Patient is also on Abilify continue medication as prescribed follow-up with psychiatry as recommended

## 2023-08-10 ENCOUNTER — Other Ambulatory Visit: Payer: Self-pay | Admitting: Nurse Practitioner

## 2023-08-10 DIAGNOSIS — E871 Hypo-osmolality and hyponatremia: Secondary | ICD-10-CM

## 2023-08-14 ENCOUNTER — Other Ambulatory Visit: Payer: Self-pay | Admitting: Nurse Practitioner

## 2023-08-15 ENCOUNTER — Encounter: Payer: Self-pay | Admitting: Urology

## 2023-08-15 ENCOUNTER — Ambulatory Visit (INDEPENDENT_AMBULATORY_CARE_PROVIDER_SITE_OTHER): Payer: Medicare Other | Admitting: Urology

## 2023-08-15 VITALS — BP 134/92 | HR 82 | Ht 63.0 in | Wt 161.0 lb

## 2023-08-15 DIAGNOSIS — N3946 Mixed incontinence: Secondary | ICD-10-CM

## 2023-08-15 DIAGNOSIS — Z87440 Personal history of urinary (tract) infections: Secondary | ICD-10-CM | POA: Diagnosis not present

## 2023-08-15 DIAGNOSIS — N3020 Other chronic cystitis without hematuria: Secondary | ICD-10-CM

## 2023-08-15 LAB — URINALYSIS, COMPLETE
Bilirubin, UA: NEGATIVE
Glucose, UA: NEGATIVE
Ketones, UA: NEGATIVE
Leukocytes,UA: NEGATIVE
Nitrite, UA: NEGATIVE
Protein,UA: NEGATIVE
Specific Gravity, UA: 1.015 (ref 1.005–1.030)
Urobilinogen, Ur: 0.2 mg/dL (ref 0.2–1.0)
pH, UA: 6 (ref 5.0–7.5)

## 2023-08-15 LAB — MICROSCOPIC EXAMINATION

## 2023-08-15 MED ORDER — NITROFURANTOIN MACROCRYSTAL 100 MG PO CAPS: 100.0000 mg | ORAL_CAPSULE | Freq: Every day | ORAL | 3 refills | Status: DC

## 2023-08-15 NOTE — Progress Notes (Signed)
08/15/2023 9:43 AM   Regina Hernandez [birth year] [id number]  Referring provider: Gweneth Dimitri, MD [address] [place],  [place] [address]  Chief Complaint  Patient presents with   Follow-up    1 year follow-up    HPI: Reviewed chart.  Patient with recurrent urinary tract infections.  1 year ago infection free on Macrodantin.  Clinically not infected.  Frequency stable.  Very pleased.   PMH: Past Medical History:  Diagnosis Date   Anxiety    Cervical dystonia    Essential tremor    GI bleed    Memory loss    Migraine    Occipital neuralgia     Surgical History: Past Surgical History:  Procedure Laterality Date   BOWEL RESECTION     CARDIAC CATHETERIZATION  2016   no stents/[facility]   ESOPHAGOGASTRODUODENOSCOPY N/A 06/18/2020   Procedure: ESOPHAGOGASTRODUODENOSCOPY (EGD);  Surgeon: Toledo, Boykin Nearing, MD;  Location: [facility] ENDOSCOPY;  Service: Gastroenterology;  Laterality: N/A;   ESOPHAGOGASTRODUODENOSCOPY N/A 08/29/2022   Procedure: ESOPHAGOGASTRODUODENOSCOPY (EGD);  Surgeon: Jenel Lucks, MD;  Location: [facility] ENDOSCOPY;  Service: Gastroenterology;  Laterality: N/A;   ESOPHAGOGASTRODUODENOSCOPY N/A 08/27/2022   Procedure: ESOPHAGOGASTRODUODENOSCOPY (EGD);  Surgeon: Toledo, Boykin Nearing, MD;  Location: [facility] ENDOSCOPY;  Service: Gastroenterology;  Laterality: N/A;   ESOPHAGOGASTRODUODENOSCOPY (EGD) WITH PROPOFOL N/A 08/28/2022   Procedure: ESOPHAGOGASTRODUODENOSCOPY (EGD) WITH PROPOFOL;  Surgeon: Toledo, Boykin Nearing, MD;  Location: [facility] ENDOSCOPY;  Service: Gastroenterology;  Laterality: N/A;   IR ANGIOGRAM VISCERAL SELECTIVE  08/29/2022   IR ANGIOGRAM VISCERAL SELECTIVE  08/29/2022   IR ANGIOGRAM VISCERAL SELECTIVE  08/29/2022   IR AORTAGRAM ABDOMINAL SERIALOGRAM  08/29/2022   IR EMBO ART  VEN HEMORR LYMPH EXTRAV  INC GUIDE ROADMAPPING  08/29/2022   IR US GUIDE VASC ACCESS RIGHT  08/29/2022   PACEMAKER IMPLANT     REPLACEMENT TOTAL KNEE Left     Home  Medications:  Allergies as of 08/15/2023       Reactions   Adhesive [tape] Other (See Comments)   Contact dermatitis   Baclofen Rash   Lyrica [pregabalin] Other (See Comments)   Unable to remember reaction.   Sulfa Antibiotics Rash        Medication List        Accurate as of August 15, 2023  9:43 AM. If you have any questions, ask your nurse or doctor.          acetaminophen 500 MG tablet Commonly known as: TYLENOL Take 1,000 mg by mouth 2 (two) times daily.   ARIPiprazole 5 MG tablet Commonly known as: ABILIFY Take 5 mg by mouth every morning.   atorvastatin 10 MG tablet Commonly known as: LIPITOR TAKE 1 TABLET BY MOUTH EVERY DAY   busPIRone 10 MG tablet Commonly known as: BUSPAR Take 10 mg by mouth 2 (two) times daily.   cholecalciferol 25 MCG (1000 UNIT) tablet Commonly known as: VITAMIN D3 Take 1,000 Units by mouth daily.   docusate sodium 100 MG capsule Commonly known as: COLACE Take 100 mg by mouth daily as needed for mild constipation.   Effexor XR 150 MG 24 hr capsule Generic drug: venlafaxine XR Take 150 mg by mouth daily with breakfast.   furosemide 20 MG tablet Commonly known as: LASIX Take 1 tablet by mouth 3 times per week.   gabapentin 400 MG capsule Commonly known as: NEURONTIN TAKE 1 CAPSULE BY MOUTH 2 TIMES DAILY.   LORazepam 0.5 MG tablet Commonly known as: ATIVAN Take 0.5  mg by mouth 2 (two) times daily as needed.   memantine 10 MG tablet Commonly known as: NAMENDA TAKE 1 TABLET BY MOUTH TWICE A DAY   metoprolol tartrate 25 MG tablet Commonly known as: LOPRESSOR TAKE 0.5 TABLETS BY MOUTH 2 TIMES DAILY.   nitrofurantoin 100 MG capsule Commonly known as: MACRODANTIN Take 100 mg by mouth daily.   pantoprazole 40 MG tablet Commonly known as: PROTONIX Take 1 tablet (40 mg total) by mouth daily.   primidone 50 MG tablet Commonly known as: MYSOLINE Take 100 mg by mouth daily.   vitamin B-12 500 MCG tablet Commonly known  as: CYANOCOBALAMIN Take 500 mcg by mouth daily.        Allergies:  Allergies  Allergen Reactions   Adhesive [Tape] Other (See Comments)    Contact dermatitis   Baclofen Rash   Lyrica [Pregabalin] Other (See Comments)    Unable to remember reaction.   Sulfa Antibiotics Rash    Family History: Family History  Problem Relation Age of Onset   Other Mother        "old age"   Ulcers Father    Stomach cancer Brother     Social History:  reports that she has never smoked. She has never been exposed to tobacco smoke. She has never used smokeless tobacco. She reports that she does not currently use alcohol. She reports that she does not use drugs.  ROS:                                        Physical Exam: BP (!) 134/92   Pulse 82   Ht 5\' 3"  (1.6 m)   Wt 73 kg   BMI 28.52 kg/m   Constitutional:  Alert and oriented, No acute distress. HEENT: [place] AT, moist mucus membranes.  Trachea midline, no masses.   Laboratory Data: Lab Results  Component Value Date   WBC 9.3 08/09/2023   HGB 13.3 08/09/2023   HCT 41.4 08/09/2023   MCV 90.9 08/09/2023   PLT 177.0 08/09/2023    Lab Results  Component Value Date   CREATININE 0.90 08/09/2023    No results found for: "PSA"  No results found for: "TESTOSTERONE"  No results found for: "HGBA1C"  Urinalysis    Component Value Date/Time   COLORURINE YELLOW (A) 06/02/2022 1921   APPEARANCEUR Clear 08/16/2022 0947   LABSPEC 1.013 06/02/2022 1921   PHURINE 6.0 06/02/2022 1921   GLUCOSEU Negative 08/16/2022 0947   HGBUR NEGATIVE 06/02/2022 1921   BILIRUBINUR Negative 08/16/2022 0947   KETONESUR NEGATIVE 06/02/2022 1921   PROTEINUR Negative 08/16/2022 0947   PROTEINUR NEGATIVE 06/02/2022 1921   NITRITE Negative 08/16/2022 0947   NITRITE NEGATIVE 06/02/2022 1921   LEUKOCYTESUR Negative 08/16/2022 0947   LEUKOCYTESUR NEGATIVE 06/02/2022 1921    Pertinent Imaging:   Assessment & Plan: Macrodantin  renewed and I will see in a year.  1. Mixed incontinence  - Urinalysis, Complete   No follow-ups on file.  Martina Sinner, MD  [facility] [address], [address] [place], [place] [address] [phone]

## 2023-08-28 ENCOUNTER — Other Ambulatory Visit: Payer: Self-pay | Admitting: Nurse Practitioner

## 2023-08-30 ENCOUNTER — Other Ambulatory Visit (INDEPENDENT_AMBULATORY_CARE_PROVIDER_SITE_OTHER): Payer: Medicare Other

## 2023-08-30 DIAGNOSIS — E871 Hypo-osmolality and hyponatremia: Secondary | ICD-10-CM | POA: Diagnosis not present

## 2023-08-30 LAB — BASIC METABOLIC PANEL
BUN: 18 mg/dL (ref 6–23)
CO2: 30 meq/L (ref 19–32)
Calcium: 10 mg/dL (ref 8.4–10.5)
Chloride: 98 meq/L (ref 96–112)
Creatinine, Ser: 0.86 mg/dL (ref 0.40–1.20)
GFR: 60.73 mL/min (ref >60.00)
Glucose, Bld: 77 mg/dL (ref 70–99)
Potassium: 4.9 meq/L (ref 3.5–5.1)
Sodium: 136 meq/L (ref 135–145)

## 2023-08-31 LAB — PTH, INTACT AND CALCIUM
Calcium: 10 mg/dL (ref 8.6–10.4)
PTH: 100 pg/mL — ABNORMAL HIGH (ref 16–77)

## 2023-09-01 ENCOUNTER — Ambulatory Visit (INDEPENDENT_AMBULATORY_CARE_PROVIDER_SITE_OTHER): Payer: Medicare Other

## 2023-09-01 LAB — VITAMIN D 25 HYDROXY (VIT D DEFICIENCY, FRACTURES): VITD: 47.31 ng/mL (ref 30.00–100.00)

## 2023-09-01 NOTE — Addendum Note (Signed)
Addended by: Vincenza Hews on: 09/01/2023 08:28 AM   Modules accepted: Orders

## 2023-09-02 ENCOUNTER — Encounter: Payer: Self-pay | Admitting: Nurse Practitioner

## 2023-09-09 ENCOUNTER — Other Ambulatory Visit: Payer: Self-pay | Admitting: Internal Medicine

## 2023-09-30 ENCOUNTER — Ambulatory Visit (INDEPENDENT_AMBULATORY_CARE_PROVIDER_SITE_OTHER): Payer: Medicare Other

## 2023-09-30 DIAGNOSIS — I495 Sick sinus syndrome: Secondary | ICD-10-CM

## 2023-09-30 LAB — CUP PACEART REMOTE DEVICE CHECK
Battery Impedance: 2388 Ohm
Battery Remaining Longevity: 36 mo
Battery Voltage: 2.73 V
Brady Statistic AP VP Percent: 0 %
Brady Statistic AP VS Percent: 37 %
Brady Statistic AS VP Percent: 0 %
Brady Statistic AS VS Percent: 62 %
Date Time Interrogation Session: 20241004065514
Implantable Lead Connection Status: 753985
Implantable Lead Connection Status: 753985
Implantable Lead Implant Date: 20130122
Implantable Lead Implant Date: 20130122
Implantable Lead Location: 753859
Implantable Lead Location: 753860
Implantable Lead Model: 4092
Implantable Lead Model: 5076
Implantable Pulse Generator Implant Date: 20130122
Lead Channel Impedance Value: 372 Ohm
Lead Channel Impedance Value: 719 Ohm
Lead Channel Pacing Threshold Amplitude: 0.5 V
Lead Channel Pacing Threshold Amplitude: 0.625 V
Lead Channel Pacing Threshold Pulse Width: 0.4 ms
Lead Channel Pacing Threshold Pulse Width: 0.4 ms
Lead Channel Setting Pacing Amplitude: 1.5 V
Lead Channel Setting Pacing Amplitude: 2 V
Lead Channel Setting Pacing Pulse Width: 0.4 ms
Lead Channel Setting Sensing Sensitivity: 5.6 mV
Zone Setting Status: 755011
Zone Setting Status: 755011

## 2023-10-12 NOTE — Progress Notes (Signed)
Remote pacemaker transmission.   

## 2023-10-25 ENCOUNTER — Other Ambulatory Visit: Payer: Self-pay | Admitting: Nurse Practitioner

## 2023-12-06 ENCOUNTER — Other Ambulatory Visit: Payer: Self-pay

## 2023-12-06 MED ORDER — METOPROLOL TARTRATE 25 MG PO TABS: 12.5000 mg | ORAL_TABLET | Freq: Two times a day (BID) | ORAL | 0 refills | Status: DC

## 2023-12-06 NOTE — Telephone Encounter (Signed)
Requested Prescriptions   Signed Prescriptions Disp Refills   metoprolol tartrate (LOPRESSOR) 25 MG tablet 90 tablet 0    Sig: Take 0.5 tablets (12.5 mg total) by mouth 2 (two) times daily.    Authorizing Provider: Debbe Odea    Ordering User: Guerry Minors   Last visit 02/24/23 with plan to f/u in 12 months.    Next visit: 02/27/24

## 2023-12-18 ENCOUNTER — Other Ambulatory Visit: Payer: Self-pay | Admitting: Nurse Practitioner

## 2023-12-30 ENCOUNTER — Ambulatory Visit (INDEPENDENT_AMBULATORY_CARE_PROVIDER_SITE_OTHER): Payer: Medicare Other

## 2023-12-30 DIAGNOSIS — I495 Sick sinus syndrome: Secondary | ICD-10-CM | POA: Diagnosis not present

## 2023-12-30 LAB — CUP PACEART REMOTE DEVICE CHECK
Battery Impedance: 2602 Ohm
Battery Remaining Longevity: 33 mo
Battery Voltage: 2.73 V
Brady Statistic AP VP Percent: 0 %
Brady Statistic AP VS Percent: 41 %
Brady Statistic AS VP Percent: 0 %
Brady Statistic AS VS Percent: 59 %
Date Time Interrogation Session: 20250103065401
Implantable Lead Connection Status: 753985
Implantable Lead Connection Status: 753985
Implantable Lead Implant Date: 20130122
Implantable Lead Implant Date: 20130122
Implantable Lead Location: 753859
Implantable Lead Location: 753860
Implantable Lead Model: 4092
Implantable Lead Model: 5076
Implantable Pulse Generator Implant Date: 20130122
Lead Channel Impedance Value: 375 Ohm
Lead Channel Impedance Value: 677 Ohm
Lead Channel Pacing Threshold Amplitude: 0.375 V
Lead Channel Pacing Threshold Amplitude: 0.625 V
Lead Channel Pacing Threshold Pulse Width: 0.4 ms
Lead Channel Pacing Threshold Pulse Width: 0.4 ms
Lead Channel Setting Pacing Amplitude: 1.5 V
Lead Channel Setting Pacing Amplitude: 2 V
Lead Channel Setting Pacing Pulse Width: 0.4 ms
Lead Channel Setting Sensing Sensitivity: 5.6 mV
Zone Setting Status: 755011
Zone Setting Status: 755011

## 2024-01-17 ENCOUNTER — Other Ambulatory Visit: Payer: Self-pay | Admitting: Internal Medicine

## 2024-01-25 ENCOUNTER — Encounter: Payer: Self-pay | Admitting: Internal Medicine

## 2024-02-06 NOTE — Progress Notes (Signed)
 Remote pacemaker transmission.

## 2024-02-09 ENCOUNTER — Ambulatory Visit: Payer: Medicare Other | Admitting: Nurse Practitioner

## 2024-02-09 VITALS — BP 110/60 | HR 62 | Temp 97.6°F | Ht 63.0 in | Wt 165.4 lb

## 2024-02-09 DIAGNOSIS — Y92009 Unspecified place in unspecified non-institutional (private) residence as the place of occurrence of the external cause: Secondary | ICD-10-CM | POA: Insufficient documentation

## 2024-02-09 DIAGNOSIS — I10 Essential (primary) hypertension: Secondary | ICD-10-CM | POA: Diagnosis not present

## 2024-02-09 DIAGNOSIS — G3184 Mild cognitive impairment, so stated: Secondary | ICD-10-CM | POA: Diagnosis not present

## 2024-02-09 LAB — BASIC METABOLIC PANEL
BUN: 21 mg/dL (ref 6–23)
CO2: 30 meq/L (ref 19–32)
Calcium: 10.8 mg/dL — ABNORMAL HIGH (ref 8.4–10.5)
Chloride: 99 meq/L (ref 96–112)
Creatinine, Ser: 0.86 mg/dL (ref 0.40–1.20)
GFR: 60.54 mL/min (ref >60.00)
Glucose, Bld: 90 mg/dL (ref 70–99)
Potassium: 4.9 meq/L (ref 3.5–5.1)
Sodium: 136 meq/L (ref 135–145)

## 2024-02-09 LAB — CBC
HCT: 41.1 % (ref 36.0–46.0)
Hemoglobin: 13.5 g/dL (ref 12.0–15.0)
MCHC: 32.8 g/dL (ref 30.0–36.0)
MCV: 94.6 fL (ref 78.0–100.0)
Platelets: 165 10*3/uL (ref 150.0–400.0)
RBC: 4.35 Mil/uL (ref 3.87–5.11)
RDW: 14.2 % (ref 11.5–15.5)
WBC: 7.3 10*3/uL (ref 4.0–10.5)

## 2024-02-09 LAB — VITAMIN B12: Vitamin B-12: >1537 pg/mL — ABNORMAL HIGH (ref 211–911)

## 2024-02-09 NOTE — Progress Notes (Signed)
Established Patient Office Visit  Subjective   Patient ID: Regina Hernandez, female    DOB: [birth year]  Age: 89 y.o. MRN: [id number]  Chief Complaint  Patient presents with   Follow-up    Pt complains of doing very good since last visit.     HPI  HTN: she is on lopressor and furesomide.  Tolerates medication well.  Denies lightheadedness or dizziness.  Does not check blood pressure at home  Sleep:states that she goes to bed aroudn 8 and gets up around 6. Feels rested and does snore   Memory: states that she feels the memory is not doing good. States that it is going more each day. States that she has trouble recalling certain things like movie stars names. Or she can be thinking of sometihgn and then a few minutes later forget what she   Fall: she fell around [place]. State that she got up and was reaching for the wall and it was not there. Stats that she did not get evaluated and did not LOC      02/09/2024   10:17 AM 08/11/2022   11:50 AM 04/22/2021    9:52 AM  MMSE - Mini Mental State Exam  Orientation to time 5 5 4   Orientation to Place 3 5 5   Registration 3 3 0  Attention/ Calculation 5 5 1   Recall 2 3 3   Language- name 2 objects 2 2 2   Language- repeat 1 1 1   Language- follow 3 step command 3 3 3   Language- read & follow direction 1 1 1   Write a sentence 1 1 1   Copy design 1 0 1  Total score 27 29 22        Review of Systems  Constitutional:  Negative for chills and fever.  Respiratory:  Negative for shortness of breath.   Cardiovascular:  Negative for chest pain.  Gastrointestinal:  Negative for abdominal pain, constipation and diarrhea.       BM daily  Neurological:  Negative for headaches.      Objective:     BP 110/60   Pulse 62   Temp 97.6 F (36.4 C) (Oral)   Ht 5\' 3"  (1.6 m)   Wt 165 lb 6.4 oz (75 kg)   SpO2 96%   BMI 29.30 kg/m  BP Readings from Last 3 Encounters:  02/09/24 110/60  08/15/23 (!) 134/92  08/09/23 110/76   Wt Readings  from Last 3 Encounters:  02/09/24 165 lb 6.4 oz (75 kg)  08/15/23 161 lb (73 kg)  08/09/23 161 lb 12.8 oz (73.4 kg)   SpO2 Readings from Last 3 Encounters:  02/09/24 96%  08/09/23 96%  06/13/23 98%      Physical Exam Vitals and nursing note reviewed.  Constitutional:      Appearance: Normal appearance.  Cardiovascular:     Rate and Rhythm: Normal rate and regular rhythm.     Heart sounds: Normal heart sounds.  Pulmonary:     Effort: Pulmonary effort is normal.     Breath sounds: Normal breath sounds.  Abdominal:     General: Bowel sounds are normal.  Neurological:     Mental Status: She is alert.      Results for orders placed or performed in visit on 02/09/24  CBC  Result Value Ref Range   WBC 7.3 4.0 - 10.5 K/uL   RBC 4.35 3.87 - 5.11 Mil/uL   Platelets 165.0 150.0 - 400.0 K/uL   Hemoglobin 13.5 12.0 - 15.0 g/dL  HCT 41.1 36.0 - 46.0 %   MCV 94.6 78.0 - 100.0 fl   MCHC 32.8 30.0 - 36.0 g/dL   RDW 25.3 66.4 - 40.3 %  Basic metabolic panel  Result Value Ref Range   Sodium 136 135 - 145 mEq/L   Potassium 4.9 3.5 - 5.1 mEq/L   Chloride 99 96 - 112 mEq/L   CO2 30 19 - 32 mEq/L   Glucose, Bld 90 70 - 99 mg/dL   BUN 21 6 - 23 mg/dL   Creatinine, Ser 4.74 0.40 - 1.20 mg/dL   GFR 25.95 >63.87 mL/min   Calcium 10.8 (H) 8.4 - 10.5 mg/dL  Vitamin F64  Result Value Ref Range   Vitamin B-12 >1537 (H) 211 - 911 pg/mL      The ASCVD Risk score (Arnett DK, et al., 2019) failed to calculate for the following reasons:   The 2019 ASCVD risk score is only valid for ages 43 to 90    Assessment & Plan:   Problem List Items Addressed This Visit       Cardiovascular and Mediastinum   Essential hypertension (Chronic)   Patient currently maintained on Lopressor and furosemide.  Blood pressure well-controlled.  Patient is not having any adverse drug event.  Continue medication as prescribed      Relevant Orders   CBC (Completed)   Basic metabolic panel (Completed)      Nervous and Auditory   Mild cognitive impairment with memory loss - Primary (Chronic)   Bili history of MMSE today.  Patient has had a 2 point decrease in a year and a half.  Patient currently maintained on Namenda.  Continue medication as prescribed      Relevant Orders   Vitamin B12 (Completed)     Other   Fall at home   Patient had a mechanical fall.  No LOC no great injury.  Patient is negative evaluated.  Patient is not having any concerns or problems at this juncture.       Return in about 6 months (around 08/08/2024) for "CPE".    Audria Nine, NP

## 2024-02-09 NOTE — Patient Instructions (Signed)
Nice to see you today I will be in touch with the labs once I have them Follow up with me in 6 months for your physical

## 2024-02-09 NOTE — Assessment & Plan Note (Signed)
Patient currently maintained on Lopressor and furosemide.  Blood pressure well-controlled.  Patient is not having any adverse drug event.  Continue medication as prescribed

## 2024-02-09 NOTE — Assessment & Plan Note (Signed)
Patient had a mechanical fall.  No LOC no great injury.  Patient is negative evaluated.  Patient is not having any concerns or problems at this juncture.

## 2024-02-09 NOTE — Assessment & Plan Note (Addendum)
Bili history of MMSE today.  Patient has had a 2 point decrease in a year and a half.  Patient currently maintained on Namenda.  Continue medication as prescribed

## 2024-02-10 ENCOUNTER — Other Ambulatory Visit: Payer: Self-pay | Admitting: Nurse Practitioner

## 2024-02-14 ENCOUNTER — Encounter: Payer: Self-pay | Admitting: Nurse Practitioner

## 2024-02-27 ENCOUNTER — Encounter: Payer: Self-pay | Admitting: Cardiology

## 2024-02-27 ENCOUNTER — Ambulatory Visit: Payer: Medicare Other | Attending: Cardiology | Admitting: Cardiology

## 2024-02-27 VITALS — BP 178/115 | HR 60 | Ht 63.0 in | Wt 162.8 lb

## 2024-02-27 DIAGNOSIS — I951 Orthostatic hypotension: Secondary | ICD-10-CM | POA: Diagnosis not present

## 2024-02-27 DIAGNOSIS — Z950 Presence of cardiac pacemaker: Secondary | ICD-10-CM | POA: Diagnosis not present

## 2024-02-27 NOTE — Patient Instructions (Signed)
Medication Instructions:  No changes at this time.   *If you need a refill on your cardiac medications before your next appointment, please call your pharmacy*   Lab Work: None  If you have labs (blood work) drawn today and your tests are completely normal, you will receive your results only by: [place] (if you have MyChart) OR A paper copy in the mail If you have any lab test that is abnormal or we need to change your treatment, we will call you to review the results.   Testing/Procedures: None   Follow-Up: At [facility], you and your health needs are our priority.  As part of our continuing mission to provide you with exceptional heart care, we have created designated Provider Care Teams.  These Care Teams include your primary Cardiologist (physician) and Advanced Practice Providers (APPs -  Physician Assistants and Nurse Practitioners) who all work together to provide you with the care you need, when you need it.   Your next appointment:   1 year(s)  Provider:   Kate Sable, MD

## 2024-02-27 NOTE — Progress Notes (Signed)
 Cardiology Office Note:    Date:  02/27/2024   ID:  Regina Hernandez, DOB May 21, 1934, MRN [id number]  PCP:  Eden Emms, NP  Cardiologist:  Debbe Odea, MD  Electrophysiologist:  None   Referring MD: Eden Emms, NP   Chief Complaint  Patient presents with   Follow-up    12 month follow up visit. Patient is doing well on today. Meds reviewed .    History of Present Illness:    Regina Hernandez is a 89 y.o. female with a hx of anxiety, bradycardia, orthostatic hypotension, sick sinus syndrome s/p permanent pacemaker (medtronic in 2013) who presents for follow-up.  Doing okay, denies dizziness, syncope.  Recently came back from a trip to [place] to visit his son.  Denies any falls.  Does not check her BP at home frequently.  Has no concerns today.   Prior notes Echo 03/2022 EF 60 to 65%, mild aortic valve sclerosis with no stenosis Left heart cath in 2012 at [facility] health showed minimal irregularities in the LAD and RCA. Patient was  admitted to the hospital in June 2021 due to bloody stools, diagnosed with peptic ulcer disease, received 2 units of packed red blood cells.  Aspirin was stopped.   Echocardiogram 05/2020 showed normal systolic function, impaired relaxation, EF 65%.  Past Medical History:  Diagnosis Date   Anxiety    Cervical dystonia    Essential tremor    GI bleed    Memory loss    Migraine    Occipital neuralgia     Past Surgical History:  Procedure Laterality Date   BOWEL RESECTION     CARDIAC CATHETERIZATION  2016   no stents/[facility]   ESOPHAGOGASTRODUODENOSCOPY N/A 06/18/2020   Procedure: ESOPHAGOGASTRODUODENOSCOPY (EGD);  Surgeon: Toledo, Boykin Nearing, MD;  Location: [facility] ENDOSCOPY;  Service: Gastroenterology;  Laterality: N/A;   ESOPHAGOGASTRODUODENOSCOPY N/A 08/29/2022   Procedure: ESOPHAGOGASTRODUODENOSCOPY (EGD);  Surgeon: Jenel Lucks, MD;  Location: [facility] ENDOSCOPY;  Service: Gastroenterology;  Laterality: N/A;    ESOPHAGOGASTRODUODENOSCOPY N/A 08/27/2022   Procedure: ESOPHAGOGASTRODUODENOSCOPY (EGD);  Surgeon: Toledo, Boykin Nearing, MD;  Location: [facility] ENDOSCOPY;  Service: Gastroenterology;  Laterality: N/A;   ESOPHAGOGASTRODUODENOSCOPY (EGD) WITH PROPOFOL N/A 08/28/2022   Procedure: ESOPHAGOGASTRODUODENOSCOPY (EGD) WITH PROPOFOL;  Surgeon: Toledo, Boykin Nearing, MD;  Location: [facility] ENDOSCOPY;  Service: Gastroenterology;  Laterality: N/A;   IR ANGIOGRAM VISCERAL SELECTIVE  08/29/2022   IR ANGIOGRAM VISCERAL SELECTIVE  08/29/2022   IR ANGIOGRAM VISCERAL SELECTIVE  08/29/2022   IR AORTAGRAM ABDOMINAL SERIALOGRAM  08/29/2022   IR EMBO ART  VEN HEMORR LYMPH EXTRAV  INC GUIDE ROADMAPPING  08/29/2022   IR US GUIDE VASC ACCESS RIGHT  08/29/2022   PACEMAKER IMPLANT     REPLACEMENT TOTAL KNEE Left     Current Medications: Current Meds  Medication Sig   acetaminophen (TYLENOL) 500 MG tablet Take 1,000 mg by mouth 2 (two) times daily.   ARIPiprazole (ABILIFY) 5 MG tablet Take 5 mg by mouth every morning.   atorvastatin (LIPITOR) 10 MG tablet TAKE 1 TABLET BY MOUTH EVERY DAY   busPIRone (BUSPAR) 10 MG tablet Take 10 mg by mouth 2 (two) times daily.   cholecalciferol (VITAMIN D3) 25 MCG (1000 UNIT) tablet Take 1,000 Units by mouth daily.   docusate sodium (COLACE) 100 MG capsule Take 100 mg by mouth daily as needed for mild constipation.   furosemide (LASIX) 20 MG tablet TAKE 1 TABLET BY MOUTH 3 TIMES PER WEEK   gabapentin (NEURONTIN) 400 MG capsule  TAKE 1 CAPSULE BY MOUTH TWICE A DAY   LORazepam (ATIVAN) 0.5 MG tablet Take 0.5 mg by mouth 2 (two) times daily as needed.   memantine (NAMENDA) 10 MG tablet TAKE 1 TABLET BY MOUTH TWICE A DAY   metoprolol tartrate (LOPRESSOR) 25 MG tablet Take 0.5 tablets (12.5 mg total) by mouth 2 (two) times daily.   nitrofurantoin (MACRODANTIN) 100 MG capsule Take 1 capsule (100 mg total) by mouth daily.   pantoprazole (PROTONIX) 40 MG tablet Take 1 tablet (40 mg total) by mouth daily.   primidone  (MYSOLINE) 50 MG tablet Take 100 mg by mouth daily.   venlafaxine XR (EFFEXOR XR) 150 MG 24 hr capsule Take 150 mg by mouth daily with breakfast.   vitamin B-12 (CYANOCOBALAMIN) 500 MCG tablet Take 500 mcg by mouth daily.     Allergies:   Adhesive [tape], Baclofen, Lyrica [pregabalin], and Sulfa antibiotics   Social History   Socioeconomic History   Marital status: Widowed    Spouse name: Not on file   Number of children: 5   Years of education: 12th   Highest education level: 12th grade  Occupational History   Occupation: Retired  Tobacco Use   Smoking status: Never    Passive exposure: Never   Smokeless tobacco: Never  Vaping Use   Vaping status: Never Used  Substance and Sexual Activity   Alcohol use: Not Currently   Drug use: Never   Sexual activity: Not Currently  Other Topics Concern   Not on file  Social History Narrative   Lives at home with her daughters.   Right-handed.   Two cups caffeine per day.      02/10/21   From: [place] - moved to [facility] [place]   Living: with daughter Dois Davenport and Marcelle Smiling granddaughter and Selena Batten (daughter)   Work: retired - daycare       Family: Lives with daughters - Selena Batten and Dois Davenport, 3 other children across the country - too many to count ~15, a few great-grandchildren      Enjoys: play cards, word search books, watch TV, relaxing outside      Exercise: not currently   Diet: good appetite, cauliflower, chicken, likes most things      Safety   Seat belts: Yes    Guns: No   Safe in relationships: Yes    Social Drivers of Corporate investment banker Strain: Low Risk  (02/09/2024)   Overall Financial Resource Strain (CARDIA)    Difficulty of Paying Living Expenses: Not hard at all  Food Insecurity: No Food Insecurity (02/09/2024)   Hunger Vital Sign    Worried About Running Out of Food in the Last Year: Never true    Ran Out of Food in the Last Year: Never true  Transportation Needs: No Transportation Needs (02/09/2024)   PRAPARE -  Administrator, Civil Service (Medical): No    Lack of Transportation (Non-Medical): No  Physical Activity: Unknown (02/09/2024)   Exercise Vital Sign    Days of Exercise per Week: 0 days    Minutes of Exercise per Session: Not on file  Stress: No Stress Concern Present (02/09/2024)   [employer] of Occupational Health - Occupational Stress Questionnaire    Feeling of Stress : Not at all  Social Connections: Socially Isolated (02/09/2024)   Social Connection and Isolation Panel [NHANES]    Frequency of Communication with Friends and Family: Three times a week    Frequency of Social Gatherings with Friends  and Family: More than three times a week    Attends Religious Services: Never    Active Member of Clubs or Organizations: No    Attends Banker Meetings: Not on file    Marital Status: Widowed     Family History: The patient's family history includes Other in her mother; Stomach cancer in her brother; Ulcers in her father.  ROS:   Please see the history of present illness.     All other systems reviewed and are negative.  EKGs/Labs/Other Studies Reviewed:    The following studies were reviewed today:  EKG Interpretation Date/Time:  Monday February 27 2024 10:05:01 EST Ventricular Rate:  60 PR Interval:  270 QRS Duration:  78 QT Interval:  442 QTC Calculation: 442 R Axis:   -36  Text Interpretation: Atrial-paced rhythm with prolonged AV conduction Left axis deviation Moderate voltage criteria for LVH, may be normal variant ( R in aVL , Cornell product ) Confirmed by Debbe Odea ([id number]) on 02/27/2024 10:23:09 AM    Recent Labs: 08/09/2023: ALT 11 02/09/2024: BUN 21; Creatinine, Ser 0.86; Hemoglobin 13.5; Platelets 165.0; Potassium 4.9; Sodium 136  Recent Lipid Panel    Component Value Date/Time   CHOL 170 08/09/2023 1037   TRIG 135.0 08/09/2023 1037   HDL 76.20 08/09/2023 1037   CHOLHDL 2 08/09/2023 1037   VLDL 27.0 08/09/2023 1037    LDLCALC 67 08/09/2023 1037    Physical Exam:    VS:  BP (!) 178/115 Comment: right arm  Pulse 60   Ht 5\' 3"  (1.6 m)   Wt 162 lb 12.8 oz (73.8 kg)   SpO2 95%   BMI 28.84 kg/m     Wt Readings from Last 3 Encounters:  02/27/24 162 lb 12.8 oz (73.8 kg)  02/09/24 165 lb 6.4 oz (75 kg)  08/15/23 161 lb (73 kg)     GEN:  Well nourished, well developed in no acute distress HEENT: Normal NECK: No JVD; No carotid bruits CARDIAC: RRR, 1/6 systolic murmur RESPIRATORY:  Clear to auscultation without rales, wheezing or rhonchi  ABDOMEN: Soft, non-tender, non-distended MUSCULOSKELETAL:  No edema; No deformity  SKIN: Warm and dry NEUROLOGIC:  Alert and oriented x 3, resting tremors noted PSYCHIATRIC:  Normal affect   ASSESSMENT:    1. Orthostatic hypotension   2. Pacemaker    PLAN:    In order of problems listed above:  History of dizziness, orthostatic hypotension, currently resolved. BP unusually elevated today, usually in the low 110s to 130s systolic.  Continue Lopressor 12.5 mg twice daily, okay to let BP run high, up to 150s systolic okay, with her history of orthostatic hypotension. Hx of bradycardia, sick sinus syndrome s/p pacemaker.  keep appointments with device clinic for pacemaker checks.   . Follow-up in 1 year  This note was generated in part or whole with voice recognition software. Voice recognition is usually quite accurate but there are transcription errors that can and very often do occur. I apologize for any typographical errors that were not detected and corrected.  Medication Adjustments/Labs and Tests Ordered: Current medicines are reviewed at length with the patient today.  Concerns regarding medicines are outlined above.  Orders Placed This Encounter  Procedures   EKG 12-Lead   No orders of the defined types were placed in this encounter.   Patient Instructions  Medication Instructions:  No changes at this time.   *If you need a refill on your  cardiac medications before your next appointment, please  call your pharmacy*   Lab Work: None  If you have labs (blood work) drawn today and your tests are completely normal, you will receive your results only by: MyChart Message (if you have MyChart) OR A paper copy in the mail If you have any lab test that is abnormal or we need to change your treatment, we will call you to review the results.   Testing/Procedures: None   Follow-Up: At [facility], you and your health needs are our priority.  As part of our continuing mission to provide you with exceptional heart care, we have created designated Provider Care Teams.  These Care Teams include your primary Cardiologist (physician) and Advanced Practice Providers (APPs -  Physician Assistants and Nurse Practitioners) who all work together to provide you with the care you need, when you need it.    Your next appointment:   1 year(s)  Provider:   Debbe Odea, MD      Signed, Debbe Odea, MD  02/27/2024 11:10 AM     [facility]

## 2024-03-02 ENCOUNTER — Other Ambulatory Visit: Payer: Self-pay

## 2024-03-02 MED ORDER — METOPROLOL TARTRATE 25 MG PO TABS: 12.5000 mg | ORAL_TABLET | Freq: Two times a day (BID) | ORAL | 3 refills | Status: DC

## 2024-03-13 ENCOUNTER — Encounter: Payer: Self-pay | Admitting: Internal Medicine

## 2024-04-02 ENCOUNTER — Ambulatory Visit (INDEPENDENT_AMBULATORY_CARE_PROVIDER_SITE_OTHER): Payer: Medicare Other

## 2024-04-02 DIAGNOSIS — I495 Sick sinus syndrome: Secondary | ICD-10-CM

## 2024-04-02 DIAGNOSIS — I472 Ventricular tachycardia, unspecified: Secondary | ICD-10-CM

## 2024-04-02 LAB — CUP PACEART REMOTE DEVICE CHECK
Battery Impedance: 2618 Ohm
Battery Remaining Longevity: 33 mo
Battery Voltage: 2.73 V
Brady Statistic AP VP Percent: 0 %
Brady Statistic AP VS Percent: 41 %
Brady Statistic AS VP Percent: 0 %
Brady Statistic AS VS Percent: 59 %
Date Time Interrogation Session: 20250407072336
Implantable Lead Connection Status: 753985
Implantable Lead Connection Status: 753985
Implantable Lead Implant Date: 20130122
Implantable Lead Implant Date: 20130122
Implantable Lead Location: 753859
Implantable Lead Location: 753860
Implantable Lead Model: 4092
Implantable Lead Model: 5076
Implantable Pulse Generator Implant Date: 20130122
Lead Channel Impedance Value: 367 Ohm
Lead Channel Impedance Value: 738 Ohm
Lead Channel Pacing Threshold Amplitude: 0.5 V
Lead Channel Pacing Threshold Amplitude: 0.625 V
Lead Channel Pacing Threshold Pulse Width: 0.4 ms
Lead Channel Pacing Threshold Pulse Width: 0.4 ms
Lead Channel Setting Pacing Amplitude: 1.5 V
Lead Channel Setting Pacing Amplitude: 2 V
Lead Channel Setting Pacing Pulse Width: 0.4 ms
Lead Channel Setting Sensing Sensitivity: 5.6 mV
Zone Setting Status: 755011
Zone Setting Status: 755011

## 2024-04-07 ENCOUNTER — Encounter: Payer: Self-pay | Admitting: Internal Medicine

## 2024-04-11 ENCOUNTER — Other Ambulatory Visit: Payer: Self-pay | Admitting: Internal Medicine

## 2024-04-18 ENCOUNTER — Other Ambulatory Visit: Payer: Self-pay | Admitting: Nurse Practitioner

## 2024-04-19 ENCOUNTER — Ambulatory Visit: Payer: Medicare Other | Attending: Internal Medicine | Admitting: Internal Medicine

## 2024-04-19 VITALS — BP 145/104 | HR 60 | Ht 63.0 in | Wt 161.0 lb

## 2024-04-19 DIAGNOSIS — I951 Orthostatic hypotension: Secondary | ICD-10-CM

## 2024-04-19 DIAGNOSIS — I10 Essential (primary) hypertension: Secondary | ICD-10-CM | POA: Diagnosis present

## 2024-04-19 DIAGNOSIS — I472 Ventricular tachycardia, unspecified: Secondary | ICD-10-CM | POA: Diagnosis not present

## 2024-04-19 DIAGNOSIS — Z950 Presence of cardiac pacemaker: Secondary | ICD-10-CM

## 2024-04-19 DIAGNOSIS — R55 Syncope and collapse: Secondary | ICD-10-CM

## 2024-04-19 DIAGNOSIS — I495 Sick sinus syndrome: Secondary | ICD-10-CM

## 2024-04-19 NOTE — Patient Instructions (Signed)
 Medication Instructions:  The current medical regimen is effective;  continue present plan and medications as directed. Please refer to the Current Medication list given to you today.   *If you need a refill on your cardiac medications before your next appointment, please call your pharmacy*   Follow-Up: At [facility], you and your health needs are our priority.  As part of our continuing mission to provide you with exceptional heart care, our providers are all part of one team.  This team includes your primary Cardiologist (physician) and Advanced Practice Providers or APPs (Physician Assistants and Nurse Practitioners) who all work together to provide you with the care you need, when you need it.  Your next appointment:   12 month(s)  Provider:   Ardeen Kohler, MD or Suzann Riddle, NP    We recommend signing up for the patient portal called "MyChart".  Sign up information is provided on this After Visit Summary.  MyChart is used to connect with patients for Virtual Visits (Telemedicine).  Patients are able to view lab/test results, encounter notes, upcoming appointments, etc.  Non-urgent messages can be sent to your provider as well.   To learn more about what you can do with MyChart, go to [email or web address].

## 2024-04-19 NOTE — Progress Notes (Signed)
 Patient Care Team: Dorothe Gaster, NP as PCP - General (Nurse Practitioner) Constancia Delton, MD as PCP - Cardiology (Cardiology) Phebe Brasil, MD as Consulting Physician (Neurology) Constancia Delton, MD as Consulting Physician (Cardiology) Christina Coyer, MD as Consulting Physician (Urology) Alba Ally, MD as Consulting Physician (Psychiatry)   HPI  Regina Hernandez is a 89 y.o. female seen in followup for pacemaker Medtronic implanted elsewhere remotely for tachybradycardia syndrome . Gen change 2012 Cx by sepsis requiring extraction and reimplantation 9/12.  Hx of afib. Paroxsymal  Hospitalization 9/23 for recurrent GI bleeding, anticoagulation was discontinued   The patient denies chest pain, nocturnal dyspnea, orthopnea .  There have been no palpitations, lightheadedness or syncope.  Complains of mild dyspnea and occasional edema.        DATE TEST EF    9/17 MYOVIEW    % No ischemia  10/20 Echo  55-65%    3/21 Echo 55-60%    3/22 Echo  60-65%   4/23 Echo  60-65% AoSclerosis     Date Cr K Hgb  2/21 1.0 4.9 12.8   7/21  0.7 3.2<<2.8    2/22   12.7  12/22 0.92 4.8 13.9  3/24 0.85 3.7 12.2  2/25 0.86 4.9 13.5      Records and Results Reviewed   Past Medical History:  Diagnosis Date   Anxiety    Cervical dystonia    Essential tremor    GI bleed    Memory loss    Migraine    Occipital neuralgia     Past Surgical History:  Procedure Laterality Date   BOWEL RESECTION     CARDIAC CATHETERIZATION  2016   no stents/[facility]   ESOPHAGOGASTRODUODENOSCOPY N/A 06/18/2020   Procedure: ESOPHAGOGASTRODUODENOSCOPY (EGD);  Surgeon: Toledo, Alphonsus Jeans, MD;  Location: [facility] ENDOSCOPY;  Service: Gastroenterology;  Laterality: N/A;   ESOPHAGOGASTRODUODENOSCOPY N/A 08/29/2022   Procedure: ESOPHAGOGASTRODUODENOSCOPY (EGD);  Surgeon: Elois Hair, MD;  Location[facility] ENDOSCOPY;  Service: Gastroenterology;  Laterality: N/A;    ESOPHAGOGASTRODUODENOSCOPY N/A 08/27/2022   Procedure: ESOPHAGOGASTRODUODENOSCOPY (EGD);  Surgeon: Toledo, Alphonsus Jeans, MD;  Location: [facility] ENDOSCOPY;  Service: Gastroenterology;  Laterality: N/A;   ESOPHAGOGASTRODUODENOSCOPY (EGD) WITH PROPOFOL  N/A 08/28/2022   Procedure: ESOPHAGOGASTRODUODENOSCOPY (EGD) WITH PROPOFOL ;  Surgeon: Toledo, Alphonsus Jeans, MD;  Location: [facility] ENDOSCOPY;  Service: Gastroenterology;  Laterality: N/A;   IR ANGIOGRAM VISCERAL SELECTIVE  08/29/2022   IR ANGIOGRAM VISCERAL SELECTIVE  08/29/2022   IR ANGIOGRAM VISCERAL SELECTIVE  08/29/2022   IR AORTAGRAM ABDOMINAL SERIALOGRAM  08/29/2022   IR EMBO ART  VEN HEMORR LYMPH EXTRAV  INC GUIDE ROADMAPPING  08/29/2022   IR US  GUIDE VASC ACCESS RIGHT  08/29/2022   PACEMAKER IMPLANT     REPLACEMENT TOTAL KNEE Left     Current Meds  Medication Sig   acetaminophen  (TYLENOL ) 500 MG tablet Take 1,000 mg by mouth 2 (two) times daily.   ARIPiprazole  (ABILIFY ) 5 MG tablet Take 5 mg by mouth every morning.   atorvastatin  (LIPITOR) 10 MG tablet TAKE 1 TABLET BY MOUTH EVERY DAY   busPIRone  (BUSPAR ) 10 MG tablet Take 10 mg by mouth 2 (two) times daily.   cholecalciferol  (VITAMIN D3) 25 MCG (1000 UNIT) tablet Take 1,000 Units by mouth daily.   docusate sodium  (COLACE) 100 MG capsule Take 100 mg by mouth daily as needed for mild constipation.   furosemide  (LASIX ) 20 MG tablet TAKE 1 TABLET BY MOUTH 3 TIMES PER WEEK   gabapentin  (NEURONTIN )  400 MG capsule TAKE 1 CAPSULE BY MOUTH TWICE A DAY   LORazepam  (ATIVAN ) 0.5 MG tablet Take 0.5 mg by mouth 2 (two) times daily as needed.   memantine  (NAMENDA ) 10 MG tablet TAKE 1 TABLET BY MOUTH TWICE A DAY   metoprolol  tartrate (LOPRESSOR ) 25 MG tablet Take 0.5 tablets (12.5 mg total) by mouth 2 (two) times daily.   nitrofurantoin  (MACRODANTIN ) 100 MG capsule Take 1 capsule (100 mg total) by mouth daily.   pantoprazole  (PROTONIX ) 40 MG tablet Take 1 tablet (40 mg total) by mouth daily.   primidone  (MYSOLINE ) 50 MG tablet  Take 100 mg by mouth daily.   venlafaxine  XR (EFFEXOR  XR) 150 MG 24 hr capsule Take 150 mg by mouth daily with breakfast.   vitamin B-12 (CYANOCOBALAMIN ) 500 MCG tablet Take 500 mcg by mouth daily.    Allergies  Allergen Reactions   Adhesive [Tape] Other (See Comments)    Contact dermatitis   Baclofen Rash   Lyrica [Pregabalin] Other (See Comments)    Unable to remember reaction.   Sulfa Antibiotics Rash      Review of Systems negative except from HPI and PMH  Physical Exam BP (!) 145/104 (BP Location: Left Arm)   Pulse 60   Ht 5\' 3"  (1.6 m)   Wt 161 lb (73 kg)   SpO2 95%   BMI 28.52 kg/m  Well developed and well nourished in no acute distress HENT normal Neck supple with JVP-flat Clear Device pocket well healed; without hematoma or erythema.  There is no tethering  Regular rate and rhythm, no  gallop No  murmur Abd-soft with active BS No Clubbing cyanosis tr edema Skin-warm and dry A & Oriented  Grossly normal sensory and motor function  ECG A pacing @ 60 27/07/44 LVH   Device function is normal. Programming changes none  See Paceart for details       CrCl cannot be calculated (Patient's most recent lab result is older than the maximum 21 days allowed.).   Assessment and  Plan  Sinus node dysfunction-intermittent   Pacemaker-Medtronic (DOI 9/12)  Dyspnea on exertion  Hypertension  Atrial fibrillation-paroxysmal  GI bleeding-massive precluding anticoagulation  Orthostatic intolerance  Blood pressure is modestly elevated.  We will continue her on the low-dose metoprolol .  No interval atrial fibrillation.  Thankfully.  Remains off anticoagulation.  Get around the house without too much dyspnea.  Infrequent edema.  Using the diuretics few times a week.

## 2024-04-24 ENCOUNTER — Encounter: Payer: Self-pay | Admitting: Internal Medicine

## 2024-04-24 LAB — PACEMAKER DEVICE OBSERVATION

## 2024-04-26 ENCOUNTER — Other Ambulatory Visit: Payer: Self-pay | Admitting: Nurse Practitioner

## 2024-05-15 NOTE — Progress Notes (Signed)
 Remote pacemaker transmission.

## 2024-07-02 ENCOUNTER — Ambulatory Visit: Payer: Medicare Other

## 2024-07-02 DIAGNOSIS — I495 Sick sinus syndrome: Secondary | ICD-10-CM | POA: Diagnosis not present

## 2024-07-03 LAB — CUP PACEART REMOTE DEVICE CHECK
Battery Impedance: 2820 Ohm
Battery Remaining Longevity: 30 mo
Battery Voltage: 2.72 V
Brady Statistic AP VP Percent: 0 %
Brady Statistic AP VS Percent: 48 %
Brady Statistic AS VP Percent: 0 %
Brady Statistic AS VS Percent: 51 %
Date Time Interrogation Session: 20250707071122
Implantable Lead Connection Status: 753985
Implantable Lead Connection Status: 753985
Implantable Lead Implant Date: 20130122
Implantable Lead Implant Date: 20130122
Implantable Lead Location: 753859
Implantable Lead Location: 753860
Implantable Lead Model: 4092
Implantable Lead Model: 5076
Implantable Pulse Generator Implant Date: 20130122
Lead Channel Impedance Value: 357 Ohm
Lead Channel Impedance Value: 687 Ohm
Lead Channel Pacing Threshold Amplitude: 0.5 V
Lead Channel Pacing Threshold Amplitude: 0.625 V
Lead Channel Pacing Threshold Pulse Width: 0.4 ms
Lead Channel Pacing Threshold Pulse Width: 0.4 ms
Lead Channel Setting Pacing Amplitude: 1.5 V
Lead Channel Setting Pacing Amplitude: 2 V
Lead Channel Setting Pacing Pulse Width: 0.4 ms
Lead Channel Setting Sensing Sensitivity: 5.6 mV
Zone Setting Status: 755011
Zone Setting Status: 755011

## 2024-07-07 ENCOUNTER — Ambulatory Visit: Payer: Self-pay | Admitting: Cardiology

## 2024-08-04 ENCOUNTER — Other Ambulatory Visit: Payer: Self-pay | Admitting: Nurse Practitioner

## 2024-08-09 ENCOUNTER — Ambulatory Visit (INDEPENDENT_AMBULATORY_CARE_PROVIDER_SITE_OTHER): Payer: Medicare Other | Admitting: Nurse Practitioner

## 2024-08-09 ENCOUNTER — Encounter: Payer: Self-pay | Admitting: Nurse Practitioner

## 2024-08-09 VITALS — BP 110/74 | HR 95 | Temp 97.2°F | Ht 63.0 in | Wt 157.0 lb

## 2024-08-09 DIAGNOSIS — K264 Chronic or unspecified duodenal ulcer with hemorrhage: Secondary | ICD-10-CM | POA: Diagnosis not present

## 2024-08-09 DIAGNOSIS — G250 Essential tremor: Secondary | ICD-10-CM | POA: Diagnosis not present

## 2024-08-09 DIAGNOSIS — E213 Hyperparathyroidism, unspecified: Secondary | ICD-10-CM | POA: Diagnosis not present

## 2024-08-09 DIAGNOSIS — I10 Essential (primary) hypertension: Secondary | ICD-10-CM | POA: Diagnosis not present

## 2024-08-09 DIAGNOSIS — R269 Unspecified abnormalities of gait and mobility: Secondary | ICD-10-CM

## 2024-08-09 DIAGNOSIS — R519 Headache, unspecified: Secondary | ICD-10-CM

## 2024-08-09 DIAGNOSIS — G3184 Mild cognitive impairment, so stated: Secondary | ICD-10-CM | POA: Diagnosis not present

## 2024-08-09 DIAGNOSIS — G8929 Other chronic pain: Secondary | ICD-10-CM

## 2024-08-09 DIAGNOSIS — Z950 Presence of cardiac pacemaker: Secondary | ICD-10-CM

## 2024-08-09 LAB — COMPREHENSIVE METABOLIC PANEL WITH GFR
ALT: 17 U/L (ref 0–35)
AST: 25 U/L (ref 0–37)
Albumin: 4.1 g/dL (ref 3.5–5.2)
Alkaline Phosphatase: 61 U/L (ref 39–117)
BUN: 30 mg/dL — ABNORMAL HIGH (ref 6–23)
CO2: 30 meq/L (ref 19–32)
Calcium: 10 mg/dL (ref 8.4–10.5)
Chloride: 99 meq/L (ref 96–112)
Creatinine, Ser: 0.9 mg/dL (ref 0.40–1.20)
GFR: 57.12 mL/min — ABNORMAL LOW (ref >60.00)
Glucose, Bld: 103 mg/dL — ABNORMAL HIGH (ref 70–99)
Potassium: 4.8 meq/L (ref 3.5–5.1)
Sodium: 136 meq/L (ref 135–145)
Total Bilirubin: 0.4 mg/dL (ref 0.2–1.2)
Total Protein: 7.3 g/dL (ref 6.0–8.3)

## 2024-08-09 LAB — CBC
HCT: 40.4 % (ref 36.0–46.0)
Hemoglobin: 13.2 g/dL (ref 12.0–15.0)
MCHC: 32.6 g/dL (ref 30.0–36.0)
MCV: 93.3 fl (ref 78.0–100.0)
Platelets: 152 K/uL (ref 150.0–400.0)
RBC: 4.33 Mil/uL (ref 3.87–5.11)
RDW: 13.8 % (ref 11.5–15.5)
WBC: 7.5 K/uL (ref 4.0–10.5)

## 2024-08-09 LAB — LIPID PANEL
Cholesterol: 151 mg/dL (ref 0–200)
HDL: 70.6 mg/dL (ref >39.00)
LDL Cholesterol: 60 mg/dL (ref 0–99)
NonHDL: 80.86
Total CHOL/HDL Ratio: 2
Triglycerides: 104 mg/dL (ref 0.0–149.0)
VLDL: 20.8 mg/dL (ref 0.0–40.0)

## 2024-08-09 LAB — TSH: TSH: 3.39 u[IU]/mL (ref 0.35–5.50)

## 2024-08-09 NOTE — Assessment & Plan Note (Signed)
 Patient requires assistive device to get around

## 2024-08-09 NOTE — Progress Notes (Signed)
 Established Patient Office Visit  Subjective   Patient ID: Regina Hernandez, female    DOB: 30-Oct-1934  Age: 89 y.o. MRN: [id number]  Chief Complaint  Patient presents with   Annual Exam    HPI  HLD: Patient currently maintained on atorvastatin  10 mg daily  GAD: Patient currently maintained on venlafaxine  150 mg daily, BuSpar  10 mg twice daily, Abilify  5 mg daily she is followed by behavioral health specialist  Memory: Patient currently maintained on Namenda  10 mg  Arrhythmia: Patient currently maintained on metoprolol  12.5 mg twice daily and Lasix  20 mg 3 times a week  GERD: Patient currently maintained on Protonix  40 mg daily she is followed by gastroenterology Dr. Aundria   Followed by  Cardiology: dr. Merlyn (EP) and Dr. Fernande and Regina Hernandez Gastro: Dr. Aundria Dr. [facility] urology Dr. Birdia psychiatry for complete physical and follow up of chronic conditions.  Immunizations: -Tetanus: Completed in 2018 -Influenza: Out of season -Shingles:utd  -Pneumonia: Completed Prevnar 20 -COVID: Original series and booster  Diet: Fair diet. 3 meals a day and no snack Coffee in the am, booster once a day and then water  Exercise: No regular exercise.  Eye exam: needs updating 2 years ago. Glasses   Dental exam: dentures   Colonoscopy: Completed in aged out Lung Cancer Screening: N/A  Pap smear: Aged out   Mammogram: Refused patient would not like to continue  DEXA: Refused patient would not like to continue  Sleep: gong to bed around 8 and get up around 6-7. Feels rested. States that she will take 10mg  melatonin at night   Advanced directive:DNR at the house. Along with living will and hpoa       Review of Systems  Constitutional:  Negative for chills and fever.  Respiratory:  Negative for shortness of breath.   Cardiovascular:  Negative for chest pain and leg swelling.  Gastrointestinal:  Negative for abdominal pain, blood in stool, constipation, diarrhea,  nausea and vomiting.       BM daily   Genitourinary:  Negative for dysuria and hematuria.  Neurological:  Positive for headaches. Negative for dizziness and tingling.  Psychiatric/Behavioral:  Negative for hallucinations and suicidal ideas.       Objective:     BP 110/74   Pulse 95   Temp (!) 97.2 F (36.2 C) (Oral)   Ht 5' 3 (1.6 m)   Wt 157 lb (71.2 kg)   SpO2 97%   BMI 27.81 kg/m  BP Readings from Last 3 Encounters:  08/09/24 110/74  04/19/24 (!) 145/104  02/27/24 (!) 178/115   Wt Readings from Last 3 Encounters:  08/09/24 157 lb (71.2 kg)  04/19/24 161 lb (73 kg)  02/27/24 162 lb 12.8 oz (73.8 kg)   SpO2 Readings from Last 3 Encounters:  08/09/24 97%  04/19/24 95%  02/27/24 95%      Physical Exam Vitals and nursing note reviewed.  Constitutional:      Appearance: Normal appearance.  HENT:     Right Ear: Tympanic membrane, ear canal and external ear normal.     Left Ear: Tympanic membrane, ear canal and external ear normal.     Mouth/Throat:     Mouth: Mucous membranes are moist.     Pharynx: Oropharynx is clear.  Eyes:     Extraocular Movements: Extraocular movements intact.     Pupils: Pupils are equal, round, and reactive to light.  Cardiovascular:     Rate and Rhythm: Normal rate and regular rhythm.  Pulses: Normal pulses.     Heart sounds: Normal heart sounds.  Pulmonary:     Effort: Pulmonary effort is normal.     Breath sounds: Normal breath sounds.  Abdominal:     General: Bowel sounds are normal. There is no distension.     Palpations: There is no mass.     Tenderness: There is no abdominal tenderness.     Hernia: No hernia is present.  Musculoskeletal:     Right lower leg: No edema.     Left lower leg: No edema.  Lymphadenopathy:     Cervical: No cervical adenopathy.  Skin:    General: Skin is warm.  Neurological:     General: No focal deficit present.     Mental Status: She is alert.     Deep Tendon Reflexes:     Reflex  Scores:      Bicep reflexes are 2+ on the right side and 2+ on the left side.      Patellar reflexes are 2+ on the right side and 0 on the left side.    Comments: Bilateral upper and lower extremity strength 5/5  Psychiatric:        Mood and Affect: Mood normal.        Behavior: Behavior normal.        Thought Content: Thought content normal.        Judgment: Judgment normal.      No results found for any visits on 08/09/24.    The ASCVD Risk score (Arnett DK, et al., 2019) failed to calculate for the following reasons:   The 2019 ASCVD risk score is only valid for ages 41 to 62    Assessment & Plan:   Problem List Items Addressed This Visit       Cardiovascular and Mediastinum   Essential hypertension - Primary (Chronic)   Patient currently maintained on metoprolol  12.5 mg twice daily and Lasix  3 times weekly.  Blood pressure controlled continue medication as prescribed      Relevant Orders   CBC   Comprehensive metabolic panel with GFR   Lipid panel   TSH     Digestive   Duodenal ulcer with hemorrhage   History of the same followed by GI currently maintained on Protonix  40 mg daily        Endocrine   Hyperparathyroidism (HCC)   History of elevated PTH and calcium .  Vitamin D  within normal limits.  Pending PTH and calcium  today      Relevant Orders   TSH   PTH, intact and calcium      Nervous and Auditory   Mild cognitive impairment with memory loss (Chronic)   Currently maintained on Namenda  milligrams twice daily      Essential tremor   Stable currently maintained on primidone       Relevant Orders   TSH     Other   Gait abnormality   Patient requires assistive device to get around      Chronic headaches   History of the same offered referral to neurology patient and patient's daughter politely declined      Pacemaker   History of same currently followed by cardiology patient is also maintained on a beta-blocker.       Return in about 6  months (around 02/09/2025) for BP/Memory/Mood .    Regina Crandall, NP

## 2024-08-09 NOTE — Assessment & Plan Note (Signed)
 History of the same followed by GI currently maintained on Protonix  40 mg daily

## 2024-08-09 NOTE — Assessment & Plan Note (Signed)
 Stable currently maintained on primidone 

## 2024-08-09 NOTE — Assessment & Plan Note (Signed)
 Patient currently maintained on metoprolol 12.5 mg twice daily and Lasix 3 times weekly.  Blood pressure controlled continue medication as prescribed

## 2024-08-09 NOTE — Assessment & Plan Note (Signed)
 History of same currently followed by cardiology patient is also maintained on a beta-blocker.

## 2024-08-09 NOTE — Patient Instructions (Signed)
Nice to see you today I will be in touch with the labs once I have them Follow up with me in 6 months, sooner if you need me 

## 2024-08-09 NOTE — Assessment & Plan Note (Signed)
 History of elevated PTH and calcium .  Vitamin D  within normal limits.  Pending PTH and calcium  today

## 2024-08-09 NOTE — Assessment & Plan Note (Signed)
 Currently maintained on Namenda  milligrams twice daily

## 2024-08-09 NOTE — Assessment & Plan Note (Signed)
 History of the same offered referral to neurology patient and patient's daughter politely declined

## 2024-08-10 ENCOUNTER — Ambulatory Visit: Payer: Self-pay | Admitting: Family

## 2024-08-10 LAB — PTH, INTACT AND CALCIUM
Calcium: 10.3 mg/dL (ref 8.6–10.4)
PTH: 39 pg/mL (ref 16–77)

## 2024-08-13 ENCOUNTER — Ambulatory Visit (INDEPENDENT_AMBULATORY_CARE_PROVIDER_SITE_OTHER): Payer: Self-pay | Admitting: Urology

## 2024-08-13 VITALS — BP 186/107 | Wt 155.0 lb

## 2024-08-13 DIAGNOSIS — N3020 Other chronic cystitis without hematuria: Secondary | ICD-10-CM | POA: Diagnosis not present

## 2024-08-13 DIAGNOSIS — N3946 Mixed incontinence: Secondary | ICD-10-CM

## 2024-08-13 LAB — URINALYSIS, COMPLETE
Bilirubin, UA: NEGATIVE
Glucose, UA: NEGATIVE
Ketones, UA: NEGATIVE
Leukocytes,UA: NEGATIVE
Nitrite, UA: NEGATIVE
Protein,UA: NEGATIVE
Specific Gravity, UA: 1.02 (ref 1.005–1.030)
Urobilinogen, Ur: 0.2 mg/dL (ref 0.2–1.0)
pH, UA: 6 (ref 5.0–7.5)

## 2024-08-13 LAB — MICROSCOPIC EXAMINATION

## 2024-08-13 MED ORDER — NITROFURANTOIN MACROCRYSTAL 100 MG PO CAPS: 100.0000 mg | ORAL_CAPSULE | Freq: Every day | ORAL | 3 refills | Status: DC

## 2024-08-13 NOTE — Progress Notes (Signed)
 08/13/2024 8:26 AM   Regina Hernandez [birth year] [id number]  Referring provider: Wendee Lynwood HERO, NP [address] [place],  [place] [address]  No chief complaint on file.   HPI: Reviewed chart.  Patient with recurrent urinary tract infections.  1 year ago infection free on Macrodantin .  Clinically not infected.  Frequency stable.  Very pleased.  Today Frequency stable.  Twice she thought she was getting a UTI but the symptoms settle down with fluids.  No infections on daily Macrodantin  and very pleased        PMH: Past Medical History:  Diagnosis Date   Anxiety    Cervical dystonia    Essential tremor    GI bleed    Memory loss    Migraine    Occipital neuralgia     Surgical History: Past Surgical History:  Procedure Laterality Date   BOWEL RESECTION     CARDIAC CATHETERIZATION  2016   no stents/[facility]   ESOPHAGOGASTRODUODENOSCOPY N/A 06/18/2020   Procedure: ESOPHAGOGASTRODUODENOSCOPY (EGD);  Surgeon: Toledo, Ladell POUR, MD;  Location: [facility] ENDOSCOPY;  Service: Gastroenterology;  Laterality: N/A;   ESOPHAGOGASTRODUODENOSCOPY N/A 08/29/2022   Procedure: ESOPHAGOGASTRODUODENOSCOPY (EGD);  Surgeon: Stacia Glendia BRAVO, MD;  Location: [facility] ENDOSCOPY;  Service: Gastroenterology;  Laterality: N/A;   ESOPHAGOGASTRODUODENOSCOPY N/A 08/27/2022   Procedure: ESOPHAGOGASTRODUODENOSCOPY (EGD);  Surgeon: Toledo, Ladell POUR, MD;  Location: [facility] ENDOSCOPY;  Service: Gastroenterology;  Laterality: N/A;   ESOPHAGOGASTRODUODENOSCOPY (EGD) WITH PROPOFOL  N/A 08/28/2022   Procedure: ESOPHAGOGASTRODUODENOSCOPY (EGD) WITH PROPOFOL ;  Surgeon: Toledo, Ladell POUR, MD;  Location: [facility] ENDOSCOPY;  Service: Gastroenterology;  Laterality: N/A;   IR ANGIOGRAM VISCERAL SELECTIVE  08/29/2022   IR ANGIOGRAM VISCERAL SELECTIVE  08/29/2022   IR ANGIOGRAM VISCERAL SELECTIVE  08/29/2022   IR AORTAGRAM ABDOMINAL SERIALOGRAM  08/29/2022   IR EMBO ART  VEN HEMORR LYMPH EXTRAV  INC GUIDE ROADMAPPING   08/29/2022   IR US  GUIDE VASC ACCESS RIGHT  08/29/2022   PACEMAKER IMPLANT     REPLACEMENT TOTAL KNEE Left     Home Medications:  Allergies as of 08/13/2024       Reactions   Adhesive [tape] Other (See Comments)   Contact dermatitis   Baclofen Rash   Lyrica [pregabalin] Other (See Comments)   Unable to remember reaction.   Sulfa Antibiotics Rash        Medication List        Accurate as of August 13, 2024  8:26 AM. If you have any questions, ask your nurse or doctor.          acetaminophen  500 MG tablet Commonly known as: TYLENOL  Take 1,000 mg by mouth 2 (two) times daily.   ARIPiprazole  5 MG tablet Commonly known as: ABILIFY  Take 5 mg by mouth every morning.   atorvastatin  10 MG tablet Commonly known as: LIPITOR TAKE 1 TABLET BY MOUTH EVERY DAY   busPIRone  10 MG tablet Commonly known as: BUSPAR  Take 10 mg by mouth 2 (two) times daily.   cholecalciferol  25 MCG (1000 UNIT) tablet Commonly known as: VITAMIN D3 Take 1,000 Units by mouth daily.   docusate sodium  100 MG capsule Commonly known as: COLACE Take 100 mg by mouth daily as needed for mild constipation.   Effexor  XR 150 MG 24 hr capsule Generic drug: venlafaxine  XR Take 150 mg by mouth daily with breakfast.   furosemide  20 MG tablet Commonly known as: LASIX  TAKE 1 TABLET BY MOUTH 3 TIMES PER WEEK   gabapentin  400 MG capsule  Commonly known as: NEURONTIN  TAKE 1 CAPSULE BY MOUTH TWICE A DAY   LORazepam  0.5 MG tablet Commonly known as: ATIVAN  Take 0.5 mg by mouth 2 (two) times daily as needed.   memantine  10 MG tablet Commonly known as: NAMENDA  TAKE 1 TABLET BY MOUTH TWICE A DAY   metoprolol  tartrate 25 MG tablet Commonly known as: LOPRESSOR  Take 0.5 tablets (12.5 mg total) by mouth 2 (two) times daily.   nitrofurantoin  100 MG capsule Commonly known as: MACRODANTIN  Take 1 capsule (100 mg total) by mouth daily.   pantoprazole  40 MG tablet Commonly known as: PROTONIX  Take 1 tablet (40 mg  total) by mouth daily.   primidone  50 MG tablet Commonly known as: MYSOLINE  Take 100 mg by mouth daily.   vitamin B-12 500 MCG tablet Commonly known as: CYANOCOBALAMIN  Take 500 mcg by mouth daily.        Allergies:  Allergies  Allergen Reactions   Adhesive [Tape] Other (See Comments)    Contact dermatitis   Baclofen Rash   Lyrica [Pregabalin] Other (See Comments)    Unable to remember reaction.   Sulfa Antibiotics Rash    Family History: Family History  Problem Relation Age of Onset   Other Mother        old age   Ulcers Father    Stomach cancer Brother     Social History:  reports that she has never smoked. She has never been exposed to tobacco smoke. She has never used smokeless tobacco. She reports that she does not currently use alcohol. She reports that she does not use drugs.  ROS:                                        Physical Exam: There were no vitals taken for this visit.  Constitutional:  Alert and oriented, No acute distress. HEENT: [place] AT, moist mucus membranes.  Trachea midline, no masses.  Laboratory Data: Lab Results  Component Value Date   WBC 7.5 08/09/2024   HGB 13.2 08/09/2024   HCT 40.4 08/09/2024   MCV 93.3 08/09/2024   PLT 152.0 08/09/2024    Lab Results  Component Value Date   CREATININE 0.90 08/09/2024    No results found for: PSA  No results found for: TESTOSTERONE  No results found for: HGBA1C  Urinalysis    Component Value Date/Time   COLORURINE YELLOW (A) 06/02/2022 1921   APPEARANCEUR Clear 08/15/2023 0922   LABSPEC 1.013 06/02/2022 1921   PHURINE 6.0 06/02/2022 1921   GLUCOSEU Negative 08/15/2023 0922   HGBUR NEGATIVE 06/02/2022 1921   BILIRUBINUR Negative 08/15/2023 0922   KETONESUR NEGATIVE 06/02/2022 1921   PROTEINUR Negative 08/15/2023 0922   PROTEINUR NEGATIVE 06/02/2022 1921   NITRITE Negative 08/15/2023 0922   NITRITE NEGATIVE 06/02/2022 1921   LEUKOCYTESUR Negative  08/15/2023 0922   LEUKOCYTESUR NEGATIVE 06/02/2022 1921    Pertinent Imaging: Urine reviewed  Assessment & Plan: Renew Macrodantin  100 mg daily 90 x 3 and see in 1 year  1. Mixed incontinence (Primary)  - Urinalysis, Complete  2. Chronic cystitis  - Urinalysis, Complete   No follow-ups on file.  Glendia DELENA Elizabeth, MD  [facility] [address], [address] [place], [place] [address] [phone]

## 2024-08-16 LAB — CULTURE, URINE COMPREHENSIVE

## 2024-08-17 ENCOUNTER — Other Ambulatory Visit: Payer: Self-pay | Admitting: Nurse Practitioner

## 2024-09-18 ENCOUNTER — Encounter: Payer: Self-pay | Admitting: Urology

## 2024-09-18 ENCOUNTER — Encounter: Payer: Self-pay | Admitting: Internal Medicine

## 2024-09-18 ENCOUNTER — Encounter: Payer: Self-pay | Admitting: Cardiology

## 2024-09-18 ENCOUNTER — Encounter: Payer: Self-pay | Admitting: Nurse Practitioner

## 2024-09-19 NOTE — Telephone Encounter (Signed)
 Can we mark the patient as deceased

## 2024-09-21 ENCOUNTER — Telehealth: Payer: Self-pay

## 2024-09-21 NOTE — Telephone Encounter (Signed)
 Called [employer] and was able to confirm that they did receive signed death certificate.

## 2024-09-21 NOTE — Telephone Encounter (Signed)
Death certificate has been signed

## 2024-09-21 NOTE — Telephone Encounter (Signed)
 Copied from CRM #[id number]. Topic: Clinical - Medical Advice >> Sep 21, 2024  9:23 AM Anairis L wrote: Reason for CRM: [employer] from [employer] is calling in because he needs death certificate to be signed ASAP.   Any question please call [phone]

## 2024-09-26 DEATH — deceased

## 2024-10-08 NOTE — Progress Notes (Signed)
 Remote PPM Transmission

## 2025-02-15 ENCOUNTER — Ambulatory Visit: Admitting: Nurse Practitioner

## 2025-08-12 ENCOUNTER — Ambulatory Visit: Admitting: Urology
